# Patient Record
Sex: Female | Born: 1945 | Race: White | Hispanic: No | Smoking: Current every day smoker
Health system: Southern US, Community
[De-identification: ages and names within clinical notes are randomized; demographics above are authoritative.]

## PROBLEM LIST (undated history)

## (undated) DIAGNOSIS — G709 Myoneural disorder, unspecified: Secondary | ICD-10-CM

## (undated) DIAGNOSIS — R0600 Dyspnea, unspecified: Secondary | ICD-10-CM

## (undated) DIAGNOSIS — I2510 Atherosclerotic heart disease of native coronary artery without angina pectoris: Secondary | ICD-10-CM

## (undated) DIAGNOSIS — R7302 Impaired glucose tolerance (oral): Secondary | ICD-10-CM

## (undated) DIAGNOSIS — I219 Acute myocardial infarction, unspecified: Secondary | ICD-10-CM

## (undated) DIAGNOSIS — I10 Essential (primary) hypertension: Secondary | ICD-10-CM

## (undated) DIAGNOSIS — G35 Multiple sclerosis: Secondary | ICD-10-CM

## (undated) DIAGNOSIS — K5090 Crohn's disease, unspecified, without complications: Secondary | ICD-10-CM

## (undated) DIAGNOSIS — I639 Cerebral infarction, unspecified: Secondary | ICD-10-CM

## (undated) DIAGNOSIS — K219 Gastro-esophageal reflux disease without esophagitis: Secondary | ICD-10-CM

## (undated) DIAGNOSIS — J449 Chronic obstructive pulmonary disease, unspecified: Secondary | ICD-10-CM

## (undated) DIAGNOSIS — E7800 Pure hypercholesterolemia, unspecified: Secondary | ICD-10-CM

## (undated) DIAGNOSIS — Z720 Tobacco use: Secondary | ICD-10-CM

## (undated) DIAGNOSIS — E039 Hypothyroidism, unspecified: Secondary | ICD-10-CM

## (undated) DIAGNOSIS — C50919 Malignant neoplasm of unspecified site of unspecified female breast: Secondary | ICD-10-CM

## (undated) DIAGNOSIS — J45909 Unspecified asthma, uncomplicated: Secondary | ICD-10-CM

## (undated) DIAGNOSIS — E0590 Thyrotoxicosis, unspecified without thyrotoxic crisis or storm: Secondary | ICD-10-CM

## (undated) DIAGNOSIS — H409 Unspecified glaucoma: Secondary | ICD-10-CM

## (undated) HISTORY — DX: Cerebral infarction, unspecified: I63.9

## (undated) HISTORY — DX: Gastro-esophageal reflux disease without esophagitis: K21.9

## (undated) HISTORY — DX: Unspecified asthma, uncomplicated: J45.909

## (undated) HISTORY — DX: Thyrotoxicosis, unspecified without thyrotoxic crisis or storm: E05.90

## (undated) HISTORY — DX: Crohn's disease, unspecified, without complications: K50.90

## (undated) HISTORY — DX: Multiple sclerosis: G35

## (undated) HISTORY — DX: Unspecified glaucoma: H40.9

## (undated) HISTORY — DX: Pure hypercholesterolemia, unspecified: E78.00

## (undated) HISTORY — PX: CORONARY ANGIOPLASTY: SHX604

## (undated) HISTORY — PX: AUGMENTATION MAMMAPLASTY: SUR837

---
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
## 1977-05-16 HISTORY — PX: RIGHT OOPHORECTOMY: SHX2359

## 1979-05-17 HISTORY — PX: BREAST ENHANCEMENT SURGERY: SHX7

## 2004-07-07 ENCOUNTER — Ambulatory Visit: Payer: Self-pay | Admitting: Internal Medicine

## 2004-07-09 ENCOUNTER — Ambulatory Visit: Payer: Self-pay | Admitting: Internal Medicine

## 2005-05-25 ENCOUNTER — Ambulatory Visit: Payer: Self-pay | Admitting: Internal Medicine

## 2005-09-05 ENCOUNTER — Ambulatory Visit: Payer: Self-pay | Admitting: Unknown Physician Specialty

## 2007-05-09 ENCOUNTER — Ambulatory Visit: Payer: Self-pay | Admitting: Internal Medicine

## 2008-05-16 HISTORY — PX: COLONOSCOPY: SHX174

## 2008-12-24 ENCOUNTER — Ambulatory Visit: Payer: Self-pay | Admitting: Internal Medicine

## 2008-12-24 LAB — HM MAMMOGRAPHY

## 2009-11-13 ENCOUNTER — Ambulatory Visit: Payer: Self-pay | Admitting: Internal Medicine

## 2010-11-16 LAB — BASIC METABOLIC PANEL
BUN: 10 mg/dL (ref 4–21)
Glucose: 104 mg/dL

## 2010-11-16 LAB — CBC AND DIFFERENTIAL: HCT: 38 % (ref 36–46)

## 2010-11-16 LAB — HEPATIC FUNCTION PANEL: Alkaline Phosphatase: 84 U/L (ref 25–125)

## 2011-01-21 ENCOUNTER — Ambulatory Visit: Payer: Self-pay | Admitting: Internal Medicine

## 2012-04-25 ENCOUNTER — Telehealth: Payer: Self-pay | Admitting: Internal Medicine

## 2012-04-25 NOTE — Telephone Encounter (Signed)
Refill Levothyroxine 0.125 mcg. Called into [address].

## 2012-04-26 MED ORDER — LEVOTHYROXINE SODIUM 125 MCG PO TABS: 125.0000 ug | ORAL_TABLET | Freq: Every day | ORAL | Status: DC

## 2012-04-26 NOTE — Telephone Encounter (Signed)
Called prescription in to pharmacy 

## 2012-05-16 DIAGNOSIS — C50919 Malignant neoplasm of unspecified site of unspecified female breast: Secondary | ICD-10-CM

## 2012-05-16 HISTORY — DX: Malignant neoplasm of unspecified site of unspecified female breast: C50.919

## 2012-05-16 HISTORY — PX: MASTECTOMY: SHX3

## 2012-06-13 ENCOUNTER — Encounter: Payer: Self-pay | Admitting: *Deleted

## 2012-06-15 ENCOUNTER — Ambulatory Visit (INDEPENDENT_AMBULATORY_CARE_PROVIDER_SITE_OTHER): Payer: Self-pay | Admitting: Internal Medicine

## 2012-06-15 ENCOUNTER — Encounter: Payer: Self-pay | Admitting: Internal Medicine

## 2012-06-15 VITALS — BP 150/76 | HR 104 | Temp 98.6°F | Ht 67.0 in | Wt 219.8 lb

## 2012-06-15 DIAGNOSIS — L408 Other psoriasis: Secondary | ICD-10-CM

## 2012-06-15 DIAGNOSIS — R5381 Other malaise: Secondary | ICD-10-CM

## 2012-06-15 DIAGNOSIS — E7800 Pure hypercholesterolemia, unspecified: Secondary | ICD-10-CM

## 2012-06-15 DIAGNOSIS — G35 Multiple sclerosis: Secondary | ICD-10-CM

## 2012-06-15 DIAGNOSIS — L409 Psoriasis, unspecified: Secondary | ICD-10-CM

## 2012-06-15 DIAGNOSIS — H409 Unspecified glaucoma: Secondary | ICD-10-CM

## 2012-06-15 DIAGNOSIS — I10 Essential (primary) hypertension: Secondary | ICD-10-CM

## 2012-06-15 DIAGNOSIS — R5383 Other fatigue: Secondary | ICD-10-CM

## 2012-06-15 DIAGNOSIS — J45909 Unspecified asthma, uncomplicated: Secondary | ICD-10-CM

## 2012-06-15 DIAGNOSIS — R300 Dysuria: Secondary | ICD-10-CM

## 2012-06-15 DIAGNOSIS — E039 Hypothyroidism, unspecified: Secondary | ICD-10-CM

## 2012-06-15 DIAGNOSIS — G35D Multiple sclerosis, unspecified: Secondary | ICD-10-CM

## 2012-06-15 LAB — POCT URINALYSIS DIPSTICK
Bilirubin, UA: NEGATIVE
Blood, UA: NEGATIVE
Glucose, UA: NEGATIVE
Spec Grav, UA: 1.01
Urobilinogen, UA: 0.2
pH, UA: 6

## 2012-06-15 MED ORDER — ALBUTEROL SULFATE HFA 108 (90 BASE) MCG/ACT IN AERS: 2.0000 | INHALATION_SPRAY | Freq: Four times a day (QID) | RESPIRATORY_TRACT | Status: DC | PRN

## 2012-06-15 MED ORDER — LISINOPRIL 10 MG PO TABS: 10.0000 mg | ORAL_TABLET | Freq: Every day | ORAL | Status: DC

## 2012-06-15 MED ORDER — FLUTICASONE-SALMETEROL 250-50 MCG/DOSE IN AEPB: 1.0000 | INHALATION_SPRAY | Freq: Two times a day (BID) | RESPIRATORY_TRACT | Status: DC

## 2012-06-16 ENCOUNTER — Encounter: Payer: Self-pay | Admitting: Internal Medicine

## 2012-06-16 DIAGNOSIS — E7800 Pure hypercholesterolemia, unspecified: Secondary | ICD-10-CM | POA: Insufficient documentation

## 2012-06-16 DIAGNOSIS — H409 Unspecified glaucoma: Secondary | ICD-10-CM | POA: Insufficient documentation

## 2012-06-16 DIAGNOSIS — I10 Essential (primary) hypertension: Secondary | ICD-10-CM | POA: Insufficient documentation

## 2012-06-16 DIAGNOSIS — G35D Multiple sclerosis, unspecified: Secondary | ICD-10-CM | POA: Insufficient documentation

## 2012-06-16 DIAGNOSIS — J45909 Unspecified asthma, uncomplicated: Secondary | ICD-10-CM | POA: Insufficient documentation

## 2012-06-16 DIAGNOSIS — E039 Hypothyroidism, unspecified: Secondary | ICD-10-CM | POA: Insufficient documentation

## 2012-06-16 DIAGNOSIS — L409 Psoriasis, unspecified: Secondary | ICD-10-CM | POA: Insufficient documentation

## 2012-06-16 DIAGNOSIS — G35 Multiple sclerosis: Secondary | ICD-10-CM | POA: Insufficient documentation

## 2012-06-16 NOTE — Assessment & Plan Note (Signed)
Uses clobetosol.  Follow.

## 2012-06-16 NOTE — Assessment & Plan Note (Signed)
Followed by opthalmology.

## 2012-06-16 NOTE — Assessment & Plan Note (Signed)
Continue current inhaler regimen.  Needs to stop smoking.  Breathing stable.

## 2012-06-16 NOTE — Progress Notes (Signed)
Subjective:    Patient ID: Haley Santos, female    DOB: 1945-11-25, 67 y.o.   MRN: [id number]  HPI 67 year old female with past history of asthma, tobacco abuse, hypercholesterolemia, hyperthyroidism s/p ablation and now maintained on synthroid and multiple sclerosis.  She comes in today for a scheduled follow up.  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  Enjoys both of these.  Taking some vitamin supplements.  Feels better since being on these.  Still reports increased fatigue.  Started smoking again - 11/13.  She states that her MS is progressing.  Seeing Dr Dawna Part for her eye changes.  Some unsteadiness.  Decreased energy and decreased strength.  Hair is thinning.  States she had a bladder infection 10/13.  Has been taking cranberry pills and drinking cranberry juice.  Still has a sensitive sensation and some increased frequency.  Still having issues with her psoriasis.  Request a refill on clobetasol.     Past Medical History  Diagnosis Date  . Asthma with bronchitis   . Crohn's disease 1970  . Multiple sclerosis   . Hyperthyroidism     s/p ablation maintained on synthroid  . GERD (gastroesophageal reflux disease)   . Glaucoma   . Hypercholesterolemia     Current Outpatient Prescriptions on File Prior to Visit  Medication Sig Dispense Refill  . acetaminophen (TYLENOL) 325 MG tablet Take 325 mg by mouth as needed.      Marland Kitchen albuterol (PROAIR HFA) 108 (90 BASE) MCG/ACT inhaler Inhale 2 puffs into the lungs every 6 (six) hours as needed.  1 Inhaler  3  . aspirin 81 MG tablet Take 81 mg by mouth daily.      . Fluticasone-Salmeterol (ADVAIR) 250-50 MCG/DOSE AEPB Inhale 1 puff into the lungs every 12 (twelve) hours.  60 each  3  . levothyroxine (SYNTHROID, LEVOTHROID) 125 MCG tablet Take 1 tablet (125 mcg total) by mouth daily.  90 tablet  0  . lisinopril (PRINIVIL,ZESTRIL) 10 MG tablet Take 1 tablet (10 mg total) by mouth daily.  90 tablet  1  . omeprazole (PRILOSEC  OTC) 20 MG tablet Take 20 mg by mouth daily.      . travoprost, benzalkonium, (TRAVATAN) 0.004 % ophthalmic solution 1 drop at bedtime.      Marland Kitchen FLUoxetine (PROZAC) 10 MG capsule Take 10 mg by mouth daily.        Review of Systems Patient denies any headache, lightheadedness or dizziness. No significant sinus or allergy symptoms.  No chest pain, tightness or palpitations.  No increased shortness of breath.  No significant cough or congestion.  No nausea or vomiting.  No acid reflux.  No abdominal pain or cramping.  No bowel change, such as diarrhea, constipation, BRBPR or melana.  Urinary symptoms as outlined.  She did report not having her lisinopril for the last several days.  Fatigue as outlined.         Objective:   Physical Exam Filed Vitals:   06/15/12 0856  BP: 150/76  Pulse: 104  Temp: 98.6 F (37 C)   Blood pressure recheck:  70/76  67 year old female in no acute distress.   HEENT:  Nares- clear.  Oropharynx - without lesions. NECK:  Supple.  Nontender.  No audible bruit.  HEART:  Appears to be regular. LUNGS:  No crackles or wheezing audible.  Respirations even and unlabored.  RADIAL PULSE:  Equal bilaterally.  ABDOMEN:  Soft, nontender.  Bowel sounds present  and normal.  No audible abdominal bruit.    EXTREMITIES:  No increased edema present.  DP pulses palpable and equal bilaterally.          Assessment & Plan:  POSSIBLE UTI.  Check urinalysis.    FATIGUE.  Probably multifactorial.  See above.  Check cbc, met c and tsh.   SMOKING CESSATION.  Discussed the need to quit.  Follow.   HEALTH MAINTENANCE.  Schedule a physical at next visit.  Obtain outside records to review.  She declines mammogram.

## 2012-06-16 NOTE — Assessment & Plan Note (Signed)
Feels progressing.  See above.  Declines any further evaluation or treatment.

## 2012-06-16 NOTE — Assessment & Plan Note (Signed)
On thyroid supplementation.  Check tsh.

## 2012-06-16 NOTE — Assessment & Plan Note (Signed)
On lisinopril.  Has not had her medicine in several days.  Restart.  Follow pressures and send in.  Hold on making adjustments in her medication regimen.  Check metabolic panel.

## 2012-06-16 NOTE — Assessment & Plan Note (Signed)
Declines medication.  Low cholesterol diet and exercise.  Check lipid panel.

## 2012-06-22 ENCOUNTER — Other Ambulatory Visit: Payer: Self-pay

## 2012-06-26 ENCOUNTER — Other Ambulatory Visit: Payer: Self-pay

## 2012-07-17 ENCOUNTER — Ambulatory Visit (INDEPENDENT_AMBULATORY_CARE_PROVIDER_SITE_OTHER): Payer: BC Managed Care – PPO | Admitting: Adult Health

## 2012-07-17 ENCOUNTER — Encounter: Payer: Self-pay | Admitting: Adult Health

## 2012-07-17 ENCOUNTER — Telehealth: Payer: Self-pay | Admitting: Internal Medicine

## 2012-07-17 VITALS — BP 144/73 | HR 93 | Temp 98.0°F | Resp 16 | Ht 66.0 in | Wt 219.0 lb

## 2012-07-17 MED ORDER — PREDNISONE (PAK) 10 MG PO TABS: 10.0000 mg | ORAL_TABLET | Freq: Every day | ORAL | Status: DC

## 2012-07-17 MED ORDER — AMOXICILLIN-POT CLAVULANATE 875-125 MG PO TABS: 1.0000 | ORAL_TABLET | Freq: Two times a day (BID) | ORAL | Status: DC

## 2012-07-17 NOTE — Patient Instructions (Addendum)
  Please start your antibiotic today. Also start your prednisone in the morning.  You can use the flonase 1 spray in each nostril twice a day.  Call if you are not better within 3-4 days.

## 2012-07-17 NOTE — Assessment & Plan Note (Signed)
Suspect bacterial in origin. Her symptoms are further aggravated with her smoking. I have counseled her on smoking cessation; however, she is not ready to give this up. Will start Augmentin for 7 days. I have also ordered a prednisone taper starting with 60 mg and tapering by 10 mg until complete.

## 2012-07-17 NOTE — Progress Notes (Signed)
  Subjective:    Patient ID: Haley Santos, female    DOB: 01-13-1946, 67 y.o.   MRN: [id number]  HPI  Patient is a 67 year old female who presents to clinic with complaints of upper respiratory infection s/s which have progressed. Her symptoms started with sneezing, cough. She had some nausea and several episodes of vomiting. Now with sinus pressure, congestion and drainage which is green. She reports her breathing has become slightly impaired. She denies any fever or chills. Patient is currently smoking one pack per day.   Current Outpatient Prescriptions on File Prior to Visit  Medication Sig Dispense Refill  . albuterol (PROAIR HFA) 108 (90 BASE) MCG/ACT inhaler Inhale 2 puffs into the lungs every 6 (six) hours as needed.  1 Inhaler  3  . aspirin 81 MG tablet Take 81 mg by mouth daily.      . Fluticasone-Salmeterol (ADVAIR) 250-50 MCG/DOSE AEPB Inhale 1 puff into the lungs every 12 (twelve) hours.  60 each  3  . levothyroxine (SYNTHROID, LEVOTHROID) 125 MCG tablet Take 1 tablet (125 mcg total) by mouth daily.  90 tablet  0  . lisinopril (PRINIVIL,ZESTRIL) 10 MG tablet Take 1 tablet (10 mg total) by mouth daily.  90 tablet  1  . omeprazole (PRILOSEC OTC) 20 MG tablet Take 20 mg by mouth daily.      . travoprost, benzalkonium, (TRAVATAN) 0.004 % ophthalmic solution 1 drop at bedtime.      Marland Kitchen acetaminophen (TYLENOL) 325 MG tablet Take 325 mg by mouth as needed.       No current facility-administered medications on file prior to visit.      Review of Systems  Constitutional: Negative for fever, chills and appetite change.  HENT: Positive for congestion, voice change, postnasal drip and sinus pressure.   Respiratory: Positive for cough, chest tightness, shortness of breath and wheezing.   Cardiovascular: Negative for chest pain.  Gastrointestinal: Negative for nausea and vomiting.    BP 144/73  Pulse 93  Temp(Src) 98 F (36.7 C)  Resp 16  Ht 5\' 6"  (1.676 m)  Wt 219 lb (99.338 kg)   BMI 35.36 kg/m2  SpO2 93%  LMP 06/15/1977     Objective:   Physical Exam  Constitutional: She is oriented to person, place, and time. She appears well-developed and well-nourished.  Cardiovascular: Normal rate, regular rhythm and normal heart sounds.   Pulmonary/Chest: Effort normal and breath sounds normal. She has no wheezes. She has no rales.  Lymphadenopathy:    She has no cervical adenopathy.  Neurological: She is alert and oriented to person, place, and time.  Skin: Skin is warm and dry.  Psychiatric: She has a normal mood and affect. Her behavior is normal. Judgment and thought content normal.        Assessment & Plan:

## 2012-07-17 NOTE — Telephone Encounter (Signed)
Patient Information:  Caller Name: Chava  Phone: [phone]  Patient: Haley Santos, Haley Santos  Gender: Female  DOB: 04-09-1946  Age: 67 Years  PCP: Einar Pheasant  Office Follow Up:  Does the office need to follow up with this patient?: No  Instructions For The Office: N/A  RN Note:  Patient states she has cough, sinus pain and pressure, along with chest congestion.  States usually has augmentin for treatment and steroids.  Afebrile.  Denies wheezing, asthma flare, or other emergent symptoms per protocol; advised being seen today in office.  Appt scheduled 1615 07/17/12 with Raquel Rey.  krs/can  Symptoms  Reason For Call & Symptoms: sinus infection, possible bronchitis  Reviewed Health History In EMR: Yes  Reviewed Medications In EMR: Yes  Reviewed Allergies In EMR: Yes  Reviewed Surgeries / Procedures: Yes  Date of Onset of Symptoms: 07/11/2012  Guideline(s) Used:  Cough  Disposition Per Guideline:   See Today in Office  Reason For Disposition Reached:   Sinus pain persists after using nasal washes and pain medicine > 24 hours  Advice Given:  N/A  Appointment Scheduled:  07/17/2012 16:15:00 Appointment Scheduled Provider:  Charolette Forward

## 2012-08-01 ENCOUNTER — Telehealth: Payer: Self-pay | Admitting: *Deleted

## 2012-08-01 NOTE — Telephone Encounter (Signed)
Patient mailed in handicap tag form for Dr. Lorin Picket to fill out. Dr. Lorin Picket completed and I mailed back to patient per her request.

## 2012-08-06 ENCOUNTER — Ambulatory Visit (INDEPENDENT_AMBULATORY_CARE_PROVIDER_SITE_OTHER): Payer: BC Managed Care – PPO | Admitting: Internal Medicine

## 2012-08-06 ENCOUNTER — Encounter: Payer: Self-pay | Admitting: Internal Medicine

## 2012-08-06 VITALS — BP 130/70 | HR 91 | Temp 98.0°F | Ht 67.0 in | Wt 218.0 lb

## 2012-08-06 DIAGNOSIS — E7800 Pure hypercholesterolemia, unspecified: Secondary | ICD-10-CM

## 2012-08-06 DIAGNOSIS — E039 Hypothyroidism, unspecified: Secondary | ICD-10-CM

## 2012-08-06 DIAGNOSIS — J329 Chronic sinusitis, unspecified: Secondary | ICD-10-CM

## 2012-08-06 DIAGNOSIS — I10 Essential (primary) hypertension: Secondary | ICD-10-CM

## 2012-08-06 DIAGNOSIS — H409 Unspecified glaucoma: Secondary | ICD-10-CM

## 2012-08-06 DIAGNOSIS — G35 Multiple sclerosis: Secondary | ICD-10-CM

## 2012-08-06 DIAGNOSIS — D649 Anemia, unspecified: Secondary | ICD-10-CM

## 2012-08-06 DIAGNOSIS — G4710 Hypersomnia, unspecified: Secondary | ICD-10-CM

## 2012-08-06 DIAGNOSIS — R400 Somnolence: Secondary | ICD-10-CM

## 2012-08-06 DIAGNOSIS — J45909 Unspecified asthma, uncomplicated: Secondary | ICD-10-CM

## 2012-08-06 LAB — CBC WITH DIFFERENTIAL/PLATELET
Basophils Relative: 0.4 % (ref 0.0–3.0)
HCT: 34.1 % — ABNORMAL LOW (ref 36.0–46.0)
Hemoglobin: 11.7 g/dL — ABNORMAL LOW (ref 12.0–15.0)
Lymphocytes Relative: 18.4 % (ref 12.0–46.0)
MCHC: 34.3 g/dL (ref 30.0–36.0)
Monocytes Relative: 5 % (ref 3.0–12.0)
Neutro Abs: 4.5 10*3/uL (ref 1.4–7.7)
WBC: 6.3 10*3/uL (ref 4.5–10.5)

## 2012-08-06 MED ORDER — AMOXICILLIN-POT CLAVULANATE 875-125 MG PO TABS: 1.0000 | ORAL_TABLET | Freq: Two times a day (BID) | ORAL | Status: DC

## 2012-08-07 ENCOUNTER — Encounter: Payer: Self-pay | Admitting: Internal Medicine

## 2012-08-07 ENCOUNTER — Other Ambulatory Visit: Payer: Self-pay | Admitting: Internal Medicine

## 2012-08-07 DIAGNOSIS — D649 Anemia, unspecified: Secondary | ICD-10-CM

## 2012-08-07 NOTE — Progress Notes (Signed)
Subjective:    Patient ID: Haley Santos, female    DOB: 12-Apr-1946, 67 y.o.   MRN: [id number]  HPI 67 year old female with past history of asthma, tobacco abuse, hypercholesterolemia, hyperthyroidism s/p ablation and now maintained on synthroid and multiple sclerosis.  She comes in today as a work in for an acute care follow up.  She was seen on 07/31/12 for light headedness and dizziness.  States she feels more unsteady on her feet.  This has been going on for a while.  She relates some of her symptoms to MS.  She also recently has been treated for a sinus infection. states she is not over this.  Still with increased nasal/head congestion.  Bloody mucus (nasal).  Increased throat congestion.  Cough productive - thick mucus.  No deep chest congestion or tightness.  She also reports that she is sleepy.  Cannot stay awake through the day.  Increased fatigue.  She was found to be anemic (new onset) at acute care.  Started on iron.  Energy is some better.  No increased acid reflux.  No bowel change.     Past Medical History  Diagnosis Date  . Asthma with bronchitis   . Crohn's disease 1970  . Multiple sclerosis   . Hyperthyroidism     s/p ablation maintained on synthroid  . GERD (gastroesophageal reflux disease)   . Glaucoma   . Hypercholesterolemia     Current Outpatient Prescriptions on File Prior to Visit  Medication Sig Dispense Refill  . albuterol (PROAIR HFA) 108 (90 BASE) MCG/ACT inhaler Inhale 2 puffs into the lungs every 6 (six) hours as needed.  1 Inhaler  3  . Fluticasone-Salmeterol (ADVAIR) 250-50 MCG/DOSE AEPB Inhale 1 puff into the lungs every 12 (twelve) hours.  60 each  3  . levothyroxine (SYNTHROID, LEVOTHROID) 125 MCG tablet Take 1 tablet (125 mcg total) by mouth daily.  90 tablet  0  . lisinopril (PRINIVIL,ZESTRIL) 10 MG tablet Take 1 tablet (10 mg total) by mouth daily.  90 tablet  1  . omeprazole (PRILOSEC OTC) 20 MG tablet Take 20 mg by mouth daily.      . travoprost,  benzalkonium, (TRAVATAN) 0.004 % ophthalmic solution 1 drop at bedtime.      Marland Kitchen aspirin 81 MG tablet Take 81 mg by mouth daily.      . fluticasone (FLONASE) 50 MCG/ACT nasal spray Place 2 sprays into the nose daily.      . predniSONE (STERAPRED UNI-PAK) 10 MG tablet Take 1 tablet (10 mg total) by mouth daily. Take 60 mg on the first day then taper by 10 mg daily until complete  21 tablet  0   No current facility-administered medications on file prior to visit.    Review of Systems Patient denies any headache or dizziness. She did report some previous light headedness.  Still with increased sinus/nasal congestion with productive mucus.  See above.  No chest pain, tightness or palpitations.  No increased shortness of breath.  Some cough.  No nausea or vomiting.  No acid reflux. No abdominal pain or cramping.  No bowel change, such as diarrhea, constipation, BRBPR or melana.  Unsteadiness - as outlined.  Not new.  Has MS.  Has declined any form of evaluation or treatment for her MS.         Objective:   Physical Exam  Filed Vitals:   08/06/12 0851  BP: 130/70  Pulse: 91  Temp: 98 F (36.7 C)   Blood  pressure recheck:  59/76  66 year old female in no acute distress.   HEENT:  Nares- slightly erythematous turbinates.  Minimal tenderness to palpation over the frontal sinus.  TMs visualized - without erythema.   Oropharynx - without lesions. NECK:  Supple.  Nontender.   HEART:  Appears to be regular. LUNGS:  No crackles or wheezing audible.  Respirations even and unlabored.  No increased wheezing with forced expiration.   RADIAL PULSE:  Equal bilaterally.  ABDOMEN:  Soft, nontender.  Bowel sounds present and normal.  No audible abdominal bruit.    EXTREMITIES:  No increased edema present.  DP pulses palpable and equal bilaterally.          Assessment & Plan:  SINUSITIS.  Improved, but persistent symptoms.  Will continue augmentin 865m bid x 1 more week.  Use Flonase and saline nasal  spray as directed.  Continue inhalers as directed.  Notify me or be reevaluated if symptoms persist.    POSSIBLE SLEEP APNEA.  Describes the daytime somnolence as outlined.  Increased fatigue.  Hypertensive.  Schedule a split night sleep study.  Avoid sedating medications.    ANEMIA.  New finding for her.  No bowel change and no upper symptoms.  On iron now.  Started at acute care.  Recheck cbc/ferritin today.  Refer to GI for evaluation and further w/up - for the anemia.    LIGHT HEADEDNESS.  Probably multifactorial.  Will treat the sinus infection as outlined.  On iron now for the anemia.  Has MS.  Declines further w/up or treatment.  Declines scan, referral, etc.  Will notify me if she changes her mind.    SMOKING CESSATION.  Have discussed the need to quit.  Follow.   HEALTH MAINTENANCE.  Scheduled for a physical at next visit.  She declines mammogram.

## 2012-08-07 NOTE — Progress Notes (Signed)
Order placed for follow up labs

## 2012-08-07 NOTE — Assessment & Plan Note (Signed)
On thyroid supplementation.  TSH just checked 07/31/12 - within normal limits.

## 2012-08-07 NOTE — Assessment & Plan Note (Signed)
Followed by opthalmology.

## 2012-08-07 NOTE — Assessment & Plan Note (Signed)
Declines medication.  Low cholesterol diet and exercise.  Desires for me not to check her cholesterol.

## 2012-08-07 NOTE — Assessment & Plan Note (Signed)
Extend abx (augmentin) for another week.  Use the Flonase and saline nasal spray as outlined.  Mucinex as outlined.  Continue inhalers.  Follow.

## 2012-08-07 NOTE — Assessment & Plan Note (Signed)
On lisinopril.  Pressure a little elevated today.  Hold on making changes. Follow pressures.

## 2012-08-07 NOTE — Assessment & Plan Note (Signed)
Feels progressing.  See note as outlined.   Declines any further evaluation or treatment.

## 2012-08-07 NOTE — Assessment & Plan Note (Signed)
Continue current inhaler regimen.  Needs to stop smoking.  Breathing stable.  Treat the sinus infection as outlined.

## 2012-08-17 ENCOUNTER — Other Ambulatory Visit: Payer: Self-pay | Admitting: *Deleted

## 2012-08-18 ENCOUNTER — Telehealth: Payer: Self-pay | Admitting: Internal Medicine

## 2012-08-18 MED ORDER — LEVOTHYROXINE SODIUM 125 MCG PO TABS: 125.0000 ug | ORAL_TABLET | Freq: Every day | ORAL | Status: DC

## 2012-08-18 NOTE — Telephone Encounter (Signed)
Called in refill for levothyroxine #90 with on refill.  Also ok'd to change manufacturer.  Called in to [facility] [address].  (pharmacy that requested refill)

## 2012-08-24 ENCOUNTER — Ambulatory Visit: Payer: Self-pay | Admitting: Internal Medicine

## 2012-08-28 ENCOUNTER — Telehealth: Payer: Self-pay | Admitting: *Deleted

## 2012-08-28 NOTE — Telephone Encounter (Signed)
Pt informed that Sleep Study showed no OSA

## 2012-09-12 ENCOUNTER — Other Ambulatory Visit (INDEPENDENT_AMBULATORY_CARE_PROVIDER_SITE_OTHER): Payer: BC Managed Care – PPO

## 2012-09-12 DIAGNOSIS — R5383 Other fatigue: Secondary | ICD-10-CM

## 2012-09-12 DIAGNOSIS — E7800 Pure hypercholesterolemia, unspecified: Secondary | ICD-10-CM

## 2012-09-12 DIAGNOSIS — R5381 Other malaise: Secondary | ICD-10-CM

## 2012-09-12 DIAGNOSIS — D649 Anemia, unspecified: Secondary | ICD-10-CM

## 2012-09-12 DIAGNOSIS — E039 Hypothyroidism, unspecified: Secondary | ICD-10-CM

## 2012-09-12 LAB — LIPID PANEL
HDL: 33.5 mg/dL — ABNORMAL LOW (ref >39.00)
VLDL: 31.2 mg/dL (ref 0.0–40.0)

## 2012-09-12 LAB — COMPREHENSIVE METABOLIC PANEL
ALT: 44 U/L — ABNORMAL HIGH (ref 0–35)
Albumin: 3.8 g/dL (ref 3.5–5.2)
CO2: 25 mEq/L (ref 19–32)
GFR: 75.48 mL/min (ref >60.00)
Glucose, Bld: 160 mg/dL — ABNORMAL HIGH (ref 70–99)
Potassium: 4.4 mEq/L (ref 3.5–5.1)
Sodium: 139 mEq/L (ref 135–145)
Total Protein: 7.1 g/dL (ref 6.0–8.3)

## 2012-09-12 LAB — CBC WITH DIFFERENTIAL/PLATELET
Basophils Absolute: 0 10*3/uL (ref 0.0–0.1)
Eosinophils Relative: 0.4 % (ref 0.0–5.0)
Lymphocytes Relative: 12 % (ref 12.0–46.0)
Monocytes Relative: 2.5 % — ABNORMAL LOW (ref 3.0–12.0)
Neutrophils Relative %: 84.8 % — ABNORMAL HIGH (ref 43.0–77.0)
Platelets: 231 10*3/uL (ref 150.0–400.0)
RDW: 17.4 % — ABNORMAL HIGH (ref 11.5–14.6)
WBC: 8.7 10*3/uL (ref 4.5–10.5)

## 2012-09-12 LAB — TSH: TSH: 2.04 u[IU]/mL (ref 0.35–5.50)

## 2012-09-12 LAB — FERRITIN: Ferritin: 34.1 ng/mL (ref 10.0–291.0)

## 2012-09-13 LAB — LDL CHOLESTEROL, DIRECT: Direct LDL: 205.3 mg/dL

## 2012-09-14 ENCOUNTER — Encounter: Payer: Self-pay | Admitting: *Deleted

## 2012-09-14 ENCOUNTER — Other Ambulatory Visit: Payer: Self-pay | Admitting: Internal Medicine

## 2012-09-14 DIAGNOSIS — R739 Hyperglycemia, unspecified: Secondary | ICD-10-CM

## 2012-09-14 DIAGNOSIS — R748 Abnormal levels of other serum enzymes: Secondary | ICD-10-CM

## 2012-09-14 NOTE — Progress Notes (Signed)
Order placed for f/u labs.  

## 2012-09-18 ENCOUNTER — Ambulatory Visit (INDEPENDENT_AMBULATORY_CARE_PROVIDER_SITE_OTHER): Payer: BC Managed Care – PPO | Admitting: Internal Medicine

## 2012-09-18 ENCOUNTER — Other Ambulatory Visit (INDEPENDENT_AMBULATORY_CARE_PROVIDER_SITE_OTHER): Payer: BC Managed Care – PPO

## 2012-09-18 ENCOUNTER — Encounter: Payer: Self-pay | Admitting: Internal Medicine

## 2012-09-18 VITALS — BP 130/70 | HR 94 | Temp 98.4°F | Ht 67.0 in | Wt 210.2 lb

## 2012-09-18 DIAGNOSIS — L408 Other psoriasis: Secondary | ICD-10-CM

## 2012-09-18 DIAGNOSIS — J45909 Unspecified asthma, uncomplicated: Secondary | ICD-10-CM

## 2012-09-18 DIAGNOSIS — R739 Hyperglycemia, unspecified: Secondary | ICD-10-CM

## 2012-09-18 DIAGNOSIS — R748 Abnormal levels of other serum enzymes: Secondary | ICD-10-CM

## 2012-09-18 DIAGNOSIS — E7800 Pure hypercholesterolemia, unspecified: Secondary | ICD-10-CM

## 2012-09-18 DIAGNOSIS — E039 Hypothyroidism, unspecified: Secondary | ICD-10-CM

## 2012-09-18 DIAGNOSIS — R7309 Other abnormal glucose: Secondary | ICD-10-CM

## 2012-09-18 DIAGNOSIS — N63 Unspecified lump in unspecified breast: Secondary | ICD-10-CM

## 2012-09-18 DIAGNOSIS — L409 Psoriasis, unspecified: Secondary | ICD-10-CM

## 2012-09-18 DIAGNOSIS — G35 Multiple sclerosis: Secondary | ICD-10-CM

## 2012-09-18 DIAGNOSIS — R928 Other abnormal and inconclusive findings on diagnostic imaging of breast: Secondary | ICD-10-CM

## 2012-09-18 DIAGNOSIS — I10 Essential (primary) hypertension: Secondary | ICD-10-CM

## 2012-09-18 DIAGNOSIS — H409 Unspecified glaucoma: Secondary | ICD-10-CM

## 2012-09-18 LAB — HEMOGLOBIN A1C: Hgb A1c MFr Bld: 6.6 % — ABNORMAL HIGH (ref 4.6–6.5)

## 2012-09-18 LAB — HEPATIC FUNCTION PANEL
ALT: 42 U/L — ABNORMAL HIGH (ref 0–35)
Bilirubin, Direct: 0.1 mg/dL (ref 0.0–0.3)
Total Bilirubin: 0.9 mg/dL (ref 0.3–1.2)

## 2012-09-18 MED ORDER — FERROUS FUMARATE 325 (106 FE) MG PO TABS: 1.0000 | ORAL_TABLET | Freq: Every day | ORAL | Status: DC

## 2012-09-18 NOTE — Progress Notes (Signed)
Subjective:    Patient ID: Haley Santos, female    DOB: Mar 21, 1946, 67 y.o.   MRN: [id number]  HPI 67 year old female with past history of asthma, tobacco abuse, hypercholesterolemia, hyperthyroidism s/p ablation and now maintained on synthroid and multiple sclerosis.  She comes in today to follow up on these issues as well as for a complete physical exam.  She feels her breathing is back to baseline.  No increased cough or congestion now.  Sinus symptoms better.  No chest pain or tightness.  No nausea or vomiting.  No bowel change.  Recently saw GI for iron deficient anemia.  Discussed with them her desire to do a set of hemoccult cards and then decide about the colonoscopy.  She reports she has noticed a lump in her right breast.  Present now for three weeks.  She feels it may be getting smaller.  No nipple discharge.  We discussed her MS.  See last note for details.  Question of progression.  Has declined further w/up and treatment.    Past Medical History  Diagnosis Date  . Asthma with bronchitis   . Crohn's disease 1970  . Multiple sclerosis   . Hyperthyroidism     s/p ablation maintained on synthroid  . GERD (gastroesophageal reflux disease)   . Glaucoma   . Hypercholesterolemia     Current Outpatient Prescriptions on File Prior to Visit  Medication Sig Dispense Refill  . albuterol (PROAIR HFA) 108 (90 BASE) MCG/ACT inhaler Inhale 2 puffs into the lungs every 6 (six) hours as needed.  1 Inhaler  3  . aspirin 81 MG tablet Take 81 mg by mouth daily.      . Cyanocobalamin (VITAMIN B 12 PO) Take by mouth daily.      . Fluticasone-Salmeterol (ADVAIR) 250-50 MCG/DOSE AEPB Inhale 1 puff into the lungs every 12 (twelve) hours.  60 each  3  . levothyroxine (SYNTHROID, LEVOTHROID) 125 MCG tablet Take 1 tablet (125 mcg total) by mouth daily.  90 tablet  1  . lisinopril (PRINIVIL,ZESTRIL) 10 MG tablet Take 1 tablet (10 mg total) by mouth daily.  90 tablet  1  . magnesium 30 MG tablet Take 30  mg by mouth daily.      Marland Kitchen omeprazole (PRILOSEC OTC) 20 MG tablet Take 20 mg by mouth daily.      . travoprost, benzalkonium, (TRAVATAN) 0.004 % ophthalmic solution 1 drop at bedtime.      . fluticasone (FLONASE) 50 MCG/ACT nasal spray Place 2 sprays into the nose daily.       No current facility-administered medications on file prior to visit.    Review of Systems Patient denies any headache or dizziness. She did report some previous light headedness.  See last note for details.  No chest pain, tightness or palpitations.  No increased shortness of breath.  No significant cough.  Still smoking.  Discussed with her today regarding the need to stop smoking.  Discussed multiple treatment options.  No nausea or vomiting.  No abdominal pain or cramping.  No bowel change, such as diarrhea, constipation, BRBPR or melana.  Has MS.  Has declined any form of evaluation or treatment for her MS.  Right breast lump as outlined.       Objective:   Physical Exam  Filed Vitals:   09/18/12 1002  BP: 130/70  Pulse: 94  Temp: 98.4 F (57.71 C)   67 year old female in no acute distress.   HEENT:  Nares-  clear.  Oropharynx - without lesions. NECK:  Supple.  Nontender.  No audible bruit.  HEART:  Appears to be regular. LUNGS:  No crackles or wheezing audible.  Respirations even and unlabored.  RADIAL PULSE:  Equal bilaterally.    BREASTS:  No nipple discharge or nipple retraction present.  Right breast fullness 12:00-1:00.  No axillary adenopathy appreciated.  ABDOMEN:  Soft, nontender.  Bowel sounds present and normal.  No audible abdominal bruit.  GU:  She declined.  RECTAL:  She declined.    EXTREMITIES:  No increased edema present.  DP pulses palpable and equal bilaterally.          Assessment & Plan:  SINUSITIS.  Resolved.   POSSIBLE SLEEP APNEA.  Sleep study does not meet criteria for obstructive sleep apnea.  We did discuss not sleeping supine.     ANEMIA.  No bowel change and no upper  symptoms.  On iron now.  Most recent cbc wnl.  Saw GI.  Returned hemoccult cards.  F/u with GI planned.     LIGHT HEADEDNESS.  Probably multifactorial.   Has MS.  Has declined further w/up or treatment.  With intermittent headaches.  Discussed MRI.  She will think about this and notify me if agreeable.    SMOKING CESSATION.  Have discussed the need to quit. I again discussed with her today.  I also discussed treatment options.    HEALTH MAINTENANCE.  Physical today.  Schedule mammogram as outlined.  Seeing GI.

## 2012-09-19 ENCOUNTER — Telehealth: Payer: Self-pay | Admitting: Internal Medicine

## 2012-09-19 ENCOUNTER — Other Ambulatory Visit: Payer: Self-pay | Admitting: Internal Medicine

## 2012-09-19 ENCOUNTER — Ambulatory Visit: Payer: Self-pay | Admitting: Internal Medicine

## 2012-09-19 DIAGNOSIS — R945 Abnormal results of liver function studies: Secondary | ICD-10-CM

## 2012-09-19 DIAGNOSIS — R928 Other abnormal and inconclusive findings on diagnostic imaging of breast: Secondary | ICD-10-CM

## 2012-09-19 NOTE — Progress Notes (Signed)
Order placed for f/u liver test

## 2012-09-19 NOTE — Telephone Encounter (Signed)
Spoke with pt & informed her that the MRI would be with Contrast. Pt will call back to let us know which facility she would like to have it scheduled.

## 2012-09-19 NOTE — Telephone Encounter (Signed)
Pt called stated she found a big mri machine @ [facility].  Pt wanted to know if you were going to order the mri with contrast or without.    PT wanted to know this so she can call them back to get more information on the mri machine

## 2012-09-19 NOTE — Telephone Encounter (Signed)
Pt notified of mammo/us results - Birads 4 and need for referral to surgery for question of need for biopsy.  Prefers to see Dr Bary Castilla.  Order for referral made.  Pt also notified of her labs and need for abdominal ultrasound and repeat liver panel.  She wants to hol on any further testing at this time and get this sorted through with her breast.  She will call back for lab appt and to notify us when agreeable for ultrasound.  Also wants to hold on lifestyles referral.

## 2012-09-19 NOTE — Telephone Encounter (Signed)
I would like to do the mri with contrast.  Tell her to let us know where and we can get schedule.

## 2012-09-20 ENCOUNTER — Encounter: Payer: Self-pay | Admitting: Internal Medicine

## 2012-09-20 DIAGNOSIS — N63 Unspecified lump in unspecified breast: Secondary | ICD-10-CM | POA: Insufficient documentation

## 2012-09-20 NOTE — Assessment & Plan Note (Signed)
Uses clobetosol.  Follow.

## 2012-09-20 NOTE — Assessment & Plan Note (Signed)
Declines medication.  Low cholesterol diet and exercise.

## 2012-09-20 NOTE — Assessment & Plan Note (Signed)
Feels progressing.  See last note as outlined.   Declines any further evaluation or treatment at this time. Will notify me if she changes her mind.

## 2012-09-20 NOTE — Assessment & Plan Note (Signed)
Continue current inhaler regimen.  Needs to stop smoking.  Breathing stable.

## 2012-09-20 NOTE — Assessment & Plan Note (Signed)
On lisinopril.  Pressure ok.  Follow metabolic panel.

## 2012-09-20 NOTE — Assessment & Plan Note (Signed)
Right breast fullness and exam as outlined.  Will check mammogram and ultrasound.

## 2012-09-20 NOTE — Assessment & Plan Note (Signed)
Followed by opthalmology.

## 2012-09-20 NOTE — Assessment & Plan Note (Signed)
On thyroid supplementation.  Recent tsh wnl.

## 2012-09-28 ENCOUNTER — Other Ambulatory Visit: Payer: BC Managed Care – PPO

## 2012-10-03 ENCOUNTER — Other Ambulatory Visit: Payer: Self-pay

## 2012-10-03 ENCOUNTER — Ambulatory Visit (INDEPENDENT_AMBULATORY_CARE_PROVIDER_SITE_OTHER): Payer: BC Managed Care – PPO | Admitting: General Surgery

## 2012-10-03 ENCOUNTER — Encounter: Payer: Self-pay | Admitting: General Surgery

## 2012-10-03 VITALS — BP 152/70 | HR 80 | Resp 16 | Ht 66.0 in | Wt 208.0 lb

## 2012-10-03 DIAGNOSIS — N63 Unspecified lump in unspecified breast: Secondary | ICD-10-CM

## 2012-10-03 DIAGNOSIS — Z853 Personal history of malignant neoplasm of breast: Secondary | ICD-10-CM | POA: Insufficient documentation

## 2012-10-03 DIAGNOSIS — R928 Other abnormal and inconclusive findings on diagnostic imaging of breast: Secondary | ICD-10-CM

## 2012-10-03 NOTE — Progress Notes (Signed)
Patient ID: Haley Santos, female   DOB: [birth year], 67 y.o.   MRN: [id number]  Chief Complaint  Patient presents with  . Breast Problem    HPI Haley Santos is a 67 y.o. female here today for abnormal mammogran done on Sep 19 2012.  States she can feel the area of concern on the right breast and it is smaller than before and a little sore.  She first noticed the "knot" September 12 2012 while applying lotion.  States she has had a similar thing happen 42 years ago after the birth of her daughter and was anemic and placed on iron.  She started iron in March for anemia and was placed on iron and she had joint aches and the "lump" developed.  She decided to only take the iron once a week now.  Denies family history of breast cancer.  HPI  Past Medical History  Diagnosis Date  . Asthma with bronchitis   . Crohn's disease 1970  . Multiple sclerosis 1993  . Hyperthyroidism     s/p ablation maintained on synthroid  . GERD (gastroesophageal reflux disease)   . Glaucoma   . Hypercholesterolemia     Past Surgical History  Procedure Laterality Date  . Tonsillectomy and adenoidectomy  1957  . Right oophorectomy  1979  . Colonoscopy  2010    Dr Vira Agar  . Breast enhancement surgery  1981    [place]    Family History  Problem Relation Age of Onset  . Diabetes Mother   . Heart disease Mother     myocardial infarction  . Glaucoma Mother   . Graves' disease Mother   . Lung disease Father   . Heart disease      half sister  . Peripheral vascular disease      half sister  . Heart attack Mother 24    Social History History  Substance Use Topics  . Smoking status: Current Every Day Smoker  . Smokeless tobacco: Never Used  . Alcohol Use: Yes     Comment: rarely    Allergies  Allergen Reactions  . Clindamycin/Lincomycin   . Codeine   . Shellfish Allergy Nausea And Vomiting    Felt goofy headed  . Valium (Diazepam)     Current Outpatient Prescriptions  Medication Sig  Dispense Refill  . albuterol (PROAIR HFA) 108 (90 BASE) MCG/ACT inhaler Inhale 2 puffs into the lungs every 6 (six) hours as needed.  1 Inhaler  3  . aspirin 81 MG tablet Take 81 mg by mouth daily.      . cholecalciferol (VITAMIN D) 1000 UNITS tablet Take 2,000 Units by mouth daily.      . Cyanocobalamin (VITAMIN B 12 PO) Take by mouth daily.      . ferrous fumarate (HEMOCYTE - 106 MG FE) 325 (106 FE) MG TABS Take 1 tablet (106 mg of iron total) by mouth daily.  30 each  2  . fluticasone (FLONASE) 50 MCG/ACT nasal spray Place 2 sprays into the nose daily.      . Fluticasone-Salmeterol (ADVAIR) 250-50 MCG/DOSE AEPB Inhale 1 puff into the lungs every 12 (twelve) hours.  60 each  3  . levothyroxine (SYNTHROID, LEVOTHROID) 125 MCG tablet Take 1 tablet (125 mcg total) by mouth daily.  90 tablet  1  . lisinopril (PRINIVIL,ZESTRIL) 10 MG tablet Take 1 tablet (10 mg total) by mouth daily.  90 tablet  1  . magnesium 30 MG tablet Take 30 mg by mouth daily.      Marland Kitchen  mometasone (NASONEX) 50 MCG/ACT nasal spray Place 2 sprays into the nose as needed.      Marland Kitchen omeprazole (PRILOSEC OTC) 20 MG tablet Take 20 mg by mouth daily.      . travoprost, benzalkonium, (TRAVATAN) 0.004 % ophthalmic solution 1 drop at bedtime.       No current facility-administered medications for this visit.    Review of Systems Review of Systems  Constitutional: Negative.   Respiratory: Negative.   Cardiovascular: Negative.     Blood pressure 152/70, pulse 80, resp. rate 16, height 5' 6"  (1.676 m), weight 208 lb (94.348 kg), last menstrual period 06/15/1977.  Physical Exam Physical Exam  Constitutional: She is oriented to person, place, and time. She appears well-developed and well-nourished.  Cardiovascular: Normal rate and regular rhythm.   No lower leg edema  Pulmonary/Chest: Effort normal and breath sounds normal.  Abdominal: Soft.  Lymphadenopathy:    She has no cervical adenopathy.    She has no axillary adenopathy.   Neurological: She is alert and oriented to person, place, and time.  Skin: Skin is warm and dry.  2 cm thickening at 8 o'clock 6 CFN left breast. This area is fairly soft and may represent a ripple in the underlying implant. In the 12 o'clock position of the right breast, 5 CFN there is soft tender area right breast measuring about 3 cm in diameter. No overlying erythema, but mild tenderness with pressure.  Data Reviewed Bilateral mammograms dated 09/19/2012 were reviewed and compared to previous studies dated 12/24/2008. A new parenchymal densities noted in the upper aspect of the right breast. Lateral implants are identified. The left breast is unremarkable. Ultrasound examination of the right breast of the same date showed a 3.2 cm solid lesion with posterior shadowing. BI-[id number].  Ultrasound examination of the left breast showed a hypoechoic nodule measuring 0.5 x 0.7 x 0.9 cm at the 8:00 position 7 cm from the nipple. This appeared separate from the underlying envelope of the breast prosthesis. No acoustic shadowing or enhancement is appreciated. This is indeterminate.  Ultrasound examination of the right breast identified a 1.4 x 1.5 x 1.7 poorly defined spiculated hypoechoic mass with acoustic shadowing above the level of the implant.  The patient was amenable to vacuum biopsy. Initial skin prep was undertaken with alcohol followed by the injection of 10 cc of 0.5% Xylocaine with 0.25% Marcaine with 1 2000 units of epinephrine under ultrasound guidance to avoid the underlying implant. With initial passage of a 10-gauge Encor device the patient experienced significant pain and an additional 10 cc of 1% plain Xylocaine was instilled. Once the lesion had been transversed 3 core samples were obtained, with severe pain with each biopsy. The procedure was terminated at this point. A clip was not utilized as the dominant mass remained present. The skin defect was closed with benzoin and Steri-Strip.  Pain had abated by the end of the procedure. A Telfa and Tegaderm dressing was applied. Written instructions were provided for wound care.  Assessment    Right breast cancer until proven otherwise.    Plan    The patient was advised to be contacted when the pathology is available. If a benign result as reported she'll be encouraged to have formal excision of this area.  The left breast lesion will need to be addressed. She reported today that she would "never" have a vacuum biopsy in the future. If the right biopsy is indeed positive for malignancy will make arrangements for bilateral breast  MRI to assess the left breast lesion.         Robert Bellow 10/04/2012, 6:56 AM

## 2012-10-03 NOTE — Patient Instructions (Addendum)
CARE AFTER BREAST BIOPSY  1. Leave the dressing on that your doctor applied after surgery. It is waterproof. You may bathe, shower and/or swim. The dressing will probably remain intact until your return office visit. If the dressing comes off, you will see small strips of tape against your skin on the incision. Do not remove these strips.  2. You may want to use a gauze,cloth or similar protection in your bra to prevent rubbing against your dressing and incision. This is not necessary, but you may feel more comfortable doing so.  3. It is recommended that you wear a bra day and night to give support to the breast. This will prevent the weight of the breast from pulling on the incision.  4. Your breast will feel hard and lumpy under the incision. Do not be alarmed. This is the underlying stitching of tissue. Softening of this tissue will occur in time.  5. Make sure you call the office and schedule an appointment in one week after your surgery. The office phone number is [phone]. The [facility] at [facility] may have already done this for you.  6. You will notice about a week after your office visit that the strips of the tape on your incision will begin to loosen. These may then be removed.  7. Report to your doctor any of the following:  * Severe pain not relieved by your pain medication  *Redness of the incision  * Drainage from the incision  *Fever greater than 101 degrees  The patient is aware to call back for any questions or concerns. We will call with results of biopsy May need to have an MRI of breast

## 2012-10-04 ENCOUNTER — Encounter: Payer: Self-pay | Admitting: General Surgery

## 2012-10-04 ENCOUNTER — Ambulatory Visit (INDEPENDENT_AMBULATORY_CARE_PROVIDER_SITE_OTHER): Payer: BC Managed Care – PPO | Admitting: General Surgery

## 2012-10-04 DIAGNOSIS — D649 Anemia, unspecified: Secondary | ICD-10-CM

## 2012-10-04 DIAGNOSIS — C50911 Malignant neoplasm of unspecified site of right female breast: Secondary | ICD-10-CM

## 2012-10-04 DIAGNOSIS — C50919 Malignant neoplasm of unspecified site of unspecified female breast: Secondary | ICD-10-CM

## 2012-10-04 NOTE — Patient Instructions (Addendum)
Patient to have labs drawn and chest x-ray.

## 2012-10-04 NOTE — Progress Notes (Signed)
Patient to have the following labs drawn at [facility] lab today: B12, Folate, CEA, and CA 27-29.  This patient will also have a chest x-ray PA and lateral completed at [facility].   The nearest evaluated the biopsy site and showed no notable abnormalities. Mild bruising is appreciated.   The patient was contacted by phone with the results of her biopsy completed yesterday. 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 We spent the better part of 45 minutes reviewing options for management. She is much interested in breast conservation.   The issues at present are as follows:  1) the left contralateral lesion that is indeterminate on ultrasound.  2) the actual size of the lesion as there is discrepancy between the [facility] and office ultrasound. If it is indeed about 2 cm she would be a likely candidate for neoadjuvant chemotherapy  3) tolerance of asymmetry: Breast conservation on the right would very likely necessitate removal of the underlying prosthesis resulting in at least one cup size asymmetry plus that related to the volume of tissue removed with the malignancy and changes after radiation therapy. She may benefit from plastic surgery consultation in this regard.  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 All the options open for management should allow her to have full range of motion if she is still diligent and following postoperative instructions.   The patient carried a diagnosis of Crohn's disease, but has had no violation or intervention in decades. She also was told that she suffered from multiple sclerosis while living in order to, but except for occasional unsteadiness of gait she's had no interventions or investigations this regard for at least several years.  She reports that she been determined to be B12 deficient in the past. She's been making use of 2 mg of oral bleed. For several weeks. A  B12 level will be determined.  Arrangements were made for a breast MRI to assess for the actual tumor size of the right breast lesion and to better characterize the left breast lesion as she is not planning on having a biopsy under local anesthesia.  Showed chemotherapy be planned, a roll of power port placement was discussed.  Plans will follow up after the breast MRIs completed.

## 2012-10-05 LAB — CANCER ANTIGEN 27.29: CA 27.29: 40.8 U/mL — ABNORMAL HIGH (ref 0.0–38.6)

## 2012-10-05 LAB — FOLATE: Folate: 10.9 ng/mL (ref >3.0)

## 2012-10-05 LAB — VITAMIN B12: Vitamin B-12: 1212 pg/mL — ABNORMAL HIGH (ref 211–946)

## 2012-10-05 LAB — CEA: CEA: 4.1 ng/mL (ref 0.0–4.7)

## 2012-10-09 ENCOUNTER — Other Ambulatory Visit: Payer: Self-pay | Admitting: *Deleted

## 2012-10-09 ENCOUNTER — Ambulatory Visit: Payer: Self-pay | Admitting: General Surgery

## 2012-10-09 ENCOUNTER — Ambulatory Visit (INDEPENDENT_AMBULATORY_CARE_PROVIDER_SITE_OTHER): Payer: BC Managed Care – PPO | Admitting: *Deleted

## 2012-10-09 ENCOUNTER — Encounter: Payer: Self-pay | Admitting: Internal Medicine

## 2012-10-09 ENCOUNTER — Other Ambulatory Visit: Payer: Self-pay | Admitting: General Surgery

## 2012-10-09 ENCOUNTER — Encounter: Payer: Self-pay | Admitting: General Surgery

## 2012-10-09 DIAGNOSIS — C50919 Malignant neoplasm of unspecified site of unspecified female breast: Secondary | ICD-10-CM

## 2012-10-09 NOTE — Progress Notes (Signed)
Patient has been scheduled for a breast MRI at the [facility] for 10-19-12 at 12:15 pm. Patient made aware per the [facility].

## 2012-10-09 NOTE — Patient Instructions (Addendum)
The patient is aware that a heating pad may be used for comfort as needed.  MRI of breast, she has prescription for xanax Chest x ray today

## 2012-10-09 NOTE — Progress Notes (Signed)
Patient here today for follow up post breast biopsy.  Steri strip remains in place and aware it may come off in one week.  Minimal bruising noted.  The patient is aware that a heating pad may be used for comfort as needed.

## 2012-10-16 ENCOUNTER — Telehealth: Payer: Self-pay | Admitting: Internal Medicine

## 2012-10-16 MED ORDER — ALBUTEROL SULFATE HFA 108 (90 BASE) MCG/ACT IN AERS: 2.0000 | INHALATION_SPRAY | Freq: Four times a day (QID) | RESPIRATORY_TRACT | Status: DC | PRN

## 2012-10-16 NOTE — Telephone Encounter (Signed)
refilled 

## 2012-10-16 NOTE — Telephone Encounter (Signed)
albuterol (PROAIR HFA) 108 (90 BASE) MCG/ACT inhaler

## 2012-10-17 LAB — PATHOLOGY

## 2012-10-19 ENCOUNTER — Ambulatory Visit
Admission: RE | Admit: 2012-10-19 | Discharge: 2012-10-19 | Disposition: A | Discharge status: Home / Self Care | Payer: BC Managed Care – PPO | Source: Ambulatory Visit | Attending: General Surgery | Admitting: General Surgery

## 2012-10-19 DIAGNOSIS — C50919 Malignant neoplasm of unspecified site of unspecified female breast: Secondary | ICD-10-CM

## 2012-10-19 MED ORDER — GADOBENATE DIMEGLUMINE 529 MG/ML IV SOLN
20.0000 mL | Freq: Once | INTRAVENOUS | Status: AC | PRN
  Administered 2012-10-19: 20 mL via INTRAVENOUS

## 2012-10-22 ENCOUNTER — Telehealth: Payer: Self-pay | Admitting: *Deleted

## 2012-10-22 ENCOUNTER — Telehealth: Payer: Self-pay | Admitting: General Surgery

## 2012-10-22 ENCOUNTER — Other Ambulatory Visit: Payer: Self-pay | Admitting: *Deleted

## 2012-10-22 ENCOUNTER — Telehealth: Payer: Self-pay | Admitting: Internal Medicine

## 2012-10-22 DIAGNOSIS — C50919 Malignant neoplasm of unspecified site of unspecified female breast: Secondary | ICD-10-CM

## 2012-10-22 MED ORDER — FLUTICASONE-SALMETEROL 250-50 MCG/DOSE IN AEPB: 1.0000 | INHALATION_SPRAY | Freq: Two times a day (BID) | RESPIRATORY_TRACT | Status: DC

## 2012-10-22 NOTE — Telephone Encounter (Signed)
Message copied by Dominga Ferry on Mon Oct 22, 2012  4:28 PM ------      Message from: [place], [place] W      Created: Mon Oct 22, 2012 12:53 PM       Please arrange for patient evaluation with Dr. Kallie Edward at Cancer center re: neo-adjuvant chemotherapy. Patient is aware you will be calling. Thanks.       ------

## 2012-10-22 NOTE — Telephone Encounter (Signed)
Patient has been scheduled for an appointment with Dr. Wendie Simmer at the [facility] for 10-31-12 at 3 pm. Please note: Dr. Wendie Simmer is on vacation till 10-30-12. This patient is aware of all instructions.

## 2012-10-22 NOTE — Telephone Encounter (Signed)
Refilled per pt request.

## 2012-10-22 NOTE — Telephone Encounter (Signed)
Patient notified of MRI results: Left normal, right 2.6 cm lesion. No additional lesions.  Patient reports she wants to see Dr. Kallie Edward at cancer center.  Does not want to consider plastic surgery to reconstruct the right breast, is aware she will have significant volume discrepancy after wide excision and implant removal.  Reviewed indication for port placement.

## 2012-10-22 NOTE — Telephone Encounter (Signed)
Fluticasone-Salmeterol (ADVAIR) 250-50 MCG/DOSE AEPB

## 2012-10-31 ENCOUNTER — Other Ambulatory Visit: Payer: Self-pay | Admitting: General Surgery

## 2012-10-31 ENCOUNTER — Ambulatory Visit: Payer: Self-pay | Admitting: Hematology and Oncology

## 2012-10-31 DIAGNOSIS — C50911 Malignant neoplasm of unspecified site of right female breast: Secondary | ICD-10-CM

## 2012-11-07 ENCOUNTER — Ambulatory Visit: Payer: Self-pay | Admitting: General Surgery

## 2012-11-07 DIAGNOSIS — C50919 Malignant neoplasm of unspecified site of unspecified female breast: Secondary | ICD-10-CM

## 2012-11-07 HISTORY — PX: PORTACATH PLACEMENT: SHX2246

## 2012-11-08 ENCOUNTER — Encounter: Payer: Self-pay | Admitting: General Surgery

## 2012-11-08 LAB — CBC CANCER CENTER
Basophil #: 0 x10 3/mm (ref 0.0–0.1)
Basophil %: 0.4 %
Eosinophil #: 0.3 x10 3/mm (ref 0.0–0.7)
Eosinophil %: 4 %
HCT: 39.5 % (ref 35.0–47.0)
HGB: 13.7 g/dL (ref 12.0–16.0)
Lymphocyte #: 1.4 x10 3/mm (ref 1.0–3.6)
Lymphocyte %: 16.6 %
MCH: 27.9 pg (ref 26.0–34.0)
MCHC: 34.7 g/dL (ref 32.0–36.0)
MCV: 80 fL (ref 80–100)
Neutrophil #: 6.4 x10 3/mm (ref 1.4–6.5)
Neutrophil %: 74.2 %
Platelet: 202 x10 3/mm (ref 150–440)
RBC: 4.91 10*6/uL (ref 3.80–5.20)
RDW: 14.6 % — ABNORMAL HIGH (ref 11.5–14.5)

## 2012-11-08 LAB — COMPREHENSIVE METABOLIC PANEL
Co2: 27 mmol/L (ref 21–32)
Creatinine: 1.08 mg/dL (ref 0.60–1.30)
EGFR (Non-African Amer.): 54 — ABNORMAL LOW
Osmolality: 281 (ref 275–301)
SGOT(AST): 29 U/L (ref 15–37)
SGPT (ALT): 40 U/L (ref 12–78)
Total Protein: 7 g/dL (ref 6.4–8.2)

## 2012-11-12 ENCOUNTER — Encounter: Payer: Self-pay | Admitting: General Surgery

## 2012-11-13 ENCOUNTER — Ambulatory Visit: Payer: Self-pay | Admitting: Hematology and Oncology

## 2012-11-13 DIAGNOSIS — Z5111 Encounter for antineoplastic chemotherapy: Secondary | ICD-10-CM | POA: Diagnosis not present

## 2012-11-13 DIAGNOSIS — C50519 Malignant neoplasm of lower-outer quadrant of unspecified female breast: Secondary | ICD-10-CM | POA: Diagnosis not present

## 2012-11-13 DIAGNOSIS — R3915 Urgency of urination: Secondary | ICD-10-CM | POA: Diagnosis not present

## 2012-11-13 DIAGNOSIS — F17200 Nicotine dependence, unspecified, uncomplicated: Secondary | ICD-10-CM | POA: Diagnosis not present

## 2012-11-13 DIAGNOSIS — K5090 Crohn's disease, unspecified, without complications: Secondary | ICD-10-CM | POA: Diagnosis not present

## 2012-11-13 DIAGNOSIS — R5381 Other malaise: Secondary | ICD-10-CM | POA: Diagnosis not present

## 2012-11-13 DIAGNOSIS — R0602 Shortness of breath: Secondary | ICD-10-CM | POA: Diagnosis not present

## 2012-11-13 DIAGNOSIS — Z79899 Other long term (current) drug therapy: Secondary | ICD-10-CM | POA: Diagnosis not present

## 2012-11-13 DIAGNOSIS — R059 Cough, unspecified: Secondary | ICD-10-CM | POA: Diagnosis not present

## 2012-11-13 DIAGNOSIS — Z170 Estrogen receptor positive status [ER+]: Secondary | ICD-10-CM | POA: Diagnosis not present

## 2012-11-13 DIAGNOSIS — H538 Other visual disturbances: Secondary | ICD-10-CM | POA: Diagnosis not present

## 2012-11-13 DIAGNOSIS — E538 Deficiency of other specified B group vitamins: Secondary | ICD-10-CM | POA: Diagnosis not present

## 2012-11-13 DIAGNOSIS — G35 Multiple sclerosis: Secondary | ICD-10-CM | POA: Diagnosis not present

## 2012-11-13 DIAGNOSIS — Z7982 Long term (current) use of aspirin: Secondary | ICD-10-CM | POA: Diagnosis not present

## 2012-11-21 LAB — CBC CANCER CENTER
Basophil #: 0 x10 3/mm (ref 0.0–0.1)
Basophil %: 0.7 %
Eosinophil #: 0.1 x10 3/mm (ref 0.0–0.7)
Eosinophil %: 4.5 %
HCT: 37.5 % (ref 35.0–47.0)
HGB: 13.1 g/dL (ref 12.0–16.0)
Lymphocyte #: 0.8 x10 3/mm — ABNORMAL LOW (ref 1.0–3.6)
Lymphocyte %: 48.3 %
MCH: 28.1 pg (ref 26.0–34.0)
MCHC: 34.9 g/dL (ref 32.0–36.0)
MCV: 81 fL (ref 80–100)
Monocyte #: 0.3 x10 3/mm (ref 0.2–0.9)
Neutrophil #: 0.5 x10 3/mm — ABNORMAL LOW (ref 1.4–6.5)
Neutrophil %: 26.7 %
Platelet: 176 x10 3/mm (ref 150–440)
RDW: 13.9 % (ref 11.5–14.5)
WBC: 1.7 x10 3/mm — CL (ref 3.6–11.0)

## 2012-11-21 LAB — BASIC METABOLIC PANEL
Anion Gap: 9 (ref 7–16)
BUN: 10 mg/dL (ref 7–18)
Chloride: 103 mmol/L (ref 98–107)
Co2: 28 mmol/L (ref 21–32)
Creatinine: 0.99 mg/dL (ref 0.60–1.30)
EGFR (African American): >60
EGFR (Non-African Amer.): >60
Potassium: 4.3 mmol/L (ref 3.5–5.1)
Sodium: 140 mmol/L (ref 136–145)

## 2012-11-23 ENCOUNTER — Telehealth: Payer: Self-pay | Admitting: *Deleted

## 2012-11-23 NOTE — Telephone Encounter (Signed)
Patient called back to report that she was able to speak with someone from [facility] and it looks like she will only have to pay a CAD charge of $250. This patient states not to worry about needing to write a letter at this time. She will call back if we can be of further assistance.

## 2012-11-23 NOTE — Telephone Encounter (Signed)
Patient called concerned that her insurance did not pay for her recent breast MRI. This was pre-authorized by her insurance company but she was instructed this is not a guarantee of payment. This patient wishes to appeal the charges. She will be stopping by the office to drop off a copy of the letter she received from [facility]. Patient would appreciate it if you would be willing to write a letter to the insurance company because she does not have the money to pay for the test. Thanks.

## 2012-11-29 DIAGNOSIS — C50519 Malignant neoplasm of lower-outer quadrant of unspecified female breast: Secondary | ICD-10-CM | POA: Diagnosis not present

## 2012-11-29 DIAGNOSIS — G35 Multiple sclerosis: Secondary | ICD-10-CM | POA: Diagnosis not present

## 2012-11-29 DIAGNOSIS — Z170 Estrogen receptor positive status [ER+]: Secondary | ICD-10-CM | POA: Diagnosis not present

## 2012-11-29 DIAGNOSIS — K5090 Crohn's disease, unspecified, without complications: Secondary | ICD-10-CM | POA: Diagnosis not present

## 2012-11-29 LAB — BASIC METABOLIC PANEL
Anion Gap: 6 — ABNORMAL LOW (ref 7–16)
BUN: 10 mg/dL (ref 7–18)
Calcium, Total: 9.2 mg/dL (ref 8.5–10.1)
Co2: 28 mmol/L (ref 21–32)
Creatinine: 0.98 mg/dL (ref 0.60–1.30)
EGFR (African American): >60
EGFR (Non-African Amer.): >60
Glucose: 126 mg/dL — ABNORMAL HIGH (ref 65–99)
Osmolality: 278 (ref 275–301)
Potassium: 3.8 mmol/L (ref 3.5–5.1)
Sodium: 139 mmol/L (ref 136–145)

## 2012-11-29 LAB — CBC CANCER CENTER
Eosinophil #: 0.1 x10 3/mm (ref 0.0–0.7)
Eosinophil %: 0.9 %
HCT: 39 % (ref 35.0–47.0)
Lymphocyte #: 1.3 x10 3/mm (ref 1.0–3.6)
MCH: 28.3 pg (ref 26.0–34.0)
MCHC: 35.4 g/dL (ref 32.0–36.0)
MCV: 80 fL (ref 80–100)
Monocyte %: 9 %
Neutrophil #: 6.1 x10 3/mm (ref 1.4–6.5)
Neutrophil %: 73.4 %
WBC: 8.4 x10 3/mm (ref 3.6–11.0)

## 2012-12-10 LAB — CBC CANCER CENTER
Basophil #: 0 x10 3/mm (ref 0.0–0.1)
Basophil %: 0.6 %
Eosinophil #: 0 x10 3/mm (ref 0.0–0.7)
Eosinophil %: 1.9 %
Lymphocyte #: 0.8 x10 3/mm — ABNORMAL LOW (ref 1.0–3.6)
Lymphocyte %: 39.4 %
MCH: 28.2 pg (ref 26.0–34.0)
MCHC: 35 g/dL (ref 32.0–36.0)
Monocyte %: 10.4 %
Neutrophil %: 47.7 %
Platelet: 148 x10 3/mm — ABNORMAL LOW (ref 150–440)
RBC: 4.58 10*6/uL (ref 3.80–5.20)
RDW: 14.1 % (ref 11.5–14.5)

## 2012-12-14 ENCOUNTER — Ambulatory Visit: Payer: Self-pay | Admitting: Hematology and Oncology

## 2012-12-14 DIAGNOSIS — K5090 Crohn's disease, unspecified, without complications: Secondary | ICD-10-CM | POA: Diagnosis not present

## 2012-12-14 DIAGNOSIS — R059 Cough, unspecified: Secondary | ICD-10-CM | POA: Diagnosis not present

## 2012-12-14 DIAGNOSIS — C50519 Malignant neoplasm of lower-outer quadrant of unspecified female breast: Secondary | ICD-10-CM | POA: Diagnosis not present

## 2012-12-14 DIAGNOSIS — Z170 Estrogen receptor positive status [ER+]: Secondary | ICD-10-CM | POA: Diagnosis not present

## 2012-12-14 DIAGNOSIS — R079 Chest pain, unspecified: Secondary | ICD-10-CM | POA: Diagnosis not present

## 2012-12-14 DIAGNOSIS — B373 Candidiasis of vulva and vagina: Secondary | ICD-10-CM | POA: Diagnosis not present

## 2012-12-14 DIAGNOSIS — Z79899 Other long term (current) drug therapy: Secondary | ICD-10-CM | POA: Diagnosis not present

## 2012-12-14 DIAGNOSIS — R6883 Chills (without fever): Secondary | ICD-10-CM | POA: Diagnosis not present

## 2012-12-14 DIAGNOSIS — G35 Multiple sclerosis: Secondary | ICD-10-CM | POA: Diagnosis not present

## 2012-12-14 DIAGNOSIS — R439 Unspecified disturbances of smell and taste: Secondary | ICD-10-CM | POA: Diagnosis not present

## 2012-12-14 DIAGNOSIS — R630 Anorexia: Secondary | ICD-10-CM | POA: Diagnosis not present

## 2012-12-14 DIAGNOSIS — R5381 Other malaise: Secondary | ICD-10-CM | POA: Diagnosis not present

## 2012-12-14 DIAGNOSIS — Z5111 Encounter for antineoplastic chemotherapy: Secondary | ICD-10-CM | POA: Diagnosis not present

## 2012-12-14 DIAGNOSIS — F17200 Nicotine dependence, unspecified, uncomplicated: Secondary | ICD-10-CM | POA: Diagnosis not present

## 2012-12-14 DIAGNOSIS — K5900 Constipation, unspecified: Secondary | ICD-10-CM | POA: Diagnosis not present

## 2012-12-14 DIAGNOSIS — H579 Unspecified disorder of eye and adnexa: Secondary | ICD-10-CM | POA: Diagnosis not present

## 2012-12-14 DIAGNOSIS — E538 Deficiency of other specified B group vitamins: Secondary | ICD-10-CM | POA: Diagnosis not present

## 2012-12-20 DIAGNOSIS — Z170 Estrogen receptor positive status [ER+]: Secondary | ICD-10-CM | POA: Diagnosis not present

## 2012-12-20 DIAGNOSIS — G35 Multiple sclerosis: Secondary | ICD-10-CM | POA: Diagnosis not present

## 2012-12-20 DIAGNOSIS — R079 Chest pain, unspecified: Secondary | ICD-10-CM | POA: Diagnosis not present

## 2012-12-20 DIAGNOSIS — C50519 Malignant neoplasm of lower-outer quadrant of unspecified female breast: Secondary | ICD-10-CM | POA: Diagnosis not present

## 2012-12-20 LAB — CBC CANCER CENTER
Basophil #: 0.1 x10 3/mm (ref 0.0–0.1)
Eosinophil #: 0.1 x10 3/mm (ref 0.0–0.7)
Lymphocyte %: 15.6 %
MCH: 28.4 pg (ref 26.0–34.0)
Monocyte %: 10.5 %
Neutrophil #: 6.5 x10 3/mm (ref 1.4–6.5)
RBC: 4.78 10*6/uL (ref 3.80–5.20)
RDW: 15.5 % — ABNORMAL HIGH (ref 11.5–14.5)

## 2012-12-20 LAB — BASIC METABOLIC PANEL
BUN: 12 mg/dL (ref 7–18)
Chloride: 100 mmol/L (ref 98–107)
Creatinine: 1.08 mg/dL (ref 0.60–1.30)
EGFR (Non-African Amer.): 54 — ABNORMAL LOW
Glucose: 146 mg/dL — ABNORMAL HIGH (ref 65–99)
Osmolality: 278 (ref 275–301)
Sodium: 138 mmol/L (ref 136–145)

## 2012-12-21 ENCOUNTER — Encounter: Payer: Self-pay | Admitting: Cardiovascular Disease

## 2012-12-21 ENCOUNTER — Ambulatory Visit (INDEPENDENT_AMBULATORY_CARE_PROVIDER_SITE_OTHER): Payer: Medicare Other | Admitting: Cardiovascular Disease

## 2012-12-21 VITALS — BP 126/72 | HR 108 | Ht 66.0 in | Wt 207.8 lb

## 2012-12-21 DIAGNOSIS — R0602 Shortness of breath: Secondary | ICD-10-CM

## 2012-12-21 DIAGNOSIS — R079 Chest pain, unspecified: Secondary | ICD-10-CM | POA: Diagnosis not present

## 2012-12-21 DIAGNOSIS — E7800 Pure hypercholesterolemia, unspecified: Secondary | ICD-10-CM

## 2012-12-21 DIAGNOSIS — I498 Other specified cardiac arrhythmias: Secondary | ICD-10-CM

## 2012-12-21 DIAGNOSIS — R000 Tachycardia, unspecified: Secondary | ICD-10-CM | POA: Insufficient documentation

## 2012-12-21 MED ORDER — LISINOPRIL 2.5 MG PO TABS: 2.5000 mg | ORAL_TABLET | Freq: Every day | ORAL | Status: DC

## 2012-12-21 MED ORDER — CARVEDILOL 3.125 MG PO TABS: 3.1250 mg | ORAL_TABLET | Freq: Two times a day (BID) | ORAL | Status: DC

## 2012-12-21 NOTE — Assessment & Plan Note (Addendum)
She seems to have 2 different types of chest pain. One time seems to be musculoskeletal related to her port. There is also chest tightness which seems to have atypical features as it happens mostly at rest and not with physical activities. She reports having these kind of symptoms even before starting chemotherapy. Baseline ECG does not show significant ischemic changes. She does have multiple risk factors for coronary artery disease and thus I recommend further evaluation with a pharmacologic nuclear stress test. She's not able to exercise on a treadmill due to weakness. She wants to wait until after she finishes her last cycle of chemotherapy. We will be able to evaluate an ejection fraction on the nuclear stress test and thus she will not need a MUGA scan to evaluate the ejection fraction after chemotherapy.

## 2012-12-21 NOTE — Progress Notes (Signed)
HPI  This is a 67 year old female who was referred by Dr. Kallie Edward for evaluation of dyspnea and chest pain. She has no previous cardiac history. She has known  history of asthma, tobacco abuse, severe hypercholesterolemia, hyperthyroidism s/p ablation  and multiple sclerosis. She does have family history of coronary artery disease but not premature. She reports intolerance to statins. She was diagnosed with breast cancer in May and is currently undergoing chemotherapy with Adriamycin and Cytoxan. She reports intermittent and brief episodes of substernal chest discomfort which started even before had chemotherapy. There is pain at the site of port placement which is tender and seems to be clearly musculoskeletal. However, she reports a different kind of tightness feeling at rest and not with physical activities. She denies worsening dyspnea. No orthopnea or PND.   Allergies  Allergen Reactions  . Clindamycin/Lincomycin   . Codeine   . Shellfish Allergy Nausea And Vomiting    Felt goofy headed  . Valium (Diazepam)      Current Outpatient Prescriptions on File Prior to Visit  Medication Sig Dispense Refill  . cholecalciferol (VITAMIN D) 1000 UNITS tablet Take 2,000 Units by mouth daily.      . Cyanocobalamin (VITAMIN B 12 PO) Take by mouth daily.      . ferrous fumarate (HEMOCYTE - 106 MG FE) 325 (106 FE) MG TABS Take 1 tablet (106 mg of iron total) by mouth daily.  30 each  2  . Fluticasone-Salmeterol (ADVAIR) 250-50 MCG/DOSE AEPB Inhale 1 puff into the lungs every 12 (twelve) hours.  60 each  5  . levothyroxine (SYNTHROID, LEVOTHROID) 125 MCG tablet Take 1 tablet (125 mcg total) by mouth daily.  90 tablet  1  . magnesium 30 MG tablet Take 30 mg by mouth daily.      . mometasone (NASONEX) 50 MCG/ACT nasal spray Place 2 sprays into the nose as needed.      Marland Kitchen omeprazole (PRILOSEC OTC) 20 MG tablet Take 20 mg by mouth daily.      . travoprost, benzalkonium, (TRAVATAN) 0.004 % ophthalmic  solution 1 drop at bedtime.       No current facility-administered medications on file prior to visit.     Past Medical History  Diagnosis Date  . Asthma with bronchitis   . Multiple sclerosis 1993  . Hyperthyroidism     s/p ablation maintained on synthroid  . GERD (gastroesophageal reflux disease)   . Glaucoma   . Hypercholesterolemia   . Cancer     breast cancer   . Crohn's disease      Past Surgical History  Procedure Laterality Date  . Tonsillectomy and adenoidectomy  1957  . Right oophorectomy  1979  . Colonoscopy  2010    Dr Vira Agar  . Breast enhancement surgery  [place]  . Portacath placement  11/07/2012     Family History  Problem Relation Age of Onset  . Diabetes Mother   . Heart disease Mother     myocardial infarction  . Glaucoma Mother   . Graves' disease Mother   . Lung disease Father   . Heart disease      half sister  . Peripheral vascular disease      half sister  . Heart attack Mother 97     History   Social History  . Marital Status: Single    Spouse Name: N/A    Number of Children: 1  . Years of Education: N/A  Occupational History  . Not on file.   Social History Main Topics  . Smoking status: Current Every Day Smoker -- 1.00 packs/day for 40 years    Types: Cigarettes  . Smokeless tobacco: Never Used  . Alcohol Use: Yes     Comment: rarely  . Drug Use: No  . Sexually Active: Not on file   Other Topics Concern  . Not on file   Social History Narrative  . No narrative on file     ROS Constitutional: Negative for fever, chills, diaphoresis, activity change, appetite change . HENT: Negative for hearing loss, nosebleeds, congestion, sore throat, facial swelling, drooling, trouble swallowing, neck pain, voice change, sinus pressure and tinnitus.  Eyes: Negative for photophobia, pain, discharge and visual disturbance.  Respiratory: Negative for apnea, cough and wheezing.  Cardiovascular: Negative for   palpitations and leg swelling.  Gastrointestinal: Negative for nausea, vomiting, abdominal pain, diarrhea, constipation, blood in stool and abdominal distention.  Genitourinary: Negative for dysuria, urgency, frequency, hematuria and decreased urine volume.  Musculoskeletal: Negative for myalgias, back pain, joint swelling, arthralgias and gait problem.  Skin: Negative for color change, pallor, rash and wound.  Neurological: Negative for dizziness, tremors, seizures, syncope, speech difficulty, weakness, light-headedness, numbness and headaches.  Psychiatric/Behavioral: Negative for suicidal ideas, hallucinations, behavioral problems and agitation. The patient is not nervous/anxious.     PHYSICAL EXAM   BP 126/72  Pulse 108  Ht 5' 6"  (1.676 m)  Wt 207 lb 12 oz (94.235 kg)  BMI 33.55 kg/m2  LMP 06/15/1977 Constitutional: She is oriented to person, place, and time. She appears well-developed and well-nourished. No distress.  HENT: No nasal discharge.  Head: Normocephalic and atraumatic.  Eyes: Pupils are equal and round. Right eye exhibits no discharge. Left eye exhibits no discharge.  Neck: Normal range of motion. Neck supple. No JVD present. No thyromegaly present.  Cardiovascular: Tachycardic, regular rhythm, normal heart sounds. Exam reveals no gallop and no friction rub. There is a 1/6 systolic ejection murmur at the base which seems to be a flow murmur. Pulmonary/Chest: Effort normal and breath sounds normal. No stridor. No respiratory distress. She has no wheezes. She has no rales. She exhibits no tenderness.  Abdominal: Soft. Bowel sounds are normal. She exhibits no distension. There is no tenderness. There is no rebound and no guarding.  Musculoskeletal: Normal range of motion. She exhibits no edema and no tenderness.  Neurological: She is alert and oriented to person, place, and time. Coordination normal.  Skin: Skin is warm and dry. No rash noted. She is not diaphoretic. No  erythema. No pallor.  Psychiatric: She has a normal mood and affect. Her behavior is normal. Judgment and thought content normal.     EKG: Sinus  Tachycardia  -  Diffuse nonspecific T-abnormality.   ABNORMAL     ASSESSMENT AND PLAN

## 2012-12-21 NOTE — Patient Instructions (Addendum)
Your physician has requested that you have a lexiscan myoview. For further information please visit [email or web address]. Please follow instruction sheet, as given.  Start Lisinopril 2.5 mg once daily Start Carvedilol 3.125 mg twice daily.   Follow up 1 week after stress test.    [facility]  Your caregiver has ordered a Stress Test with nuclear imaging. The purpose of this test is to evaluate the blood supply to your heart muscle. This procedure is referred to as a "Non-Invasive Stress Test." This is because other than having an IV started in your vein, nothing is inserted or "invades" your body. Cardiac stress tests are done to find areas of poor blood flow to the heart by determining the extent of coronary artery disease (CAD). Some patients exercise on a treadmill, which naturally increases the blood flow to your heart, while others who are  unable to walk on a treadmill due to physical limitations have a pharmacologic/chemical stress agent called Lexiscan . This medicine will mimic walking on a treadmill by temporarily increasing your coronary blood flow.   Please note: these test may take anywhere between 2-4 hours to complete  PLEASE REPORT TO [facility] AT THE FIRST DESK WILL DIRECT YOU WHERE TO GO  Date of Procedure:________________Sept 3rd_____________________  Arrival Time for Procedure:__________7:45am____________________  Instructions regarding medication:   Ok to take all medications.    PLEASE NOTIFY THE OFFICE AT LEAST 20 HOURS IN ADVANCE IF YOU ARE UNABLE TO KEEP YOUR APPOINTMENT.  [phone] AND  PLEASE NOTIFY NUCLEAR MEDICINE AT [facility] AT LEAST 24 HOURS IN ADVANCE IF YOU ARE UNABLE TO KEEP YOUR APPOINTMENT. [phone]  How to prepare for your Myoview test:  1. Do not eat or drink after midnight 2. No caffeine for 24 hours prior to test 3. No smoking 24 hours prior to test. 4. Your medication may be taken with water.  If your  doctor stopped a medication because of this test, do not take that medication. 5. Ladies, please do not wear dresses.  Skirts or pants are appropriate. Please wear a short sleeve shirt. 6. No perfume, cologne or lotion. 7. Wear comfortable walking shoes. No heels!

## 2012-12-21 NOTE — Assessment & Plan Note (Signed)
Lab Results  Component Value Date   CHOL 248* 09/12/2012   HDL 33.50* 09/12/2012   LDLDIRECT 205.3 09/12/2012   TRIG 156.0* 09/12/2012   CHOLHDL 7 09/12/2012   Unfortunately, she is intolerant to statins.

## 2012-12-21 NOTE — Assessment & Plan Note (Signed)
She reports, elevated hydrate. 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 There is enough data to suggest that treatment with a beta blocker and an ACE inhibitor might decrease the incidence of chemotherapy-induced cardiomyopathy. Due to that, I started her today on small dose carvedilol 3.125 mg twice daily and lisinopril 2.5 mg once daily.

## 2012-12-25 ENCOUNTER — Telehealth: Payer: Self-pay

## 2012-12-25 ENCOUNTER — Other Ambulatory Visit: Payer: Self-pay

## 2012-12-25 ENCOUNTER — Other Ambulatory Visit: Payer: Self-pay | Admitting: *Deleted

## 2012-12-25 LAB — ER/PR,IMMUNOHISTOCHEM,PARAFFIN
Estrogen Receptor IHC: >90 %
Progesterone Recp IP: 80 %

## 2012-12-25 LAB — HER-2 / NEU, FISH
HER-2/CEP17 Ratio: 1.13
Number of Observers:: 2

## 2012-12-25 NOTE — Telephone Encounter (Signed)
Spoke with patient regarding updated medications. I went over the medications with the patient several times today to make sure all is correct and the patient did agree that all medications are now correct.

## 2012-12-27 LAB — CBC CANCER CENTER
Basophil #: 0.1 x10 3/mm (ref 0.0–0.1)
Basophil %: 1.1 %
Eosinophil #: 0 x10 3/mm (ref 0.0–0.7)
HCT: 36.5 % (ref 35.0–47.0)
HGB: 12.9 g/dL (ref 12.0–16.0)
Lymphocyte #: 0.8 x10 3/mm — ABNORMAL LOW (ref 1.0–3.6)
MCHC: 35.2 g/dL (ref 32.0–36.0)
MCV: 81 fL (ref 80–100)
Monocyte #: 0.1 x10 3/mm — ABNORMAL LOW (ref 0.2–0.9)
Neutrophil #: 3.7 x10 3/mm (ref 1.4–6.5)
Platelet: 174 x10 3/mm (ref 150–440)
RBC: 4.51 10*6/uL (ref 3.80–5.20)
WBC: 4.6 x10 3/mm (ref 3.6–11.0)

## 2012-12-31 DIAGNOSIS — I059 Rheumatic mitral valve disease, unspecified: Secondary | ICD-10-CM | POA: Diagnosis not present

## 2012-12-31 DIAGNOSIS — I209 Angina pectoris, unspecified: Secondary | ICD-10-CM | POA: Diagnosis not present

## 2012-12-31 DIAGNOSIS — E782 Mixed hyperlipidemia: Secondary | ICD-10-CM | POA: Diagnosis not present

## 2013-01-01 DIAGNOSIS — R0789 Other chest pain: Secondary | ICD-10-CM | POA: Diagnosis not present

## 2013-01-01 DIAGNOSIS — I119 Hypertensive heart disease without heart failure: Secondary | ICD-10-CM | POA: Diagnosis not present

## 2013-01-01 DIAGNOSIS — E782 Mixed hyperlipidemia: Secondary | ICD-10-CM | POA: Diagnosis not present

## 2013-01-01 DIAGNOSIS — I209 Angina pectoris, unspecified: Secondary | ICD-10-CM | POA: Diagnosis not present

## 2013-01-03 LAB — CBC CANCER CENTER
Basophil %: 1 %
Eosinophil #: 0.1 x10 3/mm (ref 0.0–0.7)
HCT: 34.7 % — ABNORMAL LOW (ref 35.0–47.0)
HGB: 12.2 g/dL (ref 12.0–16.0)
Lymphocyte %: 36.3 %
Monocyte %: 19.3 %
Neutrophil #: 0.8 x10 3/mm — ABNORMAL LOW (ref 1.4–6.5)
Neutrophil %: 40.4 %

## 2013-01-10 DIAGNOSIS — C50519 Malignant neoplasm of lower-outer quadrant of unspecified female breast: Secondary | ICD-10-CM | POA: Diagnosis not present

## 2013-01-10 DIAGNOSIS — G35 Multiple sclerosis: Secondary | ICD-10-CM | POA: Diagnosis not present

## 2013-01-10 DIAGNOSIS — Z170 Estrogen receptor positive status [ER+]: Secondary | ICD-10-CM | POA: Diagnosis not present

## 2013-01-10 DIAGNOSIS — B373 Candidiasis of vulva and vagina: Secondary | ICD-10-CM | POA: Diagnosis not present

## 2013-01-10 LAB — BASIC METABOLIC PANEL
Anion Gap: 9 (ref 7–16)
BUN: 10 mg/dL (ref 7–18)
Calcium, Total: 9.2 mg/dL (ref 8.5–10.1)
Chloride: 102 mmol/L (ref 98–107)
Co2: 27 mmol/L (ref 21–32)
EGFR (Non-African Amer.): 57 — ABNORMAL LOW
Glucose: 171 mg/dL — ABNORMAL HIGH (ref 65–99)
Potassium: 4 mmol/L (ref 3.5–5.1)
Sodium: 138 mmol/L (ref 136–145)

## 2013-01-10 LAB — CBC CANCER CENTER
Basophil %: 0.9 %
Lymphocyte #: 1.1 x10 3/mm (ref 1.0–3.6)
MCH: 29.4 pg (ref 26.0–34.0)
MCHC: 35.9 g/dL (ref 32.0–36.0)
Monocyte %: 8.1 %
Neutrophil %: 77.2 %
RBC: 4.31 10*6/uL (ref 3.80–5.20)

## 2013-01-14 ENCOUNTER — Ambulatory Visit: Payer: Self-pay | Admitting: Hematology and Oncology

## 2013-01-14 DIAGNOSIS — R5381 Other malaise: Secondary | ICD-10-CM | POA: Diagnosis not present

## 2013-01-14 DIAGNOSIS — E7800 Pure hypercholesterolemia, unspecified: Secondary | ICD-10-CM | POA: Diagnosis not present

## 2013-01-14 DIAGNOSIS — G35 Multiple sclerosis: Secondary | ICD-10-CM | POA: Diagnosis not present

## 2013-01-14 DIAGNOSIS — R197 Diarrhea, unspecified: Secondary | ICD-10-CM | POA: Diagnosis not present

## 2013-01-14 DIAGNOSIS — M549 Dorsalgia, unspecified: Secondary | ICD-10-CM | POA: Diagnosis not present

## 2013-01-14 DIAGNOSIS — Z170 Estrogen receptor positive status [ER+]: Secondary | ICD-10-CM | POA: Diagnosis not present

## 2013-01-14 DIAGNOSIS — K5900 Constipation, unspecified: Secondary | ICD-10-CM | POA: Diagnosis not present

## 2013-01-14 DIAGNOSIS — R29898 Other symptoms and signs involving the musculoskeletal system: Secondary | ICD-10-CM | POA: Diagnosis not present

## 2013-01-14 DIAGNOSIS — K5090 Crohn's disease, unspecified, without complications: Secondary | ICD-10-CM | POA: Diagnosis not present

## 2013-01-14 DIAGNOSIS — E538 Deficiency of other specified B group vitamins: Secondary | ICD-10-CM | POA: Diagnosis not present

## 2013-01-14 DIAGNOSIS — C50519 Malignant neoplasm of lower-outer quadrant of unspecified female breast: Secondary | ICD-10-CM | POA: Diagnosis not present

## 2013-01-14 DIAGNOSIS — Z7982 Long term (current) use of aspirin: Secondary | ICD-10-CM | POA: Diagnosis not present

## 2013-01-14 DIAGNOSIS — E039 Hypothyroidism, unspecified: Secondary | ICD-10-CM | POA: Diagnosis not present

## 2013-01-14 DIAGNOSIS — K219 Gastro-esophageal reflux disease without esophagitis: Secondary | ICD-10-CM | POA: Diagnosis not present

## 2013-01-14 DIAGNOSIS — Z79899 Other long term (current) drug therapy: Secondary | ICD-10-CM | POA: Diagnosis not present

## 2013-01-14 DIAGNOSIS — Z5111 Encounter for antineoplastic chemotherapy: Secondary | ICD-10-CM | POA: Diagnosis not present

## 2013-01-14 DIAGNOSIS — R300 Dysuria: Secondary | ICD-10-CM | POA: Diagnosis not present

## 2013-01-14 DIAGNOSIS — F17200 Nicotine dependence, unspecified, uncomplicated: Secondary | ICD-10-CM | POA: Diagnosis not present

## 2013-01-17 DIAGNOSIS — E538 Deficiency of other specified B group vitamins: Secondary | ICD-10-CM | POA: Diagnosis not present

## 2013-01-17 DIAGNOSIS — K5090 Crohn's disease, unspecified, without complications: Secondary | ICD-10-CM | POA: Diagnosis not present

## 2013-01-17 DIAGNOSIS — Z170 Estrogen receptor positive status [ER+]: Secondary | ICD-10-CM | POA: Diagnosis not present

## 2013-01-17 DIAGNOSIS — C50519 Malignant neoplasm of lower-outer quadrant of unspecified female breast: Secondary | ICD-10-CM | POA: Diagnosis not present

## 2013-01-17 DIAGNOSIS — Z5111 Encounter for antineoplastic chemotherapy: Secondary | ICD-10-CM | POA: Diagnosis not present

## 2013-01-17 DIAGNOSIS — G35 Multiple sclerosis: Secondary | ICD-10-CM | POA: Diagnosis not present

## 2013-01-17 LAB — CBC CANCER CENTER
Basophil %: 0.8 %
Eosinophil #: 0 x10 3/mm (ref 0.0–0.7)
HCT: 35.1 % (ref 35.0–47.0)
HGB: 12.1 g/dL (ref 12.0–16.0)
Lymphocyte %: 16.7 %
Monocyte #: 0.1 x10 3/mm — ABNORMAL LOW (ref 0.2–0.9)
Neutrophil %: 80.6 %
RDW: 17.1 % — ABNORMAL HIGH (ref 11.5–14.5)
WBC: 4.5 x10 3/mm (ref 3.6–11.0)

## 2013-01-22 ENCOUNTER — Ambulatory Visit: Payer: BC Managed Care – PPO | Admitting: Internal Medicine

## 2013-01-24 ENCOUNTER — Ambulatory Visit: Payer: Medicare Other | Admitting: Cardiovascular Disease

## 2013-01-24 DIAGNOSIS — K5090 Crohn's disease, unspecified, without complications: Secondary | ICD-10-CM | POA: Diagnosis not present

## 2013-01-24 DIAGNOSIS — C50519 Malignant neoplasm of lower-outer quadrant of unspecified female breast: Secondary | ICD-10-CM | POA: Diagnosis not present

## 2013-01-24 DIAGNOSIS — E538 Deficiency of other specified B group vitamins: Secondary | ICD-10-CM | POA: Diagnosis not present

## 2013-01-24 DIAGNOSIS — Z170 Estrogen receptor positive status [ER+]: Secondary | ICD-10-CM | POA: Diagnosis not present

## 2013-01-24 DIAGNOSIS — Z5111 Encounter for antineoplastic chemotherapy: Secondary | ICD-10-CM | POA: Diagnosis not present

## 2013-01-24 DIAGNOSIS — G35 Multiple sclerosis: Secondary | ICD-10-CM | POA: Diagnosis not present

## 2013-01-24 LAB — CBC CANCER CENTER
Basophil #: 0 "x10 3/mm "
Basophil %: 0.7 %
Eosinophil #: 0 "x10 3/mm "
Eosinophil %: 2.4 %
HCT: 34.1 % — ABNORMAL LOW
HGB: 11.6 g/dL — ABNORMAL LOW
Lymphocyte %: 40.7 %
Lymphs Abs: 0.8 "x10 3/mm " — ABNORMAL LOW
MCH: 28.6 pg
MCHC: 34 g/dL
MCV: 84 fL
Monocyte #: 0.4 "x10 3/mm "
Monocyte %: 20.8 %
Neutrophil #: 0.7 "x10 3/mm " — ABNORMAL LOW
Neutrophil %: 35.4 %
Platelet: 158 "x10 3/mm "
RBC: 4.05 "x10 6/mm "
RDW: 17.9 % — ABNORMAL HIGH
WBC: 2 "x10 3/mm " — CL

## 2013-01-31 DIAGNOSIS — C50519 Malignant neoplasm of lower-outer quadrant of unspecified female breast: Secondary | ICD-10-CM | POA: Diagnosis not present

## 2013-01-31 DIAGNOSIS — E538 Deficiency of other specified B group vitamins: Secondary | ICD-10-CM | POA: Diagnosis not present

## 2013-01-31 DIAGNOSIS — K5090 Crohn's disease, unspecified, without complications: Secondary | ICD-10-CM | POA: Diagnosis not present

## 2013-01-31 DIAGNOSIS — R300 Dysuria: Secondary | ICD-10-CM | POA: Diagnosis not present

## 2013-01-31 DIAGNOSIS — Z170 Estrogen receptor positive status [ER+]: Secondary | ICD-10-CM | POA: Diagnosis not present

## 2013-01-31 DIAGNOSIS — G35 Multiple sclerosis: Secondary | ICD-10-CM | POA: Diagnosis not present

## 2013-01-31 DIAGNOSIS — Z5111 Encounter for antineoplastic chemotherapy: Secondary | ICD-10-CM | POA: Diagnosis not present

## 2013-01-31 LAB — COMPREHENSIVE METABOLIC PANEL
Albumin: 3.4 g/dL (ref 3.4–5.0)
Alkaline Phosphatase: 102 U/L (ref 50–136)
BUN: 9 mg/dL (ref 7–18)
Bilirubin,Total: 0.4 mg/dL (ref 0.2–1.0)
Calcium, Total: 9.6 mg/dL (ref 8.5–10.1)
Chloride: 101 mmol/L (ref 98–107)
Co2: 28 mmol/L (ref 21–32)
Creatinine: 0.85 mg/dL (ref 0.60–1.30)
EGFR (African American): >60
Glucose: 125 mg/dL — ABNORMAL HIGH (ref 65–99)
Osmolality: 276 (ref 275–301)
SGOT(AST): 32 U/L (ref 15–37)
SGPT (ALT): 34 U/L (ref 12–78)
Sodium: 138 mmol/L (ref 136–145)
Total Protein: 7 g/dL (ref 6.4–8.2)

## 2013-01-31 LAB — CBC CANCER CENTER
Eosinophil #: 0 x10 3/mm (ref 0.0–0.7)
Eosinophil %: 0.4 %
HCT: 36.2 % (ref 35.0–47.0)
Lymphocyte #: 1.1 x10 3/mm (ref 1.0–3.6)
MCH: 29.1 pg (ref 26.0–34.0)
MCV: 85 fL (ref 80–100)
Monocyte #: 0.8 x10 3/mm (ref 0.2–0.9)
Monocyte %: 10.5 %
Neutrophil %: 73.4 %
WBC: 7.4 x10 3/mm (ref 3.6–11.0)

## 2013-02-07 DIAGNOSIS — Z170 Estrogen receptor positive status [ER+]: Secondary | ICD-10-CM | POA: Diagnosis not present

## 2013-02-07 DIAGNOSIS — E538 Deficiency of other specified B group vitamins: Secondary | ICD-10-CM | POA: Diagnosis not present

## 2013-02-07 DIAGNOSIS — C50519 Malignant neoplasm of lower-outer quadrant of unspecified female breast: Secondary | ICD-10-CM | POA: Diagnosis not present

## 2013-02-07 DIAGNOSIS — K5090 Crohn's disease, unspecified, without complications: Secondary | ICD-10-CM | POA: Diagnosis not present

## 2013-02-07 DIAGNOSIS — Z5111 Encounter for antineoplastic chemotherapy: Secondary | ICD-10-CM | POA: Diagnosis not present

## 2013-02-07 DIAGNOSIS — G35 Multiple sclerosis: Secondary | ICD-10-CM | POA: Diagnosis not present

## 2013-02-07 LAB — CBC CANCER CENTER
Basophil #: 0 x10 3/mm (ref 0.0–0.1)
Basophil %: 0.5 %
Lymphocyte #: 1.1 x10 3/mm (ref 1.0–3.6)
MCH: 30 pg (ref 26.0–34.0)
MCHC: 35.5 g/dL (ref 32.0–36.0)
MCV: 85 fL (ref 80–100)
Monocyte #: 0.6 x10 3/mm (ref 0.2–0.9)
Neutrophil %: 77.1 %
RDW: 18.6 % — ABNORMAL HIGH (ref 11.5–14.5)

## 2013-02-12 ENCOUNTER — Other Ambulatory Visit: Payer: Self-pay | Admitting: *Deleted

## 2013-02-12 MED ORDER — FERROUS FUMARATE 325 (106 FE) MG PO TABS: 1.0000 | ORAL_TABLET | Freq: Every day | ORAL | Status: DC

## 2013-02-12 MED ORDER — LEVOTHYROXINE SODIUM 125 MCG PO TABS: 125.0000 ug | ORAL_TABLET | Freq: Every day | ORAL | Status: DC

## 2013-02-13 ENCOUNTER — Ambulatory Visit: Payer: Self-pay | Admitting: Hematology and Oncology

## 2013-02-13 DIAGNOSIS — Z5111 Encounter for antineoplastic chemotherapy: Secondary | ICD-10-CM | POA: Diagnosis not present

## 2013-02-13 DIAGNOSIS — Z170 Estrogen receptor positive status [ER+]: Secondary | ICD-10-CM | POA: Diagnosis not present

## 2013-02-13 DIAGNOSIS — R21 Rash and other nonspecific skin eruption: Secondary | ICD-10-CM | POA: Diagnosis not present

## 2013-02-13 DIAGNOSIS — G35 Multiple sclerosis: Secondary | ICD-10-CM | POA: Diagnosis not present

## 2013-02-13 DIAGNOSIS — E039 Hypothyroidism, unspecified: Secondary | ICD-10-CM | POA: Diagnosis not present

## 2013-02-13 DIAGNOSIS — J3489 Other specified disorders of nose and nasal sinuses: Secondary | ICD-10-CM | POA: Diagnosis not present

## 2013-02-13 DIAGNOSIS — K219 Gastro-esophageal reflux disease without esophagitis: Secondary | ICD-10-CM | POA: Diagnosis not present

## 2013-02-13 DIAGNOSIS — E538 Deficiency of other specified B group vitamins: Secondary | ICD-10-CM | POA: Diagnosis not present

## 2013-02-13 DIAGNOSIS — R350 Frequency of micturition: Secondary | ICD-10-CM | POA: Diagnosis not present

## 2013-02-13 DIAGNOSIS — Z79899 Other long term (current) drug therapy: Secondary | ICD-10-CM | POA: Diagnosis not present

## 2013-02-13 DIAGNOSIS — R0602 Shortness of breath: Secondary | ICD-10-CM | POA: Diagnosis not present

## 2013-02-13 DIAGNOSIS — M79609 Pain in unspecified limb: Secondary | ICD-10-CM | POA: Diagnosis not present

## 2013-02-13 DIAGNOSIS — M549 Dorsalgia, unspecified: Secondary | ICD-10-CM | POA: Diagnosis not present

## 2013-02-13 DIAGNOSIS — G620 Drug-induced polyneuropathy: Secondary | ICD-10-CM | POA: Diagnosis not present

## 2013-02-13 DIAGNOSIS — R5381 Other malaise: Secondary | ICD-10-CM | POA: Diagnosis not present

## 2013-02-13 DIAGNOSIS — F17200 Nicotine dependence, unspecified, uncomplicated: Secondary | ICD-10-CM | POA: Diagnosis not present

## 2013-02-13 DIAGNOSIS — R059 Cough, unspecified: Secondary | ICD-10-CM | POA: Diagnosis not present

## 2013-02-13 DIAGNOSIS — D649 Anemia, unspecified: Secondary | ICD-10-CM | POA: Diagnosis not present

## 2013-02-13 DIAGNOSIS — K5090 Crohn's disease, unspecified, without complications: Secondary | ICD-10-CM | POA: Diagnosis not present

## 2013-02-13 DIAGNOSIS — K5900 Constipation, unspecified: Secondary | ICD-10-CM | POA: Diagnosis not present

## 2013-02-13 DIAGNOSIS — E7800 Pure hypercholesterolemia, unspecified: Secondary | ICD-10-CM | POA: Diagnosis not present

## 2013-02-13 DIAGNOSIS — R51 Headache: Secondary | ICD-10-CM | POA: Diagnosis not present

## 2013-02-13 DIAGNOSIS — C50519 Malignant neoplasm of lower-outer quadrant of unspecified female breast: Secondary | ICD-10-CM | POA: Diagnosis not present

## 2013-02-13 DIAGNOSIS — R1110 Vomiting, unspecified: Secondary | ICD-10-CM | POA: Diagnosis not present

## 2013-02-13 LAB — CBC CANCER CENTER
Basophil #: 0 x10 3/mm (ref 0.0–0.1)
Basophil %: 0.4 %
Eosinophil %: 3.9 %
HCT: 35.9 % (ref 35.0–47.0)
HGB: 12.4 g/dL (ref 12.0–16.0)
Lymphocyte #: 1.2 x10 3/mm (ref 1.0–3.6)
Lymphocyte %: 15.1 %
MCHC: 34.6 g/dL (ref 32.0–36.0)
MCV: 87 fL (ref 80–100)
Monocyte #: 0.4 x10 3/mm (ref 0.2–0.9)
RBC: 4.14 10*6/uL (ref 3.80–5.20)
RDW: 18.6 % — ABNORMAL HIGH (ref 11.5–14.5)

## 2013-02-20 DIAGNOSIS — Z170 Estrogen receptor positive status [ER+]: Secondary | ICD-10-CM | POA: Diagnosis not present

## 2013-02-20 DIAGNOSIS — E538 Deficiency of other specified B group vitamins: Secondary | ICD-10-CM | POA: Diagnosis not present

## 2013-02-20 DIAGNOSIS — C50519 Malignant neoplasm of lower-outer quadrant of unspecified female breast: Secondary | ICD-10-CM | POA: Diagnosis not present

## 2013-02-20 DIAGNOSIS — D649 Anemia, unspecified: Secondary | ICD-10-CM | POA: Diagnosis not present

## 2013-02-20 LAB — CBC CANCER CENTER
Basophil #: 0 x10 3/mm (ref 0.0–0.1)
Basophil %: 0.3 %
Eosinophil #: 0.3 x10 3/mm (ref 0.0–0.7)
Lymphocyte #: 1.1 x10 3/mm (ref 1.0–3.6)
Lymphocyte %: 12 %
MCH: 30.8 pg (ref 26.0–34.0)
MCHC: 34.9 g/dL (ref 32.0–36.0)
MCV: 88 fL (ref 80–100)
Monocyte #: 0.5 x10 3/mm (ref 0.2–0.9)
Monocyte %: 5.8 %
Neutrophil #: 7 x10 3/mm — ABNORMAL HIGH (ref 1.4–6.5)

## 2013-02-27 LAB — CBC CANCER CENTER
Basophil %: 0.7 %
Eosinophil #: 0.3 x10 3/mm (ref 0.0–0.7)
HCT: 33.1 % — ABNORMAL LOW (ref 35.0–47.0)
HGB: 11.5 g/dL — ABNORMAL LOW (ref 12.0–16.0)
MCHC: 34.8 g/dL (ref 32.0–36.0)
MCV: 90 fL (ref 80–100)
Monocyte %: 5.7 %
Neutrophil #: 6.4 x10 3/mm (ref 1.4–6.5)
Neutrophil %: 77.1 %
RBC: 3.7 10*6/uL — ABNORMAL LOW (ref 3.80–5.20)
WBC: 8.3 x10 3/mm (ref 3.6–11.0)

## 2013-03-06 DIAGNOSIS — Z170 Estrogen receptor positive status [ER+]: Secondary | ICD-10-CM | POA: Diagnosis not present

## 2013-03-06 DIAGNOSIS — J3489 Other specified disorders of nose and nasal sinuses: Secondary | ICD-10-CM | POA: Diagnosis not present

## 2013-03-06 DIAGNOSIS — R21 Rash and other nonspecific skin eruption: Secondary | ICD-10-CM | POA: Diagnosis not present

## 2013-03-06 DIAGNOSIS — C50519 Malignant neoplasm of lower-outer quadrant of unspecified female breast: Secondary | ICD-10-CM | POA: Diagnosis not present

## 2013-03-06 LAB — BASIC METABOLIC PANEL
Anion Gap: 8 (ref 7–16)
BUN: 8 mg/dL (ref 7–18)
Calcium, Total: 8.7 mg/dL (ref 8.5–10.1)
Co2: 27 mmol/L (ref 21–32)
Creatinine: 0.91 mg/dL (ref 0.60–1.30)
EGFR (African American): >60
EGFR (Non-African Amer.): >60
Osmolality: 271 (ref 275–301)
Sodium: 136 mmol/L (ref 136–145)

## 2013-03-06 LAB — CBC CANCER CENTER
Basophil #: 0 x10 3/mm (ref 0.0–0.1)
Basophil %: 0.5 %
Eosinophil #: 0.4 x10 3/mm (ref 0.0–0.7)
HCT: 34.2 % — ABNORMAL LOW (ref 35.0–47.0)
HGB: 11.8 g/dL — ABNORMAL LOW (ref 12.0–16.0)
Lymphocyte #: 1.1 x10 3/mm (ref 1.0–3.6)
MCV: 91 fL (ref 80–100)
Monocyte #: 0.4 x10 3/mm (ref 0.2–0.9)
Monocyte %: 4.8 %
Neutrophil #: 7 x10 3/mm — ABNORMAL HIGH (ref 1.4–6.5)
Neutrophil %: 77.9 %
Platelet: 216 x10 3/mm (ref 150–440)
RDW: 17.2 % — ABNORMAL HIGH (ref 11.5–14.5)

## 2013-03-13 DIAGNOSIS — R1110 Vomiting, unspecified: Secondary | ICD-10-CM | POA: Diagnosis not present

## 2013-03-13 DIAGNOSIS — Z170 Estrogen receptor positive status [ER+]: Secondary | ICD-10-CM | POA: Diagnosis not present

## 2013-03-13 DIAGNOSIS — C50519 Malignant neoplasm of lower-outer quadrant of unspecified female breast: Secondary | ICD-10-CM | POA: Diagnosis not present

## 2013-03-13 DIAGNOSIS — M79609 Pain in unspecified limb: Secondary | ICD-10-CM | POA: Diagnosis not present

## 2013-03-13 LAB — CBC CANCER CENTER
Basophil %: 0.5 %
Eosinophil #: 0.3 x10 3/mm (ref 0.0–0.7)
HCT: 34.4 % — ABNORMAL LOW (ref 35.0–47.0)
HGB: 11.7 g/dL — ABNORMAL LOW (ref 12.0–16.0)
Lymphocyte #: 1.1 x10 3/mm (ref 1.0–3.6)
Lymphocyte %: 12.7 %
MCH: 31 pg (ref 26.0–34.0)
MCV: 91 fL (ref 80–100)
Monocyte #: 0.4 x10 3/mm (ref 0.2–0.9)
Monocyte %: 4.5 %
Neutrophil %: 78.7 %
Platelet: 202 x10 3/mm (ref 150–440)
RBC: 3.78 10*6/uL — ABNORMAL LOW (ref 3.80–5.20)
RDW: 17 % — ABNORMAL HIGH (ref 11.5–14.5)
WBC: 8.8 x10 3/mm (ref 3.6–11.0)

## 2013-03-16 ENCOUNTER — Ambulatory Visit: Payer: Self-pay | Admitting: Hematology and Oncology

## 2013-03-19 DIAGNOSIS — C50919 Malignant neoplasm of unspecified site of unspecified female breast: Secondary | ICD-10-CM | POA: Diagnosis not present

## 2013-03-21 ENCOUNTER — Other Ambulatory Visit: Payer: Self-pay

## 2013-03-22 ENCOUNTER — Ambulatory Visit: Payer: Self-pay | Admitting: Surgery

## 2013-03-22 DIAGNOSIS — Z9010 Acquired absence of unspecified breast and nipple: Secondary | ICD-10-CM | POA: Diagnosis not present

## 2013-03-22 DIAGNOSIS — C50919 Malignant neoplasm of unspecified site of unspecified female breast: Secondary | ICD-10-CM | POA: Diagnosis not present

## 2013-03-22 DIAGNOSIS — N63 Unspecified lump in unspecified breast: Secondary | ICD-10-CM | POA: Diagnosis not present

## 2013-03-27 DIAGNOSIS — C50919 Malignant neoplasm of unspecified site of unspecified female breast: Secondary | ICD-10-CM | POA: Diagnosis not present

## 2013-04-01 ENCOUNTER — Ambulatory Visit: Payer: Self-pay | Admitting: Surgery

## 2013-04-05 ENCOUNTER — Ambulatory Visit: Payer: Self-pay | Admitting: Surgery

## 2013-04-05 DIAGNOSIS — J45909 Unspecified asthma, uncomplicated: Secondary | ICD-10-CM | POA: Diagnosis not present

## 2013-04-05 DIAGNOSIS — Z9010 Acquired absence of unspecified breast and nipple: Secondary | ICD-10-CM | POA: Diagnosis not present

## 2013-04-05 DIAGNOSIS — J410 Simple chronic bronchitis: Secondary | ICD-10-CM | POA: Diagnosis not present

## 2013-04-05 DIAGNOSIS — F17200 Nicotine dependence, unspecified, uncomplicated: Secondary | ICD-10-CM | POA: Diagnosis not present

## 2013-04-05 DIAGNOSIS — G35 Multiple sclerosis: Secondary | ICD-10-CM | POA: Diagnosis not present

## 2013-04-05 DIAGNOSIS — Z881 Allergy status to other antibiotic agents status: Secondary | ICD-10-CM | POA: Diagnosis not present

## 2013-04-05 DIAGNOSIS — D649 Anemia, unspecified: Secondary | ICD-10-CM | POA: Diagnosis not present

## 2013-04-05 DIAGNOSIS — Z978 Presence of other specified devices: Secondary | ICD-10-CM | POA: Diagnosis not present

## 2013-04-05 DIAGNOSIS — H409 Unspecified glaucoma: Secondary | ICD-10-CM | POA: Diagnosis not present

## 2013-04-05 DIAGNOSIS — K3189 Other diseases of stomach and duodenum: Secondary | ICD-10-CM | POA: Diagnosis not present

## 2013-04-05 DIAGNOSIS — Z888 Allergy status to other drugs, medicaments and biological substances status: Secondary | ICD-10-CM | POA: Diagnosis not present

## 2013-04-05 DIAGNOSIS — C50919 Malignant neoplasm of unspecified site of unspecified female breast: Secondary | ICD-10-CM | POA: Diagnosis not present

## 2013-04-05 DIAGNOSIS — Z91013 Allergy to seafood: Secondary | ICD-10-CM | POA: Diagnosis not present

## 2013-04-05 DIAGNOSIS — G43909 Migraine, unspecified, not intractable, without status migrainosus: Secondary | ICD-10-CM | POA: Diagnosis not present

## 2013-04-05 DIAGNOSIS — Z885 Allergy status to narcotic agent status: Secondary | ICD-10-CM | POA: Diagnosis not present

## 2013-04-05 DIAGNOSIS — I059 Rheumatic mitral valve disease, unspecified: Secondary | ICD-10-CM | POA: Diagnosis not present

## 2013-04-05 DIAGNOSIS — E0590 Thyrotoxicosis, unspecified without thyrotoxic crisis or storm: Secondary | ICD-10-CM | POA: Diagnosis not present

## 2013-04-05 DIAGNOSIS — N63 Unspecified lump in unspecified breast: Secondary | ICD-10-CM | POA: Diagnosis not present

## 2013-04-05 DIAGNOSIS — Z170 Estrogen receptor positive status [ER+]: Secondary | ICD-10-CM | POA: Diagnosis not present

## 2013-04-05 DIAGNOSIS — Z79899 Other long term (current) drug therapy: Secondary | ICD-10-CM | POA: Diagnosis not present

## 2013-04-05 DIAGNOSIS — G609 Hereditary and idiopathic neuropathy, unspecified: Secondary | ICD-10-CM | POA: Diagnosis not present

## 2013-04-09 LAB — PATHOLOGY REPORT

## 2013-04-23 ENCOUNTER — Other Ambulatory Visit: Payer: Self-pay | Admitting: Internal Medicine

## 2013-04-23 ENCOUNTER — Ambulatory Visit: Payer: Self-pay | Admitting: Hematology and Oncology

## 2013-04-23 DIAGNOSIS — F17200 Nicotine dependence, unspecified, uncomplicated: Secondary | ICD-10-CM | POA: Diagnosis not present

## 2013-04-23 DIAGNOSIS — K5090 Crohn's disease, unspecified, without complications: Secondary | ICD-10-CM | POA: Diagnosis not present

## 2013-04-23 DIAGNOSIS — F411 Generalized anxiety disorder: Secondary | ICD-10-CM | POA: Diagnosis not present

## 2013-04-23 DIAGNOSIS — K5900 Constipation, unspecified: Secondary | ICD-10-CM | POA: Diagnosis not present

## 2013-04-23 DIAGNOSIS — G35 Multiple sclerosis: Secondary | ICD-10-CM | POA: Diagnosis not present

## 2013-04-23 DIAGNOSIS — Z510 Encounter for antineoplastic radiation therapy: Secondary | ICD-10-CM | POA: Diagnosis not present

## 2013-04-23 DIAGNOSIS — E785 Hyperlipidemia, unspecified: Secondary | ICD-10-CM | POA: Diagnosis not present

## 2013-04-23 DIAGNOSIS — K219 Gastro-esophageal reflux disease without esophagitis: Secondary | ICD-10-CM | POA: Diagnosis not present

## 2013-04-23 DIAGNOSIS — E039 Hypothyroidism, unspecified: Secondary | ICD-10-CM | POA: Diagnosis not present

## 2013-04-23 DIAGNOSIS — G620 Drug-induced polyneuropathy: Secondary | ICD-10-CM | POA: Diagnosis not present

## 2013-04-23 DIAGNOSIS — Z170 Estrogen receptor positive status [ER+]: Secondary | ICD-10-CM | POA: Diagnosis not present

## 2013-04-23 DIAGNOSIS — C50419 Malignant neoplasm of upper-outer quadrant of unspecified female breast: Secondary | ICD-10-CM | POA: Diagnosis not present

## 2013-04-23 DIAGNOSIS — Z79899 Other long term (current) drug therapy: Secondary | ICD-10-CM | POA: Diagnosis not present

## 2013-04-23 DIAGNOSIS — R5381 Other malaise: Secondary | ICD-10-CM | POA: Diagnosis not present

## 2013-04-24 DIAGNOSIS — C50419 Malignant neoplasm of upper-outer quadrant of unspecified female breast: Secondary | ICD-10-CM | POA: Diagnosis not present

## 2013-04-24 DIAGNOSIS — G35 Multiple sclerosis: Secondary | ICD-10-CM | POA: Diagnosis not present

## 2013-04-24 DIAGNOSIS — G620 Drug-induced polyneuropathy: Secondary | ICD-10-CM | POA: Diagnosis not present

## 2013-04-24 DIAGNOSIS — Z170 Estrogen receptor positive status [ER+]: Secondary | ICD-10-CM | POA: Diagnosis not present

## 2013-04-24 LAB — CBC CANCER CENTER
Basophil %: 0.7 %
Eosinophil %: 3.5 %
HCT: 38.5 % (ref 35.0–47.0)
HGB: 13 g/dL (ref 12.0–16.0)
Lymphocyte #: 1.1 x10 3/mm (ref 1.0–3.6)
Lymphocyte %: 12.6 %
MCH: 30.3 pg (ref 26.0–34.0)
Neutrophil #: 6.5 x10 3/mm (ref 1.4–6.5)
RBC: 4.29 10*6/uL (ref 3.80–5.20)
RDW: 14.1 % (ref 11.5–14.5)
WBC: 8.3 x10 3/mm (ref 3.6–11.0)

## 2013-04-24 LAB — COMPREHENSIVE METABOLIC PANEL
Albumin: 3.4 g/dL (ref 3.4–5.0)
Alkaline Phosphatase: 109 U/L
Anion Gap: 8 (ref 7–16)
BUN: 12 mg/dL (ref 7–18)
Bilirubin,Total: 0.3 mg/dL (ref 0.2–1.0)
Calcium, Total: 9.3 mg/dL (ref 8.5–10.1)
Chloride: 101 mmol/L (ref 98–107)
Co2: 29 mmol/L (ref 21–32)
Creatinine: 0.98 mg/dL (ref 0.60–1.30)
Glucose: 109 mg/dL — ABNORMAL HIGH (ref 65–99)
Osmolality: 276 (ref 275–301)
SGPT (ALT): 35 U/L (ref 12–78)
Sodium: 138 mmol/L (ref 136–145)
Total Protein: 7.1 g/dL (ref 6.4–8.2)

## 2013-04-29 DIAGNOSIS — C50419 Malignant neoplasm of upper-outer quadrant of unspecified female breast: Secondary | ICD-10-CM | POA: Diagnosis not present

## 2013-04-30 DIAGNOSIS — C50419 Malignant neoplasm of upper-outer quadrant of unspecified female breast: Secondary | ICD-10-CM | POA: Diagnosis not present

## 2013-04-30 DIAGNOSIS — H4010X0 Unspecified open-angle glaucoma, stage unspecified: Secondary | ICD-10-CM | POA: Diagnosis not present

## 2013-05-07 DIAGNOSIS — R0602 Shortness of breath: Secondary | ICD-10-CM | POA: Diagnosis not present

## 2013-05-07 DIAGNOSIS — R0789 Other chest pain: Secondary | ICD-10-CM | POA: Diagnosis not present

## 2013-05-07 DIAGNOSIS — I6529 Occlusion and stenosis of unspecified carotid artery: Secondary | ICD-10-CM | POA: Diagnosis not present

## 2013-05-07 DIAGNOSIS — C50419 Malignant neoplasm of upper-outer quadrant of unspecified female breast: Secondary | ICD-10-CM | POA: Diagnosis not present

## 2013-05-07 DIAGNOSIS — I059 Rheumatic mitral valve disease, unspecified: Secondary | ICD-10-CM | POA: Diagnosis not present

## 2013-05-13 DIAGNOSIS — C50419 Malignant neoplasm of upper-outer quadrant of unspecified female breast: Secondary | ICD-10-CM | POA: Diagnosis not present

## 2013-05-14 DIAGNOSIS — C50419 Malignant neoplasm of upper-outer quadrant of unspecified female breast: Secondary | ICD-10-CM | POA: Diagnosis not present

## 2013-05-16 ENCOUNTER — Ambulatory Visit: Payer: Self-pay | Admitting: Hematology and Oncology

## 2013-05-16 DIAGNOSIS — G35 Multiple sclerosis: Secondary | ICD-10-CM | POA: Diagnosis not present

## 2013-05-16 DIAGNOSIS — Z9010 Acquired absence of unspecified breast and nipple: Secondary | ICD-10-CM | POA: Diagnosis not present

## 2013-05-16 DIAGNOSIS — Z170 Estrogen receptor positive status [ER+]: Secondary | ICD-10-CM | POA: Diagnosis not present

## 2013-05-16 DIAGNOSIS — F17200 Nicotine dependence, unspecified, uncomplicated: Secondary | ICD-10-CM | POA: Diagnosis not present

## 2013-05-16 DIAGNOSIS — Z510 Encounter for antineoplastic radiation therapy: Secondary | ICD-10-CM | POA: Diagnosis not present

## 2013-05-16 DIAGNOSIS — G620 Drug-induced polyneuropathy: Secondary | ICD-10-CM | POA: Diagnosis not present

## 2013-05-16 DIAGNOSIS — K5090 Crohn's disease, unspecified, without complications: Secondary | ICD-10-CM | POA: Diagnosis not present

## 2013-05-16 DIAGNOSIS — Z79899 Other long term (current) drug therapy: Secondary | ICD-10-CM | POA: Diagnosis not present

## 2013-05-16 DIAGNOSIS — K5900 Constipation, unspecified: Secondary | ICD-10-CM | POA: Diagnosis not present

## 2013-05-16 DIAGNOSIS — C50419 Malignant neoplasm of upper-outer quadrant of unspecified female breast: Secondary | ICD-10-CM | POA: Diagnosis not present

## 2013-05-16 DIAGNOSIS — IMO0002 Reserved for concepts with insufficient information to code with codable children: Secondary | ICD-10-CM | POA: Diagnosis not present

## 2013-05-21 DIAGNOSIS — C50419 Malignant neoplasm of upper-outer quadrant of unspecified female breast: Secondary | ICD-10-CM | POA: Diagnosis not present

## 2013-05-23 LAB — CBC CANCER CENTER
BASOS ABS: 0 x10 3/mm (ref 0.0–0.1)
BASOS PCT: 0.3 %
EOS ABS: 0.2 x10 3/mm (ref 0.0–0.7)
Eosinophil %: 2.8 %
HCT: 39.9 % (ref 35.0–47.0)
HGB: 13.4 g/dL (ref 12.0–16.0)
Lymphocyte #: 0.9 x10 3/mm — ABNORMAL LOW (ref 1.0–3.6)
Lymphocyte %: 10.9 %
MCH: 29.6 pg (ref 26.0–34.0)
MCHC: 33.6 g/dL (ref 32.0–36.0)
MCV: 88 fL (ref 80–100)
MONOS PCT: 5.8 %
Monocyte #: 0.5 x10 3/mm (ref 0.2–0.9)
NEUTROS ABS: 6.6 x10 3/mm — AB (ref 1.4–6.5)
Neutrophil %: 80.2 %
Platelet: 194 x10 3/mm (ref 150–440)
RBC: 4.53 10*6/uL (ref 3.80–5.20)
RDW: 13.8 % (ref 11.5–14.5)
WBC: 8.2 x10 3/mm (ref 3.6–11.0)

## 2013-05-28 DIAGNOSIS — C50419 Malignant neoplasm of upper-outer quadrant of unspecified female breast: Secondary | ICD-10-CM | POA: Diagnosis not present

## 2013-05-30 LAB — CBC CANCER CENTER
BASOS ABS: 0 x10 3/mm (ref 0.0–0.1)
BASOS PCT: 0.5 %
EOS PCT: 3.8 %
Eosinophil #: 0.3 x10 3/mm (ref 0.0–0.7)
HCT: 41.4 % (ref 35.0–47.0)
HGB: 14 g/dL (ref 12.0–16.0)
LYMPHS ABS: 1.1 x10 3/mm (ref 1.0–3.6)
LYMPHS PCT: 15.2 %
MCH: 29.4 pg (ref 26.0–34.0)
MCHC: 33.7 g/dL (ref 32.0–36.0)
MCV: 87 fL (ref 80–100)
MONOS PCT: 7.3 %
Monocyte #: 0.5 x10 3/mm (ref 0.2–0.9)
NEUTROS PCT: 73.2 %
Neutrophil #: 5.3 x10 3/mm (ref 1.4–6.5)
Platelet: 193 x10 3/mm (ref 150–440)
RBC: 4.75 10*6/uL (ref 3.80–5.20)
RDW: 13.2 % (ref 11.5–14.5)
WBC: 7.3 x10 3/mm (ref 3.6–11.0)

## 2013-06-03 ENCOUNTER — Encounter: Payer: Self-pay | Admitting: Internal Medicine

## 2013-06-03 ENCOUNTER — Ambulatory Visit (INDEPENDENT_AMBULATORY_CARE_PROVIDER_SITE_OTHER): Payer: Medicare Other | Admitting: Internal Medicine

## 2013-06-03 VITALS — BP 130/64 | HR 84 | Temp 98.0°F | Ht 67.0 in | Wt 193.2 lb

## 2013-06-03 DIAGNOSIS — C50411 Malignant neoplasm of upper-outer quadrant of right female breast: Secondary | ICD-10-CM

## 2013-06-03 DIAGNOSIS — H409 Unspecified glaucoma: Secondary | ICD-10-CM

## 2013-06-03 DIAGNOSIS — L408 Other psoriasis: Secondary | ICD-10-CM

## 2013-06-03 DIAGNOSIS — E7800 Pure hypercholesterolemia, unspecified: Secondary | ICD-10-CM

## 2013-06-03 DIAGNOSIS — J45909 Unspecified asthma, uncomplicated: Secondary | ICD-10-CM | POA: Diagnosis not present

## 2013-06-03 DIAGNOSIS — N760 Acute vaginitis: Secondary | ICD-10-CM | POA: Diagnosis not present

## 2013-06-03 DIAGNOSIS — I10 Essential (primary) hypertension: Secondary | ICD-10-CM

## 2013-06-03 DIAGNOSIS — G35 Multiple sclerosis: Secondary | ICD-10-CM

## 2013-06-03 DIAGNOSIS — E039 Hypothyroidism, unspecified: Secondary | ICD-10-CM

## 2013-06-03 DIAGNOSIS — C50419 Malignant neoplasm of upper-outer quadrant of unspecified female breast: Secondary | ICD-10-CM

## 2013-06-03 DIAGNOSIS — L409 Psoriasis, unspecified: Secondary | ICD-10-CM

## 2013-06-03 NOTE — Progress Notes (Signed)
Subjective:    Patient ID: Haley Santos, female    DOB: 1945/11/17, 68 y.o.   MRN: [id number]  HPI 68 year old female with past history of asthma, tobacco abuse, hypercholesterolemia, hyperthyroidism s/p ablation and now maintained on synthroid and multiple sclerosis.   She comes in today for a scheduled follow up.  She was recently diagnosed with breast cancer.  Seeing oncology and Dr Tamala Julian.  Is currently receiving XRT.  Tolerating.  Energy is ok.  No increased cough or congestion.  No chest pain or tightness.  No nausea or vomiting.  No bowel change.  Recent hgb normal.  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  Since stopping, this is better.  Currently on diflucan for reoccurring yeast.  Still having some intermittent flares with vaginal burning and irritation.  Has seen dermatology - Dr Phillip Heal.  Diagnosed with psoriasis.  Overall she feels she is doing well.     Past Medical History  Diagnosis Date  . Asthma with bronchitis   . Multiple sclerosis 1993  . Hyperthyroidism     s/p ablation maintained on synthroid  . GERD (gastroesophageal reflux disease)   . Glaucoma   . Hypercholesterolemia   . Cancer     breast cancer   . Crohn's disease     Current Outpatient Prescriptions on File Prior to Visit  Medication Sig Dispense Refill  . ADVAIR DISKUS 250-50 MCG/DOSE AEPB INHALE 1 PUFF INTO THE LUNGS EVERY 12 HOURS  60 each  5  . cholecalciferol (VITAMIN D) 1000 UNITS tablet Take 2,000 Units by mouth daily.      . Cyanocobalamin (VITAMIN B 12 PO) Take by mouth daily.      Marland Kitchen levothyroxine (SYNTHROID, LEVOTHROID) 125 MCG tablet Take 1 tablet (125 mcg total) by mouth daily.  90 tablet  1  . Magnesium Oxide 400 MG CAPS Take 400 mg by mouth daily.      Marland Kitchen omeprazole (PRILOSEC OTC) 20 MG tablet Take 20 mg by mouth daily.      Marland Kitchen PROAIR HFA 108 (90 BASE) MCG/ACT inhaler Inhale 2 puffs into the lungs every 4 (four) hours as needed.       . travoprost, benzalkonium,  (TRAVATAN) 0.004 % ophthalmic solution 1 drop at bedtime.       No current facility-administered medications on file prior to visit.    Review of Systems Patient denies any headache or dizziness.  No chest pain, tightness or palpitations.  No increased shortness of breath.  No significant cough.  Still smoking.  Discussed with her today regarding the need to stop smoking.   No nausea or vomiting.  No abdominal pain or cramping.  No bowel change, such as diarrhea, constipation, BRBPR or melana.  Has MS.  Has declined any form of evaluation or treatment for her MS.  Feels stable.  "nerve pain" better.  No significant fatigue.  Vaginal irritation as outlined.        Objective:   Physical Exam  Filed Vitals:   06/03/13 0902  BP: 130/64  Pulse: 84  Temp: 98 F (26.20 C)   68 year old female in no acute distress.   HEENT:  Nares- clear.  Oropharynx - without lesions. NECK:  Supple.  Nontender.  Carotid bruit.   HEART:  Appears to be regular. LUNGS:  No crackles or wheezing audible.  Respirations even and unlabored.  RADIAL PULSE:  Equal bilaterally.    BREASTS:  No nipple discharge or nipple retraction present.  Could not appreciate any distinct nodules or axillary adenopathy.  Well healed incision site.  No increased erythema.   ABDOMEN:  Soft, nontender.  Bowel sounds present and normal.  No audible abdominal bruit.  GU:  Normal external genitalia.  Vaginal vault without lesions.  Could not appreciate any adnexal masses or tenderness.   KOH/wet prep obtained.    EXTREMITIES:  No increased edema present.  DP pulses palpable and equal bilaterally.          Assessment & Plan:  POSSIBLE SLEEP APNEA.  Sleep study does not meet criteria for obstructive sleep apnea.  We have discussed not sleeping supine.     ANEMIA.  No bowel change and no upper symptoms.  On iron now.  Most recent cbc wnl.  Saw GI.  Returned hemoccult cards.  F/u with GI planned.     SMOKING CESSATION.  Have discussed the  need to quit. I again discussed with her today.      HEALTH MAINTENANCE.  Physical 09/28/12.  Mammogram being followed by oncology.  Has seen GI.

## 2013-06-03 NOTE — Assessment & Plan Note (Signed)
Stable.  Follow.

## 2013-06-03 NOTE — Assessment & Plan Note (Signed)
Is s/p chemotherapy.  Now undergoing XRT.  Continues to f/u with oncology, Dr Tamala Julian and Dr Donella Stade.

## 2013-06-03 NOTE — Progress Notes (Signed)
Pre-visit discussion using our clinic review tool. No additional management support is needed unless otherwise documented below in the visit note.  

## 2013-06-03 NOTE — Assessment & Plan Note (Signed)
Uses clobetosol.  Follow.  Persistent vaginal irritation.  Exam as outlined.  No acute inflammation of the tissue.  KOH/wet prep sent.

## 2013-06-03 NOTE — Assessment & Plan Note (Signed)
On lisinopril.  Pressure ok.  Follow metabolic panel.

## 2013-06-03 NOTE — Assessment & Plan Note (Signed)
On thyroid supplementation.  Follow tsh.

## 2013-06-03 NOTE — Assessment & Plan Note (Signed)
Declines medication.  Low cholesterol diet and exercise.

## 2013-06-03 NOTE — Assessment & Plan Note (Signed)
Followed by opthalmology.

## 2013-06-03 NOTE — Assessment & Plan Note (Addendum)
Continue current inhaler regimen.  Needs to stop smoking.  Breathing stable.  Wanted to try a sample of symbicort.  Wants to see if cheaper.

## 2013-06-04 DIAGNOSIS — C50419 Malignant neoplasm of upper-outer quadrant of unspecified female breast: Secondary | ICD-10-CM | POA: Diagnosis not present

## 2013-06-05 ENCOUNTER — Other Ambulatory Visit: Payer: Self-pay | Admitting: *Deleted

## 2013-06-05 ENCOUNTER — Encounter: Payer: Self-pay | Admitting: *Deleted

## 2013-06-05 DIAGNOSIS — C50419 Malignant neoplasm of upper-outer quadrant of unspecified female breast: Secondary | ICD-10-CM | POA: Diagnosis not present

## 2013-06-05 DIAGNOSIS — Z170 Estrogen receptor positive status [ER+]: Secondary | ICD-10-CM | POA: Diagnosis not present

## 2013-06-05 LAB — CBC CANCER CENTER
BASOS PCT: 0.4 %
Basophil #: 0 x10 3/mm (ref 0.0–0.1)
EOS ABS: 0.2 x10 3/mm (ref 0.0–0.7)
Eosinophil %: 3.9 %
HCT: 40.5 % (ref 35.0–47.0)
HGB: 13.6 g/dL (ref 12.0–16.0)
LYMPHS ABS: 0.9 x10 3/mm — AB (ref 1.0–3.6)
LYMPHS PCT: 14.5 %
MCH: 29.1 pg (ref 26.0–34.0)
MCHC: 33.5 g/dL (ref 32.0–36.0)
MCV: 87 fL (ref 80–100)
Monocyte #: 0.5 x10 3/mm (ref 0.2–0.9)
Monocyte %: 7.8 %
Neutrophil #: 4.6 x10 3/mm (ref 1.4–6.5)
Neutrophil %: 73.4 %
PLATELETS: 186 x10 3/mm (ref 150–440)
RBC: 4.67 10*6/uL (ref 3.80–5.20)
RDW: 13.5 % (ref 11.5–14.5)
WBC: 6.3 x10 3/mm (ref 3.6–11.0)

## 2013-06-05 LAB — COMPREHENSIVE METABOLIC PANEL
ALBUMIN: 3.5 g/dL (ref 3.4–5.0)
ALT: 26 U/L (ref 12–78)
Alkaline Phosphatase: 106 U/L
Anion Gap: 9 (ref 7–16)
BUN: 14 mg/dL (ref 7–18)
Bilirubin,Total: 0.2 mg/dL (ref 0.2–1.0)
Calcium, Total: 9 mg/dL (ref 8.5–10.1)
Chloride: 102 mmol/L (ref 98–107)
Co2: 28 mmol/L (ref 21–32)
Creatinine: 0.98 mg/dL (ref 0.60–1.30)
GLUCOSE: 115 mg/dL — AB (ref 65–99)
Osmolality: 279 (ref 275–301)
POTASSIUM: 4.1 mmol/L (ref 3.5–5.1)
SGOT(AST): 21 U/L (ref 15–37)
Sodium: 139 mmol/L (ref 136–145)
TOTAL PROTEIN: 7 g/dL (ref 6.4–8.2)

## 2013-06-05 LAB — WET PREP BY MOLECULAR PROBE
Candida species: NEGATIVE
Gardnerella vaginalis: POSITIVE — AB
Trichomonas vaginosis: NEGATIVE

## 2013-06-05 MED ORDER — METRONIDAZOLE 0.75 % VA GEL: 1.0000 | Freq: Two times a day (BID) | VAGINAL | Status: DC

## 2013-06-12 DIAGNOSIS — C50419 Malignant neoplasm of upper-outer quadrant of unspecified female breast: Secondary | ICD-10-CM | POA: Diagnosis not present

## 2013-06-13 LAB — CBC CANCER CENTER
BASOS ABS: 0 x10 3/mm (ref 0.0–0.1)
Basophil %: 0.5 %
EOS ABS: 0.3 x10 3/mm (ref 0.0–0.7)
EOS PCT: 5.1 %
HCT: 41.1 % (ref 35.0–47.0)
HGB: 13.7 g/dL (ref 12.0–16.0)
LYMPHS ABS: 0.8 x10 3/mm — AB (ref 1.0–3.6)
LYMPHS PCT: 14.9 %
MCH: 28.9 pg (ref 26.0–34.0)
MCHC: 33.4 g/dL (ref 32.0–36.0)
MCV: 87 fL (ref 80–100)
Monocyte #: 0.3 x10 3/mm (ref 0.2–0.9)
Monocyte %: 5.6 %
NEUTROS PCT: 73.9 %
Neutrophil #: 4.2 x10 3/mm (ref 1.4–6.5)
PLATELETS: 187 x10 3/mm (ref 150–440)
RBC: 4.75 10*6/uL (ref 3.80–5.20)
RDW: 13.4 % (ref 11.5–14.5)
WBC: 5.7 x10 3/mm (ref 3.6–11.0)

## 2013-06-16 ENCOUNTER — Ambulatory Visit: Payer: Self-pay | Admitting: Hematology and Oncology

## 2013-06-16 DIAGNOSIS — C50419 Malignant neoplasm of upper-outer quadrant of unspecified female breast: Secondary | ICD-10-CM | POA: Diagnosis not present

## 2013-06-16 DIAGNOSIS — Z170 Estrogen receptor positive status [ER+]: Secondary | ICD-10-CM | POA: Diagnosis not present

## 2013-06-16 DIAGNOSIS — Z09 Encounter for follow-up examination after completed treatment for conditions other than malignant neoplasm: Secondary | ICD-10-CM | POA: Diagnosis not present

## 2013-06-16 DIAGNOSIS — Z510 Encounter for antineoplastic radiation therapy: Secondary | ICD-10-CM | POA: Diagnosis not present

## 2013-06-17 ENCOUNTER — Telehealth: Payer: Self-pay | Admitting: Internal Medicine

## 2013-06-17 MED ORDER — BUDESONIDE-FORMOTEROL FUMARATE 160-4.5 MCG/ACT IN AERO: 2.0000 | INHALATION_SPRAY | Freq: Two times a day (BID) | RESPIRATORY_TRACT | Status: DC

## 2013-06-17 NOTE — Telephone Encounter (Signed)
The Symbicort is working well she would like a prescription called into her pharmacy

## 2013-06-17 NOTE — Telephone Encounter (Signed)
I sent in rx for symbicort.

## 2013-06-18 DIAGNOSIS — C50419 Malignant neoplasm of upper-outer quadrant of unspecified female breast: Secondary | ICD-10-CM | POA: Diagnosis not present

## 2013-06-20 DIAGNOSIS — C50419 Malignant neoplasm of upper-outer quadrant of unspecified female breast: Secondary | ICD-10-CM | POA: Diagnosis not present

## 2013-06-20 LAB — CBC CANCER CENTER
BASOS ABS: 0 x10 3/mm (ref 0.0–0.1)
Basophil %: 0.7 %
Eosinophil #: 0.4 x10 3/mm (ref 0.0–0.7)
Eosinophil %: 5.8 %
HCT: 42.3 % (ref 35.0–47.0)
HGB: 14 g/dL (ref 12.0–16.0)
LYMPHS ABS: 1 x10 3/mm (ref 1.0–3.6)
Lymphocyte %: 14.9 %
MCH: 28.5 pg (ref 26.0–34.0)
MCHC: 33.2 g/dL (ref 32.0–36.0)
MCV: 86 fL (ref 80–100)
MONO ABS: 0.5 x10 3/mm (ref 0.2–0.9)
Monocyte %: 7.7 %
NEUTROS PCT: 70.9 %
Neutrophil #: 4.6 x10 3/mm (ref 1.4–6.5)
Platelet: 198 x10 3/mm (ref 150–440)
RBC: 4.93 10*6/uL (ref 3.80–5.20)
RDW: 13.6 % (ref 11.5–14.5)
WBC: 6.5 x10 3/mm (ref 3.6–11.0)

## 2013-06-25 DIAGNOSIS — C50419 Malignant neoplasm of upper-outer quadrant of unspecified female breast: Secondary | ICD-10-CM | POA: Diagnosis not present

## 2013-06-27 LAB — CBC CANCER CENTER
Basophil #: 0 x10 3/mm (ref 0.0–0.1)
Basophil %: 0.5 %
Eosinophil #: 0.3 x10 3/mm (ref 0.0–0.7)
Eosinophil %: 3.9 %
HCT: 42.7 % (ref 35.0–47.0)
HGB: 14.3 g/dL (ref 12.0–16.0)
LYMPHS ABS: 0.9 x10 3/mm — AB (ref 1.0–3.6)
LYMPHS PCT: 11.5 %
MCH: 28.7 pg (ref 26.0–34.0)
MCHC: 33.5 g/dL (ref 32.0–36.0)
MCV: 86 fL (ref 80–100)
Monocyte #: 0.5 x10 3/mm (ref 0.2–0.9)
Monocyte %: 6.5 %
NEUTROS PCT: 77.6 %
Neutrophil #: 6.1 x10 3/mm (ref 1.4–6.5)
Platelet: 201 x10 3/mm (ref 150–440)
RBC: 4.99 10*6/uL (ref 3.80–5.20)
RDW: 13.8 % (ref 11.5–14.5)
WBC: 7.8 x10 3/mm (ref 3.6–11.0)

## 2013-07-03 DIAGNOSIS — C50419 Malignant neoplasm of upper-outer quadrant of unspecified female breast: Secondary | ICD-10-CM | POA: Diagnosis not present

## 2013-07-08 DIAGNOSIS — C50919 Malignant neoplasm of unspecified site of unspecified female breast: Secondary | ICD-10-CM | POA: Diagnosis not present

## 2013-07-14 ENCOUNTER — Ambulatory Visit: Payer: Self-pay | Admitting: Hematology and Oncology

## 2013-07-14 DIAGNOSIS — Z79899 Other long term (current) drug therapy: Secondary | ICD-10-CM | POA: Diagnosis not present

## 2013-07-14 DIAGNOSIS — C50419 Malignant neoplasm of upper-outer quadrant of unspecified female breast: Secondary | ICD-10-CM | POA: Diagnosis not present

## 2013-07-14 DIAGNOSIS — Z9010 Acquired absence of unspecified breast and nipple: Secondary | ICD-10-CM | POA: Diagnosis not present

## 2013-07-14 DIAGNOSIS — F17200 Nicotine dependence, unspecified, uncomplicated: Secondary | ICD-10-CM | POA: Diagnosis not present

## 2013-07-14 DIAGNOSIS — Z9221 Personal history of antineoplastic chemotherapy: Secondary | ICD-10-CM | POA: Diagnosis not present

## 2013-07-14 DIAGNOSIS — J069 Acute upper respiratory infection, unspecified: Secondary | ICD-10-CM | POA: Diagnosis not present

## 2013-07-14 DIAGNOSIS — R209 Unspecified disturbances of skin sensation: Secondary | ICD-10-CM | POA: Diagnosis not present

## 2013-07-14 DIAGNOSIS — G35 Multiple sclerosis: Secondary | ICD-10-CM | POA: Diagnosis not present

## 2013-07-14 DIAGNOSIS — Z170 Estrogen receptor positive status [ER+]: Secondary | ICD-10-CM | POA: Diagnosis not present

## 2013-07-14 DIAGNOSIS — Z923 Personal history of irradiation: Secondary | ICD-10-CM | POA: Diagnosis not present

## 2013-07-15 ENCOUNTER — Telehealth: Payer: Self-pay | Admitting: Internal Medicine

## 2013-07-15 NOTE — Telephone Encounter (Signed)
Please advise. Note: patient states may leave specific message, not just callback instructions, on that phone number

## 2013-07-15 NOTE — Telephone Encounter (Signed)
Needs to be seen.  If wheezing, etc - needs eval.

## 2013-07-15 NOTE — Telephone Encounter (Signed)
Pt coming tomorrow @ noon

## 2013-07-15 NOTE — Telephone Encounter (Signed)
Patient Information:  Caller Name: Haley Santos  Phone: [phone]  Patient: Haley Santos  Gender: Female  DOB: 07-Apr-1946  Age: 68 Years  PCP: Haley Santos  Office Follow Up:  Does the office need to follow up with this patient?: Yes  Instructions For The Office: Appt workin/Rx/callback krs/can  RN Note:  Completed radiation therapy 2 weeks ago.  States had appt scheduled 07/16/13 with oncologist, but the office cancelled her appt and rescheduled for 07/19/13.  States symptoms of cough are persistent enough to become worrisome to her.  Using her inhaler q 6 h or so and this keeps wheezing controlled.  Afebrile.  Per colds protocol, emerrgent symptoms denied; due to history of lung cancer, advised appt within 24 hours.   No appts available within dispositioned time frame in Epic.  Patient declines appt tomorrow at another location; wants antibiotic, prednisone course called in.  Uses [facility].  Info to office for provider review/Rx/callback.  May reach patient at  [phone]; patient states may leave specific message, not just callback instructions, on that phone number.  krs/can  Symptoms  Reason For Call & Symptoms: URI  Reviewed Health History In EMR: Yes  Reviewed Medications In EMR: Yes  Reviewed Allergies In EMR: Yes  Reviewed Surgeries / Procedures: Yes  Date of Onset of Symptoms: 07/04/2013  Guideline(s) Used:  Colds  Disposition Per Guideline:   Home Care  Reason For Disposition Reached:   Colds with no complications  Advice Given:  N/A  Patient Will Follow Care Advice:  YES

## 2013-07-16 ENCOUNTER — Encounter: Payer: Self-pay | Admitting: Internal Medicine

## 2013-07-16 ENCOUNTER — Ambulatory Visit (INDEPENDENT_AMBULATORY_CARE_PROVIDER_SITE_OTHER): Payer: Medicare Other | Admitting: Internal Medicine

## 2013-07-16 VITALS — BP 124/66 | HR 82 | Temp 98.3°F | Resp 16 | Wt 192.0 lb

## 2013-07-16 DIAGNOSIS — J45909 Unspecified asthma, uncomplicated: Secondary | ICD-10-CM | POA: Diagnosis not present

## 2013-07-16 DIAGNOSIS — C50419 Malignant neoplasm of upper-outer quadrant of unspecified female breast: Secondary | ICD-10-CM | POA: Diagnosis not present

## 2013-07-16 DIAGNOSIS — J209 Acute bronchitis, unspecified: Secondary | ICD-10-CM | POA: Diagnosis not present

## 2013-07-16 DIAGNOSIS — C50411 Malignant neoplasm of upper-outer quadrant of right female breast: Secondary | ICD-10-CM

## 2013-07-16 MED ORDER — AMOXICILLIN-POT CLAVULANATE 875-125 MG PO TABS: 1.0000 | ORAL_TABLET | Freq: Two times a day (BID) | ORAL | Status: DC

## 2013-07-16 MED ORDER — PREDNISONE 10 MG PO TABS: ORAL_TABLET | ORAL | Status: DC

## 2013-07-16 MED ORDER — FLUTICASONE PROPIONATE 50 MCG/ACT NA SUSP: 2.0000 | Freq: Every day | NASAL | Status: DC

## 2013-07-16 NOTE — Patient Instructions (Signed)
Mucinex DM in the am and Robitussin DM in the evening.  Continue saline nasal sray.    Nasacort nasal spray (is otc).  See if this is cheaper then Flonase prescription.

## 2013-07-16 NOTE — Progress Notes (Signed)
Pre visit review using our clinic review tool, if applicable. No additional management support is needed unless otherwise documented below in the visit note. 

## 2013-07-18 ENCOUNTER — Ambulatory Visit: Payer: Medicare Other | Admitting: Internal Medicine

## 2013-07-19 LAB — CBC CANCER CENTER
BASOS ABS: 0 x10 3/mm (ref 0.0–0.1)
BASOS PCT: 0.2 %
EOS PCT: 0 %
Eosinophil #: 0 x10 3/mm (ref 0.0–0.7)
HCT: 41.3 % (ref 35.0–47.0)
HGB: 13.8 g/dL (ref 12.0–16.0)
LYMPHS PCT: 6.3 %
Lymphocyte #: 0.6 x10 3/mm — ABNORMAL LOW (ref 1.0–3.6)
MCH: 28.5 pg (ref 26.0–34.0)
MCHC: 33.4 g/dL (ref 32.0–36.0)
MCV: 85 fL (ref 80–100)
MONO ABS: 0.3 x10 3/mm (ref 0.2–0.9)
MONOS PCT: 3.3 %
NEUTROS PCT: 90.2 %
Neutrophil #: 9.1 x10 3/mm — ABNORMAL HIGH (ref 1.4–6.5)
Platelet: 230 x10 3/mm (ref 150–440)
RBC: 4.84 10*6/uL (ref 3.80–5.20)
RDW: 14.4 % (ref 11.5–14.5)
WBC: 10.1 x10 3/mm (ref 3.6–11.0)

## 2013-07-19 LAB — COMPREHENSIVE METABOLIC PANEL
ALK PHOS: 116 U/L
Albumin: 3.7 g/dL (ref 3.4–5.0)
Anion Gap: 8 (ref 7–16)
BILIRUBIN TOTAL: 0.4 mg/dL (ref 0.2–1.0)
BUN: 19 mg/dL — ABNORMAL HIGH (ref 7–18)
CHLORIDE: 101 mmol/L (ref 98–107)
Calcium, Total: 9.7 mg/dL (ref 8.5–10.1)
Co2: 29 mmol/L (ref 21–32)
Creatinine: 1.04 mg/dL (ref 0.60–1.30)
EGFR (African American): >60
EGFR (Non-African Amer.): 56 — ABNORMAL LOW
Glucose: 115 mg/dL — ABNORMAL HIGH (ref 65–99)
Osmolality: 279 (ref 275–301)
Potassium: 4.2 mmol/L (ref 3.5–5.1)
SGOT(AST): 18 U/L (ref 15–37)
SGPT (ALT): 23 U/L (ref 12–78)
Sodium: 138 mmol/L (ref 136–145)
TOTAL PROTEIN: 7.5 g/dL (ref 6.4–8.2)

## 2013-07-20 ENCOUNTER — Encounter: Payer: Self-pay | Admitting: Internal Medicine

## 2013-07-20 NOTE — Assessment & Plan Note (Signed)
Is s/p chemotherapy.  Completed XRT.  Continues to f/u with oncology, Dr Tamala Julian and Dr Donella Stade.

## 2013-07-20 NOTE — Assessment & Plan Note (Signed)
Bronchitis and sinusitis as outlined.  augmentin as directed.  Nasonex and saline nasal spray as directed.  Prednisone taper as directed.  Continue symbicort and her rescue inhaler as directed.  mucinex DM and robitussin DM as directed.  Follow.

## 2013-07-20 NOTE — Progress Notes (Signed)
  Subjective:    Patient ID: [name], female    DOB: Oct 20, 1945, 68 y.o.   MRN: [id number]  Wheezing  Associated symptoms include coughing.  Cough Associated symptoms include wheezing.  68 year old female with past history of asthma, tobacco abuse, hypercholesterolemia, hyperthyroidism s/p ablation and now maintained on synthroid and multiple sclerosis.   She comes in today as a work in with concerns regarding increased chest congestion and cough.  She was recently diagnosed with breast cancer.  Seeing oncology and Dr Tamala Julian.  Finished XRT.   States symptoms started 10 days ago.  Have progressively worsened.  Reports increased chest and nasal congestion.  Using symbicort bid and using her rescue inhaler bid.  Some wheezing.  Is eating.  Some decreased appetite.  No vommiting.      Past Medical History  Diagnosis Date  . Asthma with bronchitis   . Multiple sclerosis 1993  . Hyperthyroidism     s/p ablation maintained on synthroid  . GERD (gastroesophageal reflux disease)   . Glaucoma   . Hypercholesterolemia   . Cancer     breast cancer   . Crohn's disease     Current Outpatient Prescriptions on File Prior to Visit  Medication Sig Dispense Refill  . budesonide-formoterol (SYMBICORT) 160-4.5 MCG/ACT inhaler Inhale 2 puffs into the lungs 2 (two) times daily.  1 Inhaler  3  . cholecalciferol (VITAMIN D) 1000 UNITS tablet Take 2,000 Units by mouth daily.      . Cyanocobalamin (VITAMIN B 12 PO) Take by mouth daily.      Marland Kitchen levothyroxine (SYNTHROID, LEVOTHROID) 125 MCG tablet Take 1 tablet (125 mcg total) by mouth daily.  90 tablet  1  . Magnesium Oxide 400 MG CAPS Take 400 mg by mouth daily.      Marland Kitchen omeprazole (PRILOSEC OTC) 20 MG tablet Take 20 mg by mouth daily.      Marland Kitchen PROAIR HFA 108 (90 BASE) MCG/ACT inhaler Inhale 2 puffs into the lungs every 4 (four) hours as needed.       . travoprost, benzalkonium, (TRAVATAN) 0.004 % ophthalmic solution 1 drop at bedtime.       No current  facility-administered medications on file prior to visit.    Review of Systems  Respiratory: Positive for cough and wheezing.   Patient denies any headache or dizziness.  No chest pain, tightness or palpitations.  No increased shortness of breath.  Does report increased cough and congestion.  Cough productive of thick mucus.  Increased nasal congestion.  Some wheezing.  Decreased appetite.  No vomiting.  Has just started using saline and nasonex.         Objective:   Physical Exam  Filed Vitals:   07/16/13 1216  BP: 124/66  Pulse: 82  Temp: 98.3 F (36.8 C)  Resp: 48   68 year old female in no acute distress.   HEENT:  Nares- slightly erythematous turbinates.   Oropharynx - without lesions.  Minimal tenderness to palpation over the sinuses.  NECK:  Supple.  Nontender.    HEART:  Appears to be regular. LUNGS:  No crackles.  Increased congestion - clears with coughing.  Increased cough with expiration and forced expiration.          Assessment & Plan:  SMOKING CESSATION.  Have discussed the need to quit.     HEALTH MAINTENANCE.  Physical 09/28/12.  Mammogram being followed by oncology.  Has seen GI.

## 2013-07-20 NOTE — Assessment & Plan Note (Signed)
Treat current infection.  Continue inhalers as directed.

## 2013-08-14 ENCOUNTER — Ambulatory Visit: Payer: Self-pay | Admitting: Hematology and Oncology

## 2013-08-29 ENCOUNTER — Other Ambulatory Visit: Payer: Self-pay | Admitting: Internal Medicine

## 2013-08-29 DIAGNOSIS — I119 Hypertensive heart disease without heart failure: Secondary | ICD-10-CM | POA: Diagnosis not present

## 2013-08-29 DIAGNOSIS — I059 Rheumatic mitral valve disease, unspecified: Secondary | ICD-10-CM | POA: Diagnosis not present

## 2013-08-29 DIAGNOSIS — E782 Mixed hyperlipidemia: Secondary | ICD-10-CM | POA: Diagnosis not present

## 2013-09-26 ENCOUNTER — Encounter: Payer: Self-pay | Admitting: Internal Medicine

## 2013-09-26 ENCOUNTER — Ambulatory Visit (INDEPENDENT_AMBULATORY_CARE_PROVIDER_SITE_OTHER): Payer: Medicare Other | Admitting: Internal Medicine

## 2013-09-26 VITALS — BP 130/60 | HR 79 | Temp 98.0°F | Ht 66.0 in | Wt 191.8 lb

## 2013-09-26 DIAGNOSIS — I10 Essential (primary) hypertension: Secondary | ICD-10-CM

## 2013-09-26 DIAGNOSIS — E7800 Pure hypercholesterolemia, unspecified: Secondary | ICD-10-CM

## 2013-09-26 DIAGNOSIS — C50411 Malignant neoplasm of upper-outer quadrant of right female breast: Secondary | ICD-10-CM

## 2013-09-26 DIAGNOSIS — M2550 Pain in unspecified joint: Secondary | ICD-10-CM

## 2013-09-26 DIAGNOSIS — L409 Psoriasis, unspecified: Secondary | ICD-10-CM

## 2013-09-26 DIAGNOSIS — G35 Multiple sclerosis: Secondary | ICD-10-CM | POA: Diagnosis not present

## 2013-09-26 DIAGNOSIS — J45909 Unspecified asthma, uncomplicated: Secondary | ICD-10-CM

## 2013-09-26 DIAGNOSIS — C50419 Malignant neoplasm of upper-outer quadrant of unspecified female breast: Secondary | ICD-10-CM

## 2013-09-26 DIAGNOSIS — E039 Hypothyroidism, unspecified: Secondary | ICD-10-CM

## 2013-09-26 DIAGNOSIS — L408 Other psoriasis: Secondary | ICD-10-CM

## 2013-09-26 NOTE — Progress Notes (Signed)
Pre visit review using our clinic review tool, if applicable. No additional management support is needed unless otherwise documented below in the visit note. 

## 2013-10-06 ENCOUNTER — Encounter: Payer: Self-pay | Admitting: Internal Medicine

## 2013-10-06 DIAGNOSIS — M2550 Pain in unspecified joint: Secondary | ICD-10-CM | POA: Insufficient documentation

## 2013-10-06 NOTE — Assessment & Plan Note (Signed)
Joint pain as outlined.  Declines further w/up.  Follow.

## 2013-10-06 NOTE — Assessment & Plan Note (Signed)
Is s/p chemotherapy.  Completed XRT.  Continues to f/u with oncology, Dr Tamala Julian and Dr Donella Stade.

## 2013-10-06 NOTE — Assessment & Plan Note (Signed)
Uses clobetosol.  Follow.  Persistent vaginal irritation.  Seeing dermatology.

## 2013-10-06 NOTE — Assessment & Plan Note (Signed)
Declines medication.  Low cholesterol diet and exercise.

## 2013-10-06 NOTE — Assessment & Plan Note (Signed)
On lisinopril.  Pressure ok.  Follow metabolic panel.

## 2013-10-06 NOTE — Assessment & Plan Note (Signed)
Continue inhalers as directed.  Breathing stable.

## 2013-10-06 NOTE — Assessment & Plan Note (Signed)
On thyroid supplementation.  Follow tsh.

## 2013-10-06 NOTE — Progress Notes (Signed)
Subjective:    Patient ID: Haley Santos, female    DOB: 07-10-1945, 68 y.o.   MRN: [id number]  HPI 68 year old female with past history of asthma, tobacco abuse, hypercholesterolemia, multiple sclerosis, hyperthyroidism s/p ablation and now maintained on synthroid.   She comes in today to follow up on these issues as well as for a complete physical exam.   She was recently diagnosed with breast cancer.  Seeing oncology and Dr Tamala Julian.  Received XRT.  Energy is ok.  No increased cough or congestion.  No chest pain or tightness.  No nausea or vomiting.  No bowel change.  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  Since stopping, this is better.  Still having some intermittent flares with vaginal burning and irritation.  Has seen dermatology - Dr Phillip Heal.  Diagnosed with psoriasis.  Overall she feels she is doing well.  Still smoking.  We discussed the need to stop.  She reports some joint pain - mostly involving her shoulders.  Also reports some left hip soreness.  After she sits for a while and then stands, increased discomfort.  No injury.  Some increased pain involving her tailbone.     Past Medical History  Diagnosis Date  . Asthma with bronchitis   . Multiple sclerosis 1993  . Hyperthyroidism     s/p ablation maintained on synthroid  . GERD (gastroesophageal reflux disease)   . Glaucoma   . Hypercholesterolemia   . Cancer     breast cancer   . Crohn's disease     Current Outpatient Prescriptions on File Prior to Visit  Medication Sig Dispense Refill  . cholecalciferol (VITAMIN D) 1000 UNITS tablet Take 2,000 Units by mouth daily.      . Cyanocobalamin (VITAMIN B 12 PO) Take by mouth daily.      Marland Kitchen levothyroxine (SYNTHROID, LEVOTHROID) 125 MCG tablet TAKE 1 TABLET BY MOUTH DAILY.  90 tablet  0  . Magnesium Oxide 400 MG CAPS Take 400 mg by mouth daily.      Marland Kitchen omeprazole (PRILOSEC OTC) 20 MG tablet Take 20 mg by mouth daily.      Marland Kitchen PROAIR HFA 108 (90 BASE) MCG/ACT  inhaler Inhale 2 puffs into the lungs every 4 (four) hours as needed.       . travoprost, benzalkonium, (TRAVATAN) 0.004 % ophthalmic solution 1 drop at bedtime.       No current facility-administered medications on file prior to visit.    Review of Systems Patient denies any headache or dizziness.  No chest pain, tightness or palpitations.  No increased shortness of breath.  No significant cough.  Still smoking.  Discussed with her today regarding the need to stop smoking.   No nausea or vomiting.  No abdominal pain or cramping.  No bowel change, such as diarrhea, constipation, BRBPR or melana.  Has MS.  Has declined any form of evaluation or treatment for her MS.  Feels stable.  Vaginal irritation as outlined.  Saw dermatology.  Joint pains as outlined.       Objective:   Physical Exam  Filed Vitals:   09/26/13 1052  BP: 130/60  Pulse: 79  Temp: 98 F (36.7 C)   Blood pressure recheck:  69/19  68 year old female in no acute distress.   HEENT:  Nares- clear.  Oropharynx - without lesions. NECK:  Supple.  Nontender.  Carotid bruit.   HEART:  Appears to be regular. LUNGS:  No crackles or  wheezing audible.  Respirations even and unlabored.  RADIAL PULSE:  Equal bilaterally.    BREASTS:  No nipple discharge or nipple retraction present.  Could not appreciate any distinct nodules or axillary adenopathy.  Well healed incision site.  No increased erythema.   ABDOMEN:  Soft, nontender.  Bowel sounds present and normal.  No audible abdominal bruit.  GU:  Not performed.     EXTREMITIES:  No increased edema present.  DP pulses palpable and equal bilaterally.          Assessment & Plan:  POSSIBLE SLEEP APNEA.  Sleep study does not meet criteria for obstructive sleep apnea.  We have discussed not sleeping supine.     ANEMIA.  No bowel change and no upper symptoms.  On iron now.  Most recent cbc wnl.  Has seen GI.      SMOKING CESSATION.  Have discussed the need to quit. I again discussed  with her today.      HEALTH MAINTENANCE.  Physical today.  Mammogram being followed by oncology.  Has seen GI.    I spent 25 minutes with the patient and more than 50% of the time was spent in consultation regarding the above.

## 2013-10-06 NOTE — Assessment & Plan Note (Signed)
Stable.  Follow.

## 2013-10-22 ENCOUNTER — Ambulatory Visit: Payer: Self-pay | Admitting: Hematology and Oncology

## 2013-10-23 ENCOUNTER — Ambulatory Visit: Payer: Self-pay | Admitting: Surgery

## 2013-10-23 DIAGNOSIS — R922 Inconclusive mammogram: Secondary | ICD-10-CM | POA: Diagnosis not present

## 2013-10-23 DIAGNOSIS — Z853 Personal history of malignant neoplasm of breast: Secondary | ICD-10-CM | POA: Diagnosis not present

## 2013-10-24 ENCOUNTER — Ambulatory Visit: Payer: Self-pay | Admitting: Hematology and Oncology

## 2013-10-24 DIAGNOSIS — G35 Multiple sclerosis: Secondary | ICD-10-CM | POA: Diagnosis not present

## 2013-10-24 DIAGNOSIS — IMO0002 Reserved for concepts with insufficient information to code with codable children: Secondary | ICD-10-CM | POA: Diagnosis not present

## 2013-10-24 DIAGNOSIS — R05 Cough: Secondary | ICD-10-CM | POA: Diagnosis not present

## 2013-10-24 DIAGNOSIS — Z170 Estrogen receptor positive status [ER+]: Secondary | ICD-10-CM | POA: Diagnosis not present

## 2013-10-24 DIAGNOSIS — F17200 Nicotine dependence, unspecified, uncomplicated: Secondary | ICD-10-CM | POA: Diagnosis not present

## 2013-10-24 DIAGNOSIS — K5090 Crohn's disease, unspecified, without complications: Secondary | ICD-10-CM | POA: Diagnosis not present

## 2013-10-24 DIAGNOSIS — R209 Unspecified disturbances of skin sensation: Secondary | ICD-10-CM | POA: Diagnosis not present

## 2013-10-24 DIAGNOSIS — Z9221 Personal history of antineoplastic chemotherapy: Secondary | ICD-10-CM | POA: Diagnosis not present

## 2013-10-24 DIAGNOSIS — C50919 Malignant neoplasm of unspecified site of unspecified female breast: Secondary | ICD-10-CM | POA: Diagnosis not present

## 2013-10-24 DIAGNOSIS — Z9010 Acquired absence of unspecified breast and nipple: Secondary | ICD-10-CM | POA: Diagnosis not present

## 2013-10-24 DIAGNOSIS — Z923 Personal history of irradiation: Secondary | ICD-10-CM | POA: Diagnosis not present

## 2013-10-24 DIAGNOSIS — R059 Cough, unspecified: Secondary | ICD-10-CM | POA: Diagnosis not present

## 2013-10-25 ENCOUNTER — Other Ambulatory Visit: Payer: Self-pay | Admitting: Internal Medicine

## 2013-10-25 DIAGNOSIS — G35 Multiple sclerosis: Secondary | ICD-10-CM | POA: Diagnosis not present

## 2013-10-25 DIAGNOSIS — IMO0002 Reserved for concepts with insufficient information to code with codable children: Secondary | ICD-10-CM | POA: Diagnosis not present

## 2013-10-25 DIAGNOSIS — Z170 Estrogen receptor positive status [ER+]: Secondary | ICD-10-CM | POA: Diagnosis not present

## 2013-10-25 DIAGNOSIS — R059 Cough, unspecified: Secondary | ICD-10-CM | POA: Diagnosis not present

## 2013-10-25 DIAGNOSIS — R05 Cough: Secondary | ICD-10-CM | POA: Diagnosis not present

## 2013-10-25 DIAGNOSIS — C50919 Malignant neoplasm of unspecified site of unspecified female breast: Secondary | ICD-10-CM | POA: Diagnosis not present

## 2013-10-25 LAB — CBC CANCER CENTER
Basophil #: 0 x10 3/mm (ref 0.0–0.1)
Basophil %: 0.6 %
EOS ABS: 0.3 x10 3/mm (ref 0.0–0.7)
EOS PCT: 4.1 %
HCT: 40.1 % (ref 35.0–47.0)
HGB: 13.6 g/dL (ref 12.0–16.0)
Lymphocyte #: 1.1 x10 3/mm (ref 1.0–3.6)
Lymphocyte %: 18.1 %
MCH: 29.6 pg (ref 26.0–34.0)
MCHC: 34.1 g/dL (ref 32.0–36.0)
MCV: 87 fL (ref 80–100)
MONO ABS: 0.4 x10 3/mm (ref 0.2–0.9)
MONOS PCT: 5.7 %
NEUTROS ABS: 4.5 x10 3/mm (ref 1.4–6.5)
NEUTROS PCT: 71.5 %
PLATELETS: 201 x10 3/mm (ref 150–440)
RBC: 4.61 10*6/uL (ref 3.80–5.20)
RDW: 13.5 % (ref 11.5–14.5)
WBC: 6.3 x10 3/mm (ref 3.6–11.0)

## 2013-10-25 LAB — COMPREHENSIVE METABOLIC PANEL
ALT: 21 U/L (ref 12–78)
ANION GAP: 10 (ref 7–16)
AST: 17 U/L (ref 15–37)
Albumin: 3.6 g/dL (ref 3.4–5.0)
Alkaline Phosphatase: 101 U/L
BUN: 18 mg/dL (ref 7–18)
Bilirubin,Total: 0.5 mg/dL (ref 0.2–1.0)
CREATININE: 1.01 mg/dL (ref 0.60–1.30)
Calcium, Total: 9.8 mg/dL (ref 8.5–10.1)
Chloride: 102 mmol/L (ref 98–107)
Co2: 27 mmol/L (ref 21–32)
EGFR (Non-African Amer.): 58 — ABNORMAL LOW
Glucose: 111 mg/dL — ABNORMAL HIGH (ref 65–99)
Osmolality: 280 (ref 275–301)
POTASSIUM: 4.2 mmol/L (ref 3.5–5.1)
Sodium: 139 mmol/L (ref 136–145)
Total Protein: 7.1 g/dL (ref 6.4–8.2)

## 2013-10-28 LAB — CANCER ANTIGEN 27.29: CA 27.29: 38 U/mL (ref 0.0–38.6)

## 2013-10-29 DIAGNOSIS — H4010X0 Unspecified open-angle glaucoma, stage unspecified: Secondary | ICD-10-CM | POA: Diagnosis not present

## 2013-11-13 ENCOUNTER — Ambulatory Visit: Payer: Self-pay | Admitting: Hematology and Oncology

## 2013-12-09 ENCOUNTER — Other Ambulatory Visit: Payer: Self-pay | Admitting: *Deleted

## 2013-12-09 MED ORDER — LEVOTHYROXINE SODIUM 125 MCG PO TABS: ORAL_TABLET | ORAL | Status: DC

## 2013-12-23 ENCOUNTER — Other Ambulatory Visit: Payer: Self-pay | Admitting: Internal Medicine

## 2013-12-25 ENCOUNTER — Encounter: Payer: Self-pay | Admitting: Internal Medicine

## 2013-12-25 DIAGNOSIS — C50411 Malignant neoplasm of upper-outer quadrant of right female breast: Secondary | ICD-10-CM

## 2013-12-25 DIAGNOSIS — Z853 Personal history of malignant neoplasm of breast: Secondary | ICD-10-CM | POA: Diagnosis not present

## 2014-01-14 ENCOUNTER — Ambulatory Visit: Payer: Self-pay | Admitting: Family Medicine

## 2014-01-14 DIAGNOSIS — E7800 Pure hypercholesterolemia, unspecified: Secondary | ICD-10-CM | POA: Diagnosis not present

## 2014-01-14 DIAGNOSIS — E039 Hypothyroidism, unspecified: Secondary | ICD-10-CM | POA: Diagnosis not present

## 2014-01-14 DIAGNOSIS — L851 Acquired keratosis [keratoderma] palmaris et plantaris: Secondary | ICD-10-CM | POA: Diagnosis not present

## 2014-01-14 DIAGNOSIS — Z9089 Acquired absence of other organs: Secondary | ICD-10-CM | POA: Diagnosis not present

## 2014-01-14 DIAGNOSIS — M25519 Pain in unspecified shoulder: Secondary | ICD-10-CM | POA: Diagnosis not present

## 2014-01-14 DIAGNOSIS — N6009 Solitary cyst of unspecified breast: Secondary | ICD-10-CM | POA: Diagnosis not present

## 2014-01-14 DIAGNOSIS — R978 Other abnormal tumor markers: Secondary | ICD-10-CM | POA: Diagnosis not present

## 2014-01-14 DIAGNOSIS — Z923 Personal history of irradiation: Secondary | ICD-10-CM | POA: Diagnosis not present

## 2014-01-14 DIAGNOSIS — Z9010 Acquired absence of unspecified breast and nipple: Secondary | ICD-10-CM | POA: Diagnosis not present

## 2014-01-14 DIAGNOSIS — D649 Anemia, unspecified: Secondary | ICD-10-CM | POA: Diagnosis not present

## 2014-01-14 DIAGNOSIS — K5090 Crohn's disease, unspecified, without complications: Secondary | ICD-10-CM | POA: Diagnosis not present

## 2014-01-14 DIAGNOSIS — K219 Gastro-esophageal reflux disease without esophagitis: Secondary | ICD-10-CM | POA: Diagnosis not present

## 2014-01-14 DIAGNOSIS — G35 Multiple sclerosis: Secondary | ICD-10-CM | POA: Diagnosis not present

## 2014-01-14 DIAGNOSIS — R0602 Shortness of breath: Secondary | ICD-10-CM | POA: Diagnosis not present

## 2014-01-14 DIAGNOSIS — Z79899 Other long term (current) drug therapy: Secondary | ICD-10-CM | POA: Diagnosis not present

## 2014-01-14 DIAGNOSIS — Z170 Estrogen receptor positive status [ER+]: Secondary | ICD-10-CM | POA: Diagnosis not present

## 2014-01-14 DIAGNOSIS — C50919 Malignant neoplasm of unspecified site of unspecified female breast: Secondary | ICD-10-CM | POA: Diagnosis not present

## 2014-01-14 DIAGNOSIS — J45909 Unspecified asthma, uncomplicated: Secondary | ICD-10-CM | POA: Diagnosis not present

## 2014-01-14 DIAGNOSIS — Z9221 Personal history of antineoplastic chemotherapy: Secondary | ICD-10-CM | POA: Diagnosis not present

## 2014-01-14 DIAGNOSIS — R059 Cough, unspecified: Secondary | ICD-10-CM | POA: Diagnosis not present

## 2014-01-14 DIAGNOSIS — F17200 Nicotine dependence, unspecified, uncomplicated: Secondary | ICD-10-CM | POA: Diagnosis not present

## 2014-01-14 DIAGNOSIS — G8918 Other acute postprocedural pain: Secondary | ICD-10-CM | POA: Diagnosis not present

## 2014-01-14 DIAGNOSIS — G589 Mononeuropathy, unspecified: Secondary | ICD-10-CM | POA: Diagnosis not present

## 2014-01-27 ENCOUNTER — Other Ambulatory Visit: Payer: Self-pay | Admitting: Internal Medicine

## 2014-01-27 NOTE — Telephone Encounter (Signed)
Left message to call back, not on current med list

## 2014-01-28 NOTE — Telephone Encounter (Signed)
Spoke with pt, still taking Advair bid. Rx sent to pharmacy by escript

## 2014-01-29 DIAGNOSIS — Z170 Estrogen receptor positive status [ER+]: Secondary | ICD-10-CM | POA: Diagnosis not present

## 2014-01-29 DIAGNOSIS — C50919 Malignant neoplasm of unspecified site of unspecified female breast: Secondary | ICD-10-CM | POA: Diagnosis not present

## 2014-01-29 LAB — CBC CANCER CENTER
BASOS PCT: 0.5 %
Basophil #: 0 x10 3/mm (ref 0.0–0.1)
Eosinophil #: 0.2 x10 3/mm (ref 0.0–0.7)
Eosinophil %: 3.2 %
HCT: 42.3 % (ref 35.0–47.0)
HGB: 14.3 g/dL (ref 12.0–16.0)
Lymphocyte #: 1.1 x10 3/mm (ref 1.0–3.6)
Lymphocyte %: 15.4 %
MCH: 29.2 pg (ref 26.0–34.0)
MCHC: 33.8 g/dL (ref 32.0–36.0)
MCV: 87 fL (ref 80–100)
Monocyte #: 0.4 x10 3/mm (ref 0.2–0.9)
Monocyte %: 5 %
Neutrophil #: 5.4 x10 3/mm (ref 1.4–6.5)
Neutrophil %: 75.9 %
Platelet: 184 x10 3/mm (ref 150–440)
RBC: 4.89 10*6/uL (ref 3.80–5.20)
RDW: 13.8 % (ref 11.5–14.5)
WBC: 7.1 x10 3/mm (ref 3.6–11.0)

## 2014-01-29 LAB — COMPREHENSIVE METABOLIC PANEL
ALK PHOS: 110 U/L
Albumin: 3.6 g/dL (ref 3.4–5.0)
Anion Gap: 6 — ABNORMAL LOW (ref 7–16)
BILIRUBIN TOTAL: 0.4 mg/dL (ref 0.2–1.0)
BUN: 13 mg/dL (ref 7–18)
CALCIUM: 9.5 mg/dL (ref 8.5–10.1)
Chloride: 103 mmol/L (ref 98–107)
Co2: 30 mmol/L (ref 21–32)
Creatinine: 1.02 mg/dL (ref 0.60–1.30)
EGFR (African American): >60
EGFR (Non-African Amer.): 57 — ABNORMAL LOW
GLUCOSE: 88 mg/dL (ref 65–99)
OSMOLALITY: 277 (ref 275–301)
Potassium: 4.2 mmol/L (ref 3.5–5.1)
SGOT(AST): 20 U/L (ref 15–37)
SGPT (ALT): 26 U/L
SODIUM: 139 mmol/L (ref 136–145)
Total Protein: 7.3 g/dL (ref 6.4–8.2)

## 2014-01-30 LAB — CANCER ANTIGEN 27.29: CA 27.29: 41.3 U/mL — AB (ref 0.0–38.6)

## 2014-02-13 ENCOUNTER — Ambulatory Visit: Payer: Self-pay | Admitting: Family Medicine

## 2014-03-17 ENCOUNTER — Encounter: Payer: Self-pay | Admitting: Internal Medicine

## 2014-03-31 ENCOUNTER — Ambulatory Visit (INDEPENDENT_AMBULATORY_CARE_PROVIDER_SITE_OTHER): Payer: Medicare Other | Admitting: Internal Medicine

## 2014-03-31 ENCOUNTER — Encounter (INDEPENDENT_AMBULATORY_CARE_PROVIDER_SITE_OTHER): Payer: Self-pay

## 2014-03-31 ENCOUNTER — Encounter: Payer: Self-pay | Admitting: Internal Medicine

## 2014-03-31 VITALS — BP 134/76 | HR 83 | Temp 97.8°F | Ht 66.0 in | Wt 201.5 lb

## 2014-03-31 DIAGNOSIS — R0989 Other specified symptoms and signs involving the circulatory and respiratory systems: Secondary | ICD-10-CM | POA: Diagnosis not present

## 2014-03-31 DIAGNOSIS — E7800 Pure hypercholesterolemia, unspecified: Secondary | ICD-10-CM

## 2014-03-31 DIAGNOSIS — G35 Multiple sclerosis: Secondary | ICD-10-CM

## 2014-03-31 DIAGNOSIS — E039 Hypothyroidism, unspecified: Secondary | ICD-10-CM

## 2014-03-31 DIAGNOSIS — I10 Essential (primary) hypertension: Secondary | ICD-10-CM | POA: Diagnosis not present

## 2014-03-31 DIAGNOSIS — C50411 Malignant neoplasm of upper-outer quadrant of right female breast: Secondary | ICD-10-CM

## 2014-03-31 NOTE — Progress Notes (Signed)
Pre visit review using our clinic review tool, if applicable. No additional management support is needed unless otherwise documented below in the visit note. 

## 2014-03-31 NOTE — Progress Notes (Signed)
Subjective:    Patient ID: Haley Santos, female    DOB: 02-15-1946, 68 y.o.   MRN: [id number]  HPI 68 year old female with past history of asthma, tobacco abuse, hypercholesterolemia, multiple sclerosis, hyperthyroidism s/p ablation and now maintained on synthroid.   She comes in today for a scheduled follow up.   She was recently diagnosed with breast cancer.  Seeing oncology and Dr Tamala Julian.  Received XRT.  Energy is ok.  Has improved.  No increased cough or congestion.  Breathing stable.  No chest pain or tightness.  No nausea or vomiting.  No bowel change.  Overall she feels she is doing well.  Still smoking.  We have discussed the need to stop. Some hip pain.  Better with ambulation.  State she feels better than she has in a long time.  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  Followed by oncology.  Tumor marker elevated.  Being followed at the cancer center.     Past Medical History  Diagnosis Date  . Asthma with bronchitis   . Multiple sclerosis 1993  . Hyperthyroidism     s/p ablation maintained on synthroid  . GERD (gastroesophageal reflux disease)   . Glaucoma   . Hypercholesterolemia   . Cancer     breast cancer   . Crohn's disease     Current Outpatient Prescriptions on File Prior to Visit  Medication Sig Dispense Refill  . ADVAIR DISKUS 250-50 MCG/DOSE AEPB INHALE ONE DOSE BY MOUTH TWICE DAILY *RINSE MOUTH AFTER USE* 60 each 5  . cholecalciferol (VITAMIN D) 1000 UNITS tablet Take 2,000 Units by mouth daily.    . Cyanocobalamin (VITAMIN B 12 PO) Take by mouth daily.    Marland Kitchen levothyroxine (SYNTHROID, LEVOTHROID) 125 MCG tablet TAKE 1 TABLET BY MOUTH DAILY. 90 tablet 1  . Magnesium Oxide 400 MG CAPS Take 400 mg by mouth daily.    Marland Kitchen omeprazole (PRILOSEC OTC) 20 MG tablet Take 20 mg by mouth daily.    Marland Kitchen PROAIR HFA 108 (90 BASE) MCG/ACT inhaler INHALE 2 PUFFS INTO THE LUNGS EVERY 6 HOURS AS NEEDED. 8.5 each 1  . travoprost, benzalkonium, (TRAVATAN) 0.004 %  ophthalmic solution 1 drop at bedtime.     No current facility-administered medications on file prior to visit.    Review of Systems Patient denies any headache or dizziness.  No chest pain, tightness or palpitations.  No increased shortness of breath.  No significant cough.  Still smoking.  Have discussed with her regarding the need to stop smoking.   No nausea or vomiting.  No abdominal pain or cramping.  No bowel change, such as diarrhea, constipation, BRBPR or melana.  Has MS.  Has declined any form of evaluation or treatment for her MS.  Feels stable.  Hip pain as outlined.       Objective:   Physical Exam  Filed Vitals:   03/31/14 0955  BP: 134/76  Pulse: 83  Temp: 97.8 F (36.6 C)   Blood pressure recheck: 49/37  68 year old female in no acute distress.   HEENT:  Nares- clear.  Oropharynx - without lesions. NECK:  Supple.  Nontender.  Carotid bruit.   HEART:  Appears to be regular. LUNGS:  No crackles or wheezing audible.  Respirations even and unlabored.  RADIAL PULSE:  Equal bilaterally.  ABDOMEN:  Soft, nontender.  Bowel sounds present and normal.  No audible abdominal bruit.   EXTREMITIES:  No increased edema present.  DP  pulses palpable and equal bilaterally.          Assessment & Plan:  POSSIBLE SLEEP APNEA.  Sleep study does not meet criteria for obstructive sleep apnea.  We have discussed not sleeping supine.     ANEMIA.  No bowel change and no upper symptoms.  On iron now.  Most recent cbc wnl.  Has seen GI.      SMOKING CESSATION.  Have discussed the need to quit.     Essential hypertension Blood pressure doing well.  Follow.  Gets labs through oncology.   Hypothyroidism, unspecified hypothyroidism type On thyroid replacement.  Follow tsh.    Multiple sclerosis Declines any formal evaluation or scanning.  Declines neurology evaluation.  Follow.   Hypercholesterolemia Cholesterol is elevated.  She declines to have this checked.  Follow.  Declines  treatment  Breast cancer of upper-outer quadrant of right female breast Followed at the cancer center.  See above.   Carotid bruit, unspecified laterality Check carotid ultrasound.   - Ambulatory referral to Vascular Surgery   HEALTH MAINTENANCE.  Physical 09/26/13.  Mammogram being followed by oncology.  Has seen GI.    I spent 25 minutes with the patient and more than 50% of the time was spent in consultation regarding the above.

## 2014-04-03 DIAGNOSIS — I6529 Occlusion and stenosis of unspecified carotid artery: Secondary | ICD-10-CM | POA: Diagnosis not present

## 2014-04-13 ENCOUNTER — Telehealth: Payer: Self-pay | Admitting: Internal Medicine

## 2014-04-13 NOTE — Telephone Encounter (Signed)
Notify pt that I reviewed her recent carotid ultrasound results and her ultrasound reveals some build up in the left internal carotid artery.  Nothing that would require intervention at this time, but needs to be followed.  Right internal carotid artery - no significant blockage.  I would like to refer her to vascular surgery - just for them to follow her carotids.  May want to recheck n 6 months.  If agreeable, let me know and I will place the order for the referral.

## 2014-04-15 NOTE — Telephone Encounter (Signed)
Pt notified of results & wanted to know why it took so long to hear from it (she thought everything was fine). She stated this was done over a week ago & they told her we would get the results the same day or next day. Pt would like to know the "percentages" of her carotids. She states that we could leave a detailed message anytime on her voicemail. Pt also mentioned that she will think about the referral & let us know if she decides to proceed.

## 2014-04-15 NOTE — Telephone Encounter (Signed)
To my knowledge she had the ultrasound last week.  I was not in the office Wednesday pm, Thursday or Friday.  She heard about the results at the beginning of this week.  The ultrasound was placed to be scanned.  See if they can send [place] another copy so we can give her the percents.  Thanks.

## 2014-04-17 NOTE — Telephone Encounter (Signed)
Records requested

## 2014-04-18 NOTE — Telephone Encounter (Signed)
Pt notified of results & percentages

## 2014-04-18 NOTE — Telephone Encounter (Signed)
LMTCB with Carotid percentages.  Interpretation:  1-49% right internal carotid artery stenosis  50-69% left internal carotid artery stenosis  Significant left external carotid artery stenosis  Per Dr. Nicki Reaper, she would not do anything at this point other that follow with vascular surgeon

## 2014-04-18 NOTE — Telephone Encounter (Signed)
Copy also mailed to patient (see below)

## 2014-04-23 ENCOUNTER — Encounter: Payer: Self-pay | Admitting: Internal Medicine

## 2014-05-01 ENCOUNTER — Ambulatory Visit: Payer: Self-pay | Admitting: Hematology and Oncology

## 2014-05-01 DIAGNOSIS — G629 Polyneuropathy, unspecified: Secondary | ICD-10-CM | POA: Diagnosis not present

## 2014-05-01 DIAGNOSIS — Z79899 Other long term (current) drug therapy: Secondary | ICD-10-CM | POA: Diagnosis not present

## 2014-05-01 DIAGNOSIS — R05 Cough: Secondary | ICD-10-CM | POA: Diagnosis not present

## 2014-05-01 DIAGNOSIS — F17210 Nicotine dependence, cigarettes, uncomplicated: Secondary | ICD-10-CM | POA: Diagnosis not present

## 2014-05-01 DIAGNOSIS — N6489 Other specified disorders of breast: Secondary | ICD-10-CM | POA: Diagnosis not present

## 2014-05-01 DIAGNOSIS — R0602 Shortness of breath: Secondary | ICD-10-CM | POA: Diagnosis not present

## 2014-05-01 DIAGNOSIS — Z853 Personal history of malignant neoplasm of breast: Secondary | ICD-10-CM | POA: Diagnosis not present

## 2014-05-01 LAB — CBC CANCER CENTER
BASOS PCT: 0.5 %
Basophil #: 0 x10 3/mm (ref 0.0–0.1)
Eosinophil #: 0.2 x10 3/mm (ref 0.0–0.7)
Eosinophil %: 3.7 %
HCT: 40.2 % (ref 35.0–47.0)
HGB: 13.7 g/dL (ref 12.0–16.0)
Lymphocyte #: 1.1 x10 3/mm (ref 1.0–3.6)
Lymphocyte %: 18.6 %
MCH: 28.8 pg (ref 26.0–34.0)
MCHC: 34 g/dL (ref 32.0–36.0)
MCV: 85 fL (ref 80–100)
MONO ABS: 0.3 x10 3/mm (ref 0.2–0.9)
MONOS PCT: 5.8 %
Neutrophil #: 4.3 x10 3/mm (ref 1.4–6.5)
Neutrophil %: 71.4 %
PLATELETS: 192 x10 3/mm (ref 150–440)
RBC: 4.74 10*6/uL (ref 3.80–5.20)
RDW: 13.9 % (ref 11.5–14.5)
WBC: 6 x10 3/mm (ref 3.6–11.0)

## 2014-05-01 LAB — COMPREHENSIVE METABOLIC PANEL
ALBUMIN: 3.6 g/dL (ref 3.4–5.0)
ANION GAP: 7 (ref 7–16)
Alkaline Phosphatase: 113 U/L
BILIRUBIN TOTAL: 0.4 mg/dL (ref 0.2–1.0)
BUN: 13 mg/dL (ref 7–18)
CO2: 30 mmol/L (ref 21–32)
Calcium, Total: 9.1 mg/dL (ref 8.5–10.1)
Chloride: 102 mmol/L (ref 98–107)
Creatinine: 0.91 mg/dL (ref 0.60–1.30)
EGFR (African American): >60
GLUCOSE: 107 mg/dL — AB (ref 65–99)
Osmolality: 278 (ref 275–301)
Potassium: 4 mmol/L (ref 3.5–5.1)
SGOT(AST): 17 U/L (ref 15–37)
SGPT (ALT): 28 U/L
Sodium: 139 mmol/L (ref 136–145)
Total Protein: 7.1 g/dL (ref 6.4–8.2)

## 2014-05-02 LAB — CANCER ANTIGEN 27.29: CA 27.29: 35.1 U/mL (ref 0.0–38.6)

## 2014-05-05 DIAGNOSIS — I10 Essential (primary) hypertension: Secondary | ICD-10-CM | POA: Diagnosis not present

## 2014-05-05 DIAGNOSIS — E785 Hyperlipidemia, unspecified: Secondary | ICD-10-CM | POA: Diagnosis not present

## 2014-05-05 DIAGNOSIS — E669 Obesity, unspecified: Secondary | ICD-10-CM | POA: Diagnosis not present

## 2014-05-05 DIAGNOSIS — I6529 Occlusion and stenosis of unspecified carotid artery: Secondary | ICD-10-CM | POA: Diagnosis not present

## 2014-05-16 ENCOUNTER — Ambulatory Visit: Payer: Self-pay | Admitting: Hematology and Oncology

## 2014-05-20 ENCOUNTER — Other Ambulatory Visit: Payer: Self-pay | Admitting: *Deleted

## 2014-05-20 MED ORDER — ALBUTEROL SULFATE HFA 108 (90 BASE) MCG/ACT IN AERS: 2.0000 | INHALATION_SPRAY | Freq: Four times a day (QID) | RESPIRATORY_TRACT | Status: DC | PRN

## 2014-05-29 ENCOUNTER — Other Ambulatory Visit: Payer: Self-pay | Admitting: Internal Medicine

## 2014-06-16 ENCOUNTER — Ambulatory Visit: Payer: Self-pay | Admitting: Hematology and Oncology

## 2014-06-25 ENCOUNTER — Other Ambulatory Visit: Payer: Self-pay | Admitting: Internal Medicine

## 2014-07-08 LAB — HEPATIC FUNCTION PANEL
ALK PHOS: 98 U/L (ref 25–125)
ALT: 18 U/L (ref 7–35)
AST: 20 U/L (ref 13–35)
Bilirubin, Total: 0.7 mg/dL

## 2014-07-08 LAB — BASIC METABOLIC PANEL
BUN: 13 mg/dL (ref 4–21)
Creatinine: 0.9 mg/dL (ref 0.5–1.1)
Glucose: 108 mg/dL
Potassium: 4.3 mmol/L (ref 3.4–5.3)
Sodium: 134 mmol/L — AB (ref 137–147)

## 2014-07-08 LAB — CBC AND DIFFERENTIAL
HCT: 41 % (ref 36–46)
Hemoglobin: 14.1 g/dL (ref 12.0–16.0)
PLATELETS: 192 10*3/uL (ref 150–399)
WBC: 8.5 10*3/mL

## 2014-07-08 LAB — TSH: TSH: 0.84 u[IU]/mL (ref 0.41–5.90)

## 2014-07-26 ENCOUNTER — Other Ambulatory Visit: Payer: Self-pay | Admitting: Internal Medicine

## 2014-07-28 NOTE — Telephone Encounter (Signed)
Called pt, states [facility] does all her labs. No labs in chart since 2014, sent request to [facility] to get most recent labs including TSH

## 2014-07-28 NOTE — Telephone Encounter (Signed)
Fax received from [facility], states she has not had a TSH drawn there. Pt notified. States she will contact [facility], has appt there on 07/31/14 and will see if she can request to have it drawn with her other labs. Will contact us if an order is needed. Advised I would send refill to pharmacy because she only has one tablet left.

## 2014-07-31 ENCOUNTER — Ambulatory Visit
Admit: 2014-07-31 | Disposition: A | Discharge status: Home / Self Care | Payer: Self-pay | Attending: Hematology and Oncology | Admitting: Hematology and Oncology

## 2014-07-31 DIAGNOSIS — Z9221 Personal history of antineoplastic chemotherapy: Secondary | ICD-10-CM | POA: Diagnosis not present

## 2014-07-31 DIAGNOSIS — G35 Multiple sclerosis: Secondary | ICD-10-CM | POA: Diagnosis not present

## 2014-07-31 DIAGNOSIS — Z9049 Acquired absence of other specified parts of digestive tract: Secondary | ICD-10-CM | POA: Diagnosis not present

## 2014-07-31 DIAGNOSIS — E039 Hypothyroidism, unspecified: Secondary | ICD-10-CM | POA: Diagnosis not present

## 2014-07-31 DIAGNOSIS — R200 Anesthesia of skin: Secondary | ICD-10-CM | POA: Diagnosis not present

## 2014-07-31 DIAGNOSIS — R202 Paresthesia of skin: Secondary | ICD-10-CM | POA: Diagnosis not present

## 2014-07-31 DIAGNOSIS — C50911 Malignant neoplasm of unspecified site of right female breast: Secondary | ICD-10-CM | POA: Diagnosis not present

## 2014-07-31 DIAGNOSIS — D649 Anemia, unspecified: Secondary | ICD-10-CM | POA: Diagnosis not present

## 2014-07-31 DIAGNOSIS — K5090 Crohn's disease, unspecified, without complications: Secondary | ICD-10-CM | POA: Diagnosis not present

## 2014-07-31 DIAGNOSIS — R0602 Shortness of breath: Secondary | ICD-10-CM | POA: Diagnosis not present

## 2014-07-31 DIAGNOSIS — I6522 Occlusion and stenosis of left carotid artery: Secondary | ICD-10-CM | POA: Diagnosis not present

## 2014-07-31 DIAGNOSIS — G629 Polyneuropathy, unspecified: Secondary | ICD-10-CM | POA: Diagnosis not present

## 2014-07-31 DIAGNOSIS — L409 Psoriasis, unspecified: Secondary | ICD-10-CM | POA: Diagnosis not present

## 2014-07-31 DIAGNOSIS — H409 Unspecified glaucoma: Secondary | ICD-10-CM | POA: Diagnosis not present

## 2014-07-31 DIAGNOSIS — K219 Gastro-esophageal reflux disease without esophagitis: Secondary | ICD-10-CM | POA: Diagnosis not present

## 2014-07-31 DIAGNOSIS — E780 Pure hypercholesterolemia: Secondary | ICD-10-CM | POA: Diagnosis not present

## 2014-07-31 DIAGNOSIS — F17210 Nicotine dependence, cigarettes, uncomplicated: Secondary | ICD-10-CM | POA: Diagnosis not present

## 2014-07-31 DIAGNOSIS — Z170 Estrogen receptor positive status [ER+]: Secondary | ICD-10-CM | POA: Diagnosis not present

## 2014-07-31 DIAGNOSIS — Z79899 Other long term (current) drug therapy: Secondary | ICD-10-CM | POA: Diagnosis not present

## 2014-07-31 DIAGNOSIS — Z9011 Acquired absence of right breast and nipple: Secondary | ICD-10-CM | POA: Diagnosis not present

## 2014-07-31 DIAGNOSIS — R05 Cough: Secondary | ICD-10-CM | POA: Diagnosis not present

## 2014-08-04 ENCOUNTER — Other Ambulatory Visit: Payer: Self-pay | Admitting: Internal Medicine

## 2014-08-04 DIAGNOSIS — Z853 Personal history of malignant neoplasm of breast: Secondary | ICD-10-CM | POA: Diagnosis not present

## 2014-08-06 ENCOUNTER — Telehealth: Payer: Self-pay | Admitting: *Deleted

## 2014-08-06 ENCOUNTER — Encounter: Payer: Self-pay | Admitting: Internal Medicine

## 2014-08-06 NOTE — Telephone Encounter (Signed)
Pt dropped off recent labs drawn for Dr. Nicki Reaper to review. Results abstracted & placed in green folder

## 2014-08-15 ENCOUNTER — Ambulatory Visit
Admit: 2014-08-15 | Disposition: A | Discharge status: Home / Self Care | Payer: Self-pay | Attending: Hematology and Oncology | Admitting: Hematology and Oncology

## 2014-08-28 ENCOUNTER — Other Ambulatory Visit: Payer: Self-pay | Admitting: Internal Medicine

## 2014-08-29 ENCOUNTER — Other Ambulatory Visit: Payer: Self-pay | Admitting: Hematology and Oncology

## 2014-08-29 DIAGNOSIS — Z853 Personal history of malignant neoplasm of breast: Secondary | ICD-10-CM

## 2014-09-01 ENCOUNTER — Encounter (HOSPITAL_COMMUNITY)
Admission: EM | Disposition: A | Discharge status: Home / Self Care | Payer: Medicare Other | Source: Other Acute Inpatient Hospital | Attending: Cardiology

## 2014-09-01 ENCOUNTER — Encounter (HOSPITAL_COMMUNITY): Payer: Self-pay | Admitting: Emergency Medicine

## 2014-09-01 ENCOUNTER — Inpatient Hospital Stay (HOSPITAL_COMMUNITY)
Admission: EM | Admit: 2014-09-01 | Discharge: 2014-09-04 | DRG: 247 | Disposition: A | Discharge status: Home / Self Care | Payer: Medicare Other | Source: Other Acute Inpatient Hospital | Attending: Cardiology | Admitting: Cardiology

## 2014-09-01 DIAGNOSIS — I471 Supraventricular tachycardia: Secondary | ICD-10-CM | POA: Diagnosis not present

## 2014-09-01 DIAGNOSIS — Z885 Allergy status to narcotic agent status: Secondary | ICD-10-CM | POA: Diagnosis not present

## 2014-09-01 DIAGNOSIS — E7800 Pure hypercholesterolemia, unspecified: Secondary | ICD-10-CM | POA: Diagnosis present

## 2014-09-01 DIAGNOSIS — Z888 Allergy status to other drugs, medicaments and biological substances status: Secondary | ICD-10-CM | POA: Diagnosis not present

## 2014-09-01 DIAGNOSIS — I2510 Atherosclerotic heart disease of native coronary artery without angina pectoris: Secondary | ICD-10-CM | POA: Diagnosis not present

## 2014-09-01 DIAGNOSIS — R7302 Impaired glucose tolerance (oral): Secondary | ICD-10-CM | POA: Diagnosis present

## 2014-09-01 DIAGNOSIS — F17210 Nicotine dependence, cigarettes, uncomplicated: Secondary | ICD-10-CM | POA: Diagnosis present

## 2014-09-01 DIAGNOSIS — I2119 ST elevation (STEMI) myocardial infarction involving other coronary artery of inferior wall: Principal | ICD-10-CM | POA: Diagnosis present

## 2014-09-01 DIAGNOSIS — K219 Gastro-esophageal reflux disease without esophagitis: Secondary | ICD-10-CM | POA: Diagnosis present

## 2014-09-01 DIAGNOSIS — Z833 Family history of diabetes mellitus: Secondary | ICD-10-CM | POA: Diagnosis not present

## 2014-09-01 DIAGNOSIS — G35 Multiple sclerosis: Secondary | ICD-10-CM | POA: Diagnosis present

## 2014-09-01 DIAGNOSIS — Z720 Tobacco use: Secondary | ICD-10-CM | POA: Diagnosis not present

## 2014-09-01 DIAGNOSIS — H409 Unspecified glaucoma: Secondary | ICD-10-CM | POA: Diagnosis present

## 2014-09-01 DIAGNOSIS — Z881 Allergy status to other antibiotic agents status: Secondary | ICD-10-CM | POA: Diagnosis not present

## 2014-09-01 DIAGNOSIS — I10 Essential (primary) hypertension: Secondary | ICD-10-CM | POA: Diagnosis not present

## 2014-09-01 DIAGNOSIS — Z91013 Allergy to seafood: Secondary | ICD-10-CM | POA: Diagnosis not present

## 2014-09-01 DIAGNOSIS — E890 Postprocedural hypothyroidism: Secondary | ICD-10-CM | POA: Diagnosis present

## 2014-09-01 DIAGNOSIS — J45909 Unspecified asthma, uncomplicated: Secondary | ICD-10-CM | POA: Diagnosis present

## 2014-09-01 DIAGNOSIS — Z8249 Family history of ischemic heart disease and other diseases of the circulatory system: Secondary | ICD-10-CM

## 2014-09-01 DIAGNOSIS — R079 Chest pain, unspecified: Secondary | ICD-10-CM | POA: Diagnosis not present

## 2014-09-01 DIAGNOSIS — K5090 Crohn's disease, unspecified, without complications: Secondary | ICD-10-CM | POA: Diagnosis present

## 2014-09-01 DIAGNOSIS — I2111 ST elevation (STEMI) myocardial infarction involving right coronary artery: Secondary | ICD-10-CM | POA: Diagnosis not present

## 2014-09-01 DIAGNOSIS — E7439 Other disorders of intestinal carbohydrate absorption: Secondary | ICD-10-CM | POA: Diagnosis present

## 2014-09-01 DIAGNOSIS — E039 Hypothyroidism, unspecified: Secondary | ICD-10-CM | POA: Diagnosis present

## 2014-09-01 DIAGNOSIS — I2584 Coronary atherosclerosis due to calcified coronary lesion: Secondary | ICD-10-CM | POA: Diagnosis present

## 2014-09-01 DIAGNOSIS — Z853 Personal history of malignant neoplasm of breast: Secondary | ICD-10-CM

## 2014-09-01 DIAGNOSIS — I213 ST elevation (STEMI) myocardial infarction of unspecified site: Secondary | ICD-10-CM

## 2014-09-01 HISTORY — DX: Tobacco use: Z72.0

## 2014-09-01 HISTORY — DX: Malignant neoplasm of unspecified site of unspecified female breast: C50.919

## 2014-09-01 HISTORY — PX: LEFT HEART CATHETERIZATION WITH CORONARY ANGIOGRAM: SHX5451

## 2014-09-01 HISTORY — DX: Atherosclerotic heart disease of native coronary artery without angina pectoris: I25.10

## 2014-09-01 HISTORY — DX: Impaired glucose tolerance (oral): R73.02

## 2014-09-01 SURGERY — LEFT HEART CATHETERIZATION WITH CORONARY ANGIOGRAM

## 2014-09-01 MED ORDER — HEPARIN SODIUM (PORCINE) 5000 UNIT/ML IJ SOLN
INTRAMUSCULAR | Status: AC
  Administered 2014-09-01: 4000 [IU]
  Filled 2014-09-01: qty 1

## 2014-09-01 MED ORDER — HEPARIN (PORCINE) IN NACL 2-0.9 UNIT/ML-% IJ SOLN
INTRAMUSCULAR | Status: AC
  Filled 2014-09-01: qty 1000

## 2014-09-01 MED ORDER — BIVALIRUDIN 250 MG IV SOLR
INTRAVENOUS | Status: AC
  Filled 2014-09-01: qty 250

## 2014-09-01 MED ORDER — MIDAZOLAM HCL 2 MG/2ML IJ SOLN
INTRAMUSCULAR | Status: AC
  Filled 2014-09-01: qty 2

## 2014-09-01 MED ORDER — FENTANYL CITRATE (PF) 100 MCG/2ML IJ SOLN
INTRAMUSCULAR | Status: AC
  Filled 2014-09-01: qty 2

## 2014-09-01 MED ORDER — LIDOCAINE HCL (PF) 1 % IJ SOLN
INTRAMUSCULAR | Status: AC
  Filled 2014-09-01: qty 30

## 2014-09-01 MED ORDER — NITROGLYCERIN 1 MG/10 ML FOR IR/CATH LAB
INTRA_ARTERIAL | Status: AC
  Filled 2014-09-01: qty 10

## 2014-09-01 MED ORDER — HEPARIN SODIUM (PORCINE) 1000 UNIT/ML IJ SOLN: 4000.0000 [IU] | Freq: Once | INTRAMUSCULAR | Status: AC

## 2014-09-01 NOTE — ED Provider Notes (Signed)
CSN: [id number]     Arrival date & time 09/01/14  2227 History   First MD Initiated Contact with Patient 09/01/14 2231     Chief Complaint  Patient presents with  . Chest Pain    The patient called EMS and was complaining of mid sternum chest pain radiating down both arms.  THe patient denies any other symptoms.     (Consider location/radiation/quality/duration/timing/severity/associated sxs/prior Treatment) Patient is a 69 y.o. female presenting with chest pain. The history is provided by the patient.  Chest Pain She had onset about 4:30 PM of a dull, aching midsternal pain which radiated down both arms. Pain was waxing and waning until about 9 PM wind area became steady and more severe. She rated pain at 8/10. There is no associated dyspnea or nausea but there was mild diaphoresis. She had taken a total of 8 baby aspirin during the course of the day. EMS gave her nitroglycerin which gave her relief of pain but pain recurred. She was given an additional nitroglycerin and has been pain-free since then. She does have cardiac risk factor of hyperlipidemia and tobacco abuse but denies history of hypertension or diabetes. She is status post right mastectomy for cancer and has a Mediport in place. Other than nitroglycerin, she did notice anything making the pain better or worse.  Past Medical History  Diagnosis Date  . Asthma with bronchitis   . Multiple sclerosis 1993  . Hyperthyroidism     s/p ablation maintained on synthroid  . GERD (gastroesophageal reflux disease)   . Glaucoma   . Hypercholesterolemia   . Cancer     breast cancer   . Crohn's disease    Past Surgical History  Procedure Laterality Date  . Tonsillectomy and adenoidectomy  1957  . Right oophorectomy  1979  . Colonoscopy  2010    Dr Vira Agar  . Breast enhancement surgery  [place]  . Portacath placement  11/07/2012   Family History  Problem Relation Age of Onset  . Diabetes Mother   . Heart disease  Mother     myocardial infarction  . Glaucoma Mother   . Graves' disease Mother   . Lung disease Father   . Heart disease      half sister  . Peripheral vascular disease      half sister  . Heart attack Mother 81   History  Substance Use Topics  . Smoking status: Current Every Day Smoker -- 1.00 packs/day for 40 years    Types: Cigarettes  . Smokeless tobacco: Never Used  . Alcohol Use: No   OB History    Gravida Para Term Preterm AB TAB SAB Ectopic Multiple Living   1         1      Obstetric Comments   1st Menstrual Cycle:  13 1st Pregnancy:  22     Review of Systems  Cardiovascular: Positive for chest pain.  All other systems reviewed and are negative.     Allergies  Clindamycin/lincomycin; Codeine; Shellfish allergy; and Valium  Home Medications   Prior to Admission medications   Medication Sig Start Date End Date Taking? Authorizing Provider  ADVAIR DISKUS 250-50 MCG/DOSE AEPB INHALE ONE DOSE BY MOUTH TWICE DAILY, RINSE MOUTH AFTER USE 08/04/14   Einar Pheasant, MD  cholecalciferol (VITAMIN D) 1000 UNITS tablet Take 2,000 Units by mouth daily.    Historical Provider, MD  Cyanocobalamin (VITAMIN B 12 PO) Take by mouth daily.  Historical Provider, MD  levothyroxine (SYNTHROID, LEVOTHROID) 125 MCG tablet Take 1 tablet (125 mcg total) by mouth daily. 08/28/14   Einar Pheasant, MD  Magnesium Oxide 400 MG CAPS Take 400 mg by mouth daily.    Historical Provider, MD  omeprazole (PRILOSEC OTC) 20 MG tablet Take 20 mg by mouth daily.    Historical Provider, MD  travoprost, benzalkonium, (TRAVATAN) 0.004 % ophthalmic solution 1 drop at bedtime.    Historical Provider, MD  VENTOLIN HFA 108 (90 BASE) MCG/ACT inhaler INHALE TWO PUFFS BY MOUTH EVERY 6 HOURS AS NEEDED FOR WHEEZING FOR SHORTNESS OF BREATH 05/29/14   Einar Pheasant, MD   SpO2 100%  LMP 06/15/1977 Physical Exam  Nursing note and vitals reviewed.  69 year old female, resting comfortably and in no acute distress.  Vital signs are normal. Oxygen saturation is 100%, which is normal. Head is normocephalic and atraumatic. PERRLA, EOMI. Oropharynx is clear. Neck is nontender and supple without adenopathy or JVD. Back is nontender and there is no CVA tenderness. Lungs are clear without rales, wheezes, or rhonchi. Chest is nontender. Right mastectomy scars present. Mediport present in the left parasternal area. Heart has regular rate and rhythm without murmur. Abdomen is soft, flat, nontender without masses or hepatosplenomegaly and peristalsis is normoactive. Extremities have no cyanosis or edema, full range of motion is present. Skin is warm and dry without rash. Neurologic: Mental status is normal, cranial nerves are intact, there are no motor or sensory deficits.  ED Course  Procedures (including critical care time)   EKG Interpretation   Date/Time:  Monday September 01 2014 22:35:04 EDT Ventricular Rate:  87 PR Interval:  243 QRS Duration: 94 QT Interval:  354 QTC Calculation: 426 R Axis:   90 Text Interpretation:  Sinus rhythm Prolonged PR interval Inferoposterior  infarct, acute (RCA) Probable RV involvement, suggest recording right  precordial leads No old tracing to compare Confirmed by [facility]  MD, Janin Kozlowski  ([id number]) on 09/01/2014 10:42:40 PM      CRITICAL CARE Performed by: SLPNP,YYFRT Total critical care time: 35 minutes Critical care time was exclusive of separately billable procedures and treating other patients. Critical care was necessary to treat or prevent imminent or life-threatening deterioration. Critical care was time spent personally by me on the following activities: development of treatment plan with patient and/or surrogate as well as nursing, discussions with consultants, evaluation of patient's response to treatment, examination of patient, obtaining history from patient or surrogate, ordering and performing treatments and interventions, ordering and review of laboratory studies,  ordering and review of radiographic studies, pulse oximetry and re-evaluation of patient's condition.  MDM   Final diagnoses:  ST elevation myocardial infarction (STEMI), unspecified artery    STEMI, but now pain-free. She arrived via ambulance as a code STEMI. She is given initial dose of heparin in the ED and has been taken directly to the cardiac catheterization lab. She was taken to catheterization lab before lab tests could be ordered. Old records are reviewed and she has no prior inpatient records in the [facility] system, but has been followed in an outpatient clinic with the clinic records in the hospital record system. Significant past history of breast cancer and hyperlipidemia are confirmed.    Delora Fuel, MD 06/27/15 [id number]

## 2014-09-01 NOTE — ED Notes (Signed)
The patient called EMS and was complaining of mid sternum chest pain radiating down both arms.  THe patient denies any other symptoms.  She did tell EMS it was also radiating to her jaw under her chin.  EMS placed an 18 gauge and gave her nasal nitro and 500cc bolus of NS.  The patient did take six aspirins in the course of six hours. EMS transported her here.  Patient was pain free upon arrival but now the pain has returned.

## 2014-09-01 NOTE — ED Notes (Signed)
MD at bedside. 

## 2014-09-02 ENCOUNTER — Encounter (HOSPITAL_COMMUNITY): Payer: Self-pay | Admitting: Cardiology

## 2014-09-02 DIAGNOSIS — I2510 Atherosclerotic heart disease of native coronary artery without angina pectoris: Secondary | ICD-10-CM

## 2014-09-02 DIAGNOSIS — I2119 ST elevation (STEMI) myocardial infarction involving other coronary artery of inferior wall: Secondary | ICD-10-CM | POA: Diagnosis present

## 2014-09-02 DIAGNOSIS — Z720 Tobacco use: Secondary | ICD-10-CM | POA: Diagnosis present

## 2014-09-02 DIAGNOSIS — I213 ST elevation (STEMI) myocardial infarction of unspecified site: Secondary | ICD-10-CM

## 2014-09-02 DIAGNOSIS — R079 Chest pain, unspecified: Secondary | ICD-10-CM

## 2014-09-02 DIAGNOSIS — I2111 ST elevation (STEMI) myocardial infarction involving right coronary artery: Secondary | ICD-10-CM

## 2014-09-02 LAB — CBC
HEMATOCRIT: 35.9 % — AB (ref 36.0–46.0)
HEMATOCRIT: 34.2 % — AB (ref 36.0–46.0)
Hemoglobin: 12.1 g/dL (ref 12.0–15.0)
Hemoglobin: 11.7 g/dL — ABNORMAL LOW (ref 12.0–15.0)
MCH: 28.4 pg (ref 26.0–34.0)
MCH: 28.7 pg (ref 26.0–34.0)
MCHC: 33.7 g/dL (ref 30.0–36.0)
MCHC: 34.2 g/dL (ref 30.0–36.0)
MCV: 84.3 fL (ref 78.0–100.0)
MCV: 83.8 fL (ref 78.0–100.0)
PLATELETS: 162 10*3/uL (ref 150–400)
Platelets: 167 10*3/uL (ref 150–400)
RBC: 4.26 MIL/uL (ref 3.87–5.11)
RBC: 4.08 MIL/uL (ref 3.87–5.11)
RDW: 13 % (ref 11.5–15.5)
RDW: 13.2 % (ref 11.5–15.5)
WBC: 7.7 10*3/uL (ref 4.0–10.5)
WBC: 8.4 10*3/uL (ref 4.0–10.5)

## 2014-09-02 LAB — POCT I-STAT, CHEM 8
BUN: 9 mg/dL (ref 6–23)
CREATININE: 0.6 mg/dL (ref 0.50–1.10)
Calcium, Ion: 1.1 mmol/L — ABNORMAL LOW (ref 1.13–1.30)
Chloride: 92 mmol/L — ABNORMAL LOW (ref 96–112)
Glucose, Bld: 121 mg/dL — ABNORMAL HIGH (ref 70–99)
HCT: 31 % — ABNORMAL LOW (ref 36.0–46.0)
Hemoglobin: 10.5 g/dL — ABNORMAL LOW (ref 12.0–15.0)
POTASSIUM: 3.1 mmol/L — AB (ref 3.5–5.1)
Sodium: 130 mmol/L — ABNORMAL LOW (ref 135–145)
TCO2: 18 mmol/L (ref 0–100)

## 2014-09-02 LAB — BASIC METABOLIC PANEL
ANION GAP: 8 (ref 5–15)
Anion gap: 10 (ref 5–15)
BUN: 9 mg/dL (ref 6–23)
BUN: 7 mg/dL (ref 6–23)
CALCIUM: 8.9 mg/dL (ref 8.4–10.5)
CALCIUM: 9.1 mg/dL (ref 8.4–10.5)
CHLORIDE: 105 mmol/L (ref 96–112)
CHLORIDE: 104 mmol/L (ref 96–112)
CO2: 23 mmol/L (ref 19–32)
CO2: 23 mmol/L (ref 19–32)
CREATININE: 0.82 mg/dL (ref 0.50–1.10)
Creatinine, Ser: 0.65 mg/dL (ref 0.50–1.10)
GFR calc Af Amer: 85 mL/min — ABNORMAL LOW (ref >90)
GFR calc Af Amer: >90 mL/min (ref >90)
GFR calc non Af Amer: 73 mL/min — ABNORMAL LOW (ref >90)
GFR calc non Af Amer: >90 mL/min (ref >90)
GLUCOSE: 93 mg/dL (ref 70–99)
Glucose, Bld: 156 mg/dL — ABNORMAL HIGH (ref 70–99)
Potassium: 4.2 mmol/L (ref 3.5–5.1)
Potassium: 4.4 mmol/L (ref 3.5–5.1)
Sodium: 136 mmol/L (ref 135–145)
Sodium: 137 mmol/L (ref 135–145)

## 2014-09-02 LAB — LIPID PANEL
CHOL/HDL RATIO: 6.7 ratio
Cholesterol: 260 mg/dL — ABNORMAL HIGH (ref 0–200)
HDL: 39 mg/dL — AB (ref >39)
LDL Cholesterol: 189 mg/dL — ABNORMAL HIGH (ref 0–99)
Triglycerides: 162 mg/dL — ABNORMAL HIGH (ref <150)
VLDL: 32 mg/dL (ref 0–40)

## 2014-09-02 LAB — TROPONIN I
TROPONIN I: 2.67 ng/mL — AB (ref <0.031)
TROPONIN I: 14.15 ng/mL — AB (ref <0.031)
Troponin I: 11.75 ng/mL (ref <0.031)

## 2014-09-02 LAB — POCT ACTIVATED CLOTTING TIME: ACTIVATED CLOTTING TIME: 497 s

## 2014-09-02 LAB — MAGNESIUM: MAGNESIUM: 2 mg/dL (ref 1.5–2.5)

## 2014-09-02 LAB — MRSA PCR SCREENING: MRSA BY PCR: NEGATIVE

## 2014-09-02 MED ORDER — MOMETASONE FURO-FORMOTEROL FUM 100-5 MCG/ACT IN AERO
2.0000 | INHALATION_SPRAY | Freq: Two times a day (BID) | RESPIRATORY_TRACT | Status: DC
  Administered 2014-09-02 – 2014-09-04 (×5): 2 via RESPIRATORY_TRACT
  Filled 2014-09-02: qty 8.8

## 2014-09-02 MED ORDER — LEVOTHYROXINE SODIUM 125 MCG PO TABS
125.0000 ug | ORAL_TABLET | Freq: Every day | ORAL | Status: DC
  Administered 2014-09-02 – 2014-09-04 (×3): 125 ug via ORAL
  Filled 2014-09-02 (×4): qty 1

## 2014-09-02 MED ORDER — PERFLUTREN LIPID MICROSPHERE
1.0000 mL | INTRAVENOUS | Status: AC | PRN
  Administered 2014-09-02: 2 mL via INTRAVENOUS
  Filled 2014-09-02: qty 10

## 2014-09-02 MED ORDER — ACETAMINOPHEN 325 MG PO TABS: 650.0000 mg | ORAL_TABLET | ORAL | Status: DC | PRN

## 2014-09-02 MED ORDER — SODIUM CHLORIDE 0.9 % IV SOLN
INTRAVENOUS | Status: AC
  Administered 2014-09-02: 02:00:00 via INTRAVENOUS

## 2014-09-02 MED ORDER — HEPARIN SODIUM (PORCINE) 5000 UNIT/ML IJ SOLN
5000.0000 [IU] | Freq: Three times a day (TID) | INTRAMUSCULAR | Status: DC
  Administered 2014-09-02 – 2014-09-04 (×6): 5000 [IU] via SUBCUTANEOUS
  Filled 2014-09-02 (×9): qty 1

## 2014-09-02 MED ORDER — BIVALIRUDIN 250 MG IV SOLR
INTRAVENOUS | Status: AC
  Filled 2014-09-02: qty 250

## 2014-09-02 MED ORDER — MORPHINE SULFATE 2 MG/ML IJ SOLN: 1.0000 mg | INTRAMUSCULAR | Status: DC | PRN

## 2014-09-02 MED ORDER — TICAGRELOR 90 MG PO TABS
ORAL_TABLET | ORAL | Status: AC
  Filled 2014-09-02: qty 1

## 2014-09-02 MED ORDER — ATORVASTATIN CALCIUM 80 MG PO TABS
80.0000 mg | ORAL_TABLET | Freq: Every day | ORAL | Status: DC
  Filled 2014-09-02 (×2): qty 1

## 2014-09-02 MED ORDER — TICAGRELOR 90 MG PO TABS
90.0000 mg | ORAL_TABLET | Freq: Two times a day (BID) | ORAL | Status: DC
  Administered 2014-09-02 – 2014-09-04 (×5): 90 mg via ORAL
  Filled 2014-09-02 (×6): qty 1

## 2014-09-02 MED ORDER — TRAVOPROST (BAK FREE) 0.004 % OP SOLN
1.0000 [drp] | Freq: Every day | OPHTHALMIC | Status: DC
  Administered 2014-09-02 – 2014-09-03 (×2): 1 [drp] via OPHTHALMIC
  Filled 2014-09-02: qty 2.5

## 2014-09-02 MED ORDER — MAGNESIUM OXIDE 400 (241.3 MG) MG PO TABS
400.0000 mg | ORAL_TABLET | Freq: Every day | ORAL | Status: DC
  Administered 2014-09-02 – 2014-09-04 (×3): 400 mg via ORAL
  Filled 2014-09-02 (×3): qty 1

## 2014-09-02 MED ORDER — PERFLUTREN LIPID MICROSPHERE
INTRAVENOUS | Status: AC
  Filled 2014-09-02: qty 10

## 2014-09-02 MED ORDER — ONDANSETRON HCL 4 MG/2ML IJ SOLN
4.0000 mg | Freq: Four times a day (QID) | INTRAMUSCULAR | Status: DC | PRN
  Administered 2014-09-02: 4 mg via INTRAVENOUS
  Filled 2014-09-02: qty 2

## 2014-09-02 MED ORDER — ASPIRIN 81 MG PO CHEW
81.0000 mg | CHEWABLE_TABLET | Freq: Every day | ORAL | Status: DC
  Administered 2014-09-02 – 2014-09-04 (×3): 81 mg via ORAL
  Filled 2014-09-02 (×3): qty 1

## 2014-09-02 MED ORDER — ATROPINE SULFATE 0.1 MG/ML IJ SOLN
INTRAMUSCULAR | Status: AC
  Filled 2014-09-02: qty 10

## 2014-09-02 MED FILL — Sodium Chloride IV Soln 0.9%: INTRAVENOUS | Qty: 50 | Status: AC

## 2014-09-02 NOTE — Progress Notes (Addendum)
CARDIAC REHAB PHASE I   PRE:  Rate/Rhythm: 76 SR    BP: sitting 83/65    SaO2:   MODE:  Ambulation: 250 ft   POST:  Rate/Rhythm: 106 ST    BP: sitting 117/63     SaO2:   Pt slightly dizzy by end of walk. Sts she feels sleepy/tired in general. BP low before walk, better after. Began education. Pt slightly resistant to education in general and did not want to discuss smoking cessation at all. Sts she knows everything about quitting and will quit when shes ready. Reviewed Brilinta importance. Pt is interested in CRPII and will send referral to [facility]. [phone]   Haley Santos [place] CES, ACSM 09/02/2014 2:11 PM

## 2014-09-02 NOTE — Care Management Note (Addendum)
    Page 1 of 1   09/04/2014     10:19:50 AM CARE MANAGEMENT NOTE 09/04/2014  Patient:  Haley Santos, Haley Santos   Account Number:  [id number]  Date Initiated:  09/02/2014  Documentation initiated by:  Elissa Hefty  Subjective/Objective Assessment:   adm w stemi     Action/Plan:   lives alone, pcp dr Randell Patient scott   Anticipated DC Date:  09/04/2014   Anticipated DC Plan:  [place]  CM consult  Medication Assistance      Choice offered to / List presented to:             Status of service:   Medicare Important Message given?  YES (If response is "NO", the following Medicare IM given date fields will be blank) Date Medicare IM given:  09/04/2014 Medicare IM given by:  Elissa Hefty Date Additional Medicare IM given:   Additional Medicare IM given by:    Discharge Disposition:    Per UR Regulation:  Reviewed for med. necessity/level of care/duration of stay  If discussed at [place] of Stay Meetings, dates discussed:    Comments:  4/21  1018 debbie Ronaldo Crilly rn,bsn gave pt brilinta pt assist form and she will mail in w proof of income. she has 30day free card for brilinta. for dc home today.  4/20  1028a debbie Latrisa Hellums rn,bsn gave pt inform on copay. she states w inhalers cannot afford. have placed pt assist form on shadow chart for md to sign and pt aware she will need signed form and proof of income them mail in to see if she can get thru drug company. looked up assist for proair and advair and found organization that may be able to assist w these and gave pt inform and phone number to call.  4/19 0950 debbie Eli Adami rn,bsn spoke w pt. she has [employer] for medicare d. not sure if copay very high can afford due to taking inhalers. will try and find copay amt. gave pt 30day free brilinta card. copay is 77.29 w no prior auth req

## 2014-09-02 NOTE — H&P (Cosign Needed)
Patient ID: Haley Santos MRN: [id number], DOB/AGE: 1945-07-03   Admit date: 09/01/2014   Primary Physician: Einar Pheasant, MD Primary Cardiologist: None  CC: STEMI  Problem List  Past Medical History  Diagnosis Date  . Asthma with bronchitis   . Multiple sclerosis 1993  . Hyperthyroidism     s/p ablation maintained on synthroid  . GERD (gastroesophageal reflux disease)   . Glaucoma   . Hypercholesterolemia   . Cancer     breast cancer   . Crohn's disease     Past Surgical History  Procedure Laterality Date  . Tonsillectomy and adenoidectomy  1957  . Right oophorectomy  1979  . Colonoscopy  2010    Dr Vira Agar  . Breast enhancement surgery  [place]  . Portacath placement  11/07/2012  . Left heart catheterization with coronary angiogram N/A 09/01/2014    Procedure: LEFT HEART CATHETERIZATION WITH CORONARY ANGIOGRAM;  Surgeon: Peter M Martinique, MD;  Location[facility] C[facility];  Service: Cardiovascular;  Laterality: N/A;     Allergies  Allergies  Allergen Reactions  . Clindamycin/Lincomycin   . Codeine   . Shellfish Allergy Nausea And Vomiting    Felt goofy headed  . Valium [Diazepam]     HPI The patient is a 77F with no prior cardiac history but multiple cardiovascular risk factors including HLD, current smoker, FHx who presents with STEMI. She was working earlier as a Oceanographer and developed some mild chest discomfort that progressively worsened. Pain was increasingly severe and associated with diaphoresis. Pain was 8/10 at its worse.  Given persistent symptoms she called EMS. She was given NTG that alleviated her symptoms.  En route EKG revealed inferior STE. The cath lab was activated and she had a significant mid-RCA lesion that was stented with a long DES. She was pain-free post-procedure. She was given ASA en route, heparin in the ED, and ticagrelor for P2Y12 inhibition.  Home Medications  Prior to Admission medications   Medication  Sig Start Date End Date Taking? Authorizing Provider  ADVAIR DISKUS 250-50 MCG/DOSE AEPB INHALE ONE DOSE BY MOUTH TWICE DAILY, RINSE MOUTH AFTER USE 08/04/14   Einar Pheasant, MD  cholecalciferol (VITAMIN D) 1000 UNITS tablet Take 2,000 Units by mouth daily.    Historical Provider, MD  Cyanocobalamin (VITAMIN B 12 PO) Take by mouth daily.    Historical Provider, MD  levothyroxine (SYNTHROID, LEVOTHROID) 125 MCG tablet Take 1 tablet (125 mcg total) by mouth daily. 08/28/14   Einar Pheasant, MD  Magnesium Oxide 400 MG CAPS Take 400 mg by mouth daily.    Historical Provider, MD  omeprazole (PRILOSEC OTC) 20 MG tablet Take 20 mg by mouth daily.    Historical Provider, MD  travoprost, benzalkonium, (TRAVATAN) 0.004 % ophthalmic solution 1 drop at bedtime.    Historical Provider, MD  VENTOLIN HFA 108 (90 BASE) MCG/ACT inhaler INHALE TWO PUFFS BY MOUTH EVERY 6 HOURS AS NEEDED FOR WHEEZING FOR SHORTNESS OF BREATH 05/29/14   Einar Pheasant, MD    Family History  Family History  Problem Relation Age of Onset  . Diabetes Mother   . Heart disease Mother     myocardial infarction  . Glaucoma Mother   . Graves' disease Mother   . Lung disease Father   . Heart disease      half sister  . Peripheral vascular disease      half sister  . Heart attack Mother 71    Social  History  History   Social History  . Marital Status: Divorced    Spouse Name: N/A  . Number of Children: 1  . Years of Education: N/A   Occupational History  . Not on file.   Social History Main Topics  . Smoking status: Current Every Day Smoker -- 1.00 packs/day for 40 years    Types: Cigarettes  . Smokeless tobacco: Never Used  . Alcohol Use: No  . Drug Use: No  . Sexual Activity: Not on file   Other Topics Concern  . Not on file   Social History Narrative     Review of Systems General:  No chills, fever, night sweats or weight changes.  Cardiovascular:  +chest pain, no dyspnea on exertion, edema, orthopnea,  palpitations, paroxysmal nocturnal dyspnea. Dermatological: No rash, lesions/masses Respiratory: No cough, dyspnea Urologic: No hematuria, dysuria Abdominal:   No nausea, vomiting, diarrhea, bright red blood per rectum, melena, or hematemesis Neurologic:  No visual changes, wkns, changes in mental status. All other systems reviewed and are otherwise negative except as noted above.  Physical Exam  Blood pressure 146/72, pulse 59, temperature 97.2 F (36.2 C), temperature source Oral, resp. rate 16, height 5\' 6"  (1.676 m), weight 211 lb (95.709 kg), last menstrual period 06/15/1977, SpO2 100 %.  General: Pleasant, NAD Psych: Normal affect. Neuro: Alert and oriented X 3. Moves all extremities spontaneously. HEENT: Normal  Neck: Supple without bruits or JVD. Lungs:  Resp regular and unlabored, CTA. Heart: RRR no s3, s4, or murmurs. Abdomen: Soft, non-tender, non-distended, BS + x 4.  Extremities: No clubbing, cyanosis or edema. DP/PT/Radials 2+ and equal bilaterally.  Labs  Troponin (Point of Care Test) No results for input(s): TROPIPOC in the last 72 hours. No results for input(s): CKTOTAL, CKMB, TROPONINI in the last 72 hours. Lab Results  Component Value Date   WBC 7.7 09/02/2014   HGB 12.1 09/02/2014   HCT 35.9* 09/02/2014   MCV 84.3 09/02/2014   PLT 162 09/02/2014   No results for input(s): NA, K, CL, CO2, BUN, CREATININE, CALCIUM, PROT, BILITOT, ALKPHOS, ALT, AST, GLUCOSE in the last 168 hours.  Invalid input(s): LABALBU Lab Results  Component Value Date   CHOL 248* 09/12/2012   HDL 33.50* 09/12/2012   TRIG 156.0* 09/12/2012   No results found for: DDIMER   Radiology/Studies  No results found.  ECG Inferior ST elevation with reciprocal changes  ASSESSMENT AND PLAN The patient is a 55F with no prior cardiac history but multiple cardiovascular risk factors including HLD, current smoker, FHx who presents with STEMI now s/p primary PCI with excellent angiographic  result  #STEMI: Inferior -continue ASA 81mg , ticagrelor 90mg  bid -start low-dose BB, ACEi -defer statin for now. She reports severe muscle pain. Will start ezetimibe for now. If she has Salome she may be a candidate for PCSK9 inhibitor. -TTE in AM -strongly counseled tobacco cessation -check lipids, A1c  #Hypothyroidism -continue synthroid  Signed, Raliegh Ip, MD MPH 09/02/2014, 2:53 AM

## 2014-09-02 NOTE — Progress Notes (Signed)
  Echocardiogram 2D Echocardiogram with Definity has been performed.  Diamond Nickel 09/02/2014, 3:40 PM

## 2014-09-02 NOTE — Progress Notes (Signed)
eLink Physician-Brief Progress Note Patient Name: Haley Santos DOB: [birth year] MRN: [id number]   Date of Service  09/02/2014  HPI/Events of Note  81 F with PMH of HLD and tobacco abuse presenting in transfer from [facility] with STEMI.  Now s/p stent to RCA.  Patient is alert, HD stable.  eICU Interventions  Plan of care per cardiology service Continue to monitor via [facility]     Intervention Category Evaluation Type: New Patient Evaluation  DETERDING,ELIZABETH 09/02/2014, 1:33 AM

## 2014-09-02 NOTE — Progress Notes (Signed)
Right femoral arterial sheath was pulled out at 0412 after patient was educated. Procedure was tolerated well by patient. Will continue to monitor

## 2014-09-02 NOTE — CV Procedure (Signed)
Cardiac Catheterization Procedure Note  Name: Haley Santos MRN: [id number] DOB: 22-Aug-1945  Procedure: Left Heart Cath, Selective Coronary Angiography, LV angiography,  PTCA/Stent of the RCA  Indication: 69 yo WF with history of hyperlipidemia and tobacco abuse is transferred from [facility] hospital with an acute inferior STEMI. Time to balloon delayed due to simultaneous cath lab activation and need for an additional team.    Diagnostic Procedure Details: Right radial access was attempted. Although able to get arterial flow with needle unable to pass wire due to radial artery spasm and hypotension. The right groin was prepped, draped, and anesthetized with 1% lidocaine. Using the modified Seldinger technique, a 6 French sheath was introduced into the right femoral artery. Standard Judkins catheters were used for selective coronary angiography and left ventriculography. Catheter exchanges were performed over a wire.  The diagnostic procedure was well-tolerated without immediate complications.  PROCEDURAL FINDINGS Hemodynamics: AO 117/60 mean 85 mm Hg LV 115/13 mm Hg  Coronary angiography: Coronary dominance: right  Left mainstem: 20% ostial stenosis.   Left anterior descending (LAD): The LAD is moderately calcified in the proximal vessel. There is a focal 50-70% stenosis in the mid vessel. The first diagonal is without significant disease.   Left circumflex (LCx): The LCx consists of a single large OM branch. There is diffuse disease in the proximal to mid OM to 40%.   Right coronary artery (RCA): The RCA is a large dominant vessel. There is a 99% stenosis in the mid vessel just prior to the bifurcation of an RV marginal branch.   Left ventriculography: Left ventricular systolic function is abnormal. There is basal to mid inferior hypokinesis.  LVEF is estimated at 45-50%, there is no significant mitral regurgitation   PCI Procedure Note:  Following the diagnostic procedure, the  decision was made to proceed with PCI of the RCA.  Weight-based bivalirudin was given for anticoagulation. Once a therapeutic ACT was achieved, a 6 [place] FR4 guide catheter was inserted.  A prowater coronary guidewire was used to cross the lesion into the RV marginal branch. The RCA proper was difficult to cross due to angulation but I was able to finally cross with a PT 2 light support wire.   The lesion was predilated with a 2.5 mm balloon.  The lesion was then stented with a 3.0 x 38 mm Promus stentn dilated to 14 atm.  At this point the RV marginal branch was occluded. The prowater was still down the RV branch. Using this as a landmark I was unable to recross with the PT 2 wire, Miracle 3 wire, or a Whisper wire. Using a Supercross catheter with 90 degree tip I was finally able to pass the PT 2 wire into the RV branch. The prowater wire was withdrawn and redirected down the RCA. I attempted to dilate the RV branch but even with aggressive guide support and a 1.2 mm balloon I was unable to cross with a balloon. The wire was withdrawn and with IC Ntg there was restoration of flow into the RV branch TIMI2 with severe ostial stenosis. Her pain resolved.   Following PCI, there was 0% residual stenosis and TIMI-3 flow in the RCA. Final angiography confirmed an excellent result. Femoral hemostasis will be achieved with manual compression.  The patient tolerated the PCI procedure well. There were no immediate procedural complications.  The patient was transferred to the post catheterization recovery area for further monitoring.  PCI Data: Vessel - RCA/Segment - mid Percent Stenosis (  pre)  99% TIMI-flow 2 Stent 3.0 x 38 mm Promus Percent Stenosis (post) 0% TIMI-flow (post) 3 Compromise of RV marginal side branch but flow restored at end of procedure.   Final Conclusions:   1. 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 50-70% mid LAD 2. Mild LV dysfunction. 3. Successful stenting of the RCA with  DES.  Recommendations: DAPT for one year. Aggressive risk factor modification.  Haley Santos, [place]  09/02/2014, 12:48 AM

## 2014-09-03 DIAGNOSIS — I213 ST elevation (STEMI) myocardial infarction of unspecified site: Secondary | ICD-10-CM

## 2014-09-03 DIAGNOSIS — E038 Other specified hypothyroidism: Secondary | ICD-10-CM

## 2014-09-03 DIAGNOSIS — Z720 Tobacco use: Secondary | ICD-10-CM

## 2014-09-03 DIAGNOSIS — I2119 ST elevation (STEMI) myocardial infarction involving other coronary artery of inferior wall: Principal | ICD-10-CM

## 2014-09-03 DIAGNOSIS — E780 Pure hypercholesterolemia: Secondary | ICD-10-CM

## 2014-09-03 DIAGNOSIS — I10 Essential (primary) hypertension: Secondary | ICD-10-CM

## 2014-09-03 LAB — BASIC METABOLIC PANEL
Anion gap: 11 (ref 5–15)
BUN: 12 mg/dL (ref 6–23)
CALCIUM: 9.1 mg/dL (ref 8.4–10.5)
CO2: 24 mmol/L (ref 19–32)
CREATININE: 0.9 mg/dL (ref 0.50–1.10)
Chloride: 102 mmol/L (ref 96–112)
GFR calc Af Amer: 76 mL/min — ABNORMAL LOW (ref >90)
GFR, EST NON AFRICAN AMERICAN: 65 mL/min — AB (ref >90)
GLUCOSE: 87 mg/dL (ref 70–99)
Potassium: 4 mmol/L (ref 3.5–5.1)
SODIUM: 137 mmol/L (ref 135–145)

## 2014-09-03 LAB — TROPONIN I
TROPONIN I: 7.77 ng/mL — AB (ref <0.031)
TROPONIN I: 7.13 ng/mL — AB (ref <0.031)
Troponin I: 9.53 ng/mL (ref <0.031)
Troponin I: 4.91 ng/mL (ref <0.031)

## 2014-09-03 LAB — HEMOGLOBIN A1C
Hgb A1c MFr Bld: 6.2 % — ABNORMAL HIGH (ref 4.8–5.6)
Mean Plasma Glucose: 131 mg/dL

## 2014-09-03 MED FILL — Sodium Chloride IV Soln 0.9%: INTRAVENOUS | Qty: 50 | Status: AC

## 2014-09-03 NOTE — Progress Notes (Signed)
CARDIAC REHAB PHASE I   PRE:  Rate/Rhythm: 86 SR    BP: sitting 120/39    SaO2:   MODE:  Ambulation: 350 ft   POST:  Rate/Rhythm: 110 ST    BP: sitting 114/49     SaO2:   Pt ambulated independently. Tired toward end of walk, some SOB. See vitals above. Discussed ex gl with pt. She sts "don't you think that's a extremely optimistic?" Encouraged her to do what she can. Reinforced Brilinta importance. No further questions. [place], [place], ACSM 09/03/2014 12:08 PM

## 2014-09-03 NOTE — Progress Notes (Signed)
SUBJECTIVE:  No complaints this am  OBJECTIVE:   Vitals:   Filed Vitals:   09/03/14 0400 09/03/14 0500 09/03/14 0800 09/03/14 0830  BP: 103/66  140/120 91/33  Pulse:   94   Temp: 98.6 F (37 C)  98.4 F (36.9 C)   TempSrc: Oral  Axillary   Resp:      Height:      Weight:  209 lb 14.1 oz (95.2 kg)    SpO2: 100%  98%    I&O's:   Intake/Output Summary (Last 24 hours) at 09/03/14 0950 Last data filed at 09/03/14 0800  Gross per 24 hour  Intake    630 ml  Output      0 ml  Net    630 ml   TELEMETRY: Reviewed telemetry pt in NSR with 5 beat run of WCT:     PHYSICAL EXAM General: Well developed, well nourished, in no acute distress Head: Eyes PERRLA, No xanthomas.   Normal cephalic and atramatic  Lungs:   Clear bilaterally to auscultation and percussion. Heart:   HRRR S1 S2 Pulses are 2+ & equal. Abdomen: Bowel sounds are positive, abdomen soft and non-tender without masses  Extremities:   No clubbing, cyanosis or edema.  DP +1 Neuro: Alert and oriented X 3. Psych:  Good affect, responds appropriately   LABS: Basic Metabolic Panel:  Recent Labs  09/02/14 0216 09/02/14 1225  NA 136 137  K 4.2 4.4  CL 105 104  CO2 23 23  GLUCOSE 156* 93  BUN 9 7  CREATININE 0.82 0.65  CALCIUM 8.9 9.1  MG  --  2.0   Liver Function Tests: No results for input(s): AST, ALT, ALKPHOS, BILITOT, PROT, ALBUMIN in the last 72 hours. No results for input(s): LIPASE, AMYLASE in the last 72 hours. CBC:  Recent Labs  09/02/14 0216 09/02/14 1225  WBC 7.7 8.4  HGB 12.1 11.7*  HCT 35.9* 34.2*  MCV 84.3 83.8  PLT 162 167   Cardiac Enzymes:  Recent Labs  09/02/14 2104 09/03/14 0112 09/03/14 0809  TROPONINI 11.75* 9.53* 7.77*   BNP: Invalid input(s): POCBNP D-Dimer: No results for input(s): DDIMER in the last 72 hours. Hemoglobin A1C:  Recent Labs  09/02/14 0414  HGBA1C 6.2*   Fasting Lipid Panel:  Recent Labs  09/02/14 0414  CHOL 260*  HDL 39*  LDLCALC 189*   TRIG 162*  CHOLHDL 6.7   Thyroid Function Tests: No results for input(s): TSH, T4TOTAL, T3FREE, THYROIDAB in the last 72 hours.  Invalid input(s): FREET3 Anemia Panel: No results for input(s): VITAMINB12, FOLATE, FERRITIN, TIBC, IRON, RETICCTPCT in the last 72 hours. Coag Panel:   No results found for: INR, PROTIME  RADIOLOGY: No results found.  ASSESSMENT AND PLAN The patient is a 20F with no prior cardiac history but multiple cardiovascular risk factors including HLD, current smoker, FHx who presents with STEMI now s/p primary PCI with excellent angiographic result  1.  STEMI: Inferior with cath showing 2 vessel ASCAD with 50-70% focal mid LAD stenosis, 40% mid OM, 99% mid RCA stenosis s/p PCI of mid RCA.  2D echo with normal LVF.  Troponin trending downward.   -continue ASA 68m, ticagrelor 967mbid  - low-dose BB, ACEi on hold for now due to very soft BP - she refused statin last PM due to statin intolerance in the past - she says that she has tried all statins and is intolerant to all of them.  She will need to be  set up to be seen in lipid clinic to see if she is a candidate for the [place] 9 drug 2.  Tobacco abuse -strongly counseled tobacco cessation 3.  Hypothyroidism - history of hyperthyroidism s/p RAI ablation -continue synthroid 4.  Dyslipidemia with markedly elevated LDL at 189.  She needs to be on high dose statin therapy but has apparently not tolerated statins in the past. she refused statin last PM due to statin intolerance in the past - she says that she has tried all statins and is intolerant to all of them.  She will need to be set up to be seen in lipid clinic to see if she is a candidate for the [facility] 9 drug  5.  GERD 6.  Breast CA 7.  Multiple sclerosis 8.  DVT prophylaxis with SQ Heparin per pharmacy 9.  Glucose intolerance with HbGA1C - needs to be followed up by PCP 10.  Wide complex tachycardia - 5 beats on monitor - continue to follow.  Soft BP precludes  starting BB at this time.  LVF is normal.    Stable for transfer to tele bed.  Possible D/C home in am.  She would like to follow with Dr. Rockey Situ in [place].     Sueanne Margarita, MD  09/03/2014  9:50 AM

## 2014-09-04 ENCOUNTER — Encounter (HOSPITAL_COMMUNITY): Payer: Self-pay | Admitting: Physician Assistant

## 2014-09-04 DIAGNOSIS — K5090 Crohn's disease, unspecified, without complications: Secondary | ICD-10-CM | POA: Diagnosis present

## 2014-09-04 DIAGNOSIS — K219 Gastro-esophageal reflux disease without esophagitis: Secondary | ICD-10-CM | POA: Diagnosis present

## 2014-09-04 DIAGNOSIS — R7302 Impaired glucose tolerance (oral): Secondary | ICD-10-CM | POA: Diagnosis present

## 2014-09-04 LAB — TROPONIN I: TROPONIN I: 3.13 ng/mL — AB (ref <0.031)

## 2014-09-04 LAB — BASIC METABOLIC PANEL
ANION GAP: 14 (ref 5–15)
BUN: 13 mg/dL (ref 6–23)
CALCIUM: 9 mg/dL (ref 8.4–10.5)
CHLORIDE: 106 mmol/L (ref 96–112)
CO2: 20 mmol/L (ref 19–32)
Creatinine, Ser: 0.9 mg/dL (ref 0.50–1.10)
GFR calc non Af Amer: 65 mL/min — ABNORMAL LOW (ref >90)
GFR, EST AFRICAN AMERICAN: 76 mL/min — AB (ref >90)
GLUCOSE: 91 mg/dL (ref 70–99)
Potassium: 4.6 mmol/L (ref 3.5–5.1)
Sodium: 140 mmol/L (ref 135–145)

## 2014-09-04 MED ORDER — METOPROLOL TARTRATE 25 MG PO TABS: 12.5000 mg | ORAL_TABLET | Freq: Two times a day (BID) | ORAL | Status: DC

## 2014-09-04 MED ORDER — TICAGRELOR 90 MG PO TABS: 90.0000 mg | ORAL_TABLET | Freq: Two times a day (BID) | ORAL | Status: DC

## 2014-09-04 MED ORDER — ASPIRIN 81 MG PO CHEW: 81.0000 mg | CHEWABLE_TABLET | Freq: Every day | ORAL | Status: DC

## 2014-09-04 MED ORDER — METOPROLOL TARTRATE 12.5 MG HALF TABLET
12.5000 mg | ORAL_TABLET | Freq: Two times a day (BID) | ORAL | Status: DC
  Administered 2014-09-04: 12.5 mg via ORAL
  Filled 2014-09-04 (×2): qty 1

## 2014-09-04 NOTE — Discharge Summary (Signed)
Discharge Summary   Patient ID: Haley Santos MRN: [id number], DOB/AGE: 1946-04-15 69 y.o. Admit date: 09/01/2014 D/C date:     09/04/2014  Primary Cardiologist: She has seen [facility] in the past but requests Dr. Rockey Situ  Principal Problem:   ST elevation myocardial infarction (STEMI) of inferior wall Active Problems:   Hypertension   Hypercholesterolemia   Multiple sclerosis   Hypothyroidism   Breast cancer of upper-outer quadrant of right female breast   Tobacco abuse   GERD (gastroesophageal reflux disease)   Crohn's disease   Glucose intolerance (impaired glucose tolerance)    Admission Dates: 09/01/14-09/04/14 Discharge Diagnosis: inferior STEMI s/p DES to RCA  HPI: Haley Santos is a 69 y.o. 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 Pain was increasingly severe and associated with diaphoresis. Pain was 8/10 at its worse. Given persistent symptoms she called EMS. She was given NTG that alleviated her symptoms. En route EKG revealed inferior STE. The cath lab was activated   Hospital Course  1. STEMI: Inferior with cath showing 2 vessel ASCAD with 50-70% focal mid LAD stenosis, 40% mid OM, 99% mid RCA stenosis s/p PCI of mid RCA. 2D echo with normal LVF. Troponin trending downward.  - continue ASA 81mg , ticagrelor 90mg  bid, Lopressor 12.5mg  BID - no ACEi for now due to soft BP - hopefully can start as an outpt - she refused statin due to statin intolerance in the past - she says that she has tried all statins and is intolerant to all of them. She will need to be set up to be seen in lipid clinic to see if she is a candidate for the [place] 9 drug  2. Tobacco  abuse -strongly counseled tobacco cessation  3. Hypothyroidism - history of hyperthyroidism s/p RAI ablation -continue synthroid  4. Dyslipidemia with markedly elevated LDL at 189. She needs to be on high dose statin therapy but has apparently not tolerated statins in the past. she refused statin last PM due to statin intolerance in the past - she says that she has tried all statins and is intolerant to all of them. She will need to be set up to be seen in lipid clinic to see if she is a candidate for the [facility] 9 drug  5. GERD  6. Breast CA  7.  Multiple sclerosis  8. Glucose intolerance with HbGA1C 6.2 - needs to be followed up by PCP  9. Wide complex tachycardia - 5 beats on monitor - continue to follow. BP better today so low dose BB added. LVF normal.    The patient has had an uncomplicated hospital course and is recovering well. The femoral catheter site is stable. She has been seen by Dr. Radford Pax today and deemed ready for discharge home. All follow-up appointments have been scheduled. Smoking cessation was disscussed in length. A written RX for a 30 day free supply of Brilinta was provided for the patient. Discharge medications are listed below.   Discharge Vitals: Blood pressure 116/52, pulse 76, temperature 98.6 F (37 C), temperature source Oral, resp. rate 16, height 5\' 6"  (1.676 m), weight 209 lb 14.1 oz (95.2 kg), last menstrual period 06/15/1977, SpO2 99 %.  Labs: Lab Results  Component Value Date   WBC 8.4 09/02/2014   HGB 11.7* 09/02/2014   HCT 34.2* 09/02/2014   MCV 83.8 09/02/2014   PLT 167 09/02/2014     Recent Labs Lab 09/04/14 0450  NA 140  K 4.6  CL 106  CO2 20  BUN 13  CREATININE 0.90  CALCIUM 9.0  GLUCOSE 91    Recent Labs  09/03/14 0809 09/03/14 1300 09/03/14 1900 09/04/14 0450  TROPONINI 7.77* 7.13* 4.91* 3.13*   Lab Results  Component Value Date   CHOL 260* 09/02/2014   HDL 39* 09/02/2014   LDLCALC 189* 09/02/2014   TRIG  162* 09/02/2014     Diagnostic Studies/Procedures   Cardiac Catheterization Procedure Note  09/02/14 Left ventriculography: Left ventricular systolic function is abnormal. There is basal to mid inferior hypokinesis.  LVEF is estimated at 45-50%, there is no significant mitral regurgitation   PCI Data: Vessel - RCA/Segment - mid Percent Stenosis (pre)  99% TIMI-flow 2 Stent 3.0 x 38 mm Promus Percent Stenosis (post) 0% TIMI-flow (post) 3 Compromise of RV marginal side branch but flow restored at end of procedure.    Final Conclusions:   1. 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 50-70% mid LAD 2. Mild LV dysfunction. 3. Successful stenting of the RCA with DES. Recommendations: DAPT for one year. Aggressive risk factor modification.   2D ECHO Study Date: 09/02/2014 LV EF: 65% -   70% Study Conclusions - Left ventricle: The cavity size was normal. There was mild   concentric hypertrophy. Systolic function was vigorous. The   estimated ejection fraction was in the range of 65% to 70%. Wall   motion was normal; there were no regional wall motion   abnormalities. Doppler parameters are consistent with abnormal   left ventricular relaxation (grade 1 diastolic dysfunction).   There was no evidence of elevated ventricular filling pressure by   Doppler parameters. - Aortic valve: Trileaflet; normal thickness leaflets. There was no   regurgitation. - Aortic root: The aortic root was normal in size. - Mitral valve: Structurally normal valve. - Left atrium: The atrium was mildly dilated. - Right ventricle: Systolic function was normal. - Right atrium: The atrium was normal in size. - Tricuspid valve: There was no regurgitation. - Pulmonic valve: There was no regurgitation. - Pulmonary arteries: Systolic pressure was within the normal   range. - Inferior vena cava: The vessel was normal in size. - Pericardium, extracardiac: There was no pericardial effusion.   Discharge  Medications     Medication List    TAKE these medications        ADVAIR DISKUS 250-50 MCG/DOSE Aepb  Generic drug:  Fluticasone-Salmeterol  INHALE ONE DOSE BY MOUTH TWICE DAILY, RINSE MOUTH AFTER USE     aspirin 81 MG chewable tablet  Chew 1 tablet (81 mg total) by mouth daily.     cholecalciferol 1000 UNITS tablet  Commonly known as:  VITAMIN D  Take 2,000 Units by mouth daily.     latanoprost 0.005 % ophthalmic solution  Commonly known as:  XALATAN  Place 1-2 drops into both eyes daily.     levothyroxine 125 MCG tablet  Commonly known as:  SYNTHROID, LEVOTHROID  Take 1 tablet (125 mcg total) by mouth daily.     Magnesium Oxide 400 MG Caps  Take 400 mg by mouth daily.     metoprolol tartrate 25 MG tablet  Commonly known as:  LOPRESSOR  Take 0.5 tablets (12.5 mg total)  by mouth 2 (two) times daily.     omeprazole 20 MG tablet  Commonly known as:  PRILOSEC OTC  Take 20 mg by mouth daily.     ticagrelor 90 MG Tabs tablet  Commonly known as:  BRILINTA  Take 1 tablet (90 mg total) by mouth 2 (two) times daily.     VENTOLIN HFA 108 (90 BASE) MCG/ACT inhaler  Generic drug:  albuterol  INHALE TWO PUFFS BY MOUTH EVERY 6 HOURS AS NEEDED FOR WHEEZING FOR SHORTNESS OF BREATH     VITAMIN B 12 PO  Take by mouth daily.        Disposition   The patient will be discharged in stable condition to home. Discharge Instructions    Amb Referral to Cardiac Rehabilitation    Complete by:  As directed           Follow-up Information    Follow up with [facility] On 09/11/2014.   Why:  @ 10am for a consultation at the [facility] with Elberta Leatherwood Pharm-D    Contact information:   [place] [address] [phone]      Follow up with Ida Rogue, MD On 09/19/2014.   Specialty:  Cardiology   Why:  @ 2:30pm   Contact information:   [place]  [address] [phone]         Duration of Discharge Encounter: Greater than 30 minutes including physician and PA time.  Signed, [place]ville Silos, Kameah Rawl R PA-C 09/04/2014, 11:01 AM

## 2014-09-04 NOTE — Progress Notes (Signed)
Pt discharge instructions given. Pt verbalized understanding. Understands medications, activity restrictions, and diet. Also educated on when to call EMS or doctor. Belongings sent home with patient. Niece transporting patient home.

## 2014-09-04 NOTE — Progress Notes (Signed)
SUBJECTIVE:  No complaints - ready to go home  OBJECTIVE:   Vitals:   Filed Vitals:   09/03/14 1200 09/03/14 2055 09/04/14 0000 09/04/14 0346  BP: 120/39 108/49 124/55 116/52  Pulse:      Temp: 98.4 F (36.9 C) 98.6 F (37 C) 98.4 F (36.9 C) 98.5 F (36.9 C)  TempSrc: Oral Oral Oral Oral  Resp:      Height:      Weight:      SpO2: 98% 100% 100% 100%   I&O's:   Intake/Output Summary (Last 24 hours) at 09/04/14 0931 Last data filed at 09/03/14 1000  Gross per 24 hour  Intake    180 ml  Output      0 ml  Net    180 ml   TELEMETRY: Reviewed telemetry pt in NSR:     PHYSICAL EXAM General: Well developed, well nourished, in no acute distress Head: Eyes PERRLA, No xanthomas.   Normal cephalic and atramatic  Lungs:   Clear bilaterally to auscultation and percussion. Heart:   HRRR S1 S2 Pulses are 2+ & equal. Abdomen: Bowel sounds are positive, abdomen soft and non-tender without masses Extremities:   No clubbing, cyanosis or edema.  DP +1 Neuro: Alert and oriented X 3. Psych:  Good affect, responds appropriately   LABS: Basic Metabolic Panel:  Recent Labs  09/02/14 1225 09/03/14 1300 09/04/14 0450  NA 137 137 140  K 4.4 4.0 4.6  CL 104 102 106  CO2 23 24 20   GLUCOSE 93 87 91  BUN 7 12 13   CREATININE 0.65 0.90 0.90  CALCIUM 9.1 9.1 9.0  MG 2.0  --   --    Liver Function Tests: No results for input(s): AST, ALT, ALKPHOS, BILITOT, PROT, ALBUMIN in the last 72 hours. No results for input(s): LIPASE, AMYLASE in the last 72 hours. CBC:  Recent Labs  09/02/14 0216 09/02/14 1225  WBC 7.7 8.4  HGB 12.1 11.7*  HCT 35.9* 34.2*  MCV 84.3 83.8  PLT 162 167   Cardiac Enzymes:  Recent Labs  09/03/14 1300 09/03/14 1900 09/04/14 0450  TROPONINI 7.13* 4.91* 3.13*   BNP: Invalid input(s): POCBNP D-Dimer: No results for input(s): DDIMER in the last 72 hours. Hemoglobin A1C:  Recent Labs  09/02/14 0414  HGBA1C 6.2*   Fasting Lipid  Panel:  Recent Labs  09/02/14 0414  CHOL 260*  HDL 39*  LDLCALC 189*  TRIG 162*  CHOLHDL 6.7   Thyroid Function Tests: No results for input(s): TSH, T4TOTAL, T3FREE, THYROIDAB in the last 72 hours.  Invalid input(s): FREET3 Anemia Panel: No results for input(s): VITAMINB12, FOLATE, FERRITIN, TIBC, IRON, RETICCTPCT in the last 72 hours. Coag Panel:   No results found for: INR, PROTIME  RADIOLOGY: No results found.  ASSESSMENT AND PLAN The patient is a 89F with no prior cardiac history but multiple cardiovascular risk factors including HLD, current smoker, FHx who presents with STEMI now s/p primary PCI with excellent angiographic result  1. STEMI: Inferior with cath showing 2 vessel ASCAD with 50-70% focal mid LAD stenosis, 40% mid OM, 99% mid RCA stenosis s/p PCI of mid RCA. 2D echo with normal LVF. Troponin trending downward.  -continue ASA 34m, ticagrelor 97mbid  - Start Lopressor 12.23m20mID - no ACEi  for now due to  soft BP - hopefully can start as an outpt - she refused statin due to statin intolerance in the past - she says that she has tried all  statins and is intolerant to all of them. She will need to be set up to be seen in lipid clinic to see if she is a candidate for the [place] 9 drug 2. Tobacco abuse -strongly counseled tobacco cessation 3. Hypothyroidism - history of hyperthyroidism s/p RAI ablation -continue synthroid 4. Dyslipidemia with markedly elevated LDL at 189. She needs to be on high dose statin therapy but has apparently not tolerated statins in the past. she refused statin last PM due to statin intolerance in the past - she says that she has tried all statins and is intolerant to all of them. She will need to be set up to be seen in lipid clinic to see if she is a candidate for the [facility] 9 drug 5. GERD 6. Breast CA 7. Multiple sclerosis 8. DVT prophylaxis with SQ Heparin per pharmacy 9. Glucose intolerance with HbGA1C - needs to be  followed up by PCP 10. Wide complex tachycardia - 5 beats on monitor - continue to follow. BP better today so will add low dose BB.  LVF normal.    Stable for D/C home. She would like to follow with Dr. Rockey Situ in [employer]. She will need lipid clinic appt.     Sueanne Margarita, MD  09/04/2014   9:31 AM

## 2014-09-05 NOTE — Op Note (Signed)
PATIENT NAME:  Haley Santos, Haley Santos MR#:  [id number] DATE OF BIRTH:  03-06-1946  DATE OF PROCEDURE:  04/05/2013  PREOPERATIVE DIAGNOSIS: Carcinoma of the right breast.   POSTOPERATIVE DIAGNOSIS: Carcinoma of the right breast.   PROCEDURE: Right partial mastectomy with excision of breast implant and axillary sentinel lymph node biopsy.   SURGEON: Rochel Brome, M.D.   ANESTHESIA: General.   INDICATIONS: This 69 year old female has developed a cancer in the upper aspect right breast. Has had neoadjuvant chemotherapy which did shrink the mass from approximately 4 cm down to 2 cm. The patient also had breast implants dating back to 1982 which are anterior to the deep fascia. She did have a plastic surgery consultation. It appeared that the mass was 3 mm from the implant and removal of the implant was recommended and the patient preferred not to have any subsequent reconstruction. The patient did have preoperative injection of radioactive technetium sulfur colloid.   DESCRIPTION OF PROCEDURE: The patient was placed on the operating table in the supine position under general anesthesia. The right arm was placed on a lateral arm support. The right breast upper arm surrounding chest wall and axilla were prepared with ChloraPrep and draped in a sterile manner.   The mass was palpable in the 12 o'clock position of the right breast approximately 2 cm in dimension, approximately 4 cm from the nipple. There was no grossly palpable mass within the axilla.   The gamma counter was used to study of the axilla and to determine the location of radioactivity in the inferior aspect of the axilla. An obliquely oriented 5 cm incision was made and carried down through subcutaneous tissues through superficial fascia. One traversing artery was divided and controlled with 4-0 chromic suture ligatures. Dissection was carried down deeply within the axilla adjacent to the chest wall using the gamma counter for direction and  identified site of radioactivity. There was a lymph node which was dissected free from surrounding structures and was removed with some surrounding fatty tissue. The lymph node itself was approximately 8 mm in dimension. The ex vivo count was greater than 300 counts per second. The background count was less than 15 counts per second. The sentinel lymph node was sent for frozen section. There was no remaining palpable mass within the axilla. It is noted that during the course of the procedure, a number of small bleeding points were cauterized and hemostasis was subsequently intact.   Next, attention was turned to do the right partial mastectomy. A curvilinear incision was made from approximately 10 o'clock to 2 o'clock position and removed an ellipse of skin which was approximately 14 mm wide and dissected down through subcutaneous tissues and could palpate the mass and use palpation to help determine the extent of resection and dissected down to the capsule circumferentially around the mass and then began to dissect out the capsule with the breast implant. More than half of the operation was spent dissecting out this capsule with the implant and left a small amount of the capsule intact on the deep fascia. The specimen was oriented with the margin markers to mark the medial, lateral, cranial and caudal margins and also the 2 o'clock end of the skin incision was tagged with 3-0 nylon stitch. This specimen was submitted for pathology and to check for margins. The wound was inspected. Several small bleeding points were cauterized. Some calcified capsule remaining was excised and did not need to be sent for pathology.   The axillary wound was  inspected and hemostasis was intact, and the wound was closed with running 4-0 Monocryl subcuticular suture.   The pathologist did call back to report the sentinel lymph node appeared to be negative for macrometastasis.   The breast wound was further inspected. Hemostasis  was intact. The subcutaneous tissues were closed with interrupted 3-0 chromic, and the skin was closed with a running 4-0 Monocryl subcuticular suture.   Subsequently in the pathologist called back reported that the superior margin was the closest margin of approximately 1 mm and did not feel that any further resection was indicated.   Both wounds were treated with Dermabond and allowed to dry. The patient appeared to tolerate the procedure satisfactorily and was then prepared for transfer to the recovery room.     ____________________________ Lenna Sciara. Rochel Brome, MD jws:dp D: 04/05/2013 13:54:27 ET T: 04/05/2013 14:10:53 ET JOB#: [id number]  cc: Loreli Dollar, MD, <Dictator> Loreli Dollar MD ELECTRONICALLY SIGNED 04/05/2013 19:29

## 2014-09-05 NOTE — Op Note (Signed)
PATIENT NAME:  Haley Santos, Haley Santos MR#:  [id number] DATE OF BIRTH:  [birth year]  DATE OF PROCEDURE:  11/07/2012  PREOPERATIVE DIAGNOSIS:  Right breast cancer, need for central venous access.   POSTOPERATIVE DIAGNOSIS:  Right breast cancer, need for central venous access.  OPERATIVE PROCEDURE: Left subclavian power port placement with ultrasound and fluoroscopic guidance.   SURGEON:  Dr. Hervey Ard   ANESTHESIA:  Attended local, 16 mL 1% plain Xylocaine.   ESTIMATED BLOOD LOSS: Minimal.   CLINICAL NOTE: This 69 year old woman has been diagnosed with a T2 carcinoma of the right breast, and is felt to be a candidate for neoadjuvant chemotherapy. Central venous access was requested by the treating oncologist. The patient received Kefzol prior to the procedure.   OPERATIVE NOTE: With the patient under adequate sedation, the area was prepped with ChloraPrep and draped. Xylocaine was used for analgesia. Ultrasound was used to confirm patency of the left subclavian vein. The vein was cannulated under ultrasound guidance on a single stick. The guidewire was advanced, and some resistance was met. Fluoroscopy was used to confirm that the guidewire had hung on the junction of the right brachiocephalic vein and the superior vena cava. Under ultrasound guidance, the guidewire was advanced down into the SVC. The tract was dilated, and the catheter advanced. This, too, hung in the same location, but was advanced into the junction of the right atrium without difficulty. It was tunneled to a pocket on the left anterior chest. The PowerPort was attached. The catheter easily irrigated and aspirated in this position. The port was tacked in 3 positions with 3-0 Prolene suture. The port was flushed with 10 mL of injectable saline. The wound was closed in layers with running 3-0 Vicryl for the adipose layers and running 4-0 Vicryl subcuticular suture for the skin. Benzoin and Steri-Strips followed by Telfa and Tegaderm  dressing was applied.   Erect portable chest x-ray in the recovery room showed no evidence of pneumothorax. Catheter tip as described above.   The patient tolerated the procedure well.     ____________________________ Robert Bellow, MD jwb:mr D: 11/07/2012 14:48:36 ET T: 11/07/2012 20:45:22 ET JOB#: [id number]  cc: Robert Bellow, MD, <Dictator> Einar Pheasant, MD Rae Halsted. Kallie Edward, MD   Sharah Finnell Amedeo Kinsman MD ELECTRONICALLY SIGNED 11/08/2012 9:25

## 2014-09-05 NOTE — Consult Note (Signed)
Reason for Visit: This 69 year old Female patient presents to the clinic for initial evaluation of  breast cancer .   Referred by Dr. Kallie Edward.  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 Tumor was ER/PR positive HER-2/neu negative. Now for adjuvant whole breast radiation  Pathology Report pathology report reviewed   Imaging Report mammograms ultrasound reviewed   Referral Report clinical notes reviewed   Planned Treatment Regimen whole breast radiation therapy plus aromatase inhibitor   HPI   patient is a 69 year old female who presents with a self is covered mass in the right breast. She underwent mammogram on Sep 19, 2012 which was abnormal showing bilateral breast implants with a parenchymal density in the upper-outer portion of the right breast with ultrasound confirming a solid lesion. Lesion on ultrasound measuring approximately 3.2 cm. Breast MRI confirmed lesion and size of 2.6 cm with no additional lesions noted. Biopsy was performed showing invasive lobular carcinoma strongly ER/PR positive HER-2/neu not overexpressed. Patient underwent neoadjuvant chemotherapy consisting of Cytoxan Adriamycin and Taxol.she did develop peripheral neuropathy which cut short her Taxol treatments. She then underwent a wide local excision and sentinel node biopsy for a 2.2 cm invasive lobular carcinoma. Margins were clear at 0.5 mm. One sentinel lymph node was negative for metastatic disease. Tumor was overall grade 2. She has done well postoperatively and is now seen for consideration of radiation therapy. She specifically denies breast tenderness cough or bone pain. Still is having some bruising around her scar site have asked her to discontinue her daily 81 mg of aspirin.  Past Hx:    Thyroid ablation:    anemia:     Hypercholesterolemia:    glau:    GERD - Esophageal Reflux:    hyperthyroidism:    Multiple Sclerosis:    Crohn's Disease:    Asthma:    Breast Cancer:    Breast Enhancement:    Colonoscopy: 2010   Oopherectomy: right   Tonsillectomy and Adenoidectomy:   Past, Family and Social History:  Past Medical History positive   Cardiovascular hyperlipidemia   Respiratory asthma   Gastrointestinal GERD; Crohn's disease   Endocrine hyperthyroidism; thyroid ablation   Neurological/Psychiatric MS   Past Surgical History tonsillectomy, Yves Dill rectum he,breast augmentation bilaterally   Past Medical History Comments anemia, glaucoma   Family History noncontributory   Social History positive   Social History Comments 50-pack-year smoking history. EtOH use is socially   Additional Past Medical and Surgical History seen by herself today   Allergies:   Clindamycin: Other, GI Distress  Codeine: Hallucinations  Shellfish: N/V/Diarrhea  Adhesive: Blisters  Valium: Other  Home Meds:  Home Medications: Medication Instructions Status  fluconazole 100 mg oral tablet 1 tab(s) orally once a day (in the morning) Active  omeprazole 20 mg oral delayed release capsule 1 cap(s) orally once a day (in the morning) Active  ProAir HFA CFC free 90 mcg/inh inhalation aerosol 2 puff(s) inhaled 4 times a day, As Needed - for Wheezing, for Shortness of Breath  Active  travoprost ophthalmic 0.004% ophthalmic solution 1 drop(s) to each affected eye once a day (in the evening) Active  Advair Diskus 250 mcg-50 mcg inhalation powder 1 puff(s) inhaled 2 times a day Active  Saline Nasal Mist   , As Needed Active  cholecalciferol 1000 intl units oral tablet 2 tab(s) orally once a  day (in the morning) Active  cyanocobalamin 1 tab(s) orally once a day (in the morning) Active  levothyroxine 125 mcg (0.125 mg) oral capsule 1 cap(s) orally once a day (in the morning) Active  Norco 325 mg-5 mg oral  tablet 1-2 tablets orally every 4 hours as needed for pain Active  Magnesium 42m 1 tab(s) orally once a day (in the morning) Active   Review of Systems:  General negative   Performance Status (ECOG) 0   Skin negative   Breast see HPI   Ophthalmologic negative   ENMT negative   Respiratory and Thorax negative   Cardiovascular negative   Gastrointestinal negative   Genitourinary negative   Musculoskeletal negative   Neurological negative   Psychiatric negative   Hematology/Lymphatics negative   Endocrine see HPI   Allergic/Immunologic negative   Review of Systems   except for a glaucoma, hyperthyroidism and Crohn's disease according to the nurses notes review of systems was obtained is  Nursing Notes:  Nursing Vital Signs and Chemo Nursing Nursing Notes: *CC Vital Signs Flowsheet:   10-Dec-14 09:50  Temp Temperature 97.5  Pulse Pulse 88  Respirations Respirations 20  SBP SBP 137  DBP DBP 78  Pain Scale (0-10)  0  Current Weight (kg) (kg) 87.5  Height (cm) centimeters 169  BSA (m2) 1.9    10:21  Temp Temperature 97.5  Pulse Pulse 88  Respirations Respirations 20  SBP SBP 137  DBP DBP 78  Pain Scale (0-10)  0  Current Weight (kg) (kg) 87.5  Height (cm) centimeters 169  BSA (m2) 1.9   Physical Exam:  General/Skin/HEENT:  General normal   Skin normal   Eyes normal   ENMT normal   Head and Neck normal   Additional PE well-developed well-nourished female in NAD. She status post wide local excision and sentinel node biopsy the right breast. I believe her breast implant has been replaced although during operative report his stated as was removed completely replaced with a new one. There is good symptom symmetric between both breasts. No dominant mass or nodularity is noted in either breast into position examined. No axillary or supraclavicular adenopathy is appreciated. Lungs are clear to A&P cardiac examination shows regular rate and rhythm.    Breasts/Resp/CV/GI/GU:  Respiratory and Thorax normal   Cardiovascular normal   Gastrointestinal normal   Genitourinary normal   MS/Neuro/Psych/Lymph:  Musculoskeletal normal   Neurological normal   Lymphatics normal   Other Results:  Radiology Results: [place]    07-May-14 10:59, [place]   REASON FOR EXAM:    RT BRST LUMP 12 OCLOCK  COMMENTS:       PROCEDURE: [place] - [place]  - Sep 19 2012 10:59AM     RESULT: Ultrasound of the right breast reveals a 3.2 cm solid lesion with   shadowing in the upper portion of the right breast corresponding to the   palpable abnormality. Surgical evaluation of this lesion is suggested as   malignancy cannot be excluded.    IMPRESSION:  Prominent solid lesion upper aspect of right breast for   which surgical evaluation suggested.    BI-RADS: Category 4 - Suspicious Abnormality - Biopsy Should Be Considered  Thank you for the oppurtunity to contribute to the care of your patient. .        Verified By: TOsa Craver M.D., MD  LabUnknown:  PACS Image     07-May-14 11:03, Digital Diagnostic  Mammogram Bilateral  PACS [place]:  Digital Diagnostic Mammogram Bilateral   REASON FOR EXAM:    RT BRST LUMP 12 OCLOCK  COMMENTS:       PROCEDURE: MAM - MAM DGTL DIAGNOSTIC MAMMO W/CAD  - Sep 19 2012 11:03AM     RESULT: Of mammogram obtained compared to prior studies dating to   05/25/2005. Bilateral breast implants again noted and unchanged in   appearance. Adjacent calcifications are present. Deformity noted about   the implants. Parenchymal density  in the upper portion of the right   breast,ultrasound was performed and reveals a solid shadowing lesion .   This could represent malignancy and surgical evaluation suggested.    IMPRESSION:   BI-RADS: Category 4 - Suspicious Abnormality - Biopsy Should Be Considered  Thank you for the oppurtunity to contribute to the care of your patient.  Marland Kitchen    BREAST COMPOSITION: The breast composition is EXTREMELY DENSE (glandular   tissue greater than 75%) This decreases the sensitivity of mammography.        Verified By: Osa Craver, M.D., MD   Relevent Results:   Relevant Scans and Labs mammograms ultrasound reviewed   Assessment and Plan: Impression:   stage IIa invasive lobular carcinoma the right breast status post neoadjuvant chemotherapy and wide local excision in75 year old female ER/PR positive now for adjuvant whole breast radiation plus aromatase inhibitor Plan:   at this time I recommended going ahead with radiation therapy to her whole breast. Based on her prior history of breast implant will treat her up to 5000 cGy over 5 weeks. Based on her close scar margin would also boost or scar another 1600 cGy using electron beam. Risks and benefits of treatment including possible contracture around her implant, fatigue, skin reaction, alteration blood counts, all were explained in detail to the patient. She seems to comprehend my treatment plan well. I've asked her to discontinue her daily 81 mg of aspirin to promote some reduction in her breast bruising. I have set her up for CT simulation next week. Patient will also be a candidate for aromatase inhibitor after completion of radiation.  I would like to take this opportunity to thank you for allowing me to continue to participate in this patient's care.  CC Referral:  cc: Dr. Einar Pheasant, Dr. Tamala Julian   Electronic Signatures: Baruch Gouty, Roda Shutters (MD)  (Signed 10-Dec-14 11:02)  Authored: HPI, Diagnosis, Past Hx, PFSH, Allergies, Home Meds, ROS, Nursing Notes, Physical Exam, Other Results, Relevent Results, Encounter Assessment and Plan, CC Referring Physician   Last Updated: 10-Dec-14 11:02 by Armstead Peaks (MD)

## 2014-09-09 ENCOUNTER — Other Ambulatory Visit: Payer: Self-pay | Admitting: Internal Medicine

## 2014-09-11 ENCOUNTER — Encounter: Payer: Medicare Other | Admitting: Pharmacist

## 2014-09-19 ENCOUNTER — Encounter: Payer: Self-pay | Admitting: Cardiovascular Disease

## 2014-09-19 ENCOUNTER — Ambulatory Visit (INDEPENDENT_AMBULATORY_CARE_PROVIDER_SITE_OTHER): Payer: Medicare Other | Admitting: Cardiovascular Disease

## 2014-09-19 VITALS — BP 140/70 | HR 87 | Ht 66.5 in | Wt 204.5 lb

## 2014-09-19 DIAGNOSIS — E7800 Pure hypercholesterolemia, unspecified: Secondary | ICD-10-CM

## 2014-09-19 DIAGNOSIS — I779 Disorder of arteries and arterioles, unspecified: Secondary | ICD-10-CM | POA: Insufficient documentation

## 2014-09-19 DIAGNOSIS — I6522 Occlusion and stenosis of left carotid artery: Secondary | ICD-10-CM

## 2014-09-19 DIAGNOSIS — Z720 Tobacco use: Secondary | ICD-10-CM

## 2014-09-19 DIAGNOSIS — I10 Essential (primary) hypertension: Secondary | ICD-10-CM

## 2014-09-19 DIAGNOSIS — I2119 ST elevation (STEMI) myocardial infarction involving other coronary artery of inferior wall: Secondary | ICD-10-CM | POA: Diagnosis not present

## 2014-09-19 DIAGNOSIS — I6529 Occlusion and stenosis of unspecified carotid artery: Secondary | ICD-10-CM | POA: Insufficient documentation

## 2014-09-19 DIAGNOSIS — I2510 Atherosclerotic heart disease of native coronary artery without angina pectoris: Secondary | ICD-10-CM

## 2014-09-19 MED ORDER — ROSUVASTATIN CALCIUM 5 MG PO TABS: 5.0000 mg | ORAL_TABLET | Freq: Every day | ORAL | Status: DC

## 2014-09-19 NOTE — Patient Instructions (Addendum)
You are doing well.  Please try crestor 5 mg twice per week  Consider trying CoEnz Q10 for muscle ache  Please call us if you have new issues that need to be addressed before your next appt.  Your physician wants you to follow-up in: 6 months.  You will receive a reminder letter in the mail two months in advance. If you don't receive a letter, please call our office to schedule the follow-up appointment.

## 2014-09-19 NOTE — Progress Notes (Signed)
Patient ID: Haley Santos, female    DOB: April 08, 1946, 69 y.o.   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 April 2016 she developed acute chest pain, bilateral arm pain, neck pain/jaw pain bilaterally.  She had cardiac catheterization, essentially occluded mid RCA with 50-70% mid LAD disease, DES placed the RCA.  In follow-up today she reports that she has rare very mild jaw pain on one side with heavy exertion Otherwise has been very active No regular exercise, weight has been a problem  She has seen [facility] in the past and unable to tolerate statin secondary to myalgias. She reports a history of MS  EKG on today's visit shows normal sinus rhythm with rate 87 bpm, T-wave abnormality in the inferior leads consistent with ischemia  Review of her carotid ultrasound with her shows 50-69% disease on the left Review of her lab work with her shows total cholesterol 260. Elevated hemoglobin A1c greater than 6  We discussed her statin history. She is unclear which medication she has tried in the past but she reports that she has tested the mall and typically they all cause myalgias.   Allergies  Allergen Reactions  . Statins Other (See Comments)    Muscle weakness/pain. Trials of multiple agents.  . Clindamycin/Lincomycin   . Codeine   . Shellfish Allergy Nausea And Vomiting    Felt goofy headed  . Valium [Diazepam]     Current Outpatient Prescriptions on File Prior to Visit  Medication Sig Dispense Refill  . ADVAIR DISKUS 250-50 MCG/DOSE AEPB INHALE ONE DOSE BY MOUTH TWICE DAILY. RINSE MOUTH AFTER USE 60 each 0  . aspirin 81 MG chewable tablet Chew 1 tablet (81 mg total) by mouth daily.    . cholecalciferol  (VITAMIN D) 1000 UNITS tablet Take 2,000 Units by mouth daily.    . Cyanocobalamin (VITAMIN B 12 PO) Take by mouth daily.    Marland Kitchen latanoprost (XALATAN) 0.005 % ophthalmic solution Place 1-2 drops into both eyes daily.    Marland Kitchen levothyroxine (SYNTHROID, LEVOTHROID) 125 MCG tablet Take 1 tablet (125 mcg total) by mouth daily. 90 tablet 1  . Magnesium Oxide 400 MG CAPS Take 400 mg by mouth daily.    . metoprolol tartrate (LOPRESSOR) 25 MG tablet Take 0.5 tablets (12.5 mg total) by mouth 2 (two) times daily. 60 tablet 11  . omeprazole (PRILOSEC OTC) 20 MG tablet Take 20 mg by mouth daily.    . ticagrelor (BRILINTA) 90 MG TABS tablet Take 1 tablet (90 mg total) by mouth 2 (two) times daily. 60 tablet 11  . VENTOLIN HFA 108 (90 BASE) MCG/ACT inhaler INHALE TWO PUFFS BY MOUTH EVERY 6 HOURS AS NEEDED FOR WHEEZING FOR SHORTNESS OF BREATH 54 each 0   No current facility-administered medications on file prior to visit.    Past Medical History  Diagnosis Date  . Asthma with bronchitis   . Multiple sclerosis   . Hyperthyroidism     a. s/p ablation maintained on synthroid  . GERD (gastroesophageal reflux disease)   . Glaucoma   . Hypercholesterolemia   . Breast cancer   . Crohn's disease   . Tobacco abuse   . Glucose intolerance (impaired glucose tolerance)     a. HgA1c 6.2  during 08/2014 admission   . CAD (coronary artery disease)     a. 08/2014: inf STEMI s/p DES to RCA.     Past Surgical History  Procedure Laterality Date  . Tonsillectomy and adenoidectomy  1957  . Right oophorectomy  1979  . Colonoscopy  2010    Dr Vira Agar  . Breast enhancement surgery  [place]  . Portacath placement  11/07/2012  . Left heart catheterization with coronary angiogram N/A 09/01/2014    Procedure: LEFT HEART CATHETERIZATION WITH CORONARY ANGIOGRAM;  Surgeon: Peter M Martinique, MD;  Location: [facility] C[facility] LAB;  Service: Cardiovascular;  Laterality: N/A;    Social History  reports that she has been smoking  Cigarettes.  She has a 20 pack-year smoking history. She has never used smokeless tobacco. She reports that she does not drink alcohol or use illicit drugs.  Family History family history includes Diabetes in her mother; Glaucoma in her mother; Berenice Primas' disease in her mother; Heart attack (age of onset: 91) in her mother; Heart disease in her mother and another family member; Lung disease in her father; Peripheral vascular disease in an other family member.  Lab Results  Component Value Date   CHOL 260* 09/02/2014   HDL 39* 09/02/2014   LDLCALC 189* 09/02/2014   TRIG 162* 09/02/2014    Review of Systems  Constitutional: Negative.   Respiratory: Negative.   Cardiovascular: Negative.   Gastrointestinal: Negative.   Musculoskeletal: Negative.   Skin: Negative.   Neurological: Negative.   Hematological: Negative.   Psychiatric/Behavioral: Negative.   All other systems reviewed and are negative.   BP 140/70 mmHg  Pulse 87  Ht 5' 6.5" (1.689 m)  Wt 204 lb 8 oz (92.761 kg)  BMI 32.52 kg/m2  LMP 06/15/1977  Physical Exam  Constitutional: She is oriented to person, place, and time. She appears well-developed and well-nourished.  HENT:  Head: Normocephalic.  Nose: Nose normal.  Mouth/Throat: Oropharynx is clear and moist.  Eyes: Conjunctivae are normal. Pupils are equal, round, and reactive to light.  Neck: Normal range of motion. Neck supple. No JVD present.  Cardiovascular: Normal rate, regular rhythm, S1 normal, S2 normal, normal heart sounds and intact distal pulses.  Exam reveals no gallop and no friction rub.   No murmur heard. Pulmonary/Chest: Effort normal and breath sounds normal. No respiratory distress. She has no wheezes. She has no rales. She exhibits no tenderness.  Abdominal: Soft. Bowel sounds are normal. She exhibits no distension. There is no tenderness.  Musculoskeletal: Normal range of motion. She exhibits no edema or tenderness.  Lymphadenopathy:    She has no  cervical adenopathy.  Neurological: She is alert and oriented to person, place, and time. Coordination normal.  Skin: Skin is warm and dry. No rash noted. No erythema.  Psychiatric: She has a normal mood and affect. Her behavior is normal. Judgment and thought content normal.    Assessment and Plan  Nursing note and vitals reviewed.

## 2014-09-19 NOTE — Assessment & Plan Note (Signed)
We have stressed the importance of taking her antiplatelet medications. She reports that she did receive free brilinta for one year

## 2014-09-19 NOTE — Assessment & Plan Note (Signed)
Long discussion about her coronary disease, results on her catheterization She also has 50-70% mid LAD disease. Minimal jaw pain with exertion. We have stressed importance of letting us know if symptoms get worse, stress test would be ordered to rule out ischemia from the LAD lesion.

## 2014-09-19 NOTE — Assessment & Plan Note (Signed)
Moderate disease on the left Stressed importance of improved cholesterol and smoking cessation

## 2014-09-19 NOTE — Assessment & Plan Note (Signed)
She will try Crestor 5 mg 2 days per week, 3 days per week if tolerated We discussed starting Zetia 10 mg daily. This will go generic later this year She would likely be a candidate for praluent or repatha. Given her minimal income, might benefit for assistance from the company. She would like to think about this

## 2014-09-19 NOTE — Assessment & Plan Note (Signed)
We have encouraged her to continue to work on weaning her cigarettes and smoking cessation. She will continue to work on this and does not want any assistance with chantix.  She has cut down her volume by half per the patient

## 2014-09-22 ENCOUNTER — Telehealth: Payer: Self-pay | Admitting: *Deleted

## 2014-09-22 ENCOUNTER — Other Ambulatory Visit: Payer: Self-pay

## 2014-09-22 ENCOUNTER — Encounter: Payer: Medicare Other | Attending: Cardiovascular Disease | Admitting: *Deleted

## 2014-09-22 VITALS — Ht 65.8 in | Wt 201.8 lb

## 2014-09-22 DIAGNOSIS — I2101 ST elevation (STEMI) myocardial infarction involving left main coronary artery: Secondary | ICD-10-CM

## 2014-09-22 DIAGNOSIS — I221 Subsequent ST elevation (STEMI) myocardial infarction of inferior wall: Secondary | ICD-10-CM

## 2014-09-22 DIAGNOSIS — I214 Non-ST elevation (NSTEMI) myocardial infarction: Secondary | ICD-10-CM | POA: Insufficient documentation

## 2014-09-22 NOTE — Telephone Encounter (Signed)
Rehab needing order to be singed so that patient to start cardiac rehab. She is coming today at 130pm.  Please fax to or sing it in epic,please Fax: [phone]

## 2014-09-22 NOTE — Progress Notes (Signed)
Cardiac Individual Treatment Plan  Patient Details  Name: Haley Santos MRN: [id number] Date of Birth: 1945/11/25 Referring Provider:  Minna Merritts, MD  Initial Encounter Date:    Patient's Home Medications on Admission:  Current outpatient prescriptions:  .  ADVAIR DISKUS 250-50 MCG/DOSE AEPB, INHALE ONE DOSE BY MOUTH TWICE DAILY. RINSE MOUTH AFTER USE, Disp: 60 each, Rfl: 0 .  aspirin 81 MG chewable tablet, Chew 1 tablet (81 mg total) by mouth daily., Disp: , Rfl:  .  cholecalciferol (VITAMIN D) 1000 UNITS tablet, Take 2,000 Units by mouth daily., Disp: , Rfl:  .  Cyanocobalamin (VITAMIN B 12 PO), Take by mouth daily., Disp: , Rfl:  .  latanoprost (XALATAN) 0.005 % ophthalmic solution, Place 1-2 drops into both eyes daily., Disp: , Rfl:  .  levothyroxine (SYNTHROID, LEVOTHROID) 125 MCG tablet, Take 1 tablet (125 mcg total) by mouth daily., Disp: 90 tablet, Rfl: 1 .  Magnesium Oxide 400 MG CAPS, Take 400 mg by mouth daily., Disp: , Rfl:  .  metoprolol tartrate (LOPRESSOR) 25 MG tablet, Take 0.5 tablets (12.5 mg total) by mouth 2 (two) times daily., Disp: 60 tablet, Rfl: 11 .  omeprazole (PRILOSEC OTC) 20 MG tablet, Take 20 mg by mouth daily., Disp: , Rfl:  .  rosuvastatin (CRESTOR) 5 MG tablet, Take 1 tablet (5 mg total) by mouth daily., Disp: 30 tablet, Rfl: 6 .  ticagrelor (BRILINTA) 90 MG TABS tablet, Take 1 tablet (90 mg total) by mouth 2 (two) times daily., Disp: 60 tablet, Rfl: 11 .  VENTOLIN HFA 108 (90 BASE) MCG/ACT inhaler, INHALE TWO PUFFS BY MOUTH EVERY 6 HOURS AS NEEDED FOR WHEEZING FOR SHORTNESS OF BREATH, Disp: 4 each, Rfl: 0  Past Medical History: Past Medical History  Diagnosis Date  . Asthma with bronchitis   . Multiple sclerosis   . Hyperthyroidism     a. s/p ablation maintained on synthroid  . GERD (gastroesophageal reflux disease)   . Glaucoma   . Hypercholesterolemia   . Breast cancer   . Crohn's disease   . Tobacco abuse   . Glucose intolerance  (impaired glucose tolerance)     a. HgA1c 6.2 during 08/2014 admission   . CAD (coronary artery disease)     a. 08/2014: inf STEMI s/p DES to RCA.     Tobacco Use: History  Smoking status  . Current Every Day Smoker -- 0.50 packs/day for 40 years  . Types: Cigarettes  Smokeless tobacco  . Never Used    Labs: Recent Review Flowsheet Data    Labs for ITP Cardiac and Pulmonary Rehab Latest Ref Rng 09/12/2012 09/18/2012 09/01/2014 09/02/2014   Cholestrol 0 - 200 mg/dL 248(H) - - 260(H)   LDLCALC 0 - 99 mg/dL - - - 189(H)   LDLDIRECT - 205.3 - - -   HDL >39 mg/dL 33.50(L) - - 39(L)   Trlycerides <150 mg/dL 156.0(H) - - 162(H)   Hemoglobin A1c 4.8 - 5.6 % - 6.6(H) - 6.2(H)   TCO2 0 - 100 mmol/L - - 18 -       Exercise Target Goals:    Exercise Program Goal: Individual exercise prescription set with THRR, safety & activity barriers. Participant demonstrates ability to understand and report RPE using BORG scale, to self-measure pulse accurately, and to acknowledge the importance of the exercise prescription.  Exercise Prescription Goal: Starting with aerobic activity 30 plus minutes a day, 3 days per week for initial exercise prescription. Provide home exercise prescription and  guidelines that participant acknowledges understanding prior to discharge.  Activity Barriers & Risk Stratification:     Activity Barriers & Risk Stratification - 09/22/14 1528    Activity Barriers & Risk Stratification   Activity Barriers Balance Concerns      6 Minute Walk:     6 Minute Walk      09/22/14 1500       6 Minute Walk   Phase Initial     Distance 1250 feet     Walk Time 6 minutes     Resting HR 82 bpm     Resting BP 138/70 mmHg     Max Ex. HR 102 bpm     Max Ex. BP 142/60 mmHg     RPE 12     Symptoms No        Initial Exercise Prescription:     Initial Exercise Prescription - 09/22/14 1500    Treadmill   MPH 2.2   Grade 0   Minutes 15   Bike   Level 0.4   Minutes 10    Recumbant Bike   Level 3   Watts 25   Minutes 15   NuStep   Level 3   Watts 40   Minutes 15   Arm Ergometer   Level 1   Watts 8   Minutes 10   Arm/Foot Ergometer   Level 4   Watts 12   Minutes 10   Cybex   Level 3   RPM 50   Minutes 10   Recumbant Elliptical   Level 1   RPM 40   Watts 10   Minutes 10   Elliptical   Level 1   Speed 3   Minutes 1   REL-XR   Level 3   Watts 40   Minutes 15   Prescription Details   Frequency (times per week) 3   Duration Progress to 30 minutes of continuous aerobic without signs/symptoms of physical distress   Intensity   THRR REST +  20   Ratings of Perceived Exertion 11-13   Progression Continue progressive overload as per policy without signs/symptoms or physical distress.   Resistance Training   Training Prescription Yes   Weight 2   Reps 10-12      Exercise Prescription Changes:   Discharge Exercise Prescription:   Nutrition:  Target Goals: Understanding of nutrition guidelines, daily intake of sodium 1500mg , cholesterol 200mg , calories 30% from fat and 7% or less from saturated fats, daily to have 5 or more servings of fruits and vegetables.  Biometrics:     Pre Biometrics - 09/22/14 1504    Pre Biometrics   Height 5' 5.8" (1.671 m)   Weight 201 lb 12.8 oz (91.536 kg)   Waist Circumference 39 inches   Hip Circumference 46 inches   Waist to Hip Ratio 0.85 %   BMI (Calculated) 32.8       Nutrition Therapy Plan and Nutrition Goals:     Nutrition Therapy & Goals - 09/22/14 1531    Intervention Plan   Intervention Using nutrition plan and personal goals to gain a healthy nutrition lifestyle. Add exercise as prescribed.      Nutrition Discharge: Rate Your Plate Scores:   Nutrition Goals Re-Evaluation:   Psychosocial: Target Goals: Acknowledge presence or absence of depression, maximize coping skills, provide positive support system. Participant is able to verbalize types and ability to use  techniques and skills needed for reducing stress and depression.  Initial Review &  Psychosocial Screening:     Initial Psych Review & Screening - 09/22/14 [place]? Yes   Barriers   Psychosocial barriers to participate in program The patient should benefit from training in stress management and relaxation.  Wants to quit smoking has tried everything in the past incl hypnosis, accupunture. Quit for 2 years until she got her dx of breast cancer.   Screening Interventions   Interventions Encouraged to exercise      Quality of Life Scores:   PHQ-9:     Recent Review Flowsheet Data    Depression screen [facility] 2/9 09/22/2014 09/26/2013   Decreased Interest 0 0   Down, Depressed, Hopeless 0 0   PHQ - 2 Score 0 0   Altered sleeping 0 -   Tired, decreased energy 1 -   Change in appetite 0 -   Feeling bad or failure about yourself  0 -   Trouble concentrating 0 -   Moving slowly or fidgety/restless 0 -   Suicidal thoughts 0 -   PHQ-9 Score 1 -   Difficult doing work/chores Not difficult at all -      Psychosocial Evaluation and Intervention:   Psychosocial Re-Evaluation:   Vocational Rehabilitation: Provide vocational rehab assistance to qualifying candidates.   Vocational Rehab Evaluation & Intervention:     Vocational Rehab - 09/22/14 1530    Initial Vocational Rehab Evaluation & Intervention   Assessment shows need for Vocational Rehabilitation No      Education: Education Goals: Education classes will be provided on a weekly basis, covering required topics. Participant will state understanding/return demonstration of topics presented.  Learning Barriers/Preferences:     Learning Barriers/Preferences - 09/22/14 1529    Learning Barriers/Preferences   Learning Barriers None   Learning Preferences None      Education Topics: General Nutrition Guidelines/Fats and Fiber: -Group instruction provided by verbal, written  material, models and posters to present the general guidelines for heart healthy nutrition. Gives an explanation and review of dietary fats and fiber.   Controlling Sodium/Reading Food Labels: -Group verbal and written material supporting the discussion of sodium use in heart healthy nutrition. Review and explanation with models, verbal and written materials for utilization of the food label.   Exercise Physiology & Risk Factors: - Group verbal and written instruction with models to review the exercise physiology of the cardiovascular system and associated critical values. Details cardiovascular disease risk factors and the goals associated with each risk factor.   Aerobic Exercise & Resistance Training: - Gives group verbal and written discussion on the health impact of inactivity. On the components of aerobic and resistive training programs and the benefits of this training and how to safely progress through these programs.   Flexibility, Balance, General Exercise Guidelines: - Provides group verbal and written instruction on the benefits of flexibility and balance training programs. Provides general exercise guidelines with specific guidelines to those with heart or lung disease. Demonstration and skill practice provided.   Stress Management: - Provides group verbal and written instruction about the health risks of elevated stress, cause of high stress, and healthy ways to reduce stress.   Depression: - Provides group verbal and written instruction on the correlation between heart/lung disease and depressed mood, treatment options, and the stigmas associated with seeking treatment.   Anatomy & Physiology of the Heart: - Group verbal and written instruction and models provide basic cardiac anatomy and physiology, with the coronary electrical and  arterial systems. Review of: AMI, Angina, Valve disease, Heart Failure, Cardiac Arrhythmia, Pacemakers, and the ICD.   Cardiac  Procedures: - Group verbal and written instruction and models to describe the testing methods done to diagnose heart disease. Reviews the outcomes of the test results. Describes the treatment choices: Medical Management, Angioplasty, or Coronary Bypass Surgery.   Cardiac Medications: - Group verbal and written instruction to review commonly prescribed medications for heart disease. Reviews the medication, class of the drug, and side effects. Includes the steps to properly store meds and maintain the prescription regimen.   Go Sex-Intimacy & Heart Disease, Get SMART - Goal Setting: - Group verbal and written instruction through game format to discuss heart disease and the return to sexual intimacy. Provides group verbal and written material to discuss and apply goal setting through the application of the S.M.A.R.T. Method.   Other Matters of the Heart: - Provides group verbal, written materials and models to describe Heart Failure, Angina, Valve Disease, and Diabetes in the realm of heart disease. Includes description of the disease process and treatment options available to the cardiac patient.   Exercise & Equipment Safety: - Individual verbal instruction and demonstration of equipment use and safety with use of the equipment.          Cardiac Rehab from 09/22/2014 in [facility] Cardiac Re[facility]   Date  09/22/14   Educator  C.Ieasha Boerema   Instruction Review Code  1- partially meets, needs review/practice      Infection Prevention: - Provides verbal and written material to individual with discussion of infection control including proper hand washing and proper equipment cleaning during exercise session.      Cardiac Rehab from 09/22/2014 in [facility] Cardiac Rehab   Date  09/22/14   Educator  C.Jermiah Howton   Instruction Review Code  2- meets goals/outcomes      Falls Prevention: - Provides verbal and written material to individual with discussion of falls prevention and safety.      Cardiac Rehab from  09/22/2014 in [facility] Cardiac Rehab   Date  09/22/14   Educator  C.Ta Fair,RN   Instruction Review Code  2- meets goals/outcomes      Diabetes: - Individual verbal and written instruction to review signs/symptoms of diabetes, desired ranges of glucose level fasting, after meals and with exercise. Advice that pre and post exercise glucose checks will be done for 3 sessions at entry of program.    Knowledge Questionnaire Score:     Knowledge Questionnaire Score - 09/22/14 1529    Knowledge Questionnaire Score   Pre Score 26      Personal Goals and Risk Factors at Admission:     Personal Goals and Risk Factors at Admission - 09/22/14 1532    Personal Goals and Risk Factors on Admission    Weight Management Yes   Intervention Learn and follow the exercise and diet guidelines while in the program. Utilize the nutrition and education classes to help gain knowledge of the diet and exercise expectations in the program   Increase Aerobic Exercise and Physical Activity Yes   Intervention While in program, learn and follow the exercise prescription taught. Start at a low level workload and increase workload after able to maintain previous level for 30 minutes. Increase time before increasing intensity.   Quit Smoking Yes   Number of packs per day 1-1.5 packs cigarettes per day   Intervention Utilize your health care professional team to help with smoking cessation while in the program. Your doctor  can prescribe medications to aid in cessation. The program can provide information and counseling as needed.   Diabetes No   Hypertension Yes  She reports white coat syndrome   Goal Participant will see blood pressure controlled within the values of 140/50mm/Hg or within value directed by their physician.   Intervention Provide nutrition & aerobic exercise along with prescribed medications to achieve BP 140/90 or less.   Lipids Yes   Goal Cholesterol controlled with medications as prescribed, with  individualized exercise RX and with personalized nutrition plan. Value goals: LDL < 70mg , HDL > 40mg . Participant states understanding of desired cholesterol values and following prescriptions.   Intervention Provide nutrition & aerobic exercise along with prescribed medications to achieve LDL 70mg , HDL >40mg .      Personal Goals and Risk Factors Review:    Personal Goals Discharge:     Comments:

## 2014-09-22 NOTE — Telephone Encounter (Signed)
Referral sent 

## 2014-09-26 ENCOUNTER — Encounter: Payer: Medicare Other | Admitting: *Deleted

## 2014-09-26 DIAGNOSIS — I2101 ST elevation (STEMI) myocardial infarction involving left main coronary artery: Secondary | ICD-10-CM

## 2014-09-26 DIAGNOSIS — I214 Non-ST elevation (NSTEMI) myocardial infarction: Secondary | ICD-10-CM | POA: Diagnosis not present

## 2014-09-26 NOTE — Progress Notes (Signed)
Daily Session Note  Patient Details  Name: Haley Santos MRN: [id number] Date of Birth: Aug 09, 1945 Referring Provider:  Minna Merritts, MD  Encounter Date: 09/26/2014  Check In:      Goals Met:  Exercise tolerated well No report of cardiac concerns or symptoms Strength training completed today  Goals Unmet:  Not Applicable  Goals Comments: First exercise session. Did well.   Dr. Emily Filbert is Medical Director for [place] and [facility].

## 2014-09-29 ENCOUNTER — Other Ambulatory Visit: Payer: Self-pay

## 2014-09-29 ENCOUNTER — Ambulatory Visit (INDEPENDENT_AMBULATORY_CARE_PROVIDER_SITE_OTHER): Payer: Medicare Other | Admitting: Internal Medicine

## 2014-09-29 ENCOUNTER — Encounter: Payer: Medicare Other | Admitting: *Deleted

## 2014-09-29 ENCOUNTER — Encounter: Payer: Self-pay | Admitting: Internal Medicine

## 2014-09-29 VITALS — BP 119/68 | HR 79 | Temp 97.8°F | Ht 66.0 in | Wt 202.0 lb

## 2014-09-29 DIAGNOSIS — Z720 Tobacco use: Secondary | ICD-10-CM

## 2014-09-29 DIAGNOSIS — G35 Multiple sclerosis: Secondary | ICD-10-CM

## 2014-09-29 DIAGNOSIS — E7800 Pure hypercholesterolemia, unspecified: Secondary | ICD-10-CM

## 2014-09-29 DIAGNOSIS — I214 Non-ST elevation (NSTEMI) myocardial infarction: Secondary | ICD-10-CM | POA: Diagnosis not present

## 2014-09-29 DIAGNOSIS — J4520 Mild intermittent asthma, uncomplicated: Secondary | ICD-10-CM | POA: Diagnosis not present

## 2014-09-29 DIAGNOSIS — I2101 ST elevation (STEMI) myocardial infarction involving left main coronary artery: Secondary | ICD-10-CM | POA: Diagnosis not present

## 2014-09-29 DIAGNOSIS — I10 Essential (primary) hypertension: Secondary | ICD-10-CM | POA: Diagnosis not present

## 2014-09-29 DIAGNOSIS — I6522 Occlusion and stenosis of left carotid artery: Secondary | ICD-10-CM

## 2014-09-29 DIAGNOSIS — I213 ST elevation (STEMI) myocardial infarction of unspecified site: Secondary | ICD-10-CM

## 2014-09-29 DIAGNOSIS — I2510 Atherosclerotic heart disease of native coronary artery without angina pectoris: Secondary | ICD-10-CM | POA: Diagnosis not present

## 2014-09-29 DIAGNOSIS — K219 Gastro-esophageal reflux disease without esophagitis: Secondary | ICD-10-CM

## 2014-09-29 DIAGNOSIS — E038 Other specified hypothyroidism: Secondary | ICD-10-CM

## 2014-09-29 DIAGNOSIS — C50411 Malignant neoplasm of upper-outer quadrant of right female breast: Secondary | ICD-10-CM

## 2014-09-29 NOTE — Progress Notes (Signed)
Pre visit review using our clinic review tool, if applicable. No additional management support is needed unless otherwise documented below in the visit note. 

## 2014-09-29 NOTE — Progress Notes (Signed)
Daily Session Note  Patient Details  Name: KELECHI ASTARITA MRN: [id number] Date of Birth: 01/03/1946 Referring Provider:  Einar Pheasant, MD  Encounter Date: 09/29/2014  Check In:     Session Check In - 09/29/14 0856    Check-In   Staff Present Heath Lark RN, BSN, CCRP;Kelly Hayes BS, ACSM CEP Exercise Physiologist;Britnee Mcdevitt Dillard Essex MS, ACSM CEP Exercise Physiologist   ER physicians immediately available to respond to emergencies See telemetry face sheet for immediately available ER MD   Medication changes reported     No   Fall or balance concerns reported    No   Warm-up and Cool-down Performed on first and last piece of equipment   VAD Patient? No   Pain Assessment   Currently in Pain? No/denies   Multiple Pain Sites No         Goals Met:  Independence with exercise equipment Exercise tolerated well Personal goals reviewed No report of cardiac concerns or symptoms  Goals Unmet:  Not Applicable  Goals Comments:    Dr. Emily Filbert is Medical Director for [place] and [facility].

## 2014-10-01 DIAGNOSIS — I214 Non-ST elevation (NSTEMI) myocardial infarction: Secondary | ICD-10-CM

## 2014-10-01 NOTE — Progress Notes (Signed)
Daily Session Note  Patient Details  Name: Haley Santos MRN: [id number] Date of Birth: 14-Apr-1946 Referring Provider:  Einar Pheasant, MD  Encounter Date: 10/01/2014  Check In:     Session Check In - 10/01/14 0840    Check-In   Staff Present Heath Lark RN, BSN, CCRP;Kyrin Garn BS, ACSM EP-C, Exercise Physiologist;Renee Dillard Essex MS, ACSM CEP Exercise Physiologist   ER physicians immediately available to respond to emergencies See telemetry face sheet for immediately available ER MD   Medication changes reported     No   Fall or balance concerns reported    No   Warm-up and Cool-down Performed on first and last piece of equipment   VAD Patient? No   Pain Assessment   Currently in Pain? No/denies         Goals Met:  Proper associated with RPD/PD & O2 Sat Exercise tolerated well No report of cardiac concerns or symptoms Strength training completed today  Goals Unmet:  Not Applicable  Goals Comments:    Dr. Emily Filbert is Medical Director for [place] and [facility].

## 2014-10-03 ENCOUNTER — Encounter: Payer: Medicare Other | Admitting: *Deleted

## 2014-10-03 ENCOUNTER — Encounter: Payer: Self-pay | Admitting: Cardiovascular Disease

## 2014-10-03 DIAGNOSIS — I214 Non-ST elevation (NSTEMI) myocardial infarction: Secondary | ICD-10-CM | POA: Diagnosis not present

## 2014-10-03 NOTE — Progress Notes (Signed)
Daily Session Note  Patient Details  Name: Haley Santos MRN: [id number] Date of Birth: 06/08/1945 Referring Provider:  Einar Pheasant, MD  Encounter Date: 10/03/2014  Check In:     Session Check In - 10/03/14 0919    Check-In   Staff Present Heath Lark RN, BSN, CCRP;Carroll Enterkin RN, Drusilla Kanner MS, ACSM CEP Exercise Physiologist   ER physicians immediately available to respond to emergencies See telemetry face sheet for immediately available ER MD   Medication changes reported     No   Fall or balance concerns reported    No   Warm-up and Cool-down Performed on first and last piece of equipment   VAD Patient? No   Pain Assessment   Currently in Pain? No/denies         Goals Met:  Exercise tolerated well No report of cardiac concerns or symptoms Strength training completed today  Goals Unmet:  Not Applicable  Goals Comments:    Dr. Emily Filbert is Medical Director for [place] and [facility].

## 2014-10-06 ENCOUNTER — Encounter: Payer: Self-pay | Admitting: Internal Medicine

## 2014-10-06 ENCOUNTER — Encounter: Payer: Medicare Other | Admitting: *Deleted

## 2014-10-06 DIAGNOSIS — I2101 ST elevation (STEMI) myocardial infarction involving left main coronary artery: Secondary | ICD-10-CM

## 2014-10-06 DIAGNOSIS — I214 Non-ST elevation (NSTEMI) myocardial infarction: Secondary | ICD-10-CM | POA: Diagnosis not present

## 2014-10-06 DIAGNOSIS — Z853 Personal history of malignant neoplasm of breast: Secondary | ICD-10-CM | POA: Diagnosis not present

## 2014-10-06 NOTE — Assessment & Plan Note (Signed)
Continue inhalers.  Breathing stable.

## 2014-10-06 NOTE — Assessment & Plan Note (Signed)
Stable.  Declines further w/up.

## 2014-10-06 NOTE — Assessment & Plan Note (Signed)
On omeprazole.  Symptoms controlled.

## 2014-10-06 NOTE — Assessment & Plan Note (Signed)
On crestor and tolerating.  Follow lipid panel and liver function tests.

## 2014-10-06 NOTE — Assessment & Plan Note (Signed)
On lisinopril.  Pressure doing well.  Follow metabolic panel.

## 2014-10-06 NOTE — Assessment & Plan Note (Signed)
Is s/p chemotherapy.  Completed XRT.  Continues to f/u with oncology, Dr Tamala Julian and Dr Donella Stade.

## 2014-10-06 NOTE — Progress Notes (Signed)
Subjective:  Patient ID: Haley Santos, female    DOB: June 04, 1945  Age: 69 y.o. MRN: [id number]  CC: The primary encounter diagnosis was Coronary artery disease involving native coronary artery of native heart without angina pectoris. Diagnoses of Carotid artery stenosis, left, Essential hypertension, Asthma, mild intermittent, uncomplicated, Gastroesophageal reflux disease, esophagitis presence not specified, Other specified hypothyroidism, Multiple sclerosis, Breast cancer of upper-outer quadrant of right female breast, Hypercholesterolemia, and Tobacco abuse were also pertinent to this visit.  HPI Haley Santos presents to follow up on her current medical issues as well as for her physical exam.  She was recently hospitalized with MI.  Cardiac cath as outlined.  S/p RCA stent placement.  Has mid LAD disease - 50-70%.  Going to cardiac rehab.  Doing well with rehab.  Feels better.  Is taking low dose crestor and tolerating.  Taking 2 days per week currently.  Breathing stable.  Eating and drinking well.  Bowels stable.  Is scheduled for f/u mammogram 10/27/14.  Will see Dr Tamala Julian as well.  Has f/u at the cancer center in 11/2014.     Past Medical History  Diagnosis Date  . Asthma with bronchitis   . Multiple sclerosis   . Hyperthyroidism     a. s/p ablation maintained on synthroid  . GERD (gastroesophageal reflux disease)   . Glaucoma   . Hypercholesterolemia   . Breast cancer   . Crohn's disease   . Tobacco abuse   . Glucose intolerance (impaired glucose tolerance)     a. HgA1c 6.2 during 08/2014 admission   . CAD (coronary artery disease)     a. 08/2014: inf STEMI s/p DES to RCA.     Outpatient Prescriptions Prior to Visit  Medication Sig Dispense Refill  . ADVAIR DISKUS 250-50 MCG/DOSE AEPB INHALE ONE DOSE BY MOUTH TWICE DAILY. RINSE MOUTH AFTER USE 60 each 0  . aspirin 81 MG chewable tablet Chew 1 tablet (81 mg total) by mouth daily.    . cholecalciferol (VITAMIN D) 1000 UNITS  tablet Take 2,000 Units by mouth daily.    . Cyanocobalamin (VITAMIN B 12 PO) Take by mouth daily.    Marland Kitchen latanoprost (XALATAN) 0.005 % ophthalmic solution Place 1-2 drops into both eyes daily.    Marland Kitchen levothyroxine (SYNTHROID, LEVOTHROID) 125 MCG tablet Take 1 tablet (125 mcg total) by mouth daily. 90 tablet 1  . Magnesium Oxide 400 MG CAPS Take 400 mg by mouth daily.    . metoprolol tartrate (LOPRESSOR) 25 MG tablet Take 0.5 tablets (12.5 mg total) by mouth 2 (two) times daily. 60 tablet 11  . omeprazole (PRILOSEC OTC) 20 MG tablet Take 20 mg by mouth daily.    . rosuvastatin (CRESTOR) 5 MG tablet Take 5 mg by mouth 2 (two) times a week.    . ticagrelor (BRILINTA) 90 MG TABS tablet Take 1 tablet (90 mg total) by mouth 2 (two) times daily. 60 tablet 11  . VENTOLIN HFA 108 (90 BASE) MCG/ACT inhaler INHALE TWO PUFFS BY MOUTH EVERY 6 HOURS AS NEEDED FOR WHEEZING FOR SHORTNESS OF BREATH 54 each 0  . rosuvastatin (CRESTOR) 5 MG tablet Take 1 tablet (5 mg total) by mouth daily. (Patient not taking: Reported on 09/26/2014) 30 tablet 6   No facility-administered medications prior to visit.    ROS Review of Systems  Constitutional: Negative for appetite change and unexpected weight change.  HENT: Negative for congestion and sinus pressure.   Eyes: Negative for pain and visual  disturbance.  Respiratory: Negative for cough, chest tightness and shortness of breath.   Cardiovascular: Negative for chest pain, palpitations and leg swelling.  Gastrointestinal: Negative for nausea, vomiting, abdominal pain and diarrhea.  Genitourinary: Negative for dysuria and difficulty urinating.  Musculoskeletal: Negative for back pain and joint swelling.  Skin: Negative for color change and rash.  Neurological: Negative for dizziness, light-headedness and headaches.  Hematological: Negative for adenopathy. Does not bruise/bleed easily.  Psychiatric/Behavioral: Negative for dysphoric mood and agitation.    Objective:    BP 119/68 mmHg  Pulse 79  Temp(Src) 97.8 F (36.6 C) (Oral)  Ht 5' 6"  (1.676 m)  Wt 202 lb (91.627 kg)  BMI 32.62 kg/m2  SpO2 97%  LMP 06/15/1977  BP Readings from Last 3 Encounters:  09/29/14 119/68  09/19/14 140/70  09/04/14 95/48    Wt Readings from Last 3 Encounters:  09/29/14 202 lb (91.627 kg)  09/22/14 201 lb 12.8 oz (91.536 kg)  09/19/14 204 lb 8 oz (92.761 kg)   Blood pressure recheck:  122/68  Physical Exam  Constitutional: She is oriented to person, place, and time. She appears well-developed and well-nourished. No distress.  HENT:  Nose: Nose normal.  Mouth/Throat: Oropharynx is clear and moist.  Eyes: Right eye exhibits no discharge. Left eye exhibits no discharge. No scleral icterus.  Neck: Neck supple. No thyromegaly present.  Cardiovascular: Normal rate and regular rhythm.   Pulmonary/Chest: Breath sounds normal. No respiratory distress. She has no wheezes.  Breast exam reveals s/p partial mastectomy - right breast.  No discrete masses.  Left breast with implant.  No masses.   Abdominal: Soft. Bowel sounds are normal. There is no tenderness.  Musculoskeletal: She exhibits no edema or tenderness.  Lymphadenopathy:    She has no cervical adenopathy.  Neurological: She is alert and oriented to person, place, and time.  Skin: No rash noted. No erythema.  Psychiatric: She has a normal mood and affect. Her behavior is normal.    Lab Results  Component Value Date   HGBA1C 6.2* 09/02/2014   HGBA1C 6.6* 09/18/2012    Lab Results  Component Value Date   CREATININE 0.90 09/04/2014   CREATININE 0.90 09/03/2014   CREATININE 0.65 09/02/2014    Lab Results  Component Value Date   WBC 8.4 09/02/2014   HGB 11.7* 09/02/2014   HCT 34.2* 09/02/2014   PLT 167 09/02/2014   GLUCOSE 91 09/04/2014   CHOL 260* 09/02/2014   TRIG 162* 09/02/2014   HDL 39* 09/02/2014   LDLDIRECT 205.3 09/12/2012   LDLCALC 189* 09/02/2014   ALT 18 07/08/2014   AST 20 07/08/2014    NA 140 09/04/2014   K 4.6 09/04/2014   CL 106 09/04/2014   CREATININE 0.90 09/04/2014   BUN 13 09/04/2014   CO2 20 09/04/2014   TSH 0.84 07/08/2014   HGBA1C 6.2* 09/02/2014     Assessment & Plan:   Problem List Items Addressed This Visit    Asthma    Continue inhalers.  Breathing stable.        Breast cancer of upper-outer quadrant of right female breast    Is s/p chemotherapy.  Completed XRT.  Continues to f/u with oncology, Dr Tamala Julian and Dr Donella Stade.       CAD (coronary artery disease) - Primary    Has known disease.  Had MI recently.  Is s/p RCA stent placement.  Has 50-70% mid LAD lesion.  Currently doing well.  Going to cardiac rehab.  On low  dose crestor.  Tolerating.  Continue risk factor modification.  Discussed smoking cessation.        Carotid artery stenosis    Moderate disease on left.  Continue risk factor modification.        GERD (gastroesophageal reflux disease)    On omeprazole.  Symptoms controlled.       Hypercholesterolemia    On crestor and tolerating.  Follow lipid panel and liver function tests.        Hypertension    On lisinopril.  Pressure doing well.  Follow metabolic panel.       Hypothyroidism    On thyroid replacement.  Follow tsh.       Multiple sclerosis    Stable.  Declines further w/up.       Tobacco abuse    Discussed the need to quit smoking.  She is trying to cut back.           I am having Ms. Kujala maintain her omeprazole, Cyanocobalamin (VITAMIN B 12 PO), cholecalciferol, Magnesium Oxide, VENTOLIN HFA, levothyroxine, latanoprost, aspirin, metoprolol tartrate, ticagrelor, ADVAIR DISKUS, and rosuvastatin.   Follow-up: Return in about 4 months (around 01/30/2015) for follow up appt (73mn).  I spent 25 minutes with the patient and more than 50% of the time was spent in consultation regarding the above.     SEinar Pheasant MD

## 2014-10-06 NOTE — Assessment & Plan Note (Signed)
Has known disease.  Had MI recently.  Is s/p RCA stent placement.  Has 50-70% mid LAD lesion.  Currently doing well.  Going to cardiac rehab.  On low dose crestor.  Tolerating.  Continue risk factor modification.  Discussed smoking cessation.

## 2014-10-06 NOTE — Progress Notes (Signed)
Daily Session Note  Patient Details  Name: FAITHLYNN DEELEY MRN: [id number] Date of Birth: [birth year] Referring Provider:  Einar Pheasant, MD  Encounter Date: 10/06/2014  Check In:     Session Check In - 10/06/14 0851    Check-In   Staff Present Earlean Shawl BS, ACSM CEP Exercise Physiologist;Renee Dillard Essex MS, ACSM CEP Exercise Physiologist;Susanne Bice RN, BSN, CCRP   ER physicians immediately available to respond to emergencies See telemetry face sheet for immediately available ER MD   Medication changes reported     No   Fall or balance concerns reported    No   Warm-up and Cool-down Performed on first and last piece of equipment   VAD Patient? No   Pain Assessment   Currently in Pain? No/denies   Multiple Pain Sites No         Goals Met:  Independence with exercise equipment Exercise tolerated well No report of cardiac concerns or symptoms  Goals Unmet:  Not Applicable  Goals Comments:    Dr. Emily Filbert is Medical Director for [facility].

## 2014-10-06 NOTE — Assessment & Plan Note (Signed)
Moderate disease on left.  Continue risk factor modification.

## 2014-10-06 NOTE — Assessment & Plan Note (Signed)
Discussed the need to quit smoking.  She is trying to cut back.

## 2014-10-06 NOTE — Assessment & Plan Note (Signed)
On thyroid replacement.  Follow tsh.  

## 2014-10-07 ENCOUNTER — Encounter: Payer: Self-pay | Admitting: *Deleted

## 2014-10-07 NOTE — Progress Notes (Signed)
Cardiac Individual Treatment Plan  Patient Details  Name: Haley Santos MRN: [id number] Date of Birth: May 18, 1945 Referring Provider:  Dr. Johnny Bridge  Initial Encounter Date:    09/22/2014 Diagnosis STEMI  Patient's Home Medications on Admission:  Current outpatient prescriptions:  .  ADVAIR DISKUS 250-50 MCG/DOSE AEPB, INHALE ONE DOSE BY MOUTH TWICE DAILY. RINSE MOUTH AFTER USE, Disp: 60 each, Rfl: 0 .  aspirin 81 MG chewable tablet, Chew 1 tablet (81 mg total) by mouth daily., Disp: , Rfl:  .  cholecalciferol (VITAMIN D) 1000 UNITS tablet, Take 2,000 Units by mouth daily., Disp: , Rfl:  .  Cyanocobalamin (VITAMIN B 12 PO), Take by mouth daily., Disp: , Rfl:  .  latanoprost (XALATAN) 0.005 % ophthalmic solution, Place 1-2 drops into both eyes daily., Disp: , Rfl:  .  levothyroxine (SYNTHROID, LEVOTHROID) 125 MCG tablet, Take 1 tablet (125 mcg total) by mouth daily., Disp: 90 tablet, Rfl: 1 .  Magnesium Oxide 400 MG CAPS, Take 400 mg by mouth daily., Disp: , Rfl:  .  metoprolol tartrate (LOPRESSOR) 25 MG tablet, Take 0.5 tablets (12.5 mg total) by mouth 2 (two) times daily., Disp: 60 tablet, Rfl: 11 .  omeprazole (PRILOSEC OTC) 20 MG tablet, Take 20 mg by mouth daily., Disp: , Rfl:  .  rosuvastatin (CRESTOR) 5 MG tablet, Take 5 mg by mouth 2 (two) times a week., Disp: , Rfl:  .  ticagrelor (BRILINTA) 90 MG TABS tablet, Take 1 tablet (90 mg total) by mouth 2 (two) times daily., Disp: 60 tablet, Rfl: 11 .  VENTOLIN HFA 108 (90 BASE) MCG/ACT inhaler, INHALE TWO PUFFS BY MOUTH EVERY 6 HOURS AS NEEDED FOR WHEEZING FOR SHORTNESS OF BREATH, Disp: 1 each, Rfl: 0  Past Medical History: Past Medical History  Diagnosis Date  . Asthma with bronchitis   . Multiple sclerosis   . Hyperthyroidism     a. s/p ablation maintained on synthroid  . GERD (gastroesophageal reflux disease)   . Glaucoma   . Hypercholesterolemia   . Breast cancer   . Crohn's disease   . Tobacco abuse   . Glucose  intolerance (impaired glucose tolerance)     a. HgA1c 6.2 during 08/2014 admission   . CAD (coronary artery disease)     a. 08/2014: inf STEMI s/p DES to RCA.     Tobacco Use: History  Smoking status  . Current Every Day Smoker -- 0.50 packs/day for 40 years  . Types: Cigarettes  Smokeless tobacco  . Never Used    Labs: Recent Review Flowsheet Data    Labs for ITP Cardiac and Pulmonary Rehab Latest Ref Rng 09/12/2012 09/18/2012 09/01/2014 09/02/2014   Cholestrol 0 - 200 mg/dL 248(H) - - 260(H)   LDLCALC 0 - 99 mg/dL - - - 189(H)   LDLDIRECT - 205.3 - - -   HDL >39 mg/dL 33.50(L) - - 39(L)   Trlycerides <150 mg/dL 156.0(H) - - 162(H)   Hemoglobin A1c 4.8 - 5.6 % - 6.6(H) - 6.2(H)   TCO2 0 - 100 mmol/L - - 18 -       Exercise Target Goals:    Exercise Program Goal: Individual exercise prescription set with THRR, safety & activity barriers. Participant demonstrates ability to understand and report RPE using BORG scale, to self-measure pulse accurately, and to acknowledge the importance of the exercise prescription.  Exercise Prescription Goal: Starting with aerobic activity 30 plus minutes a day, 3 days per week for initial exercise prescription. Provide home  exercise prescription and guidelines that participant acknowledges understanding prior to discharge.  Activity Barriers & Risk Stratification:     Activity Barriers & Risk Stratification - 09/22/14 1528    Activity Barriers & Risk Stratification   Activity Barriers Balance Concerns      6 Minute Walk:     6 Minute Walk      09/22/14 1500       6 Minute Walk   Phase Initial     Distance 1250 feet     Walk Time 6 minutes     Resting HR 82 bpm     Resting BP 138/70 mmHg     Max Ex. HR 102 bpm     Max Ex. BP 142/60 mmHg     RPE 12     Symptoms No        Initial Exercise Prescription:     Initial Exercise Prescription - 09/22/14 1500    Treadmill   MPH 2.2   Grade 0   Minutes 15   Bike   Level 0.4    Minutes 10   Recumbant Bike   Level 3   Watts 25   Minutes 15   NuStep   Level 3   Watts 40   Minutes 15   Arm Ergometer   Level 1   Watts 8   Minutes 10   Arm/Foot Ergometer   Level 4   Watts 12   Minutes 10   Cybex   Level 3   RPM 50   Minutes 10   Recumbant Elliptical   Level 1   RPM 40   Watts 10   Minutes 10   Elliptical   Level 1   Speed 3   Minutes 1   REL-XR   Level 3   Watts 40   Minutes 15   Prescription Details   Frequency (times per week) 3   Duration Progress to 30 minutes of continuous aerobic without signs/symptoms of physical distress   Intensity   THRR REST +  20   Ratings of Perceived Exertion 11-13   Progression Continue progressive overload as per policy without signs/symptoms or physical distress.   Resistance Training   Training Prescription Yes   Weight 2   Reps 10-12      Exercise Prescription Changes:     Exercise Prescription Changes      10/01/14 0800 10/07/14 0700         Exercise Review   Progression Yes Yes      Response to Exercise   Blood Pressure (Admit)  130/84 mmHg      Blood Pressure (Exercise)  130/70 mmHg      Blood Pressure (Exit)  110/70 mmHg      Heart Rate (Admit)  84 bpm      Heart Rate (Exercise)  102 bpm      Heart Rate (Exit)  82 bpm      Rating of Perceived Exertion (Exercise)  13      Duration  Progress to 30 minutes of continuous aerobic without signs/symptoms of physical distress      Intensity  THRR unchanged      Progression  Continue progressive overload as per policy without signs/symptoms or physical distress.      Resistance Training   Training Prescription  Yes      Weight  3      Reps  10-12      Interval Training   Interval Training  No  Treadmill   MPH 2.2 2.5      Grade 0 0      Minutes  20      NuStep   Level  6      Watts  60      Minutes  20         Discharge Exercise Prescription:   Nutrition:  Target Goals: Understanding of nutrition guidelines, daily intake  of sodium <1529m, cholesterol <2029m calories 30% from fat and 7% or less from saturated fats, daily to have 5 or more servings of fruits and vegetables.  Biometrics:     Pre Biometrics - 09/22/14 1504    Pre Biometrics   Height 5' 5.8" (1.671 m)   Weight 201 lb 12.8 oz (91.536 kg)   Waist Circumference 39 inches   Hip Circumference 46 inches   Waist to Hip Ratio 0.85 %   BMI (Calculated) 32.8       Nutrition Therapy Plan and Nutrition Goals:     Nutrition Therapy & Goals - 09/26/14 1030    Nutrition Therapy   Diet 1400 calorie, DASH diet principles   Fiber 20 grams   Whole Grain Foods 3 servings   Protein 6 ounces/day   Saturated Fats 10 max. grams   Fruits and Vegetables 5 servings/day   Personal Nutrition Goals   Personal Goal #1 Include minimum of 6 oz. of a protein food daily. Refer to list.   Personal Goal #2 Increase vegetable intake with a goal of 5 servings/day.   Personal Goal #3 Space 3 meals per day 4-5 hours apart.   Personal Goal #4 Read labels for saturated and trans fat and sodium   Intervention Plan   Intervention Using nutrition plan and personal goals to gain a healthy nutrition lifestyle. Add exercise as prescribed.      Nutrition Discharge: Rate Your Plate Scores:     Rate Your Plate - 0575/10/2518527  Rate Your Plate Scores   Pre Score 68   Pre Score % 78 %      Nutrition Goals Re-Evaluation:   Psychosocial: Target Goals: Acknowledge presence or absence of depression, maximize coping skills, provide positive support system. Participant is able to verbalize types and ability to use techniques and skills needed for reducing stress and depression.  Initial Review & Psychosocial Screening:     Initial Psych Review & Screening - 09/22/14 1533    Family Dynamics   Good Support System? Yes   Barriers   Psychosocial barriers to participate in program The patient should benefit from training in stress management and relaxation.  Wants to  quit smoking has tried everything in the past incl hypnosis, accupunture. Quit for 2 years until she got her dx of breast cancer.   Screening Interventions   Interventions Encouraged to exercise      Quality of Life Scores:     Quality of Life - 09/25/14 1149    Quality of Life Scores   Health/Function Pre 22.11 %   Socioeconomic Pre 28.07 %   Psych/Spiritual Pre 25.79 %   Family Pre 27.5 %   GLOBAL Pre 24.89 %      PHQ-9:     Recent Review Flowsheet Data    [facility] 09/29/2014 09/22/2014 09/26/2013   Decreased Interest 0 0 0   Down, Depressed, Hopeless 0 0 0   PHQ - 2 Score 0 0 0   Altered sleeping - 0 -   Tired, decreased energy -  1 -   Change in appetite - 0 -   Feeling bad or failure about yourself  - 0 -   Trouble concentrating - 0 -   Moving slowly or fidgety/restless - 0 -   Suicidal thoughts - 0 -   PHQ-9 Score - 1 -   Difficult doing work/chores - Not difficult at all -      Psychosocial Evaluation and Intervention:     Psychosocial Evaluation - 10/01/14 1056    Psychosocial Evaluation & Interventions   Interventions Stress management education;Encouraged to exercise with the program and follow exercise prescription;Relaxation education   Comments Counselor met with Ms. Sermons today for initial psychosocial evaluation.  She is a 69 year old well-adjusted independent female who lives alone but has a very strong support system with family close by and friends and active involvement in her faith community.  Ms. Delane Ginger had a heart attack one month ago and is in Cardiac Rehab as a result.  So far she loves this program and wants to increase her stamina, strength, energy and lose weight while here.  She denies a history of depression or current symptoms and reports she is in a positive mood most of the time, and especially after exercising here.  She has a plan to continue a program at [facility] following her completion of this program.  Ms. Delane Ginger will benefit from all  of the education here, including stress management and relaxation.  She has met with the dietician and has a plan to lose weight as a result.  Counselor will follow Ms. Delane Ginger as needed.     Continued Psychosocial Services Needed Yes  Attend and participate in all of the educational components of this program and exercise regularly.       Psychosocial Re-Evaluation:   Vocational Rehabilitation: Provide vocational rehab assistance to qualifying candidates.   Vocational Rehab Evaluation & Intervention:     Vocational Rehab - 09/22/14 1530    Initial Vocational Rehab Evaluation & Intervention   Assessment shows need for Vocational Rehabilitation No      Education: Education Goals: Education classes will be provided on a weekly basis, covering required topics. Participant will state understanding/return demonstration of topics presented.  Learning Barriers/Preferences:     Learning Barriers/Preferences - 09/22/14 1529    Learning Barriers/Preferences   Learning Barriers None   Learning Preferences None      Education Topics: General Nutrition Guidelines/Fats and Fiber: -Group instruction provided by verbal, written material, models and posters to present the general guidelines for heart healthy nutrition. Gives an explanation and review of dietary fats and fiber.   Controlling Sodium/Reading Food Labels: -Group verbal and written material supporting the discussion of sodium use in heart healthy nutrition. Review and explanation with models, verbal and written materials for utilization of the food label.   Exercise Physiology & Risk Factors: - Group verbal and written instruction with models to review the exercise physiology of the cardiovascular system and associated critical values. Details cardiovascular disease risk factors and the goals associated with each risk factor.   Aerobic Exercise & Resistance Training: - Gives group verbal and written discussion on the health  impact of inactivity. On the components of aerobic and resistive training programs and the benefits of this training and how to safely progress through these programs.   Flexibility, Balance, General Exercise Guidelines: - Provides group verbal and written instruction on the benefits of flexibility and balance training programs. Provides general exercise guidelines with specific guidelines to  those with heart or lung disease. Demonstration and skill practice provided.   Stress Management: - Provides group verbal and written instruction about the health risks of elevated stress, cause of high stress, and healthy ways to reduce stress.   Depression: - Provides group verbal and written instruction on the correlation between heart/lung disease and depressed mood, treatment options, and the stigmas associated with seeking treatment.   Anatomy & Physiology of the Heart: - Group verbal and written instruction and models provide basic cardiac anatomy and physiology, with the coronary electrical and arterial systems. Review of: AMI, Angina, Valve disease, Heart Failure, Cardiac Arrhythmia, Pacemakers, and the ICD.   Cardiac Procedures: - Group verbal and written instruction and models to describe the testing methods done to diagnose heart disease. Reviews the outcomes of the test results. Describes the treatment choices: Medical Management, Angioplasty, or Coronary Bypass Surgery.   Cardiac Medications: - Group verbal and written instruction to review commonly prescribed medications for heart disease. Reviews the medication, class of the drug, and side effects. Includes the steps to properly store meds and maintain the prescription regimen.   Go Sex-Intimacy & Heart Disease, Get SMART - Goal Setting: - Group verbal and written instruction through game format to discuss heart disease and the return to sexual intimacy. Provides group verbal and written material to discuss and apply goal setting  through the application of the S.M.A.R.T. Method.   Other Matters of the Heart: - Provides group verbal, written materials and models to describe Heart Failure, Angina, Valve Disease, and Diabetes in the realm of heart disease. Includes description of the disease process and treatment options available to the cardiac patient.   Exercise & Equipment Safety: - Individual verbal instruction and demonstration of equipment use and safety with use of the equipment.   Infection Prevention: - Provides verbal and written material to individual with discussion of infection control including proper hand washing and proper equipment cleaning during exercise session.   Falls Prevention: - Provides verbal and written material to individual with discussion of falls prevention and safety.   Diabetes: - Individual verbal and written instruction to review signs/symptoms of diabetes, desired ranges of glucose level fasting, after meals and with exercise. Advice that pre and post exercise glucose checks will be done for 3 sessions at entry of program.    Knowledge Questionnaire Score:     Knowledge Questionnaire Score - 09/22/14 1529    Knowledge Questionnaire Score   Pre Score 26      Personal Goals and Risk Factors at Admission:     Personal Goals and Risk Factors at Admission - 09/22/14 1532    Personal Goals and Risk Factors on Admission    Weight Management Yes   Intervention Learn and follow the exercise and diet guidelines while in the program. Utilize the nutrition and education classes to help gain knowledge of the diet and exercise expectations in the program   Increase Aerobic Exercise and Physical Activity Yes   Intervention While in program, learn and follow the exercise prescription taught. Start at a low level workload and increase workload after able to maintain previous level for 30 minutes. Increase time before increasing intensity.   Quit Smoking Yes   Number of packs per  day 1-1.5 packs cigarettes per day   Intervention Utilize your health care professional team to help with smoking cessation while in the program. Your doctor can prescribe medications to aid in cessation. The program can provide information and counseling as needed.  Diabetes No   Hypertension Yes  She reports white coat syndrome   Goal Participant will see blood pressure controlled within the values of 140/4m/Hg or within value directed by their physician.   Intervention Provide nutrition & aerobic exercise along with prescribed medications to achieve BP 140/90 or less.   Lipids Yes   Goal Cholesterol controlled with medications as prescribed, with individualized exercise RX and with personalized nutrition plan. Value goals: LDL < 766m HDL > 4043mParticipant states understanding of desired cholesterol values and following prescriptions.   Intervention Provide nutrition & aerobic exercise along with prescribed medications to achieve LDL <1m6mDL >40mg46m   Personal Goals and Risk Factors Review:    Personal Goals Discharge:     Comments: 30 day review

## 2014-10-08 DIAGNOSIS — I214 Non-ST elevation (NSTEMI) myocardial infarction: Secondary | ICD-10-CM

## 2014-10-08 NOTE — Progress Notes (Signed)
Daily Session Note  Patient Details  Name: Haley Santos MRN: [id number] Date of Birth: Jul 13, 1945 Referring Provider:  Einar Pheasant, MD  Encounter Date: 10/08/2014  Check In:     Session Check In - 10/08/14 0835    Check-In   Staff Present Heath Lark RN, BSN, CCRP;Lessa Huge BS, ACSM EP-C, Exercise Physiologist;Renee Dillard Essex MS, ACSM CEP Exercise Physiologist   ER physicians immediately available to respond to emergencies See telemetry face sheet for immediately available ER MD   Medication changes reported     No   Fall or balance concerns reported    No   Warm-up and Cool-down Performed on first and last piece of equipment   VAD Patient? No   Pain Assessment   Currently in Pain? No/denies         Goals Met:  Proper associated with RPD/PD & O2 Sat Exercise tolerated well No report of cardiac concerns or symptoms Strength training completed today  Goals Unmet:  Not Applicable  Goals Comments:    Dr. Emily Filbert is Medical Director for [place] [facility].

## 2014-10-09 ENCOUNTER — Other Ambulatory Visit: Payer: Self-pay | Admitting: *Deleted

## 2014-10-09 ENCOUNTER — Encounter: Payer: Self-pay | Admitting: Cardiovascular Disease

## 2014-10-09 ENCOUNTER — Other Ambulatory Visit: Payer: Self-pay | Admitting: Internal Medicine

## 2014-10-09 DIAGNOSIS — I214 Non-ST elevation (NSTEMI) myocardial infarction: Secondary | ICD-10-CM

## 2014-10-10 ENCOUNTER — Encounter: Payer: Medicare Other | Admitting: *Deleted

## 2014-10-10 DIAGNOSIS — I214 Non-ST elevation (NSTEMI) myocardial infarction: Secondary | ICD-10-CM

## 2014-10-10 NOTE — Progress Notes (Signed)
Daily Session Note  Patient Details  Name: TASHANNA DOLIN MRN: [id number] Date of Birth: Nov 20, 1945 Referring Provider:  Einar Pheasant, MD  Encounter Date: 10/10/2014  Check In:     Session Check In - 10/10/14 0917    Check-In   Staff Present Heath Lark RN, BSN, CCRP;Renee Dillard Essex MS, ACSM CEP Exercise Physiologist;Carroll Enterkin RN, BSN   ER physicians immediately available to respond to emergencies See telemetry face sheet for immediately available ER MD   Medication changes reported     No   Fall or balance concerns reported    No   Warm-up and Cool-down Performed on first and last piece of equipment   VAD Patient? No   Pain Assessment   Currently in Pain? No/denies         Goals Met:  Independence with exercise equipment Exercise tolerated well No report of cardiac concerns or symptoms Strength training completed today  Goals Unmet:  Not Applicable  Goals Comments:    Dr. Emily Filbert is Medical Director for [place] [facility].

## 2014-10-15 ENCOUNTER — Encounter: Payer: Medicare Other | Attending: Cardiovascular Disease

## 2014-10-15 DIAGNOSIS — I214 Non-ST elevation (NSTEMI) myocardial infarction: Secondary | ICD-10-CM | POA: Diagnosis not present

## 2014-10-15 NOTE — Progress Notes (Signed)
Daily Session Note  Patient Details  Name: Haley Santos MRN: [id number] Date of Birth: October 31, 1945 Referring Provider:  Einar Pheasant, MD  Encounter Date: 10/15/2014  Check In:     Session Check In - 10/15/14 0836    Check-In   Staff Present Heath Lark RN, BSN, CCRP;Dashea Mcmullan BS, ACSM EP-C, Exercise Physiologist;Renee Dillard Essex MS, ACSM CEP Exercise Physiologist   ER physicians immediately available to respond to emergencies See telemetry face sheet for immediately available ER MD   Medication changes reported     No   Fall or balance concerns reported    No   Warm-up and Cool-down Performed on first and last piece of equipment   VAD Patient? No   Pain Assessment   Currently in Pain? No/denies         Goals Met:  Proper associated with RPD/PD & O2 Sat Exercise tolerated well No report of cardiac concerns or symptoms Strength training completed today  Goals Unmet:  Not Applicable  Goals Comments:    Dr. Emily Filbert is Medical Director for [place] and [facility].

## 2014-10-27 ENCOUNTER — Other Ambulatory Visit: Payer: Self-pay | Admitting: Hematology and Oncology

## 2014-10-27 ENCOUNTER — Ambulatory Visit: Admission: RE | Admit: 2014-10-27 | Payer: Medicare Other | Source: Ambulatory Visit

## 2014-10-27 ENCOUNTER — Ambulatory Visit
Admission: RE | Admit: 2014-10-27 | Discharge: 2014-10-27 | Disposition: A | Discharge status: Home / Self Care | Payer: Medicare Other | Source: Ambulatory Visit | Attending: Hematology and Oncology | Admitting: Hematology and Oncology

## 2014-10-27 DIAGNOSIS — Z853 Personal history of malignant neoplasm of breast: Secondary | ICD-10-CM | POA: Diagnosis not present

## 2014-10-27 DIAGNOSIS — Z9882 Breast implant status: Secondary | ICD-10-CM | POA: Diagnosis not present

## 2014-10-27 DIAGNOSIS — Z9011 Acquired absence of right breast and nipple: Secondary | ICD-10-CM | POA: Diagnosis not present

## 2014-10-27 DIAGNOSIS — Z08 Encounter for follow-up examination after completed treatment for malignant neoplasm: Secondary | ICD-10-CM | POA: Diagnosis not present

## 2014-10-27 LAB — HM MAMMOGRAPHY: HM Mammogram: NORMAL

## 2014-10-30 ENCOUNTER — Encounter: Payer: Self-pay | Admitting: *Deleted

## 2014-10-30 DIAGNOSIS — I214 Non-ST elevation (NSTEMI) myocardial infarction: Secondary | ICD-10-CM

## 2014-10-30 NOTE — Progress Notes (Signed)
Cardiac Individual Treatment Plan  Patient Details  Name: Haley Santos MRN: [id number] Date of Birth: 01-03-1946 Referring Provider:  Dr. Johnny Bridge Initial Encounter Date:  09/22/2014  Visit Diagnosis: Acute non-ST-elevation myocardial infarction  Patient's Home Medications on Admission:  Current outpatient prescriptions:  .  ADVAIR DISKUS 250-50 MCG/DOSE AEPB, INHALE ONE PUFF BY MOUTH TWICE DAILY RINSE  MOUTH  AFTER  EACH  USE, Disp: 60 each, Rfl: 5 .  aspirin 81 MG chewable tablet, Chew 1 tablet (81 mg total) by mouth daily., Disp: , Rfl:  .  cholecalciferol (VITAMIN D) 1000 UNITS tablet, Take 2,000 Units by mouth daily., Disp: , Rfl:  .  Cyanocobalamin (VITAMIN B 12 PO), Take by mouth daily., Disp: , Rfl:  .  latanoprost (XALATAN) 0.005 % ophthalmic solution, Place 1-2 drops into both eyes daily., Disp: , Rfl:  .  levothyroxine (SYNTHROID, LEVOTHROID) 125 MCG tablet, Take 1 tablet (125 mcg total) by mouth daily., Disp: 90 tablet, Rfl: 1 .  Magnesium Oxide 400 MG CAPS, Take 400 mg by mouth daily., Disp: , Rfl:  .  metoprolol tartrate (LOPRESSOR) 25 MG tablet, Take 0.5 tablets (12.5 mg total) by mouth 2 (two) times daily., Disp: 60 tablet, Rfl: 11 .  omeprazole (PRILOSEC OTC) 20 MG tablet, Take 20 mg by mouth daily., Disp: , Rfl:  .  rosuvastatin (CRESTOR) 5 MG tablet, Take 5 mg by mouth 2 (two) times a week., Disp: , Rfl:  .  ticagrelor (BRILINTA) 90 MG TABS tablet, Take 1 tablet (90 mg total) by mouth 2 (two) times daily., Disp: 60 tablet, Rfl: 11 .  VENTOLIN HFA 108 (90 BASE) MCG/ACT inhaler, INHALE TWO PUFFS BY MOUTH EVERY 6 HOURS AS NEEDED FOR WHEEZING FOR SHORTNESS OF BREATH, Disp: 33 each, Rfl: 0  Past Medical History: Past Medical History  Diagnosis Date  . Asthma with bronchitis   . Multiple sclerosis   . Hyperthyroidism     a. s/p ablation maintained on synthroid  . GERD (gastroesophageal reflux disease)   . Glaucoma   . Hypercholesterolemia   . Breast cancer   .  Crohn's disease   . Tobacco abuse   . Glucose intolerance (impaired glucose tolerance)     a. HgA1c 6.2 during 08/2014 admission   . CAD (coronary artery disease)     a. 08/2014: inf STEMI s/p DES to RCA.     Tobacco Use: History  Smoking status  . Current Every Day Smoker -- 0.50 packs/day for 40 years  . Types: Cigarettes  Smokeless tobacco  . Never Used    Labs: Recent Review Flowsheet Data    Labs for ITP Cardiac and Pulmonary Rehab Latest Ref Rng 09/12/2012 09/18/2012 09/01/2014 09/02/2014   Cholestrol 0 - 200 mg/dL 248(H) - - 260(H)   LDLCALC 0 - 99 mg/dL - - - 189(H)   LDLDIRECT - 205.3 - - -   HDL >39 mg/dL 33.50(L) - - 39(L)   Trlycerides <150 mg/dL 156.0(H) - - 162(H)   Hemoglobin A1c 4.8 - 5.6 % - 6.6(H) - 6.2(H)   TCO2 0 - 100 mmol/L - - 18 -       Exercise Target Goals:    Exercise Program Goal: Individual exercise prescription set with THRR, safety & activity barriers. Participant demonstrates ability to understand and report RPE using BORG scale, to self-measure pulse accurately, and to acknowledge the importance of the exercise prescription.  Exercise Prescription Goal: Starting with aerobic activity 30 plus minutes a day, 3 days per  week for initial exercise prescription. Provide home exercise prescription and guidelines that participant acknowledges understanding prior to discharge.  Activity Barriers & Risk Stratification:     Activity Barriers & Risk Stratification - 09/22/14 1528    Activity Barriers & Risk Stratification   Activity Barriers Balance Concerns      6 Minute Walk:     6 Minute Walk      09/22/14 1500       6 Minute Walk   Phase Initial     Distance 1250 feet     Walk Time 6 minutes     Resting HR 82 bpm     Resting BP 138/70 mmHg     Max Ex. HR 102 bpm     Max Ex. BP 142/60 mmHg     RPE 12     Symptoms No        Initial Exercise Prescription:     Initial Exercise Prescription - 09/22/14 1500    Treadmill   MPH 2.2    Grade 0   Minutes 15   Bike   Level 0.4   Minutes 10   Recumbant Bike   Level 3   Watts 25   Minutes 15   NuStep   Level 3   Watts 40   Minutes 15   Arm Ergometer   Level 1   Watts 8   Minutes 10   Arm/Foot Ergometer   Level 4   Watts 12   Minutes 10   Cybex   Level 3   RPM 50   Minutes 10   Recumbant Elliptical   Level 1   RPM 40   Watts 10   Minutes 10   Elliptical   Level 1   Speed 3   Minutes 1   REL-XR   Level 3   Watts 40   Minutes 15   Prescription Details   Frequency (times per week) 3   Duration Progress to 30 minutes of continuous aerobic without signs/symptoms of physical distress   Intensity   THRR REST +  20   Ratings of Perceived Exertion 11-13   Progression Continue progressive overload as per policy without signs/symptoms or physical distress.   Resistance Training   Training Prescription Yes   Weight 2   Reps 10-12      Exercise Prescription Changes:     Exercise Prescription Changes      10/01/14 0800 10/07/14 0700 10/15/14 0900       Exercise Review   Progression Yes Yes Yes     Response to Exercise   Blood Pressure (Admit)  130/84 mmHg      Blood Pressure (Exercise)  130/70 mmHg      Blood Pressure (Exit)  110/70 mmHg      Heart Rate (Admit)  84 bpm      Heart Rate (Exercise)  102 bpm      Heart Rate (Exit)  82 bpm      Rating of Perceived Exertion (Exercise)  13      Duration  Progress to 30 minutes of continuous aerobic without signs/symptoms of physical distress      Intensity  THRR unchanged      Progression  Continue progressive overload as per policy without signs/symptoms or physical distress.      Resistance Training   Training Prescription  Yes      Weight  3      Reps  10-12      Interval Training  Interval Training  No Yes     Equipment   NuStep;Treadmill     Treadmill   MPH 2.2 2.5      Grade 0 0      Minutes  20      NuStep   Level  6      Watts  60      Minutes  20         Discharge  Exercise Prescription:   Nutrition:  Target Goals: Understanding of nutrition guidelines, daily intake of sodium <1531m, cholesterol <2081m calories 30% from fat and 7% or less from saturated fats, daily to have 5 or more servings of fruits and vegetables.  Biometrics:     Pre Biometrics - 09/22/14 1504    Pre Biometrics   Height 5' 5.8" (1.671 m)   Weight 201 lb 12.8 oz (91.536 kg)   Waist Circumference 39 inches   Hip Circumference 46 inches   Waist to Hip Ratio 0.85 %   BMI (Calculated) 32.8       Nutrition Therapy Plan and Nutrition Goals:     Nutrition Therapy & Goals - 09/26/14 1030    Nutrition Therapy   Diet 1400 calorie, DASH diet principles   Fiber 20 grams   Whole Grain Foods 3 servings   Protein 6 ounces/day   Saturated Fats 10 max. grams   Fruits and Vegetables 5 servings/day   Personal Nutrition Goals   Personal Goal #1 Include minimum of 6 oz. of a protein food daily. Refer to list.   Personal Goal #2 Increase vegetable intake with a goal of 5 servings/day.   Personal Goal #3 Space 3 meals per day 4-5 hours apart.   Personal Goal #4 Read labels for saturated and trans fat and sodium   Intervention Plan   Intervention Using nutrition plan and personal goals to gain a healthy nutrition lifestyle. Add exercise as prescribed.      Nutrition Discharge: Rate Your Plate Scores:     Rate Your Plate - 0513/08/6517846  Rate Your Plate Scores   Pre Score 68   Pre Score % 78 %      Nutrition Goals Re-Evaluation:   Psychosocial: Target Goals: Acknowledge presence or absence of depression, maximize coping skills, provide positive support system. Participant is able to verbalize types and ability to use techniques and skills needed for reducing stress and depression.  Initial Review & Psychosocial Screening:     Initial Psych Review & Screening - 09/22/14 1533    Family Dynamics   Good Support System? Yes   Barriers   Psychosocial barriers to  participate in program The patient should benefit from training in stress management and relaxation.  Wants to quit smoking has tried everything in the past incl hypnosis, accupunture. Quit for 2 years until she got her dx of breast cancer.   Screening Interventions   Interventions Encouraged to exercise      Quality of Life Scores:     Quality of Life - 09/25/14 1149    Quality of Life Scores   Health/Function Pre 22.11 %   Socioeconomic Pre 28.07 %   Psych/Spiritual Pre 25.79 %   Family Pre 27.5 %   GLOBAL Pre 24.89 %      PHQ-9:     Recent Review Flowsheet Data    [facility] 09/29[facility]2014 09/22/2014 09/26/2013   Decreased Interest 0 0 0   Down, Depressed, Hopeless 0 0 0   PHQ -  2 Score 0 0 0   Altered sleeping - 0 -   Tired, decreased energy - 1 -   Change in appetite - 0 -   Feeling bad or failure about yourself  - 0 -   Trouble concentrating - 0 -   Moving slowly or fidgety/restless - 0 -   Suicidal thoughts - 0 -   PHQ-9 Score - 1 -   Difficult doing work/chores - Not difficult at all -      Psychosocial Evaluation and Intervention:     Psychosocial Evaluation - 10/01/14 1056    Psychosocial Evaluation & Interventions   Interventions Stress management education;Encouraged to exercise with the program and follow exercise prescription;Relaxation education   Comments Counselor met with Ms. Mauck today for initial psychosocial evaluation.  She is a 69 year old well-adjusted independent female who lives alone but has a very strong support system with family close by and friends and active involvement in her faith community.  Ms. Delane Ginger had a heart attack one month ago and is in Cardiac Rehab as a result.  So far she loves this program and wants to increase her stamina, strength, energy and lose weight while here.  She denies a history of depression or current symptoms and reports she is in a positive mood most of the time, and especially after exercising here.  She  has a plan to continue a program at [facility] following her completion of this program.  Ms. Delane Ginger will benefit from all of the education here, including stress management and relaxation.  She has met with the dietician and has a plan to lose weight as a result.  Counselor will follow Ms. Delane Ginger as needed.     Continued Psychosocial Services Needed Yes  Attend and participate in all of the educational components of this program and exercise regularly.       Psychosocial Re-Evaluation:   Vocational Rehabilitation: Provide vocational rehab assistance to qualifying candidates.   Vocational Rehab Evaluation & Intervention:     Vocational Rehab - 09/22/14 1530    Initial Vocational Rehab Evaluation & Intervention   Assessment shows need for Vocational Rehabilitation No      Education: Education Goals: Education classes will be provided on a weekly basis, covering required topics. Participant will state understanding/return demonstration of topics presented.  Learning Barriers/Preferences:     Learning Barriers/Preferences - 09/22/14 1529    Learning Barriers/Preferences   Learning Barriers None   Learning Preferences None      Education Topics: General Nutrition Guidelines/Fats and Fiber: -Group instruction provided by verbal, written material, models and posters to present the general guidelines for heart healthy nutrition. Gives an explanation and review of dietary fats and fiber.   Controlling Sodium/Reading Food Labels: -Group verbal and written material supporting the discussion of sodium use in heart healthy nutrition. Review and explanation with models, verbal and written materials for utilization of the food label.   Exercise Physiology & Risk Factors: - Group verbal and written instruction with models to review the exercise physiology of the cardiovascular system and associated critical values. Details cardiovascular disease risk factors and the goals associated with each risk  factor.   Aerobic Exercise & Resistance Training: - Gives group verbal and written discussion on the health impact of inactivity. On the components of aerobic and resistive training programs and the benefits of this training and how to safely progress through these programs.   Flexibility, Balance, General Exercise Guidelines: - Provides group verbal and written  instruction on the benefits of flexibility and balance training programs. Provides general exercise guidelines with specific guidelines to those with heart or lung disease. Demonstration and skill practice provided.   Stress Management: - Provides group verbal and written instruction about the health risks of elevated stress, cause of high stress, and healthy ways to reduce stress.   Depression: - Provides group verbal and written instruction on the correlation between heart/lung disease and depressed mood, treatment options, and the stigmas associated with seeking treatment.   Anatomy & Physiology of the Heart: - Group verbal and written instruction and models provide basic cardiac anatomy and physiology, with the coronary electrical and arterial systems. Review of: AMI, Angina, Valve disease, Heart Failure, Cardiac Arrhythmia, Pacemakers, and the ICD.   Cardiac Procedures: - Group verbal and written instruction and models to describe the testing methods done to diagnose heart disease. Reviews the outcomes of the test results. Describes the treatment choices: Medical Management, Angioplasty, or Coronary Bypass Surgery.   Cardiac Medications: - Group verbal and written instruction to review commonly prescribed medications for heart disease. Reviews the medication, class of the drug, and side effects. Includes the steps to properly store meds and maintain the prescription regimen.   Go Sex-Intimacy & Heart Disease, Get SMART - Goal Setting: - Group verbal and written instruction through game format to discuss heart disease and  the return to sexual intimacy. Provides group verbal and written material to discuss and apply goal setting through the application of the S.M.A.R.T. Method.   Other Matters of the Heart: - Provides group verbal, written materials and models to describe Heart Failure, Angina, Valve Disease, and Diabetes in the realm of heart disease. Includes description of the disease process and treatment options available to the cardiac patient.   Exercise & Equipment Safety: - Individual verbal instruction and demonstration of equipment use and safety with use of the equipment.   Infection Prevention: - Provides verbal and written material to individual with discussion of infection control including proper hand washing and proper equipment cleaning during exercise session.   Falls Prevention: - Provides verbal and written material to individual with discussion of falls prevention and safety.   Diabetes: - Individual verbal and written instruction to review signs/symptoms of diabetes, desired ranges of glucose level fasting, after meals and with exercise. Advice that pre and post exercise glucose checks will be done for 3 sessions at entry of program.    Knowledge Questionnaire Score:     Knowledge Questionnaire Score - 09/22/14 1529    Knowledge Questionnaire Score   Pre Score 26      Personal Goals and Risk Factors at Admission:     Personal Goals and Risk Factors at Admission - 09/22/14 1532    Personal Goals and Risk Factors on Admission    Weight Management Yes   Intervention Learn and follow the exercise and diet guidelines while in the program. Utilize the nutrition and education classes to help gain knowledge of the diet and exercise expectations in the program   Increase Aerobic Exercise and Physical Activity Yes   Intervention While in program, learn and follow the exercise prescription taught. Start at a low level workload and increase workload after able to maintain previous  level for 30 minutes. Increase time before increasing intensity.   Quit Smoking Yes   Number of packs per day 1-1.5 packs cigarettes per day   Intervention Utilize your health care professional team to help with smoking cessation while in the program. Your  doctor can prescribe medications to aid in cessation. The program can provide information and counseling as needed.   Diabetes No   Hypertension Yes  She reports white coat syndrome   Goal Participant will see blood pressure controlled within the values of 140/90m/Hg or within value directed by their physician.   Intervention Provide nutrition & aerobic exercise along with prescribed medications to achieve BP 140/90 or less.   Lipids Yes   Goal Cholesterol controlled with medications as prescribed, with individualized exercise RX and with personalized nutrition plan. Value goals: LDL < 741m HDL > 402mParticipant states understanding of desired cholesterol values and following prescriptions.   Intervention Provide nutrition & aerobic exercise along with prescribed medications to achieve LDL <64m58mDL >40mg36m   Personal Goals and Risk Factors Review:    Personal Goals Discharge:     Comments: Discharged from program per request. Attended 14/36 sessions

## 2014-10-30 NOTE — Progress Notes (Signed)
Discharge Summary  Patient Details  Name: Haley Santos MRN: [id number] Date of Birth: 1945-09-12 Referring Provider: Dr Johnny Bridge Number of Visits: 14  Reason for Discharge:  Early Exit:  Personal  Smoking History:  History  Smoking status  . Current Every Day Smoker -- 0.50 packs/day for 40 years  . Types: Cigarettes  Smokeless tobacco  . Never Used    Diagnosis:  Acute non-ST-elevation myocardial infarction - Plan: CARDIAC REHAB 30 DAY REVIEW  ADL [facility]:   Initial Exercise Prescription:     Initial Exercise Prescription - 09/22/14 1500    Treadmill   MPH 2.2   Grade 0   Minutes 15   Bike   Level 0.4   Minutes 10   Recumbant Bike   Level 3   Watts 25   Minutes 15   NuStep   Level 3   Watts 40   Minutes 15   Arm Ergometer   Level 1   Watts 8   Minutes 10   Arm/Foot Ergometer   Level 4   Watts 12   Minutes 10   Cybex   Level 3   RPM 50   Minutes 10   Recumbant Elliptical   Level 1   RPM 40   Watts 10   Minutes 10   Elliptical   Level 1   Speed 3   Minutes 1   REL-XR   Level 3   Watts 40   Minutes 15   Prescription Details   Frequency (times per week) 3   Duration Progress to 30 minutes of continuous aerobic without signs/symptoms of physical distress   Intensity   THRR REST +  20   Ratings of Perceived Exertion 11-13   Progression Continue progressive overload as per policy without signs/symptoms or physical distress.   Resistance Training   Training Prescription Yes   Weight 2   Reps 10-12      Discharge Exercise Prescription:   Functional Capacity:     6 Minute Walk      09/22/14 1500       6 Minute Walk   Phase Initial     Distance 1250 feet     Walk Time 6 minutes     Resting HR 82 bpm     Resting BP 138/70 mmHg     Max Ex. HR 102 bpm     Max Ex. BP 142/60 mmHg     RPE 12     Symptoms No        Psychological, QOL, Others - Outcomes: PHQ 2/9: Depression screen [facility] 2/9 09/29/2014 09/22/2014 09/26/2013   Decreased Interest 0 0 0  Down, Depressed, Hopeless 0 0 0  PHQ - 2 Score 0 0 0  Altered sleeping - 0 -  Tired, decreased energy - 1 -  Change in appetite - 0 -  Feeling bad or failure about yourself  - 0 -  Trouble concentrating - 0 -  Moving slowly or fidgety/restless - 0 -  Suicidal thoughts - 0 -  PHQ-9 Score - 1 -  Difficult doing work/chores - Not difficult at all -    Quality of Life:     Quality of Life - 09/25/14 1149    Quality of Life Scores   Health/Function Pre 22.11 %   Socioeconomic Pre 28.07 %   Psych/Spiritual Pre 25.79 %   Family Pre 27.5 %   GLOBAL Pre 24.89 %      Personal Goals: Goals established  at orientation with interventions provided to work toward goal.     Personal Goals and Risk Factors at Admission - 09/22/14 1532    Personal Goals and Risk Factors on Admission    Weight Management Yes   Intervention Learn and follow the exercise and diet guidelines while in the program. Utilize the nutrition and education classes to help gain knowledge of the diet and exercise expectations in the program   Increase Aerobic Exercise and Physical Activity Yes   Intervention While in program, learn and follow the exercise prescription taught. Start at a low level workload and increase workload after able to maintain previous level for 30 minutes. Increase time before increasing intensity.   Quit Smoking Yes   Number of packs per day 1-1.5 packs cigarettes per day   Intervention Utilize your health care professional team to help with smoking cessation while in the program. Your doctor can prescribe medications to aid in cessation. The program can provide information and counseling as needed.   Diabetes No   Hypertension Yes  She reports white coat syndrome   Goal Participant will see blood pressure controlled within the values of 140/4m/Hg or within value directed by their physician.   Intervention Provide nutrition & aerobic exercise along with prescribed  medications to achieve BP 140/90 or less.   Lipids Yes   Goal Cholesterol controlled with medications as prescribed, with individualized exercise RX and with personalized nutrition plan. Value goals: LDL < 764m HDL > 405mParticipant states understanding of desired cholesterol values and following prescriptions.   Intervention Provide nutrition & aerobic exercise along with prescribed medications to achieve LDL <64m74mDL >40mg2m    Personal Goals Discharge:   Nutrition & Weight - Outcomes:     Pre Biometrics - 09/22/14 1504    Pre Biometrics   Height 5' 5.8" (1.671 m)   Weight 201 lb 12.8 oz (91.536 kg)   Waist Circumference 39 inches   Hip Circumference 46 inches   Waist to Hip Ratio 0.85 %   BMI (Calculated) 32.8       Nutrition:     Nutrition Therapy & Goals - 09/26/14 1030    Nutrition Therapy   Diet 1400 calorie, DASH diet principles   Fiber 20 grams   Whole Grain Foods 3 servings   Protein 6 ounces/day   Saturated Fats 10 max. grams   Fruits and Vegetables 5 servings/day   Personal Nutrition Goals   Personal Goal #1 Include minimum of 6 oz. of a protein food daily. Refer to list.   Personal Goal #2 Increase vegetable intake with a goal of 5 servings/day.   Personal Goal #3 Space 3 meals per day 4-5 hours apart.   Personal Goal #4 Read labels for saturated and trans fat and sodium   Intervention Plan   Intervention Using nutrition plan and personal goals to gain a healthy nutrition lifestyle. Add exercise as prescribed.      Nutrition Discharge:     Rate Your Plate - 09/1668/01/74 9449ate Your Plate Scores   Pre Score 68   Pre Score % 78 %      Education Questionnaire Score:     Knowledge Questionnaire Score - 09/22/14 1529    Knowledge Questionnaire Score   Pre Score 26      Requested to be discharges. Attended 14/36 sessions.

## 2014-11-05 ENCOUNTER — Encounter: Payer: Self-pay | Admitting: Cardiovascular Disease

## 2014-11-06 ENCOUNTER — Encounter: Payer: Self-pay | Admitting: *Deleted

## 2014-11-06 DIAGNOSIS — I6529 Occlusion and stenosis of unspecified carotid artery: Secondary | ICD-10-CM | POA: Diagnosis not present

## 2014-11-06 DIAGNOSIS — E785 Hyperlipidemia, unspecified: Secondary | ICD-10-CM | POA: Diagnosis not present

## 2014-11-06 DIAGNOSIS — I214 Non-ST elevation (NSTEMI) myocardial infarction: Secondary | ICD-10-CM

## 2014-11-06 DIAGNOSIS — I2510 Atherosclerotic heart disease of native coronary artery without angina pectoris: Secondary | ICD-10-CM | POA: Diagnosis not present

## 2014-11-06 DIAGNOSIS — E669 Obesity, unspecified: Secondary | ICD-10-CM | POA: Diagnosis not present

## 2014-11-06 DIAGNOSIS — I10 Essential (primary) hypertension: Secondary | ICD-10-CM | POA: Diagnosis not present

## 2014-11-06 NOTE — Progress Notes (Signed)
Discharge Summary  Patient Details  Name: Haley Santos MRN: [id number] Date of Birth: 12-Jan-1946 Referring Provider:  Dr. Johnny Bridge     Number of Visits: 14/36  Reason for Discharge:  Early Exit:  Personal  Smoking History:  History  Smoking status  . Current Every Day Smoker -- 0.50 packs/day for 40 years  . Types: Cigarettes  Smokeless tobacco  . Never Used    Diagnosis:  Acute non-ST-elevation myocardial infarction  ADL [facility]:   Initial Exercise Prescription:     Initial Exercise Prescription - 09/22/14 1500    Treadmill   MPH 2.2   Grade 0   Minutes 15   Bike   Level 0.4   Minutes 10   Recumbant Bike   Level 3   Watts 25   Minutes 15   NuStep   Level 3   Watts 40   Minutes 15   Arm Ergometer   Level 1   Watts 8   Minutes 10   Arm/Foot Ergometer   Level 4   Watts 12   Minutes 10   Cybex   Level 3   RPM 50   Minutes 10   Recumbant Elliptical   Level 1   RPM 40   Watts 10   Minutes 10   Elliptical   Level 1   Speed 3   Minutes 1   REL-XR   Level 3   Watts 40   Minutes 15   Prescription Details   Frequency (times per week) 3   Duration Progress to 30 minutes of continuous aerobic without signs/symptoms of physical distress   Intensity   THRR REST +  20   Ratings of Perceived Exertion 11-13   Progression Continue progressive overload as per policy without signs/symptoms or physical distress.   Resistance Training   Training Prescription Yes   Weight 2   Reps 10-12      Discharge Exercise Prescription (Final Exercise Prescription Changes):     Exercise Prescription Changes - 10/15/14 0900    Exercise Review   Progression Yes   Interval Training   Interval Training Yes   Equipment NuStep;Treadmill      Functional Capacity:     6 Minute Walk      09/22/14 1500       6 Minute Walk   Phase Initial     Distance 1250 feet     Walk Time 6 minutes     Resting HR 82 bpm     Resting BP 138/70 mmHg     Max Ex. HR 102  bpm     Max Ex. BP 142/60 mmHg     RPE 12     Symptoms No        Psychological, QOL, Others - Outcomes: PHQ 2/9: Depression screen [facility] 2/9 09/29/2014 09/22/2014 09/26/2013  Decreased Interest 0 0 0  Down, Depressed, Hopeless 0 0 0  PHQ - 2 Score 0 0 0  Altered sleeping - 0 -  Tired, decreased energy - 1 -  Change in appetite - 0 -  Feeling bad or failure about yourself  - 0 -  Trouble concentrating - 0 -  Moving slowly or fidgety/restless - 0 -  Suicidal thoughts - 0 -  PHQ-9 Score - 1 -  Difficult doing work/chores - Not difficult at all -    Quality of Life:     Quality of Life - 09/25/14 1149    Quality of Life Scores  Health/Function Pre 22.11 %   Socioeconomic Pre 28.07 %   Psych/Spiritual Pre 25.79 %   Family Pre 27.5 %   GLOBAL Pre 24.89 %      Personal Goals: Goals established at orientation with interventions provided to work toward goal.     Personal Goals and Risk Factors at Admission - 09/22/14 1532    Personal Goals and Risk Factors on Admission    Weight Management Yes   Intervention Learn and follow the exercise and diet guidelines while in the program. Utilize the nutrition and education classes to help gain knowledge of the diet and exercise expectations in the program   Increase Aerobic Exercise and Physical Activity Yes   Intervention While in program, learn and follow the exercise prescription taught. Start at a low level workload and increase workload after able to maintain previous level for 30 minutes. Increase time before increasing intensity.   Quit Smoking Yes   Number of packs per day 1-1.5 packs cigarettes per day   Intervention Utilize your health care professional team to help with smoking cessation while in the program. Your doctor can prescribe medications to aid in cessation. The program can provide information and counseling as needed.   Diabetes No   Hypertension Yes  She reports white coat syndrome   Goal Participant will see blood  pressure controlled within the values of 140/51mm/Hg or within value directed by their physician.   Intervention Provide nutrition & aerobic exercise along with prescribed medications to achieve BP 140/90 or less.   Lipids Yes   Goal Cholesterol controlled with medications as prescribed, with individualized exercise RX and with personalized nutrition plan. Value goals: LDL < 70mg , HDL > 40mg . Participant states understanding of desired cholesterol values and following prescriptions.   Intervention Provide nutrition & aerobic exercise along with prescribed medications to achieve LDL 70mg , HDL >40mg .       Personal Goals Discharge:   Nutrition & Weight - Outcomes:     Pre Biometrics - 09/22/14 1504    Pre Biometrics   Height 5' 5.8" (1.671 m)   Weight 201 lb 12.8 oz (91.536 kg)   Waist Circumference 39 inches   Hip Circumference 46 inches   Waist to Hip Ratio 0.85 %   BMI (Calculated) 32.8       Nutrition:     Nutrition Therapy & Goals - 09/26/14 1030    Nutrition Therapy   Diet 1400 calorie, DASH diet principles   Fiber 20 grams   Whole Grain Foods 3 servings   Protein 6 ounces/day   Saturated Fats 10 max. grams   Fruits and Vegetables 5 servings/day   Personal Nutrition Goals   Personal Goal #1 Include minimum of 6 oz. of a protein food daily. Refer to list.   Personal Goal #2 Increase vegetable intake with a goal of 5 servings/day.   Personal Goal #3 Space 3 meals per day 4-5 hours apart.   Personal Goal #4 Read labels for saturated and trans fat and sodium   Intervention Plan   Intervention Using nutrition plan and personal goals to gain a healthy nutrition lifestyle. Add exercise as prescribed.      Nutrition Discharge:     Rate Your Plate - 46/65/99 3570    Rate Your Plate Scores   Pre Score 68   Pre Score % 78 %      Education Questionnaire Score:     Knowledge Questionnaire Score - 09/22/14 1529    Knowledge Questionnaire Score  Pre Score 26       Haley Santos completed 14/36 visits and elected to discharge from program.

## 2014-11-11 ENCOUNTER — Encounter: Payer: Self-pay | Admitting: Internal Medicine

## 2014-11-14 DIAGNOSIS — H4010X0 Unspecified open-angle glaucoma, stage unspecified: Secondary | ICD-10-CM | POA: Diagnosis not present

## 2014-11-19 DIAGNOSIS — H2513 Age-related nuclear cataract, bilateral: Secondary | ICD-10-CM | POA: Diagnosis not present

## 2014-12-01 ENCOUNTER — Other Ambulatory Visit: Payer: Medicare Other

## 2014-12-01 ENCOUNTER — Ambulatory Visit: Payer: Medicare Other | Admitting: Hematology and Oncology

## 2014-12-05 ENCOUNTER — Encounter: Payer: Self-pay | Admitting: Internal Medicine

## 2014-12-05 DIAGNOSIS — E7800 Pure hypercholesterolemia, unspecified: Secondary | ICD-10-CM

## 2014-12-05 DIAGNOSIS — E039 Hypothyroidism, unspecified: Secondary | ICD-10-CM

## 2014-12-05 NOTE — Telephone Encounter (Signed)
Orders placed in system for cholesterol and thyroid.  Pt notified via my chart.

## 2014-12-08 ENCOUNTER — Other Ambulatory Visit: Payer: Self-pay

## 2014-12-08 DIAGNOSIS — C50411 Malignant neoplasm of upper-outer quadrant of right female breast: Secondary | ICD-10-CM

## 2014-12-09 ENCOUNTER — Inpatient Hospital Stay: Payer: Medicare Other | Attending: Hematology and Oncology | Admitting: Hematology and Oncology

## 2014-12-09 ENCOUNTER — Inpatient Hospital Stay: Payer: Medicare Other

## 2014-12-09 VITALS — BP 138/79 | HR 116 | Temp 97.5°F | Resp 20 | Ht 66.25 in | Wt 207.2 lb

## 2014-12-09 DIAGNOSIS — F17210 Nicotine dependence, cigarettes, uncomplicated: Secondary | ICD-10-CM | POA: Diagnosis not present

## 2014-12-09 DIAGNOSIS — K5090 Crohn's disease, unspecified, without complications: Secondary | ICD-10-CM

## 2014-12-09 DIAGNOSIS — Z9011 Acquired absence of right breast and nipple: Secondary | ICD-10-CM

## 2014-12-09 DIAGNOSIS — J45909 Unspecified asthma, uncomplicated: Secondary | ICD-10-CM | POA: Insufficient documentation

## 2014-12-09 DIAGNOSIS — Z853 Personal history of malignant neoplasm of breast: Secondary | ICD-10-CM | POA: Diagnosis not present

## 2014-12-09 DIAGNOSIS — I2510 Atherosclerotic heart disease of native coronary artery without angina pectoris: Secondary | ICD-10-CM | POA: Insufficient documentation

## 2014-12-09 DIAGNOSIS — Z7982 Long term (current) use of aspirin: Secondary | ICD-10-CM | POA: Insufficient documentation

## 2014-12-09 DIAGNOSIS — Z9221 Personal history of antineoplastic chemotherapy: Secondary | ICD-10-CM | POA: Diagnosis not present

## 2014-12-09 DIAGNOSIS — Z923 Personal history of irradiation: Secondary | ICD-10-CM | POA: Insufficient documentation

## 2014-12-09 DIAGNOSIS — E780 Pure hypercholesterolemia: Secondary | ICD-10-CM

## 2014-12-09 DIAGNOSIS — Z79899 Other long term (current) drug therapy: Secondary | ICD-10-CM | POA: Diagnosis not present

## 2014-12-09 DIAGNOSIS — G35 Multiple sclerosis: Secondary | ICD-10-CM | POA: Diagnosis not present

## 2014-12-09 DIAGNOSIS — C50411 Malignant neoplasm of upper-outer quadrant of right female breast: Secondary | ICD-10-CM

## 2014-12-09 DIAGNOSIS — I252 Old myocardial infarction: Secondary | ICD-10-CM

## 2014-12-09 DIAGNOSIS — E0590 Thyrotoxicosis, unspecified without thyrotoxic crisis or storm: Secondary | ICD-10-CM

## 2014-12-09 LAB — COMPREHENSIVE METABOLIC PANEL
ALT: 20 U/L (ref 14–54)
AST: 21 U/L (ref 15–41)
Albumin: 4.2 g/dL (ref 3.5–5.0)
Alkaline Phosphatase: 87 U/L (ref 38–126)
Anion gap: 8 (ref 5–15)
BUN: 17 mg/dL (ref 6–20)
CO2: 26 mmol/L (ref 22–32)
Calcium: 9.4 mg/dL (ref 8.9–10.3)
Chloride: 103 mmol/L (ref 101–111)
Creatinine, Ser: 0.92 mg/dL (ref 0.44–1.00)
GFR calc Af Amer: >60 mL/min (ref >60)
GFR calc non Af Amer: >60 mL/min (ref >60)
Glucose, Bld: 115 mg/dL — ABNORMAL HIGH (ref 65–99)
Potassium: 4.2 mmol/L (ref 3.5–5.1)
Sodium: 137 mmol/L (ref 135–145)
Total Bilirubin: 0.9 mg/dL (ref 0.3–1.2)
Total Protein: 7.4 g/dL (ref 6.5–8.1)

## 2014-12-09 LAB — CBC
HCT: 39.5 % (ref 35.0–47.0)
Hemoglobin: 13.3 g/dL (ref 12.0–16.0)
MCH: 28.3 pg (ref 26.0–34.0)
MCHC: 33.7 g/dL (ref 32.0–36.0)
MCV: 84 fL (ref 80.0–100.0)
Platelets: 207 10*3/uL (ref 150–440)
RBC: 4.7 MIL/uL (ref 3.80–5.20)
RDW: 14.2 % (ref 11.5–14.5)
WBC: 8 10*3/uL (ref 3.6–11.0)

## 2014-12-09 NOTE — Progress Notes (Signed)
[facility] day:  12/09/2014  Chief Complaint: Haley Santos is a 69 y.o. female with a history of stage IIA right breast cancer who is seen for 4 month assessment.  HPI: The patient was last seen in the medical oncology clinic on 07/31/2014.  At that time,  she was seen for initial assessment by me. She had a history of stage IIA right breast cancer status post neoadjuvant chemotherapy.  She received 4 cycles of adriamycin and Cytoxan followed by only 6 weeks of Taxol secondary to a progressive neuropathy.  She underwent partial mastectomy and sentinel lymph node biopsy.  Pathology revealed a 2.2 cm grade II invasive mammary carcinoma with lobular features.  Margins were negative.  One sentinel lymph node was negative.  She received radiation, but declined hormonal therapy.    She has a history of elevated CA27.29 (41.3 on 01/29/2014 and 40.4 on 07/31/2014). Mammogram and ultrasound on 10/23/2013 revealed no evidence of malignancy and a postsurgical hematoma at the 12 o'clock position of the right breast.  She has multiple sclerosis and Crohn's disease.  Last colonoscopy was 5 years ago.  At last visit, she denied any breast concerns.  Exam was unremarkable.  During the interim, she states that she has done well. She had [facility]I at [facility] in 08/2014. There was a 100% blockage in the right coronary artery. A stent was placed. She notes "some other blockages".  Bilateral mammogram on 10/27/2014 stable breast parenchyma and no findings worrisome for recurrent tumor.  Regarding her history of breast cancer, she is doing well. She denies any breast concerns.  Past Medical History  Diagnosis Date  . Asthma with bronchitis   . Multiple sclerosis   . Hyperthyroidism     a. s/p ablation maintained on synthroid  . GERD (gastroesophageal reflux disease)   . Glaucoma   . Hypercholesterolemia   . Breast cancer   . Crohn's disease   . Tobacco abuse   .  Glucose intolerance (impaired glucose tolerance)     a. HgA1c 6.2 during 08/2014 admission   . CAD (coronary artery disease)     a. 08/2014: inf STEMI s/p DES to RCA.     Past Surgical History  Procedure Laterality Date  . Tonsillectomy and adenoidectomy  1957  . Right oophorectomy  1979  . Colonoscopy  2010    Dr Vira Agar  . Breast enhancement surgery  [place]  . Portacath placement  11/07/2012  . Left heart catheterization with coronary angiogram N/A 09/01/2014    Procedure: LEFT HEART CATHETERIZATION WITH CORONARY ANGIOGRAM;  Surgeon: Peter M Martinique, MD;  Location: [facility] CATH LAB;  Service: Cardiovascular;  Laterality: N/A;  . Augmentation mammaplasty Left     Family History  Problem Relation Age of Onset  . Diabetes Mother   . Heart disease Mother     myocardial infarction  . Glaucoma Mother   . Graves' disease Mother   . Lung disease Father   . Heart disease      half sister  . Peripheral vascular disease      half sister  . Heart attack Mother 73    Social History:  reports that she has been smoking Cigarettes.  She has a 20 pack-year smoking history. She has never used smokeless tobacco. She reports that she drinks alcohol. She reports that she does not use illicit drugs.  She smokes 3/4 pack per day.  The patient is  alone today.  Allergies:  Allergies  Allergen Reactions  . Statins Other (See Comments)    Muscle weakness/pain. Trials of multiple agents.  . Clindamycin/Lincomycin   . Codeine   . Shellfish Allergy Nausea And Vomiting    Felt goofy headed  . Valium [Diazepam]     Current Medications: Current Outpatient Prescriptions  Medication Sig Dispense Refill  . ADVAIR DISKUS 250-50 MCG/DOSE AEPB INHALE ONE PUFF BY MOUTH TWICE DAILY RINSE  MOUTH  AFTER  EACH  USE 60 each 5  . aspirin 81 MG chewable tablet Chew 1 tablet (81 mg total) by mouth daily.    Marland Kitchen latanoprost (XALATAN) 0.005 % ophthalmic solution Place 1-2 drops into both eyes daily.     Marland Kitchen levothyroxine (SYNTHROID, LEVOTHROID) 125 MCG tablet Take 1 tablet (125 mcg total) by mouth daily. 90 tablet 1  . metoprolol tartrate (LOPRESSOR) 25 MG tablet Take 0.5 tablets (12.5 mg total) by mouth 2 (two) times daily. 60 tablet 11  . omeprazole (PRILOSEC OTC) 20 MG tablet Take 20 mg by mouth daily.    . rosuvastatin (CRESTOR) 5 MG tablet Take 5 mg by mouth 2 (two) times a week.    . ticagrelor (BRILINTA) 90 MG TABS tablet Take 1 tablet (90 mg total) by mouth 2 (two) times daily. 60 tablet 11  . VENTOLIN HFA 108 (90 BASE) MCG/ACT inhaler INHALE TWO PUFFS BY MOUTH EVERY 6 HOURS AS NEEDED FOR WHEEZING FOR SHORTNESS OF BREATH 54 each 0   No current facility-administered medications for this visit.    Review of Systems:  GENERAL:  Feels good. No fevers, sweats or weight loss. PERFORMANCE STATUS (ECOG):  1 HEENT:  Visual changes due to multiple sclerosis.  Glaucoma.  No runny nose, sore throat, mouth sores or tenderness. Lungs: No shortness of breath or cough.  No hemoptysis. Cardiac:  Interval MI.  No chest pain, palpitations, orthopnea, or PND. GI:  Crohn's disease with no current symptoms.  No nausea, vomiting, diarrhea, constipation, melena or hematochezia. GU:  No urgency, frequency, dysuria, or hematuria. Musculoskeletal:  No back pain.  No joint pain.  No muscle tenderness. Extremities:  No pain or swelling. Skin:  No rashes or skin changes. Neuro:  Multiple sclerosis.  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  No headache, balance or coordination issues. Endocrine:  No diabetes.  Thyroid disease on Synthroid.  No hot flashes or night sweats. Psych:  No mood changes, depression or anxiety. Pain:  No focal pain. Review of systems:  All other systems reviewed and found to be negative.  Physical Exam: Blood pressure 138/79, pulse 116, temperature 97.5 F (36.4 C), temperature source Oral, resp. rate 20, height 5' 6.25" (1.683 m), weight 207 lb 4 oz (94.008 kg), last menstrual period  06/15/1977. GENERAL:  Well developed, well nourished, sitting comfortably in the exam room in no acute distress. MENTAL STATUS:  Alert and oriented to person, place and time. HEAD:  Short gray hair.  Normocephalic, atraumatic, face symmetric, no Cushingoid features. EYES:  Brown eyes.  Arcus.  Pupils equal round and reactive to light and accomodation.  No conjunctivitis or scleral icterus. ENT:  Oropharynx clear without lesion.  Poor dentition.  Dentures.  Tongue normal. Mucous membranes moist.  RESPIRATORY:  Clear to auscultation without rales, wheezes or rhonchi. CARDIOVASCULAR:  Regular rate and rhythm without murmur, rub or gallop. BREAST:  Right breast with slightly inverted nipple.  Post-op and post radiation changes inferiorly.  No discrete masses, skin changes or nipple discharge.  Left  breast with implant.  No masses, skin changes or nipple discharge.  ABDOMEN:  Soft, non-tender, with active bowel sounds, and no hepatosplenomegaly.  No masses. SKIN:  Psoriasis.  No rashes, ulcers or lesions. EXTREMITIES: No edema, no skin discoloration or tenderness.  No palpable cords. LYMPH NODES: No palpable cervical, supraclavicular, axillary or inguinal adenopathy  NEUROLOGICAL: Unremarkable. PSYCH:  Appropriate.  Appointment on 12/09/2014  Component Date Value Ref Range Status  . WBC 12/09/2014 8.0  3.6 - 11.0 K/uL Final  . RBC 12/09/2014 4.70  3.80 - 5.20 MIL/uL Final  . Hemoglobin 12/09/2014 13.3  12.0 - 16.0 g/dL Final  . HCT 12/09/2014 39.5  35.0 - 47.0 % Final  . MCV 12/09/2014 84.0  80.0 - 100.0 fL Final  . MCH 12/09/2014 28.3  26.0 - 34.0 pg Final  . MCHC 12/09/2014 33.7  32.0 - 36.0 g/dL Final  . RDW 12/09/2014 14.2  11.5 - 14.5 % Final  . Platelets 12/09/2014 207  150 - 440 K/uL Final  . Sodium 12/09/2014 137  135 - 145 mmol/L Final  . Potassium 12/09/2014 4.2  3.5 - 5.1 mmol/L Final  . Chloride 12/09/2014 103  101 - 111 mmol/L Final  . CO2 12/09/2014 26  22 - 32 mmol/L Final  .  Glucose, Bld 12/09/2014 115* 65 - 99 mg/dL Final  . BUN 12/09/2014 17  6 - 20 mg/dL Final  . Creatinine, Ser 12/09/2014 0.92  0.44 - 1.00 mg/dL Final  . Calcium 12/09/2014 9.4  8.9 - 10.3 mg/dL Final  . Total Protein 12/09/2014 7.4  6.5 - 8.1 g/dL Final  . Albumin 12/09/2014 4.2  3.5 - 5.0 g/dL Final  . AST 12/09/2014 21  15 - 41 U/L Final  . ALT 12/09/2014 20  14 - 54 U/L Final  . Alkaline Phosphatase 12/09/2014 87  38 - 126 U/L Final  . Total Bilirubin 12/09/2014 0.9  0.3 - 1.2 mg/dL Final  . GFR calc non Af Amer 12/09/2014 >60  >60 mL/min Final  . GFR calc Af Amer 12/09/2014 >60  >60 mL/min Final   Comment: (NOTE) The eGFR has been calculated using the CKD EPI equation. This calculation has not been validated in all clinical situations. eGFR's persistently <60 mL/min signify possible Chronic Kidney Disease.   . Anion gap 12/09/2014 8  5 - 15 Final    Assessment:  ALEEHA BOLINE is a 69 y.o. female with a history of stage IIA right breast cancer status post neoadjuvant chemotherapy.  Mammogram and ultrasound on 09/20/2002 revealed a 3.2 cm solid lesion in the right breast.  Breast MRI revealed a 2.6 cm right breast lesion.  Right breast biopsy on 10/11/2012 revealed grade II invasive mammary carcinoma.  Tumor was ER/PR positive and Her2/neu negative.  She received 4 cycles of adriamycin and Cytoxan (AC) every 3 weeks (11/07/2102 - 01/10/2013) followed by only 6 weeks of Taxol (01/31/2013 - 03/06/2013) secondary to a progressive neuropathy.  She underwent partial mastectomy and sentinel lymph node biopsy on 04/05/2013.  Pathology revealed a 2.2 cm grade II invasive mammary carcinoma with lobular features.  Margins were negative.  One sentinel lymph node was negative.  She received radiation from 05/13/2013 - 07/05/2012.  She declined hormonal therapy.    She has had a history of elevated CA27.29.  Tumor marker has been decreasing and is now normal.  CA27.29 was 41.3 (0-38.6) on  01/29/2014), 40.4 on 07/31/2014, and 32.4 on 12/09/2014.    Mammogram and ultrasound on 10/23/2013 revealed  no evidence of malignancy and a postsurgical hematoma at the 12 o'clock position of the right breast.  Bilateral mammogram on 10/27/2014 was stable.  She had STEMI in 08/2014. There was a 100% blockage in the right coronary artery as well as "some other blockages".  She has multiple sclerosis and Crohn's disease.  She denies any bowel symptoms from her Crohn's disease.  Last colonoscopy was 5 years ago.  She denies any breast concerns.  Exam is stable.  Plan: 1. Labs today:  CBC with diff, CMP, CA27.29 2. Review interval mammogram-done. 3. Discuss tumor marker.  4. Discuss smoking cessation. 5. Port flush today and every 6-8 weeks. 6. RTC in 4 months for MD assess, labs (CBC with diff, CMP, CA27.29).   Lequita Asal, MD  12/09/2014, 11:03 AM

## 2014-12-10 LAB — CANCER ANTIGEN 27.29: CA 27.29: 32.4 U/mL (ref 0.0–38.6)

## 2015-01-30 ENCOUNTER — Encounter: Payer: Self-pay | Admitting: Internal Medicine

## 2015-01-30 ENCOUNTER — Ambulatory Visit (INDEPENDENT_AMBULATORY_CARE_PROVIDER_SITE_OTHER): Payer: Medicare Other | Admitting: Internal Medicine

## 2015-01-30 VITALS — BP 110/60 | HR 75 | Temp 98.3°F | Resp 18 | Ht 66.25 in | Wt 206.8 lb

## 2015-01-30 DIAGNOSIS — G35 Multiple sclerosis: Secondary | ICD-10-CM

## 2015-01-30 DIAGNOSIS — I6522 Occlusion and stenosis of left carotid artery: Secondary | ICD-10-CM | POA: Diagnosis not present

## 2015-01-30 DIAGNOSIS — K219 Gastro-esophageal reflux disease without esophagitis: Secondary | ICD-10-CM

## 2015-01-30 DIAGNOSIS — R739 Hyperglycemia, unspecified: Secondary | ICD-10-CM

## 2015-01-30 DIAGNOSIS — I2101 ST elevation (STEMI) myocardial infarction involving left main coronary artery: Secondary | ICD-10-CM

## 2015-01-30 DIAGNOSIS — I2510 Atherosclerotic heart disease of native coronary artery without angina pectoris: Secondary | ICD-10-CM

## 2015-01-30 DIAGNOSIS — C50411 Malignant neoplasm of upper-outer quadrant of right female breast: Secondary | ICD-10-CM

## 2015-01-30 DIAGNOSIS — R5383 Other fatigue: Secondary | ICD-10-CM

## 2015-01-30 DIAGNOSIS — Z720 Tobacco use: Secondary | ICD-10-CM

## 2015-01-30 DIAGNOSIS — E7800 Pure hypercholesterolemia, unspecified: Secondary | ICD-10-CM

## 2015-01-30 DIAGNOSIS — E038 Other specified hypothyroidism: Secondary | ICD-10-CM | POA: Diagnosis not present

## 2015-01-30 DIAGNOSIS — Z862 Personal history of diseases of the blood and blood-forming organs and certain disorders involving the immune mechanism: Secondary | ICD-10-CM

## 2015-01-30 NOTE — Progress Notes (Signed)
Patient ID: Haley Santos, female   DOB: [birth year], 69 y.o.   MRN: [id number]   Subjective:    Patient ID: Haley Santos, female    DOB: October 27, 1945, 69 y.o.   MRN: [id number]  HPI  Patient here for a scheduled follow up.  Here to f/u on her breast cancer, elevated cholesterol, CAD and fatigue.  She has f/u with cardiology 10-03/2015.  Will occasionally noticed some twinges in her chest.  No significant pain.  Breathing stable.  No increased cough and congestion.  No acid reflux reported.  Eating and drinking well.  No vomiting.  Bowels stable.  Had a long discussion with her reguarding f/u at the cancer center.  She declines further w/up there.  Desires not to have tumor markers followed.  Some minimal discomfort in her legs.  Taking crestor.     Past Medical History  Diagnosis Date  . Asthma with bronchitis   . Multiple sclerosis   . Hyperthyroidism     a. s/p ablation maintained on synthroid  . GERD (gastroesophageal reflux disease)   . Glaucoma   . Hypercholesterolemia   . Breast cancer   . Crohn's disease   . Tobacco abuse   . Glucose intolerance (impaired glucose tolerance)     a. HgA1c 6.2 during 08/2014 admission   . CAD (coronary artery disease)     a. 08/2014: inf STEMI s/p DES to RCA.    Past Surgical History  Procedure Laterality Date  . Tonsillectomy and adenoidectomy  1957  . Right oophorectomy  1979  . Colonoscopy  2010    Dr Vira Agar  . Breast enhancement surgery  [place]  . Portacath placement  11/07/2012  . Left heart catheterization with coronary angiogram N/A 09/01/2014    Procedure: LEFT HEART CATHETERIZATION WITH CORONARY ANGIOGRAM;  Surgeon: Peter M Martinique, MD;  Location: [facility] CATH LAB;  Service: Cardiovascular;  Laterality: N/A;  . Augmentation mammaplasty Left    Family History  Problem Relation Age of Onset  . Diabetes Mother   . Heart disease Mother     myocardial infarction  . Glaucoma Mother   . Graves' disease Mother   . Lung  disease Father   . Heart disease      half sister  . Peripheral vascular disease      half sister  . Heart attack Mother 71   Social History   Social History  . Marital Status: Divorced    Spouse Name: N/A  . Number of Children: 1  . Years of Education: N/A   Social History Main Topics  . Smoking status: Current Every Day Smoker -- 0.50 packs/day for 40 years    Types: Cigarettes  . Smokeless tobacco: Never Used  . Alcohol Use: 0.0 oz/week    0 Standard drinks or equivalent per week  . Drug Use: No  . Sexual Activity: Not Asked   Other Topics Concern  . None   Social History Narrative    Outpatient Encounter Prescriptions as of 01/30/2015  Medication Sig  . ADVAIR DISKUS 250-50 MCG/DOSE AEPB INHALE ONE PUFF BY MOUTH TWICE DAILY RINSE  MOUTH  AFTER  EACH  USE  . aspirin 81 MG chewable tablet Chew 1 tablet (81 mg total) by mouth daily.  Marland Kitchen latanoprost (XALATAN) 0.005 % ophthalmic solution Place 1-2 drops into both eyes daily.  Marland Kitchen levothyroxine (SYNTHROID, LEVOTHROID) 125 MCG tablet Take 1 tablet (125 mcg total) by mouth daily.  . metoprolol tartrate (  LOPRESSOR) 25 MG tablet Take 0.5 tablets (12.5 mg total) by mouth 2 (two) times daily.  Marland Kitchen omeprazole (PRILOSEC OTC) 20 MG tablet Take 20 mg by mouth daily.  . rosuvastatin (CRESTOR) 5 MG tablet Take 5 mg by mouth 2 (two) times a week.  . ticagrelor (BRILINTA) 90 MG TABS tablet Take 1 tablet (90 mg total) by mouth 2 (two) times daily.  . VENTOLIN HFA 108 (90 BASE) MCG/ACT inhaler INHALE TWO PUFFS BY MOUTH EVERY 6 HOURS AS NEEDED FOR WHEEZING FOR SHORTNESS OF BREATH   No facility-administered encounter medications on file as of 01/30/2015.    Review of Systems  Constitutional: Negative for appetite change and unexpected weight change.  HENT: Negative for congestion and sinus pressure.   Eyes: Negative for discharge and visual disturbance.  Respiratory: Negative for cough, chest tightness and shortness of breath.     Cardiovascular: Positive for chest pain (minimal "twinges" in her chest as outlined. ). Negative for palpitations and leg swelling.  Gastrointestinal: Negative for nausea, vomiting, abdominal pain and diarrhea.  Genitourinary: Negative for dysuria and difficulty urinating.  Musculoskeletal: Negative for back pain and joint swelling.  Skin: Negative for color change and rash.  Neurological: Negative for dizziness, light-headedness and headaches.  Hematological: Negative for adenopathy. Does not bruise/bleed easily.  Psychiatric/Behavioral: Negative for dysphoric mood and agitation.       Objective:     Blood pressure rechecked by me:  126/62  Physical Exam  Constitutional: She appears well-developed and well-nourished. No distress.  HENT:  Nose: Nose normal.  Mouth/Throat: Oropharynx is clear and moist.  Eyes: Conjunctivae are normal. Right eye exhibits no discharge. Left eye exhibits no discharge.  Neck: Neck supple. No thyromegaly present.  Cardiovascular: Normal rate and regular rhythm.   Pulmonary/Chest: Breath sounds normal. No respiratory distress. She has no wheezes.  Abdominal: Soft. Bowel sounds are normal. There is no tenderness.  Musculoskeletal: She exhibits no edema or tenderness.  Lymphadenopathy:    She has no cervical adenopathy.  Skin: No rash noted. No erythema.  Psychiatric: She has a normal mood and affect. Her behavior is normal.    BP 110/60 mmHg  Pulse 75  Temp(Src) 98.3 F (36.8 C) (Oral)  Resp 18  Ht 5' 6.25" (1.683 m)  Wt 206 lb 12 oz (93.781 kg)  BMI 33.11 kg/m2  SpO2 97%  LMP 06/15/1977 Wt Readings from Last 3 Encounters:  01/30/15 206 lb 12 oz (93.781 kg)  12/09/14 207 lb 4 oz (94.008 kg)  09/29/14 202 lb (91.627 kg)     Lab Results  Component Value Date   WBC 8.0 12/09/2014   HGB 13.3 12/09/2014   HCT 39.5 12/09/2014   PLT 207 12/09/2014   GLUCOSE 115* 12/09/2014   CHOL 260* 09/02/2014   TRIG 162* 09/02/2014   HDL 39* 09/02/2014    LDLDIRECT 205.3 09/12/2012   LDLCALC 189* 09/02/2014   ALT 20 12/09/2014   AST 21 12/09/2014   NA 137 12/09/2014   K 4.2 12/09/2014   CL 103 12/09/2014   CREATININE 0.92 12/09/2014   BUN 17 12/09/2014   CO2 26 12/09/2014   TSH 0.84 07/08/2014   HGBA1C 6.2* 09/02/2014    Mm Diag Breast W/implant Tomo Bilateral  10/27/2014   CLINICAL DATA:  History of milk malignant lumpectomy of the right breast in 2014. Followup evaluation.  EXAM: DIGITAL DIAGNOSTIC BILATERAL MAMMOGRAM WITH IMPLANTS, 3D TOMOSYNTHESIS WITH CAD  The patient has a left breast retroglandular implant. Standard and implant displaced  views were performed.  COMPARISON:  Previous examinations the most recent of which is dated 10/23/2013.  ACR Breast Density Category b: There are scattered areas of fibroglandular density.  FINDINGS: There are scarring changes located within the central/superior right breast related to the patient's lumpectomy. There is no specific evidence for recurrent tumor or developing malignancy within either breast.  Mammographic images were processed with CAD.  IMPRESSION: Stable breast parenchymal pattern. No findings worrisome for recurrent tumor or developing malignancy.  RECOMMENDATION: Bilateral diagnostic mammography in 1 year.  I have discussed the findings and recommendations with the patient. Results were also provided in writing at the conclusion of the visit. If applicable, a reminder letter will be sent to the patient regarding the next appointment.  BI-RADS CATEGORY  2: Benign.   Electronically Signed   By: Altamese Cabal M.D.   On: 10/27/2014 14:44       Assessment & Plan:   Problem List Items Addressed This Visit    Breast cancer of upper-outer quadrant of right female breast    Is s/p chemotherapy.  Completed XRT.  She informs me today that she does not want to f/u with oncology.  Desires no further f/u.  We discussed this at length.  She informs me that she does not want any further markers  checked.  States even if found something, would not want chemotherapy or other treatment.  States would only have solitary nodule removed with no further treatment.  Again clarified she desires no further f/u with oncology and desires not to have tumor markers followed.  Mammogram 10/27/14 - birads II.        CAD (coronary artery disease) - Primary    Had known disease. S/p MI.  S/p RCA stent placement.  Has 50-70% LAD lesion.  Followed by cardiology.  Was going to rehab.  Not exercising as much now.  Discussed the need to exercise regularly.  Continue risk factor modification.  Discussed the need to quit smoking.        Carotid artery stenosis    Followed by Dr Delana Meyer.        GERD (gastroesophageal reflux disease)    On omeprazole.  No upper symptoms.  Follow.        Hypercholesterolemia    On crestor.  Some minimal leg aching.  Wants to continue crestor.  Follow lipid panel and liver function tests.        Relevant Orders   Lipid panel   Hepatic function panel   Hypothyroidism    On thyroid replacement.  Follow tsh.       Multiple sclerosis    Stable.  Has declined further w/up.        Relevant Orders   Basic metabolic panel   Tobacco abuse    Discussed the need to quit smoking.  Not ready to quit.  Discussed screening CT.  She declines.         Other Visit Diagnoses    History of anemia        Relevant Orders    CBC with Differential/Platelet    Ferritin    Hyperglycemia        Relevant Orders    Hemoglobin A1c    Other fatigue        Relevant Orders    TSH      I spent 40 minutes with the patient and more than 50% of the time was spent in consultation regarding the above.     SCOTT,  Randell Patient, MD

## 2015-01-30 NOTE — Progress Notes (Signed)
Pre-visit discussion using our clinic review tool. No additional management support is needed unless otherwise documented below in the visit note.  

## 2015-01-31 ENCOUNTER — Encounter: Payer: Self-pay | Admitting: Internal Medicine

## 2015-01-31 NOTE — Assessment & Plan Note (Signed)
Stable.  Has declined further w/up.   

## 2015-01-31 NOTE — Assessment & Plan Note (Signed)
On crestor.  Some minimal leg aching.  Wants to continue crestor.  Follow lipid panel and liver function tests.

## 2015-01-31 NOTE — Assessment & Plan Note (Signed)
Discussed the need to quit smoking.  Not ready to quit.  Discussed screening CT.  She declines.

## 2015-01-31 NOTE — Assessment & Plan Note (Signed)
On thyroid replacement.  Follow tsh.  

## 2015-01-31 NOTE — Assessment & Plan Note (Signed)
On omeprazole.  No upper symptoms.  Follow.

## 2015-01-31 NOTE — Assessment & Plan Note (Signed)
Is s/p chemotherapy.  Completed XRT.  She informs me today that she does not want to f/u with oncology.  Desires no further f/u.  We discussed this at length.  She informs me that she does not want any further markers checked.  States even if found something, would not want chemotherapy or other treatment.  States would only have solitary nodule removed with no further treatment.  Again clarified she desires no further f/u with oncology and desires not to have tumor markers followed.  Mammogram 10/27/14 - birads II.

## 2015-01-31 NOTE — Assessment & Plan Note (Signed)
Had known disease. S/p MI.  S/p RCA stent placement.  Has 50-70% LAD lesion.  Followed by cardiology.  Was going to rehab.  Not exercising as much now.  Discussed the need to exercise regularly.  Continue risk factor modification.  Discussed the need to quit smoking.

## 2015-01-31 NOTE — Assessment & Plan Note (Signed)
Followed by Dr Delana Meyer.

## 2015-02-03 ENCOUNTER — Other Ambulatory Visit (INDEPENDENT_AMBULATORY_CARE_PROVIDER_SITE_OTHER): Payer: Medicare Other

## 2015-02-03 DIAGNOSIS — E7800 Pure hypercholesterolemia, unspecified: Secondary | ICD-10-CM

## 2015-02-03 DIAGNOSIS — R739 Hyperglycemia, unspecified: Secondary | ICD-10-CM

## 2015-02-03 DIAGNOSIS — R5383 Other fatigue: Secondary | ICD-10-CM | POA: Diagnosis not present

## 2015-02-03 DIAGNOSIS — G35 Multiple sclerosis: Secondary | ICD-10-CM | POA: Diagnosis not present

## 2015-02-03 DIAGNOSIS — Z862 Personal history of diseases of the blood and blood-forming organs and certain disorders involving the immune mechanism: Secondary | ICD-10-CM

## 2015-02-03 LAB — HEPATIC FUNCTION PANEL
ALK PHOS: 94 U/L (ref 39–117)
ALT: 18 U/L (ref 0–35)
AST: 17 U/L (ref 0–37)
Albumin: 4 g/dL (ref 3.5–5.2)
BILIRUBIN DIRECT: 0.1 mg/dL (ref 0.0–0.3)
BILIRUBIN TOTAL: 0.3 mg/dL (ref 0.2–1.2)
TOTAL PROTEIN: 6.6 g/dL (ref 6.0–8.3)

## 2015-02-03 LAB — CBC WITH DIFFERENTIAL/PLATELET
BASOS ABS: 0 10*3/uL (ref 0.0–0.1)
Basophils Relative: 0.2 % (ref 0.0–3.0)
EOS ABS: 0.3 10*3/uL (ref 0.0–0.7)
Eosinophils Relative: 4.6 % (ref 0.0–5.0)
HCT: 38 % (ref 36.0–46.0)
Hemoglobin: 12.8 g/dL (ref 12.0–15.0)
LYMPHS ABS: 1.1 10*3/uL (ref 0.7–4.0)
Lymphocytes Relative: 17.9 % (ref 12.0–46.0)
MCHC: 33.8 g/dL (ref 30.0–36.0)
MCV: 83.5 fl (ref 78.0–100.0)
Monocytes Absolute: 0.4 10*3/uL (ref 0.1–1.0)
Monocytes Relative: 6.3 % (ref 3.0–12.0)
NEUTROS ABS: 4.5 10*3/uL (ref 1.4–7.7)
NEUTROS PCT: 71 % (ref 43.0–77.0)
PLATELETS: 194 10*3/uL (ref 150.0–400.0)
RBC: 4.54 Mil/uL (ref 3.87–5.11)
RDW: 13.9 % (ref 11.5–15.5)
WBC: 6.4 10*3/uL (ref 4.0–10.5)

## 2015-02-03 LAB — TSH: TSH: 0.12 u[IU]/mL — ABNORMAL LOW (ref 0.35–4.50)

## 2015-02-03 LAB — FERRITIN: Ferritin: 22.6 ng/mL (ref 10.0–291.0)

## 2015-02-03 LAB — LIPID PANEL
CHOLESTEROL: 209 mg/dL — AB (ref 0–200)
HDL: 41.5 mg/dL (ref >39.00)
NONHDL: 167.5
TRIGLYCERIDES: 223 mg/dL — AB (ref 0.0–149.0)
Total CHOL/HDL Ratio: 5
VLDL: 44.6 mg/dL — ABNORMAL HIGH (ref 0.0–40.0)

## 2015-02-03 LAB — BASIC METABOLIC PANEL
BUN: 13 mg/dL (ref 6–23)
CHLORIDE: 105 meq/L (ref 96–112)
CO2: 28 mEq/L (ref 19–32)
CREATININE: 0.85 mg/dL (ref 0.40–1.20)
Calcium: 9.5 mg/dL (ref 8.4–10.5)
GFR: 70.87 mL/min (ref >60.00)
GLUCOSE: 119 mg/dL — AB (ref 70–99)
Potassium: 4.4 mEq/L (ref 3.5–5.1)
Sodium: 141 mEq/L (ref 135–145)

## 2015-02-03 LAB — LDL CHOLESTEROL, DIRECT: LDL DIRECT: 154 mg/dL

## 2015-02-03 LAB — HEMOGLOBIN A1C: HEMOGLOBIN A1C: 6.4 % (ref 4.6–6.5)

## 2015-02-04 ENCOUNTER — Other Ambulatory Visit: Payer: Self-pay | Admitting: *Deleted

## 2015-02-04 ENCOUNTER — Other Ambulatory Visit: Payer: Self-pay | Admitting: Internal Medicine

## 2015-02-04 DIAGNOSIS — E039 Hypothyroidism, unspecified: Secondary | ICD-10-CM

## 2015-02-04 MED ORDER — LEVOTHYROXINE SODIUM 112 MCG PO TABS: 112.0000 ug | ORAL_TABLET | Freq: Every day | ORAL | Status: DC

## 2015-02-04 NOTE — Progress Notes (Signed)
order placed for f/u tsh.

## 2015-03-18 ENCOUNTER — Ambulatory Visit (INDEPENDENT_AMBULATORY_CARE_PROVIDER_SITE_OTHER): Payer: Medicare Other | Admitting: Cardiovascular Disease

## 2015-03-18 ENCOUNTER — Encounter: Payer: Self-pay | Admitting: Cardiovascular Disease

## 2015-03-18 ENCOUNTER — Other Ambulatory Visit (INDEPENDENT_AMBULATORY_CARE_PROVIDER_SITE_OTHER): Payer: Medicare Other

## 2015-03-18 ENCOUNTER — Encounter: Payer: Self-pay | Admitting: Internal Medicine

## 2015-03-18 VITALS — BP 138/70 | HR 80 | Ht 66.5 in | Wt 210.0 lb

## 2015-03-18 DIAGNOSIS — I2510 Atherosclerotic heart disease of native coronary artery without angina pectoris: Secondary | ICD-10-CM | POA: Diagnosis not present

## 2015-03-18 DIAGNOSIS — I10 Essential (primary) hypertension: Secondary | ICD-10-CM

## 2015-03-18 DIAGNOSIS — E7800 Pure hypercholesterolemia, unspecified: Secondary | ICD-10-CM

## 2015-03-18 DIAGNOSIS — I2101 ST elevation (STEMI) myocardial infarction involving left main coronary artery: Secondary | ICD-10-CM | POA: Diagnosis not present

## 2015-03-18 DIAGNOSIS — R000 Tachycardia, unspecified: Secondary | ICD-10-CM

## 2015-03-18 DIAGNOSIS — E039 Hypothyroidism, unspecified: Secondary | ICD-10-CM | POA: Diagnosis not present

## 2015-03-18 DIAGNOSIS — K50919 Crohn's disease, unspecified, with unspecified complications: Secondary | ICD-10-CM

## 2015-03-18 DIAGNOSIS — I2119 ST elevation (STEMI) myocardial infarction involving other coronary artery of inferior wall: Secondary | ICD-10-CM | POA: Diagnosis not present

## 2015-03-18 DIAGNOSIS — I6522 Occlusion and stenosis of left carotid artery: Secondary | ICD-10-CM

## 2015-03-18 LAB — LIPID PANEL
CHOLESTEROL: 209 mg/dL — AB (ref 0–200)
HDL: 45.2 mg/dL (ref >39.00)
NONHDL: 164.16
TRIGLYCERIDES: 202 mg/dL — AB (ref 0.0–149.0)
Total CHOL/HDL Ratio: 5
VLDL: 40.4 mg/dL — ABNORMAL HIGH (ref 0.0–40.0)

## 2015-03-18 LAB — LDL CHOLESTEROL, DIRECT: Direct LDL: 147 mg/dL

## 2015-03-18 LAB — TSH: TSH: 0.36 u[IU]/mL (ref 0.35–4.50)

## 2015-03-18 NOTE — Assessment & Plan Note (Signed)
She reports stable GI symptoms Does not want to start zetia out of concern of side effects

## 2015-03-18 NOTE — Assessment & Plan Note (Signed)
Cholesterol down to 209, goal less than 150 She does not want any other additional medications including one of the new her modalities through subcutaneous injection (PSK9 inh)

## 2015-03-18 NOTE — Assessment & Plan Note (Signed)
Currently with no symptoms of angina. Stress importance of smoking cessation, aggressive cholesterol management Atypical left-sided chest pain secondary to her report If symptoms get worse, recommended stress testing

## 2015-03-18 NOTE — Patient Instructions (Addendum)
You are doing well. No medication changes were made.  Please continue crestor 5 mg 3x /week  Please call if you would like to start the cholesterol shot  Smoking cessation!   Please call us if you have new issues that need to be addressed before your next appt.  Your physician wants you to follow-up in: 6 months.  You will receive a reminder letter in the mail two months in advance. If you don't receive a letter, please call our office to schedule the follow-up appointment.  Smoking Cessation, Tips for Success If you are ready to quit smoking, congratulations! You have chosen to help yourself be healthier. Cigarettes bring nicotine, tar, carbon monoxide, and other irritants into your body. Your lungs, heart, and blood vessels will be able to work better without these poisons. There are many different ways to quit smoking. Nicotine gum, nicotine patches, a nicotine inhaler, or nicotine nasal spray can help with physical craving. Hypnosis, support groups, and medicines help break the habit of smoking. WHAT THINGS CAN I DO TO MAKE QUITTING EASIER?  Here are some tips to help you quit for good:  Pick a date when you will quit smoking completely. Tell all of your friends and family about your plan to quit on that date.  Do not try to slowly cut down on the number of cigarettes you are smoking. Pick a quit date and quit smoking completely starting on that day.  Throw away all cigarettes.   Clean and remove all ashtrays from your home, work, and car.  On a card, write down your reasons for quitting. Carry the card with you and read it when you get the urge to smoke.  Cleanse your body of nicotine. Drink enough water and fluids to keep your urine clear or pale yellow. Do this after quitting to flush the nicotine from your body.  Learn to predict your moods. Do not let a bad situation be your excuse to have a cigarette. Some situations in your life might tempt you into wanting a  cigarette.  Never have "just one" cigarette. It leads to wanting another and another. Remind yourself of your decision to quit.  Change habits associated with smoking. If you smoked while driving or when feeling stressed, try other activities to replace smoking. Stand up when drinking your coffee. Brush your teeth after eating. Sit in a different chair when you read the paper. Avoid alcohol while trying to quit, and try to drink fewer caffeinated beverages. Alcohol and caffeine may urge you to smoke.  Avoid foods and drinks that can trigger a desire to smoke, such as sugary or spicy foods and alcohol.  Ask people who smoke not to smoke around you.  Have something planned to do right after eating or having a cup of coffee. For example, plan to take a walk or exercise.  Try a relaxation exercise to calm you down and decrease your stress. Remember, you may be tense and nervous for the first 2 weeks after you quit, but this will pass.  Find new activities to keep your hands busy. Play with a pen, coin, or rubber band. Doodle or draw things on paper.  Brush your teeth right after eating. This will help cut down on the craving for the taste of tobacco after meals. You can also try mouthwash.   Use oral substitutes in place of cigarettes. Try using lemon drops, carrots, cinnamon sticks, or chewing gum. Keep them handy so they are available when you have  the urge to smoke.  When you have the urge to smoke, try deep breathing.  Designate your home as a nonsmoking area.  If you are a heavy smoker, ask your health care provider about a prescription for nicotine chewing gum. It can ease your withdrawal from nicotine.  Reward yourself. Set aside the cigarette money you save and buy yourself something nice.  Look for support from others. Join a support group or smoking cessation program. Ask someone at home or at work to help you with your plan to quit smoking.  Always ask yourself, "Do I need this  cigarette or is this just a reflex?" Tell yourself, "Today, I choose not to smoke," or "I do not want to smoke." You are reminding yourself of your decision to quit.  Do not replace cigarette smoking with electronic cigarettes (commonly called e-cigarettes). The safety of e-cigarettes is unknown, and some may contain harmful chemicals.  If you relapse, do not give up! Plan ahead and think about what you will do the next time you get the urge to smoke. HOW WILL I FEEL WHEN I QUIT SMOKING? You may have symptoms of withdrawal because your body is used to nicotine (the addictive substance in cigarettes). You may crave cigarettes, be irritable, feel very hungry, cough often, get headaches, or have difficulty concentrating. The withdrawal symptoms are only temporary. They are strongest when you first quit but will go away within 10-14 days. When withdrawal symptoms occur, stay in control. Think about your reasons for quitting. Remind yourself that these are signs that your body is healing and getting used to being without cigarettes. Remember that withdrawal symptoms are easier to treat than the major diseases that smoking can cause.  Even after the withdrawal is over, expect periodic urges to smoke. However, these cravings are generally short lived and will go away whether you smoke or not. Do not smoke! WHAT RESOURCES ARE AVAILABLE TO HELP ME QUIT SMOKING? Your health care provider can direct you to community resources or hospitals for support, which may include:  Group support.  Education.  Hypnosis.  Therapy.   This information is not intended to replace advice given to you by your health care provider. Make sure you discuss any questions you have with your health care provider.   Document Released: 01/29/2004 Document Revised: 05/23/2014 Document Reviewed: 10/18/2012 Elsevier Interactive Patient Education [employer].

## 2015-03-18 NOTE — Assessment & Plan Note (Signed)
Carotid results discussed with her in detail. She was under the impression it was not important Repeat ultrasound next year

## 2015-03-18 NOTE — Progress Notes (Signed)
Patient ID: Haley Santos, female    DOB: Dec 26, 1945, 69 y.o.   MRN: [id number]  HPI Comments: [name] is a 69 yo woman with obesity, long history of smoking who continues to smoke, hyperlipidemia, intolerant of statins, GERD, hypertension with moderate carotid arterial disease on the left, who presents to establish care in the [place] office after recent STEMI. April 2016 she developed acute chest pain, bilateral arm pain, neck pain/jaw pain bilaterally.  She had cardiac catheterization, essentially occluded mid RCA with 50-70% mid LAD disease, DES placed the RCA. She presents today for follow-up of her coronary artery disease History of breast cancer, Adriamycin through port in the left chest Unable to remove the port as she is on blood thinners  She continues to smoke, trying to quit Tolerating Crestor 5 mill grams 3 days per week with improvement of her cholesterol from 260 down to 209 She does not want additional medications Concerned that Zetia may interfere with her Crohn's disease Was told in the past that her carotid blockage was not important as it only went to her face Explain to her that the 50-69% left internal carotid stenosis was important She has atypical chest pain which she feels is secondary to the port on the left upper chest  EKG on today's visit shows normal sinus rhythm with rate 80 bpm, no significant ST or T-wave changes   Other past medical history She has seen [facility] in the past and unable to tolerate statin secondary to myalgias. She reports a history of MS  Review of her carotid ultrasound with her shows 50-69% disease on the left Review of her lab work with her shows total cholesterol 260. Elevated hemoglobin A1c greater than 6    Allergies  Allergen Reactions  . Statins Other (See Comments)    Muscle weakness/pain. Trials of multiple agents.  . Clindamycin/Lincomycin   . Codeine   . Shellfish Allergy Nausea And Vomiting    Felt  goofy headed  . Valium [Diazepam]     Current Outpatient Prescriptions on File Prior to Visit  Medication Sig Dispense Refill  . ADVAIR DISKUS 250-50 MCG/DOSE AEPB INHALE ONE PUFF BY MOUTH TWICE DAILY RINSE  MOUTH  AFTER  EACH  USE 60 each 5  . aspirin 81 MG chewable tablet Chew 1 tablet (81 mg total) by mouth daily.    Marland Kitchen latanoprost (XALATAN) 0.005 % ophthalmic solution Place 1-2 drops into both eyes daily.    Marland Kitchen levothyroxine (SYNTHROID, LEVOTHROID) 112 MCG tablet Take 1 tablet (112 mcg total) by mouth daily. 90 tablet 0  . metoprolol tartrate (LOPRESSOR) 25 MG tablet Take 0.5 tablets (12.5 mg total) by mouth 2 (two) times daily. 60 tablet 11  . omeprazole (PRILOSEC OTC) 20 MG tablet Take 20 mg by mouth daily.    . rosuvastatin (CRESTOR) 5 MG tablet Take 5 mg by mouth 3 (three) times a week.     . ticagrelor (BRILINTA) 90 MG TABS tablet Take 1 tablet (90 mg total) by mouth 2 (two) times daily. 60 tablet 11  . VENTOLIN HFA 108 (90 BASE) MCG/ACT inhaler INHALE TWO PUFFS BY MOUTH EVERY 6 HOURS AS NEEDED FOR WHEEZING FOR SHORTNESS OF BREATH 54 each 0   No current facility-administered medications on file prior to visit.    Past Medical History  Diagnosis Date  . Asthma with bronchitis   . Multiple sclerosis ([place])   . Hyperthyroidism     a. s/p ablation maintained on synthroid  .  GERD (gastroesophageal reflux disease)   . Glaucoma   . Hypercholesterolemia   . Breast cancer ([place])   . Crohn's disease ([place])   . Tobacco abuse   . Glucose intolerance (impaired glucose tolerance)     a. HgA1c 6.2 during 08/2014 admission   . CAD (coronary artery disease)     a. 08/2014: inf STEMI s/p DES to RCA.     Past Surgical History  Procedure Laterality Date  . Tonsillectomy and adenoidectomy  1957  . Right oophorectomy  1979  . Colonoscopy  2010    Dr Vira Agar  . Breast enhancement surgery  [place]  . Portacath placement  11/07/2012  . Left heart catheterization with coronary  angiogram N/A 09/01/2014    Procedure: LEFT HEART CATHETERIZATION WITH CORONARY ANGIOGRAM;  Surgeon: Peter M Martinique, MD;  Location: [facility] CATH LAB;  Service: Cardiovascular;  Laterality: N/A;  . Augmentation mammaplasty Left     Social History  reports that she has been smoking Cigarettes.  She has a 20 pack-year smoking history. She has never used smokeless tobacco. She reports that she drinks alcohol. She reports that she does not use illicit drugs.  Family History family history includes Diabetes in her mother; Glaucoma in her mother; Berenice Primas' disease in her mother; Heart attack (age of onset: 39) in her mother; Heart disease in her mother and another family member; Lung disease in her father; Peripheral vascular disease in an other family member.  Lab Results  Component Value Date   CHOL 209* 02/03/2015   HDL 41.50 02/03/2015   LDLCALC 189* 09/02/2014   TRIG 223.0* 02/03/2015    Review of Systems  Constitutional: Negative.   Respiratory: Negative.   Cardiovascular: Negative.   Gastrointestinal: Negative.   Musculoskeletal: Negative.   Skin: Negative.   Neurological: Negative.   Hematological: Negative.   Psychiatric/Behavioral: Negative.   All other systems reviewed and are negative.   BP 138/70 mmHg  Pulse 80  Ht 5' 6.5" (1.689 m)  Wt 210 lb (95.255 kg)  BMI 33.39 kg/m2  LMP 06/15/1977  Physical Exam  Constitutional: She is oriented to person, place, and time. She appears well-developed and well-nourished.  HENT:  Head: Normocephalic.  Nose: Nose normal.  Mouth/Throat: Oropharynx is clear and moist.  Eyes: Conjunctivae are normal. Pupils are equal, round, and reactive to light.  Neck: Normal range of motion. Neck supple. No JVD present.  Cardiovascular: Normal rate, regular rhythm, S1 normal, S2 normal, normal heart sounds and intact distal pulses.  Exam reveals no gallop and no friction rub.   No murmur heard. Pulmonary/Chest: Effort normal and breath sounds normal.  No respiratory distress. She has no wheezes. She has no rales. She exhibits no tenderness.  Abdominal: Soft. Bowel sounds are normal. She exhibits no distension. There is no tenderness.  Musculoskeletal: Normal range of motion. She exhibits no edema or tenderness.  Lymphadenopathy:    She has no cervical adenopathy.  Neurological: She is alert and oriented to person, place, and time. Coordination normal.  Skin: Skin is warm and dry. No rash noted. No erythema.  Psychiatric: She has a normal mood and affect. Her behavior is normal. Judgment and thought content normal.    Assessment and Plan  Nursing note and vitals reviewed.

## 2015-03-18 NOTE — Assessment & Plan Note (Signed)
Blood pressure is well controlled on today's visit. No changes made to the medications. 

## 2015-03-20 NOTE — Telephone Encounter (Signed)
Unread mychart message mailed to patient 

## 2015-04-14 ENCOUNTER — Ambulatory Visit: Payer: Medicare Other | Admitting: Hematology and Oncology

## 2015-04-14 ENCOUNTER — Other Ambulatory Visit: Payer: Medicare Other

## 2015-04-16 ENCOUNTER — Other Ambulatory Visit: Payer: Self-pay | Admitting: Internal Medicine

## 2015-04-17 ENCOUNTER — Ambulatory Visit: Payer: Medicare Other | Admitting: Hematology and Oncology

## 2015-04-17 ENCOUNTER — Other Ambulatory Visit: Payer: Medicare Other

## 2015-04-29 ENCOUNTER — Telehealth: Payer: Self-pay | Admitting: *Deleted

## 2015-04-29 NOTE — Telephone Encounter (Signed)
Pt denies swelling, she states it looks like a lump or growth in the inner side of her wrist & now going up arm. Pt refused to go somewhere tonight & wants to know if you can work her or not. Please advise

## 2015-04-29 NOTE — Telephone Encounter (Signed)
Patient requested to be seen only by Dr. Nicki Reaper, due to her medical history . She's having left arm pain and swelling in her wrist. Please advise a place to schedule patient .

## 2015-04-29 NOTE — Telephone Encounter (Signed)
Let her know that I am not in the office this pm.  With her history, she needs to go ahead and have her arm looked at if her arm is swollen.  I would recommend [place] urgent [facility] (or acute care) if unable to go to [facility].  Need to confirm no clot.  They can do ultrasound if needed.

## 2015-04-30 NOTE — Telephone Encounter (Signed)
Left patient a message offering to see her today @ 4:30 (needs to arrive by 4:15).

## 2015-04-30 NOTE — Telephone Encounter (Signed)
The only place I have to put her today would be 1:15 today.  Work in for this problem.

## 2015-05-02 NOTE — Telephone Encounter (Signed)
Pt never returned my call

## 2015-05-07 ENCOUNTER — Encounter: Payer: Self-pay | Admitting: Internal Medicine

## 2015-05-07 ENCOUNTER — Ambulatory Visit (INDEPENDENT_AMBULATORY_CARE_PROVIDER_SITE_OTHER): Payer: Medicare Other | Admitting: Internal Medicine

## 2015-05-07 VITALS — BP 120/62 | HR 80 | Temp 98.0°F | Resp 18 | Ht 66.0 in | Wt 211.8 lb

## 2015-05-07 DIAGNOSIS — I2101 ST elevation (STEMI) myocardial infarction involving left main coronary artery: Secondary | ICD-10-CM

## 2015-05-07 DIAGNOSIS — I2510 Atherosclerotic heart disease of native coronary artery without angina pectoris: Secondary | ICD-10-CM | POA: Diagnosis not present

## 2015-05-07 DIAGNOSIS — M79662 Pain in left lower leg: Secondary | ICD-10-CM

## 2015-05-07 DIAGNOSIS — R739 Hyperglycemia, unspecified: Secondary | ICD-10-CM

## 2015-05-07 DIAGNOSIS — E038 Other specified hypothyroidism: Secondary | ICD-10-CM

## 2015-05-07 DIAGNOSIS — C50411 Malignant neoplasm of upper-outer quadrant of right female breast: Secondary | ICD-10-CM

## 2015-05-07 DIAGNOSIS — K219 Gastro-esophageal reflux disease without esophagitis: Secondary | ICD-10-CM

## 2015-05-07 DIAGNOSIS — I10 Essential (primary) hypertension: Secondary | ICD-10-CM | POA: Diagnosis not present

## 2015-05-07 DIAGNOSIS — M79602 Pain in left arm: Secondary | ICD-10-CM

## 2015-05-07 DIAGNOSIS — K50919 Crohn's disease, unspecified, with unspecified complications: Secondary | ICD-10-CM

## 2015-05-07 DIAGNOSIS — G35 Multiple sclerosis: Secondary | ICD-10-CM

## 2015-05-07 DIAGNOSIS — E7800 Pure hypercholesterolemia, unspecified: Secondary | ICD-10-CM

## 2015-05-07 DIAGNOSIS — I6522 Occlusion and stenosis of left carotid artery: Secondary | ICD-10-CM | POA: Diagnosis not present

## 2015-05-07 DIAGNOSIS — Z720 Tobacco use: Secondary | ICD-10-CM

## 2015-05-07 MED ORDER — LEVOTHYROXINE SODIUM 112 MCG PO TABS: 112.0000 ug | ORAL_TABLET | Freq: Every day | ORAL | Status: DC

## 2015-05-07 NOTE — Progress Notes (Signed)
Pre-visit discussion using our clinic review tool. No additional management support is needed unless otherwise documented below in the visit note.  

## 2015-05-07 NOTE — Progress Notes (Signed)
Patient ID: Haley Santos, female   DOB: 05/21/1945, 69 y.o.   MRN: [id number]   Subjective:    Patient ID: Haley Santos, female    DOB: [birth year], 69 y.o.   MRN: [id number]  HPI  Patient with past history of hypercholesterolemia, breast cancer, MS and CAD.  She comes in today to follow up on these issues as well as for a complete physical exam.  She reports pain in her left wrist/base of thumb and extends up her arm.  Some pain with gripping.  Has noticed a lump - base of thumb and wrist.  Also has noticed some "knots" base of 4th and 5th fingers.  No neck pain.  No shoulder pain.  She is seeing Dr Rockey Situ for her heart.  See his note for details.  Breathing stable.  Still smoking.  Smokes two packs in three days.  No acid reflux.  No abdominal pain or cramping.  Bowels stable.  Feels she is handling stress relatively well.     Past Medical History  Diagnosis Date  . Asthma with bronchitis   . Multiple sclerosis ([place])   . Hyperthyroidism     a. s/p ablation maintained on synthroid  . GERD (gastroesophageal reflux disease)   . Glaucoma   . Hypercholesterolemia   . Breast cancer ([place])   . Crohn's disease ([place])   . Tobacco abuse   . Glucose intolerance (impaired glucose tolerance)     a. HgA1c 6.2 during 08/2014 admission   . CAD (coronary artery disease)     a. 08/2014: inf STEMI s/p DES to RCA.    Past Surgical History  Procedure Laterality Date  . Tonsillectomy and adenoidectomy  1957  . Right oophorectomy  1979  . Colonoscopy  2010    Dr Vira Agar  . Breast enhancement surgery  [place]  . Portacath placement  11/07/2012  . Left heart catheterization with coronary angiogram N/A 09/01/2014    Procedure: LEFT HEART CATHETERIZATION WITH CORONARY ANGIOGRAM;  Surgeon: Peter M Martinique, MD;  [facility] C[facility];  Service: Cardiovascular;  Laterality: N/A;  . Augmentation mammaplasty Left    Family History  Problem Relation Age of Onset  . Diabetes Mother   . Heart  disease Mother     myocardial infarction  . Glaucoma Mother   . Graves' disease Mother   . Heart attack Mother 75  . Lung disease Father   . Heart disease      half sister  . Peripheral vascular disease      half sister   Social History   Social History  . Marital Status: Divorced    Spouse Name: N/A  . Number of Children: 1  . Years of Education: N/A   Social History Main Topics  . Smoking status: Current Every Day Smoker -- 0.50 packs/day for 40 years    Types: Cigarettes  . Smokeless tobacco: Never Used  . Alcohol Use: 0.0 oz/week    0 Standard drinks or equivalent per week  . Drug Use: No  . Sexual Activity: Not Asked   Other Topics Concern  . None   Social History Narrative    Outpatient Encounter Prescriptions as of 05/07/2015  Medication Sig  . ADVAIR DISKUS 250-50 MCG/DOSE AEPB INHALE ONE DOSE BY MOUTH TWICE DAILY *RINSE MOUTH AFTER EACH USE*  . aspirin 81 MG chewable tablet Chew 1 tablet (81 mg total) by mouth daily.  Marland Kitchen latanoprost (XALATAN) 0.005 % ophthalmic solution Place  1-2 drops into both eyes daily.  Marland Kitchen levothyroxine (SYNTHROID, LEVOTHROID) 112 MCG tablet Take 1 tablet (112 mcg total) by mouth daily.  . metoprolol tartrate (LOPRESSOR) 25 MG tablet Take 0.5 tablets (12.5 mg total) by mouth 2 (two) times daily.  Marland Kitchen omeprazole (PRILOSEC OTC) 20 MG tablet Take 20 mg by mouth daily.  . rosuvastatin (CRESTOR) 5 MG tablet Take 5 mg by mouth 3 (three) times a week.   . ticagrelor (BRILINTA) 90 MG TABS tablet Take 1 tablet (90 mg total) by mouth 2 (two) times daily.  . VENTOLIN HFA 108 (90 BASE) MCG/ACT inhaler INHALE TWO PUFFS BY MOUTH EVERY 6 HOURS AS NEEDED FOR WHEEZING FOR SHORTNESS OF BREATH  . [DISCONTINUED] levothyroxine (SYNTHROID, LEVOTHROID) 112 MCG tablet Take 1 tablet (112 mcg total) by mouth daily.   No facility-administered encounter medications on file as of 05/07/2015.    Review of Systems  Constitutional: Negative for appetite change and  unexpected weight change.  HENT: Negative for congestion and sinus pressure.   Eyes: Negative for pain and visual disturbance.  Respiratory: Negative for cough, chest tightness and shortness of breath.   Cardiovascular: Negative for chest pain, palpitations and leg swelling.  Gastrointestinal: Negative for nausea, vomiting, abdominal pain and diarrhea.  Genitourinary: Negative for dysuria and difficulty urinating.  Musculoskeletal: Negative for back pain.       Left arm pain as outlined.    Skin: Negative for color change and rash.  Neurological: Negative for dizziness, light-headedness and headaches.  Hematological: Negative for adenopathy. Does not bruise/bleed easily.  Psychiatric/Behavioral: Negative for dysphoric mood and agitation.       Objective:    Physical Exam  Constitutional: She is oriented to person, place, and time. She appears well-developed and well-nourished. No distress.  HENT:  Nose: Nose normal.  Mouth/Throat: Oropharynx is clear and moist.  Eyes: Right eye exhibits no discharge. Left eye exhibits no discharge. No scleral icterus.  Neck: Neck supple. No thyromegaly present.  Cardiovascular: Normal rate and regular rhythm.   Pulmonary/Chest: Breath sounds normal. No accessory muscle usage. No tachypnea. No respiratory distress. She has no decreased breath sounds. She has no wheezes. She has no rhonchi. Right breast exhibits no inverted nipple, no mass, no nipple discharge and no tenderness (no axillary adenopathy). Left breast exhibits no inverted nipple, no mass, no nipple discharge and no tenderness (no axilarry adenopathy).  Abdominal: Soft. Bowel sounds are normal. There is no tenderness.  Musculoskeletal: She exhibits no edema or tenderness.  Increased pain with palpation over the base of the thumb - extended in the left wrist.  Increased soft tissue fullness above left wrist.  Grip strength normal.  No pain with full extension of her arm.  No pain with rotation  of her neck.   Lymphadenopathy:    She has no cervical adenopathy.  Neurological: She is alert and oriented to person, place, and time.  Skin: Skin is warm. No rash noted.  Psychiatric: She has a normal mood and affect. Her behavior is normal.    BP 120/62 mmHg  Pulse 80  Temp(Src) 98 F (36.7 C) (Oral)  Resp 18  Ht 5' 6"  (1.676 m)  Wt 211 lb 12 oz (96.049 kg)  BMI 34.19 kg/m2  SpO2 95%  LMP 06/15/1977 Wt Readings from Last 3 Encounters:  05/07/15 211 lb 12 oz (96.049 kg)  03/18/15 210 lb (95.255 kg)  01/30/15 206 lb 12 oz (93.781 kg)     Lab Results  Component Value Date  WBC 6.4 02/03/2015   HGB 12.8 02/03/2015   HCT 38.0 02/03/2015   PLT 194.0 02/03/2015   GLUCOSE 119* 02/03/2015   CHOL 209* 03/18/2015   TRIG 202.0* 03/18/2015   HDL 45.20 03/18/2015   LDLDIRECT 147.0 03/18/2015   LDLCALC 189* 09/02/2014   ALT 18 02/03/2015   AST 17 02/03/2015   NA 141 02/03/2015   K 4.4 02/03/2015   CL 105 02/03/2015   CREATININE 0.85 02/03/2015   BUN 13 02/03/2015   CO2 28 02/03/2015   TSH 0.36 03/18/2015   HGBA1C 6.4 02/03/2015    Mm Diag Breast W/implant Tomo Bilateral  10/27/2014  CLINICAL DATA:  History of milk malignant lumpectomy of the right breast in 2014. Followup evaluation. EXAM: DIGITAL DIAGNOSTIC BILATERAL MAMMOGRAM WITH IMPLANTS, 3D TOMOSYNTHESIS WITH CAD The patient has a left breast retroglandular implant. Standard and implant displaced views were performed. COMPARISON:  Previous examinations the most recent of which is dated 10/23/2013. ACR Breast Density Category b: There are scattered areas of fibroglandular density. FINDINGS: There are scarring changes located within the central/superior right breast related to the patient's lumpectomy. There is no specific evidence for recurrent tumor or developing malignancy within either breast. Mammographic images were processed with CAD. IMPRESSION: Stable breast parenchymal pattern. No findings worrisome for recurrent  tumor or developing malignancy. RECOMMENDATION: Bilateral diagnostic mammography in 1 year. I have discussed the findings and recommendations with the patient. Results were also provided in writing at the conclusion of the visit. If applicable, a reminder letter will be sent to the patient regarding the next appointment. BI-RADS CATEGORY  2: Benign. Electronically Signed   By: Altamese Cabal M.D.   On: 10/27/2014 14:44       Assessment & Plan:   Problem List Items Addressed This Visit    Breast cancer of upper-outer quadrant of right female breast [facility])    Is s/p chemotherapy.  Completed XRT.  She desires no f/u with oncology.  Mammogram 10/27/14 - Birads II.        CAD (coronary artery disease)    S/p MI.  S/p RCA stent placement.  Has 50-70% LAD lesion.  Followed by cardiology.  Continue aggressive risk factor modification.  Discussed cholesterol.  Currently doing well.  On brilinta.        Carotid artery stenosis    Followed by Dr Rockey Situ.  Recommended f/u carotid ultrasound in one year.       Crohn's disease ([place])    No GI issues.  Follow.        GERD (gastroesophageal reflux disease)    On omeprazole.  No upper symptoms reported.        Hypercholesterolemia    She is on low dose crestor.  Not taking daily.  Discussed increasing frequency taking.  She has increased to 4 days per week.  Follow.  Discussed the desire to get her cholesterol under better control.        Relevant Orders   Lipid panel   Hepatic function panel   Hyperglycemia    Low carb diet and exercise.  Follow met b and a1c.       Relevant Orders   Hemoglobin A1c   Hypertension    Blood pressure under good control.  Continue same medication regimen.  Follow pressures.  Follow metabolic panel.        Relevant Orders   CBC with Differential/Platelet   Basic metabolic panel   Hypothyroidism    On thyroid replacement.  Follow tsh.  Relevant Medications   levothyroxine (SYNTHROID, LEVOTHROID)  112 MCG tablet   Other Relevant Orders   TSH   Left arm pain    Pain as outlined.  Has the increased soft tissue at her wrist.  Increased pain.  Plays the piano.  No swelling of her arm.  Refer to ortho for further evaluation and treatment.  Unable to take antiinflammatories.        Relevant Orders   Ambulatory referral to Orthopedic Surgery   Multiple sclerosis ([place])    Stable.  Has declined further w/up.        Pain of left calf    She sees vascular surgery.  Will schedule an earlier appt for lower extremity ultrasound.        Tobacco abuse    Discussed the need to quit smoking.  Not ready to quit.  Follow.         Other Visit Diagnoses    Calf pain, left    -  Primary    Relevant Orders    Ambulatory referral to Vascular Surgery        Einar Pheasant, MD

## 2015-05-08 ENCOUNTER — Encounter: Payer: Self-pay | Admitting: Internal Medicine

## 2015-05-08 DIAGNOSIS — E119 Type 2 diabetes mellitus without complications: Secondary | ICD-10-CM | POA: Insufficient documentation

## 2015-05-08 DIAGNOSIS — M79602 Pain in left arm: Secondary | ICD-10-CM | POA: Insufficient documentation

## 2015-05-08 DIAGNOSIS — M79662 Pain in left lower leg: Secondary | ICD-10-CM | POA: Insufficient documentation

## 2015-05-08 NOTE — Assessment & Plan Note (Signed)
Pain as outlined.  Has the increased soft tissue at her wrist.  Increased pain.  Plays the piano.  No swelling of her arm.  Refer to ortho for further evaluation and treatment.  Unable to take antiinflammatories.

## 2015-05-08 NOTE — Assessment & Plan Note (Signed)
Blood pressure under good control.  Continue same medication regimen.  Follow pressures.  Follow metabolic panel.   

## 2015-05-08 NOTE — Assessment & Plan Note (Signed)
Followed by Dr Rockey Situ.  Recommended f/u carotid ultrasound in one year.

## 2015-05-08 NOTE — Assessment & Plan Note (Signed)
Discussed the need to quit smoking.  Not ready to quit.  Follow.

## 2015-05-08 NOTE — Assessment & Plan Note (Signed)
She sees vascular surgery.  Will schedule an earlier appt for lower extremity ultrasound.

## 2015-05-08 NOTE — Assessment & Plan Note (Signed)
S/p MI.  S/p RCA stent placement.  Has 50-70% LAD lesion.  Followed by cardiology.  Continue aggressive risk factor modification.  Discussed cholesterol.  Currently doing well.  On brilinta.

## 2015-05-08 NOTE — Assessment & Plan Note (Signed)
On omeprazole.  No upper symptoms reported.

## 2015-05-08 NOTE — Assessment & Plan Note (Signed)
No GI issues.  Follow.

## 2015-05-08 NOTE — Assessment & Plan Note (Signed)
On thyroid replacement.  Follow tsh.  

## 2015-05-08 NOTE — Assessment & Plan Note (Signed)
She is on low dose crestor.  Not taking daily.  Discussed increasing frequency taking.  She has increased to 4 days per week.  Follow.  Discussed the desire to get her cholesterol under better control.

## 2015-05-08 NOTE — Assessment & Plan Note (Signed)
Is s/p chemotherapy.  Completed XRT.  She desires no f/u with oncology.  Mammogram 10/27/14 - Birads II.

## 2015-05-08 NOTE — Assessment & Plan Note (Signed)
Stable.  Has declined further w/up.

## 2015-05-08 NOTE — Assessment & Plan Note (Signed)
Low carb diet and exercise.  Follow met b and a1c.  

## 2015-05-14 DIAGNOSIS — E669 Obesity, unspecified: Secondary | ICD-10-CM | POA: Diagnosis not present

## 2015-05-14 DIAGNOSIS — I10 Essential (primary) hypertension: Secondary | ICD-10-CM | POA: Diagnosis not present

## 2015-05-14 DIAGNOSIS — E785 Hyperlipidemia, unspecified: Secondary | ICD-10-CM | POA: Diagnosis not present

## 2015-05-14 DIAGNOSIS — I2510 Atherosclerotic heart disease of native coronary artery without angina pectoris: Secondary | ICD-10-CM | POA: Diagnosis not present

## 2015-05-14 DIAGNOSIS — M79609 Pain in unspecified limb: Secondary | ICD-10-CM | POA: Diagnosis not present

## 2015-05-14 DIAGNOSIS — I6523 Occlusion and stenosis of bilateral carotid arteries: Secondary | ICD-10-CM | POA: Diagnosis not present

## 2015-05-20 DIAGNOSIS — I2510 Atherosclerotic heart disease of native coronary artery without angina pectoris: Secondary | ICD-10-CM | POA: Diagnosis not present

## 2015-05-20 DIAGNOSIS — I6523 Occlusion and stenosis of bilateral carotid arteries: Secondary | ICD-10-CM | POA: Diagnosis not present

## 2015-05-20 DIAGNOSIS — E669 Obesity, unspecified: Secondary | ICD-10-CM | POA: Diagnosis not present

## 2015-05-20 DIAGNOSIS — I10 Essential (primary) hypertension: Secondary | ICD-10-CM | POA: Diagnosis not present

## 2015-05-20 DIAGNOSIS — E785 Hyperlipidemia, unspecified: Secondary | ICD-10-CM | POA: Diagnosis not present

## 2015-05-20 DIAGNOSIS — M79609 Pain in unspecified limb: Secondary | ICD-10-CM | POA: Diagnosis not present

## 2015-05-27 DIAGNOSIS — H401131 Primary open-angle glaucoma, bilateral, mild stage: Secondary | ICD-10-CM | POA: Diagnosis not present

## 2015-05-29 DIAGNOSIS — M654 Radial styloid tenosynovitis [de Quervain]: Secondary | ICD-10-CM | POA: Diagnosis not present

## 2015-06-09 ENCOUNTER — Telehealth: Payer: Self-pay | Admitting: *Deleted

## 2015-06-21 ENCOUNTER — Other Ambulatory Visit: Payer: Self-pay | Admitting: Internal Medicine

## 2015-06-25 NOTE — Telephone Encounter (Signed)
Error

## 2015-06-26 ENCOUNTER — Encounter: Payer: Self-pay | Admitting: Hematology and Oncology

## 2015-06-29 ENCOUNTER — Telehealth: Payer: Self-pay | Admitting: Internal Medicine

## 2015-06-29 NOTE — Telephone Encounter (Signed)
Attempted to call patient, no answer, left VM to return my call.

## 2015-06-29 NOTE — Telephone Encounter (Signed)
Please let her know that I am not in the office.  Needs to be evaluated.

## 2015-06-29 NOTE — Telephone Encounter (Signed)
Spoke with patient, she is willing to see Dr. Caryl Bis tomorrow, scheduled

## 2015-06-29 NOTE — Telephone Encounter (Signed)
Spoke with the patient, she tries to avoid steroids and antibiotics, but has been treating a URI with OTC Mucinex and saline nose spray and it has not gotten better.  Due to chemo in the past she is prone to nose bleeds and her sinus drainage is bloody and green in color out of both nostrils and has a very productive cough with the same drainage.  She has not run a fever and says that the mucinex has thinned it slightly, but she is miserable.  Please advise.  thanks

## 2015-06-29 NOTE — Telephone Encounter (Signed)
Patient has and upper respiratory infection she has been treating it for a week , however she is not getting any better . She is producing bloody green mucus.

## 2015-06-29 NOTE — Telephone Encounter (Signed)
Patient returned call, and will be at telephone number [phone] for the remainder of the day.

## 2015-06-30 ENCOUNTER — Ambulatory Visit (INDEPENDENT_AMBULATORY_CARE_PROVIDER_SITE_OTHER): Payer: Medicare Other | Admitting: Family Medicine

## 2015-06-30 ENCOUNTER — Encounter: Payer: Self-pay | Admitting: Hematology and Oncology

## 2015-06-30 ENCOUNTER — Encounter: Payer: Self-pay | Admitting: Family Medicine

## 2015-06-30 VITALS — BP 126/64 | HR 84 | Temp 98.0°F | Ht 66.0 in | Wt 212.0 lb

## 2015-06-30 DIAGNOSIS — J0110 Acute frontal sinusitis, unspecified: Secondary | ICD-10-CM | POA: Insufficient documentation

## 2015-06-30 MED ORDER — AMOXICILLIN-POT CLAVULANATE 875-125 MG PO TABS: 1.0000 | ORAL_TABLET | Freq: Two times a day (BID) | ORAL | Status: DC

## 2015-06-30 NOTE — Progress Notes (Signed)
Patient ID: Haley Santos, female   DOB: 1945/07/06, 70 y.o.   MRN: [id number]  Tommi Rumps, MD Phone: [phone]  Haley Santos is a 70 y.o. female who presents today for same-day visit.  Patient notes for the last 7 days she has had sinus and nasal congestion and subsequently developed chest congestion. She notes she is blowing green mucus that is blood-tinged out of her nose. She is coughing up green mucus. Minimal shortness of breath with cough. No other shortness of breath. No chest pain. No fevers. Mild wheezing. Has been using her albuterol more than usual. She did visit her from the hospital. She's been using no spray and Mucinex. She notes pressure sensation in her sinuses. No numbness, weakness, or vision changes.  PMH: Smoker   ROS see history of present illness  Objective  Physical Exam Filed Vitals:   06/30/15 1123  BP: 126/64  Pulse: 84  Temp: 98 F (36.7 C)    BP Readings from Last 3 Encounters:  06/30/15 126/64  05/07/15 120/62  03/18/15 138/70   Wt Readings from Last 3 Encounters:  06/30/15 212 lb (96.163 kg)  05/07/15 211 lb 12 oz (96.049 kg)  03/18/15 210 lb (95.255 kg)    Physical Exam  Constitutional: She is well-developed, well-nourished, and in no distress.  HENT:  Head: Normocephalic and atraumatic.  Right Ear: External ear normal.  Left Ear: External ear normal.  Mouth/Throat: Oropharynx is clear and moist. No oropharyngeal exudate.  Normal TMs bilaterally, mild discomfort on percussion of frontal and maxillary sinuses  Eyes: Conjunctivae are normal. Pupils are equal, round, and reactive to light.  Neck: Neck supple.  Cardiovascular: Normal rate, regular rhythm and normal heart sounds.  Exam reveals no gallop and no friction rub.   No murmur heard. Pulmonary/Chest: Effort normal and breath sounds normal. No respiratory distress. She has no wheezes. She has no rales.  Lymphadenopathy:    She has no cervical adenopathy.  Neurological:  She is alert. Gait normal.  CN 2-12 intact, 5/5 strength in bilateral biceps, triceps, grip, quads, hamstrings, plantar and dorsiflexion, sensation to light touch intact in bilateral UE and LE, normal gait, 2+ patellar reflexes  Skin: Skin is warm and dry. She is not diaphoretic.     Assessment/Plan: Please see individual problem list.  Sinusitis, acute frontal Symptoms consistent with bacterial sinusitis. She likely has bronchitis as well with possible COPD exacerbation component. We will treat with Augmentin as patient states this is been beneficial for her in the past. I discussed treating with prednisone, though she declined this at this time. She will continue use her inhaler. She is given return precautions.    Meds ordered this encounter  Medications  . amoxicillin-clavulanate (AUGMENTIN) 875-125 MG tablet    Sig: Take 1 tablet by mouth 2 (two) times daily.    Dispense:  14 tablet    Refill:  0    Tommi Rumps

## 2015-06-30 NOTE — Assessment & Plan Note (Signed)
Symptoms consistent with bacterial sinusitis. She likely has bronchitis as well with possible COPD exacerbation component. We will treat with Augmentin as patient states this is been beneficial for her in the past. I discussed treating with prednisone, though she declined this at this time. She will continue use her inhaler. She is given return precautions.

## 2015-06-30 NOTE — Patient Instructions (Signed)
Nice to meet you. We will treat you for a sinus infection with Augmentin. Please continue to use her albuterol inhaler as needed. If you develop fevers, shortness of breath, chest pain, cough productive of blood, or any new or changing symptoms please seek medical attention.

## 2015-06-30 NOTE — Progress Notes (Signed)
Pre visit review using our clinic review tool, if applicable. No additional management support is needed unless otherwise documented below in the visit note. 

## 2015-07-04 ENCOUNTER — Emergency Department: Payer: Medicare Other

## 2015-07-04 ENCOUNTER — Encounter: Payer: Self-pay | Admitting: Emergency Medicine

## 2015-07-04 ENCOUNTER — Emergency Department
Admission: EM | Admit: 2015-07-04 | Discharge: 2015-07-04 | Disposition: A | Discharge status: Home / Self Care | Payer: Medicare Other | Attending: Emergency Medicine | Admitting: Emergency Medicine

## 2015-07-04 DIAGNOSIS — K219 Gastro-esophageal reflux disease without esophagitis: Secondary | ICD-10-CM | POA: Diagnosis not present

## 2015-07-04 DIAGNOSIS — Z79899 Other long term (current) drug therapy: Secondary | ICD-10-CM | POA: Insufficient documentation

## 2015-07-04 DIAGNOSIS — I10 Essential (primary) hypertension: Secondary | ICD-10-CM | POA: Insufficient documentation

## 2015-07-04 DIAGNOSIS — Z7982 Long term (current) use of aspirin: Secondary | ICD-10-CM | POA: Diagnosis not present

## 2015-07-04 DIAGNOSIS — Z87891 Personal history of nicotine dependence: Secondary | ICD-10-CM | POA: Diagnosis not present

## 2015-07-04 DIAGNOSIS — M25512 Pain in left shoulder: Secondary | ICD-10-CM | POA: Diagnosis not present

## 2015-07-04 DIAGNOSIS — Z792 Long term (current) use of antibiotics: Secondary | ICD-10-CM | POA: Insufficient documentation

## 2015-07-04 LAB — CBC
HCT: 37.5 % (ref 35.0–47.0)
HEMOGLOBIN: 12.8 g/dL (ref 12.0–16.0)
MCH: 27.2 pg (ref 26.0–34.0)
MCHC: 34.1 g/dL (ref 32.0–36.0)
MCV: 79.9 fL — AB (ref 80.0–100.0)
Platelets: 245 10*3/uL (ref 150–440)
RBC: 4.7 MIL/uL (ref 3.80–5.20)
RDW: 14.6 % — ABNORMAL HIGH (ref 11.5–14.5)
WBC: 7.9 10*3/uL (ref 3.6–11.0)

## 2015-07-04 LAB — BASIC METABOLIC PANEL
ANION GAP: 7 (ref 5–15)
BUN: 13 mg/dL (ref 6–20)
CALCIUM: 8.9 mg/dL (ref 8.9–10.3)
CHLORIDE: 104 mmol/L (ref 101–111)
CO2: 28 mmol/L (ref 22–32)
Creatinine, Ser: 1.02 mg/dL — ABNORMAL HIGH (ref 0.44–1.00)
GFR calc non Af Amer: 56 mL/min — ABNORMAL LOW (ref >60)
Glucose, Bld: 150 mg/dL — ABNORMAL HIGH (ref 65–99)
Potassium: 4.4 mmol/L (ref 3.5–5.1)
Sodium: 139 mmol/L (ref 135–145)

## 2015-07-04 LAB — TROPONIN I

## 2015-07-04 NOTE — Discharge Instructions (Signed)

## 2015-07-04 NOTE — ED Notes (Signed)
Pt also reports having loose stools since starting Augmentin having 4 stools today.

## 2015-07-04 NOTE — ED Provider Notes (Signed)
[facility] Emergency Department Provider Note  ____________________________________________    I have reviewed the triage vital signs and the nursing notes.   HISTORY  Chief Complaint Shoulder pain   HPI Haley Santos is a 70 y.o. female who presents with left-sided shoulder discomfort. She reports the pain is mild and aching in nature. She came to the emergency department because she had a heart attack last year and her cardiologist told her that women can have atypical symptoms of a heart attack so she is here to make sure that she is okay. She reports her shoulder has been bothering her for the last 2 days. She denies injury. She reports it feels better when she moves it. No fevers or chills. No shortness of breath. No calf swelling or recent travel. No history of DVTs. She denies chest pain to me.     Past Medical History  Diagnosis Date  . Asthma with bronchitis   . Multiple sclerosis ([place])   . Hyperthyroidism     a. s/p ablation maintained on synthroid  . GERD (gastroesophageal reflux disease)   . Glaucoma   . Hypercholesterolemia   . Breast cancer ([place])   . Crohn's disease ([place])   . Tobacco abuse   . Glucose intolerance (impaired glucose tolerance)     a. HgA1c 6.2 during 08/2014 admission   . CAD (coronary artery disease)     a. 08/2014: inf STEMI s/p DES to RCA.     Patient Active Problem List   Diagnosis Date Noted  . Sinusitis, acute frontal 06/30/2015  . Left arm pain 05/08/2015  . Pain of left calf 05/08/2015  . Hyperglycemia 05/08/2015  . Carotid artery stenosis 09/19/2014  . CAD (coronary artery disease) 09/19/2014  . GERD (gastroesophageal reflux disease)   . Crohn's disease ([address])   . Glucose intolerance (impaired glucose tolerance)   . ST elevation myocardial infarction (STEMI) of inferior wall ([place]) 09/02/2014  . Tobacco abuse 09/02/2014  . Carotid bruit 03/31/2014  . Joint pain 10/06/2013  . Sinus tachycardia ([place])  12/21/2012  . Breast cancer of upper-outer quadrant of right female breast ([place]) 10/03/2012  . Breast nodule 09/20/2012  . Hypertension 06/16/2012  . Hypercholesterolemia 06/16/2012  . Asthma 06/16/2012  . Multiple sclerosis ([place]) 06/16/2012  . Psoriasis 06/16/2012  . Hypothyroidism 06/16/2012  . Glaucoma 06/16/2012    Past Surgical History  Procedure Laterality Date  . Tonsillectomy and adenoidectomy  1957  . Right oophorectomy  1979  . Colonoscopy  2010    Dr Vira Agar  . Breast enhancement surgery  [place]  . Portacath placement  11/07/2012  . Left heart catheterization with coronary angiogram N/A 09/01/2014    Procedure: LEFT HEART CATHETERIZATION WITH CORONARY ANGIOGRAM;  Surgeon: Peter M Martinique, MD;  Location: [facility] CATH LAB;  Service: Cardiovascular;  Laterality: N/A;  . Augmentation mammaplasty Left     Current Outpatient Rx  Name  Route  Sig  Dispense  Refill  . ADVAIR DISKUS 250-50 MCG/DOSE AEPB      INHALE ONE DOSE BY MOUTH TWICE DAILY* RINSE MOUTH AFTER EACH USE*   60 each   5   . amoxicillin-clavulanate (AUGMENTIN) 875-125 MG tablet   Oral   Take 1 tablet by mouth 2 (two) times daily.   14 tablet   0   . aspirin 81 MG chewable tablet   Oral   Chew 1 tablet (81 mg total) by mouth daily.         Marland Kitchen  latanoprost (XALATAN) 0.005 % ophthalmic solution   Both Eyes   Place 1-2 drops into both eyes daily.         Marland Kitchen levothyroxine (SYNTHROID, LEVOTHROID) 112 MCG tablet   Oral   Take 1 tablet (112 mcg total) by mouth daily.   90 tablet   3   . metoprolol tartrate (LOPRESSOR) 25 MG tablet   Oral   Take 0.5 tablets (12.5 mg total) by mouth 2 (two) times daily.   60 tablet   11   . omeprazole (PRILOSEC OTC) 20 MG tablet   Oral   Take 20 mg by mouth daily.         . rosuvastatin (CRESTOR) 5 MG tablet   Oral   Take 5 mg by mouth 3 (three) times a week.          . ticagrelor (BRILINTA) 90 MG TABS tablet   Oral   Take 1 tablet (90 mg  total) by mouth 2 (two) times daily.   60 tablet   11   . VENTOLIN HFA 108 (90 BASE) MCG/ACT inhaler      INHALE TWO PUFFS BY MOUTH EVERY 6 HOURS AS NEEDED FOR WHEEZING FOR SHORTNESS OF BREATH   54 each   0     Allergies Statins; Clindamycin/lincomycin; Codeine; Shellfish allergy; and Valium  Family History  Problem Relation Age of Onset  . Diabetes Mother   . Heart disease Mother     myocardial infarction  . Glaucoma Mother   . Graves' disease Mother   . Heart attack Mother 10  . Lung disease Father   . Heart disease      half sister  . Peripheral vascular disease      half sister    Social History Social History  Substance Use Topics  . Smoking status: Former Smoker -- 0.00 packs/day for 40 years    Quit date: 07/01/2015  . Smokeless tobacco: Never Used  . Alcohol Use: 0.0 oz/week    0 Standard drinks or equivalent per week    Review of Systems  Constitutional: Negative for fever. Eyes: Negative for visual changes. ENT: Negative for neck pain Cardiovascular: Negative for chest pain. Respiratory: Negative for shortness of breath. Gastrointestinal: Negative for abdominal pain  Musculoskeletal: Left shoulder pain as above Skin: Negative for rash. Neurological: Negative for headaches or focal weakness Psychiatric: No anxiety    ____________________________________________   PHYSICAL EXAM:  VITAL SIGNS: ED Triage Vitals  Enc Vitals Group     BP 07/04/15 1556 141/73 mmHg     Pulse Rate 07/04/15 1556 78     Resp 07/04/15 1556 18     Temp --      Temp src --      SpO2 07/04/15 1556 97 %     Weight --      Height --      Head Cir --      Peak Flow --      Pain Score 07/04/15 1512 6     Pain Loc --      Pain Edu? --      Excl. in [place]? --     Constitutional: Alert and oriented. Well appearing and in no distress. Very pleasant and interactive Eyes: Conjunctivae are normal.  ENT   Head: Normocephalic and atraumatic.   Mouth/Throat: Mucous  membranes are moist. Cardiovascular: Normal rate, regular rhythm. Normal and symmetric distal pulses are present in all extremities.  Respiratory: Normal respiratory effort without tachypnea nor  retractions. Breath sounds are clear and equal bilaterally.  Gastrointestinal: Soft and non-tender in all quadrants. No distention.  Genitourinary: deferred Musculoskeletal: Nontender with normal range of motion in all extremities. No lower extremity tenderness nor edema. Benign shoulder exam. 2+ distal pulses. Neurologic:  Normal speech and language. No gross focal neurologic deficits are appreciated. Skin:  Skin is warm, dry and intact. No rash noted. Psychiatric: Mood and affect are normal. Patient exhibits appropriate insight and judgment.  ____________________________________________    LABS (pertinent positives/negatives)  Labs Reviewed  BASIC METABOLIC PANEL - Abnormal; Notable for the following:    Glucose, Bld 150 (*)    Creatinine, Ser 1.02 (*)    GFR calc non Af Amer 56 (*)    All other components within normal limits  CBC - Abnormal; Notable for the following:    MCV 79.9 (*)    RDW 14.6 (*)    All other components within normal limits  TROPONIN I    ____________________________________________   EKG  ED ECG REPORT I, Lavonia Drafts, the attending physician, personally viewed and interpreted this ECG.  Date: 07/04/2015 EKG Time: 2:40 PM Rate: 93 Rhythm: normal sinus rhythm QRS Axis: normal Intervals: normal ST/T Wave abnormalities: normal Conduction Disturbances: none Narrative Interpretation: Nonspecific T wave changes    ____________________________________________    RADIOLOGY I have personally reviewed any xrays that were ordered on this patient: None  ____________________________________________   PROCEDURES  Procedure(s) performed: none  Critical Care performed: none  ____________________________________________   INITIAL IMPRESSION /  ASSESSMENT AND PLAN / ED COURSE  Pertinent labs & imaging results that were available during my care of the patient were reviewed by me and considered in my medical decision making (see chart for details).  Patient with unremarkable EKG and normal troponin. She is greatly relieved by this. I did offer imaging for her shoulder which she declined. She would prefer to follow up with her PCP. She is just here to make sure she is not having a heart attack. Symptoms, EKG, negative troponin not consistent with ACS. She is not having chest pain either  ____________________________________________   FINAL CLINICAL IMPRESSION(S) / ED DIAGNOSES  Final diagnoses:  Shoulder pain, acute, left     Lavonia Drafts, MD 07/04/15 [phone]

## 2015-07-04 NOTE — ED Notes (Signed)
Pt states on Tuesday was dx with URI and prescribed Augmentin which she started on Wednesday and since starting the medication she has had left shoulder pain and chest discomfort, radiating halfway down upper arm. Has a history of MI with similar sxs in April 2016.  Pt has a port for chemo which she also feels causes discomfort in her chest.

## 2015-07-06 ENCOUNTER — Telehealth: Payer: Self-pay | Admitting: Cardiovascular Disease

## 2015-07-06 MED ORDER — TICAGRELOR 90 MG PO TABS: 90.0000 mg | ORAL_TABLET | Freq: Two times a day (BID) | ORAL | Status: DC

## 2015-07-06 NOTE — Telephone Encounter (Signed)
°*  STAT* If patient is at the pharmacy, call can be transferred to refill team.   1. Which medications need to be refilled? (please list name of each medication and dose if known) Brilinta 90 MG  2. Which pharmacy/location (including street and city if local pharmacy) is medication to be sent to? [facility] [address]   3. Do they need a 30 day or 90 day supply? 90 day

## 2015-07-06 NOTE — Telephone Encounter (Signed)
Refill sent for Brilinta 90 mg  

## 2015-07-07 ENCOUNTER — Inpatient Hospital Stay: Payer: Medicare Other | Admitting: Hematology and Oncology

## 2015-07-07 ENCOUNTER — Inpatient Hospital Stay: Payer: Medicare Other

## 2015-07-16 DIAGNOSIS — M5412 Radiculopathy, cervical region: Secondary | ICD-10-CM | POA: Diagnosis not present

## 2015-07-16 DIAGNOSIS — M4722 Other spondylosis with radiculopathy, cervical region: Secondary | ICD-10-CM | POA: Diagnosis not present

## 2015-07-16 DIAGNOSIS — M542 Cervicalgia: Secondary | ICD-10-CM | POA: Diagnosis not present

## 2015-07-30 DIAGNOSIS — M5412 Radiculopathy, cervical region: Secondary | ICD-10-CM | POA: Diagnosis not present

## 2015-08-03 ENCOUNTER — Other Ambulatory Visit: Payer: Medicare Other

## 2015-08-03 DIAGNOSIS — M5412 Radiculopathy, cervical region: Secondary | ICD-10-CM | POA: Diagnosis not present

## 2015-08-04 ENCOUNTER — Other Ambulatory Visit (INDEPENDENT_AMBULATORY_CARE_PROVIDER_SITE_OTHER): Payer: Medicare Other

## 2015-08-04 DIAGNOSIS — I10 Essential (primary) hypertension: Secondary | ICD-10-CM | POA: Diagnosis not present

## 2015-08-04 DIAGNOSIS — E038 Other specified hypothyroidism: Secondary | ICD-10-CM | POA: Diagnosis not present

## 2015-08-04 DIAGNOSIS — R739 Hyperglycemia, unspecified: Secondary | ICD-10-CM

## 2015-08-04 DIAGNOSIS — E7800 Pure hypercholesterolemia, unspecified: Secondary | ICD-10-CM | POA: Diagnosis not present

## 2015-08-04 LAB — LIPID PANEL
CHOLESTEROL: 296 mg/dL — AB (ref 0–200)
HDL: 44.2 mg/dL (ref >39.00)
NonHDL: 251.49
TRIGLYCERIDES: 237 mg/dL — AB (ref 0.0–149.0)
Total CHOL/HDL Ratio: 7
VLDL: 47.4 mg/dL — ABNORMAL HIGH (ref 0.0–40.0)

## 2015-08-04 LAB — BASIC METABOLIC PANEL
BUN: 14 mg/dL (ref 6–23)
CO2: 30 mEq/L (ref 19–32)
CREATININE: 0.95 mg/dL (ref 0.40–1.20)
Calcium: 9.7 mg/dL (ref 8.4–10.5)
Chloride: 102 mEq/L (ref 96–112)
GFR: 62.24 mL/min (ref >60.00)
Glucose, Bld: 130 mg/dL — ABNORMAL HIGH (ref 70–99)
Potassium: 4.6 mEq/L (ref 3.5–5.1)
Sodium: 138 mEq/L (ref 135–145)

## 2015-08-04 LAB — CBC WITH DIFFERENTIAL/PLATELET
BASOS ABS: 0 10*3/uL (ref 0.0–0.1)
Basophils Relative: 0.3 % (ref 0.0–3.0)
EOS ABS: 0.3 10*3/uL (ref 0.0–0.7)
Eosinophils Relative: 4.4 % (ref 0.0–5.0)
HEMATOCRIT: 37 % (ref 36.0–46.0)
Hemoglobin: 12.5 g/dL (ref 12.0–15.0)
LYMPHS ABS: 1.1 10*3/uL (ref 0.7–4.0)
LYMPHS PCT: 17.2 % (ref 12.0–46.0)
MCHC: 33.8 g/dL (ref 30.0–36.0)
MCV: 79.8 fl (ref 78.0–100.0)
Monocytes Absolute: 0.4 10*3/uL (ref 0.1–1.0)
Monocytes Relative: 6 % (ref 3.0–12.0)
NEUTROS ABS: 4.8 10*3/uL (ref 1.4–7.7)
NEUTROS PCT: 72.1 % (ref 43.0–77.0)
PLATELETS: 232 10*3/uL (ref 150.0–400.0)
RBC: 4.63 Mil/uL (ref 3.87–5.11)
RDW: 15.4 % (ref 11.5–15.5)
WBC: 6.7 10*3/uL (ref 4.0–10.5)

## 2015-08-04 LAB — HEPATIC FUNCTION PANEL
ALBUMIN: 4 g/dL (ref 3.5–5.2)
ALK PHOS: 88 U/L (ref 39–117)
ALT: 28 U/L (ref 0–35)
AST: 21 U/L (ref 0–37)
Bilirubin, Direct: 0 mg/dL (ref 0.0–0.3)
TOTAL PROTEIN: 7 g/dL (ref 6.0–8.3)
Total Bilirubin: 0.5 mg/dL (ref 0.2–1.2)

## 2015-08-04 LAB — TSH: TSH: 3.22 u[IU]/mL (ref 0.35–4.50)

## 2015-08-04 LAB — LDL CHOLESTEROL, DIRECT: LDL DIRECT: 206 mg/dL

## 2015-08-04 LAB — HEMOGLOBIN A1C: Hgb A1c MFr Bld: 7 % — ABNORMAL HIGH (ref 4.6–6.5)

## 2015-08-05 ENCOUNTER — Other Ambulatory Visit: Payer: Medicare Other

## 2015-08-05 ENCOUNTER — Encounter: Payer: Self-pay | Admitting: Internal Medicine

## 2015-08-06 DIAGNOSIS — M5412 Radiculopathy, cervical region: Secondary | ICD-10-CM | POA: Diagnosis not present

## 2015-08-07 ENCOUNTER — Ambulatory Visit: Payer: Medicare Other | Admitting: Internal Medicine

## 2015-08-10 ENCOUNTER — Encounter: Payer: Self-pay | Admitting: Internal Medicine

## 2015-08-10 ENCOUNTER — Ambulatory Visit (INDEPENDENT_AMBULATORY_CARE_PROVIDER_SITE_OTHER): Payer: Medicare Other | Admitting: Internal Medicine

## 2015-08-10 VITALS — BP 110/70 | HR 94 | Temp 98.2°F | Resp 18 | Ht 66.0 in | Wt 211.0 lb

## 2015-08-10 DIAGNOSIS — E7800 Pure hypercholesterolemia, unspecified: Secondary | ICD-10-CM

## 2015-08-10 DIAGNOSIS — Z720 Tobacco use: Secondary | ICD-10-CM

## 2015-08-10 DIAGNOSIS — I2510 Atherosclerotic heart disease of native coronary artery without angina pectoris: Secondary | ICD-10-CM

## 2015-08-10 DIAGNOSIS — M5412 Radiculopathy, cervical region: Secondary | ICD-10-CM | POA: Diagnosis not present

## 2015-08-10 DIAGNOSIS — I10 Essential (primary) hypertension: Secondary | ICD-10-CM | POA: Diagnosis not present

## 2015-08-10 DIAGNOSIS — K219 Gastro-esophageal reflux disease without esophagitis: Secondary | ICD-10-CM | POA: Diagnosis not present

## 2015-08-10 DIAGNOSIS — C50411 Malignant neoplasm of upper-outer quadrant of right female breast: Secondary | ICD-10-CM

## 2015-08-10 DIAGNOSIS — I6522 Occlusion and stenosis of left carotid artery: Secondary | ICD-10-CM | POA: Diagnosis not present

## 2015-08-10 DIAGNOSIS — R739 Hyperglycemia, unspecified: Secondary | ICD-10-CM

## 2015-08-10 DIAGNOSIS — G35 Multiple sclerosis: Secondary | ICD-10-CM

## 2015-08-10 DIAGNOSIS — E038 Other specified hypothyroidism: Secondary | ICD-10-CM

## 2015-08-10 DIAGNOSIS — M79602 Pain in left arm: Secondary | ICD-10-CM

## 2015-08-10 NOTE — Progress Notes (Signed)
Pre-visit discussion using our clinic review tool. No additional management support is needed unless otherwise documented below in the visit note.  

## 2015-08-10 NOTE — Progress Notes (Signed)
Patient ID: Haley Santos, female   DOB: 1945/12/25, 70 y.o.   MRN: [id number]   Subjective:    Patient ID: Haley Santos, female    DOB: 06-Oct-1945, 70 y.o.   MRN: [id number]  HPI  Patient here for a scheduled follow up.  Diagnosed with cervical radiculopathy.  Left arm pain and numbness.  Found to have C4-7 moderate degenerative changes and narrowing of disc spaces.  Undergoing physical therapy.  Discussed smoking cessation.  She is smoking 1/2 ppd now.  Plans to continue to decrease.  Breathing stable.  No chest pain or tightness.  Cholesterol elevated.  Declines cholesterol medication.  No abdominal pain or cramping.  Bowels stable.  Feels she is doing better mentally.     Past Medical History  Diagnosis Date  . Asthma with bronchitis   . Multiple sclerosis ([place])   . Hyperthyroidism     a. s/p ablation maintained on synthroid  . GERD (gastroesophageal reflux disease)   . Glaucoma   . Hypercholesterolemia   . Breast cancer ([place])   . Crohn's disease ([place])   . Tobacco abuse   . Glucose intolerance (impaired glucose tolerance)     a. HgA1c 6.2 during 08/2014 admission   . CAD (coronary artery disease)     a. 08/2014: inf STEMI s/p DES to RCA.    Past Surgical History  Procedure Laterality Date  . Tonsillectomy and adenoidectomy  1957  . Right oophorectomy  1979  . Colonoscopy  2010    Dr Vira Agar  . Breast enhancement surgery  [place]  . Portacath placement  11/07/2012  . Left heart catheterization with coronary angiogram N/A 09/01/2014    Procedure: LEFT HEART CATHETERIZATION WITH CORONARY ANGIOGRAM;  Surgeon: Peter M Martinique, MD;  Location: [facility] CATH LAB;  Service: Cardiovascular;  Laterality: N/A;  . Augmentation mammaplasty Left    Family History  Problem Relation Age of Onset  . Diabetes Mother   . Heart disease Mother     myocardial infarction  . Glaucoma Mother   . Graves' disease Mother   . Heart attack Mother 53  . Lung disease Father   . Heart  disease      half sister  . Peripheral vascular disease      half sister   Social History   Social History  . Marital Status: Divorced    Spouse Name: N/A  . Number of Children: 1  . Years of Education: N/A   Social History Main Topics  . Smoking status: Former Smoker -- 0.00 packs/day for 40 years    Quit date: 07/01/2015  . Smokeless tobacco: Never Used  . Alcohol Use: 0.0 oz/week    0 Standard drinks or equivalent per week  . Drug Use: No  . Sexual Activity: Not Asked   Other Topics Concern  . None   Social History Narrative    Outpatient Encounter Prescriptions as of 08/10/2015  Medication Sig  . ADVAIR DISKUS 250-50 MCG/DOSE AEPB INHALE ONE DOSE BY MOUTH TWICE DAILY* RINSE MOUTH AFTER EACH USE*  . aspirin 81 MG chewable tablet Chew 1 tablet (81 mg total) by mouth daily.  Marland Kitchen latanoprost (XALATAN) 0.005 % ophthalmic solution Place 1-2 drops into both eyes daily.  Marland Kitchen levothyroxine (SYNTHROID, LEVOTHROID) 112 MCG tablet Take 1 tablet (112 mcg total) by mouth daily.  . metoprolol tartrate (LOPRESSOR) 25 MG tablet Take 0.5 tablets (12.5 mg total) by mouth 2 (two) times daily.  Marland Kitchen omeprazole (PRILOSEC  OTC) 20 MG tablet Take 20 mg by mouth daily.  . rosuvastatin (CRESTOR) 5 MG tablet Take 5 mg by mouth 3 (three) times a week.   . ticagrelor (BRILINTA) 90 MG TABS tablet Take 1 tablet (90 mg total) by mouth 2 (two) times daily.  . VENTOLIN HFA 108 (90 BASE) MCG/ACT inhaler INHALE TWO PUFFS BY MOUTH EVERY 6 HOURS AS NEEDED FOR WHEEZING FOR SHORTNESS OF BREATH  . [DISCONTINUED] amoxicillin-clavulanate (AUGMENTIN) 875-125 MG tablet Take 1 tablet by mouth 2 (two) times daily.   No facility-administered encounter medications on file as of 08/10/2015.    Review of Systems  Constitutional: Negative for appetite change and unexpected weight change.  HENT: Negative for congestion and sinus pressure.   Respiratory: Negative for cough, chest tightness and shortness of breath.     Cardiovascular: Negative for chest pain, palpitations and leg swelling.  Gastrointestinal: Negative for nausea, vomiting, abdominal pain and diarrhea.  Genitourinary: Negative for dysuria and difficulty urinating.  Musculoskeletal: Negative for back pain and joint swelling.  Skin: Negative for color change and rash.  Neurological: Negative for dizziness, light-headedness and headaches.  Psychiatric/Behavioral: Negative for dysphoric mood and agitation.       Objective:    Physical Exam  Constitutional: She appears well-developed and well-nourished. No distress.  HENT:  Nose: Nose normal.  Mouth/Throat: Oropharynx is clear and moist.  Neck: Neck supple. No thyromegaly present.  Cardiovascular: Normal rate and regular rhythm.   Pulmonary/Chest: Breath sounds normal. No respiratory distress. She has no wheezes.  Abdominal: Soft. Bowel sounds are normal. There is no tenderness.  Musculoskeletal: She exhibits no edema or tenderness.  Lymphadenopathy:    She has no cervical adenopathy.  Skin: No rash noted. No erythema.  Psychiatric: She has a normal mood and affect. Her behavior is normal.    BP 110/70 mmHg  Pulse 94  Temp(Src) 98.2 F (36.8 C) (Oral)  Resp 18  Ht 5' 6"  (1.676 m)  Wt 211 lb (95.709 kg)  BMI 34.07 kg/m2  SpO2 95%  LMP 06/15/1977 Wt Readings from Last 3 Encounters:  08/10/15 211 lb (95.709 kg)  06/30/15 212 lb (96.163 kg)  05/07/15 211 lb 12 oz (96.049 kg)     Lab Results  Component Value Date   WBC 6.7 08/04/2015   HGB 12.5 08/04/2015   HCT 37.0 08/04/2015   PLT 232.0 08/04/2015   GLUCOSE 130* 08/04/2015   CHOL 296* 08/04/2015   TRIG 237.0* 08/04/2015   HDL 44.20 08/04/2015   LDLDIRECT 206.0 08/04/2015   LDLCALC 189* 09/02/2014   ALT 28 08/04/2015   AST 21 08/04/2015   NA 138 08/04/2015   K 4.6 08/04/2015   CL 102 08/04/2015   CREATININE 0.95 08/04/2015   BUN 14 08/04/2015   CO2 30 08/04/2015   TSH 3.22 08/04/2015   HGBA1C 7.0* 08/04/2015        Assessment & Plan:   Problem List Items Addressed This Visit    Breast cancer of upper-outer quadrant of right female breast ([place])    Is s/p chemotherapy.  Completed XRT.  She desires no further oncology evaluation.  Mammogram 10/27/14 - Birads II.        CAD (coronary artery disease)    S/p MI.  S/p RCA stent placement.  Has 50-70% LAD lesion.  Followed by cardiology.  Continue risk factor modification.  Declines cholesterol medication.  Follow.        Carotid artery stenosis    Followed by Dr Rockey Situ.  Recommended yearly f/u.        GERD (gastroesophageal reflux disease)    On omeprazole.  No acid reflux.        Hypercholesterolemia    LDL cholesterol 206.  Discussed with her today.  She will restart crestor.  Follow.        Relevant Orders   Lipid panel   Hepatic function panel   Hyperglycemia    Low carb diet and exercise.  Follow.        Relevant Orders   Hemoglobin A1c   Microalbumin / creatinine urine ratio   Hypertension - Primary    Blood pressure under good control.  Continue same medication regimen.  Follow pressures.  Follow metabolic panel.        Relevant Orders   Basic metabolic panel   Hypothyroidism    On thyroid replacement.  Follow tsh.        Left arm pain    Left arm pain.  MRI as outlined.  Going to therapy.  Continue to f/u with ortho.        Multiple sclerosis ([facility])    Stable.  Declines any further evaluation or w/up.  Declines neurology evaluation.        Tobacco abuse    Discussed the need to quit smoking.  She is decreasing.  Follow.            Einar [place]he[place], MD

## 2015-08-13 DIAGNOSIS — M5412 Radiculopathy, cervical region: Secondary | ICD-10-CM | POA: Diagnosis not present

## 2015-08-15 ENCOUNTER — Encounter: Payer: Self-pay | Admitting: Internal Medicine

## 2015-08-16 ENCOUNTER — Encounter: Payer: Self-pay | Admitting: Internal Medicine

## 2015-08-16 NOTE — Assessment & Plan Note (Signed)
Is s/p chemotherapy.  Completed XRT.  She desires no further oncology evaluation.  Mammogram 10/27/14 - Birads II.

## 2015-08-16 NOTE — Assessment & Plan Note (Signed)
S/p MI.  S/p RCA stent placement.  Has 50-70% LAD lesion.  Followed by cardiology.  Continue risk factor modification.  Declines cholesterol medication.  Follow.

## 2015-08-16 NOTE — Assessment & Plan Note (Signed)
LDL cholesterol 206.  Discussed with her today.  She will restart crestor.  Follow.

## 2015-08-16 NOTE — Assessment & Plan Note (Signed)
Low carb diet and exercise.  Follow.

## 2015-08-16 NOTE — Assessment & Plan Note (Signed)
On thyroid replacement.  Follow tsh.  

## 2015-08-16 NOTE — Assessment & Plan Note (Signed)
On omeprazole.  No acid reflux.

## 2015-08-16 NOTE — Assessment & Plan Note (Signed)
Followed by Dr Rockey Situ.  Recommended yearly f/u.

## 2015-08-16 NOTE — Assessment & Plan Note (Signed)
Stable.  Declines any further evaluation or w/up.  Declines neurology evaluation.

## 2015-08-16 NOTE — Assessment & Plan Note (Signed)
Left arm pain.  MRI as outlined.  Going to therapy.  Continue to f/u with ortho.

## 2015-08-16 NOTE — Assessment & Plan Note (Signed)
Discussed the need to quit smoking.  She is decreasing.  Follow.

## 2015-08-16 NOTE — Assessment & Plan Note (Signed)
Blood pressure under good control.  Continue same medication regimen.  Follow pressures.  Follow metabolic panel.   

## 2015-08-17 DIAGNOSIS — M5412 Radiculopathy, cervical region: Secondary | ICD-10-CM | POA: Diagnosis not present

## 2015-08-20 DIAGNOSIS — M5412 Radiculopathy, cervical region: Secondary | ICD-10-CM | POA: Diagnosis not present

## 2015-08-27 DIAGNOSIS — M5412 Radiculopathy, cervical region: Secondary | ICD-10-CM | POA: Diagnosis not present

## 2015-08-27 IMAGING — US ULTRASOUND RIGHT BREAST
1 series · 7 of 7 positions shown · non-contrast
Comparison: 09/19/2012 and earlier

CLINICAL DATA: The patient has undergone neoadjuvant treatment for
known right breast cancer. Surgery is scheduled for 03/29/2013.

EXAM:
ULTRASOUND RIGHT BREAST

[Series 1: ultrasound right breast · 0.08mm/px · 7 of 7 slices shown]
[im 1/7]
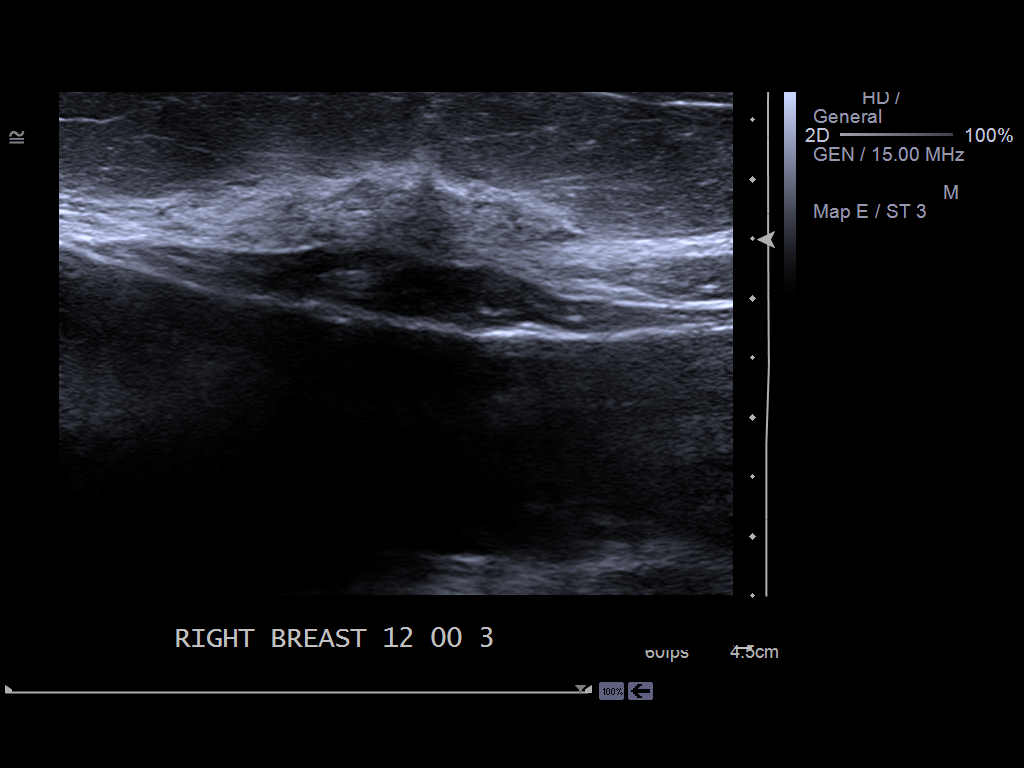
[im 2/7]
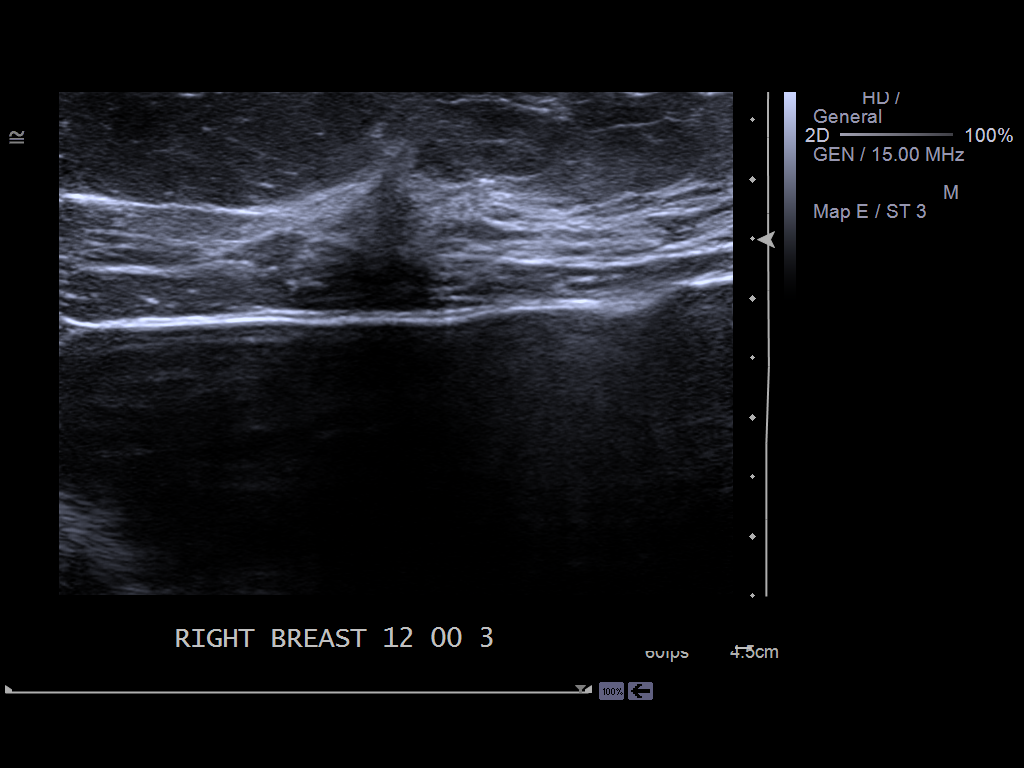
[im 3/7]
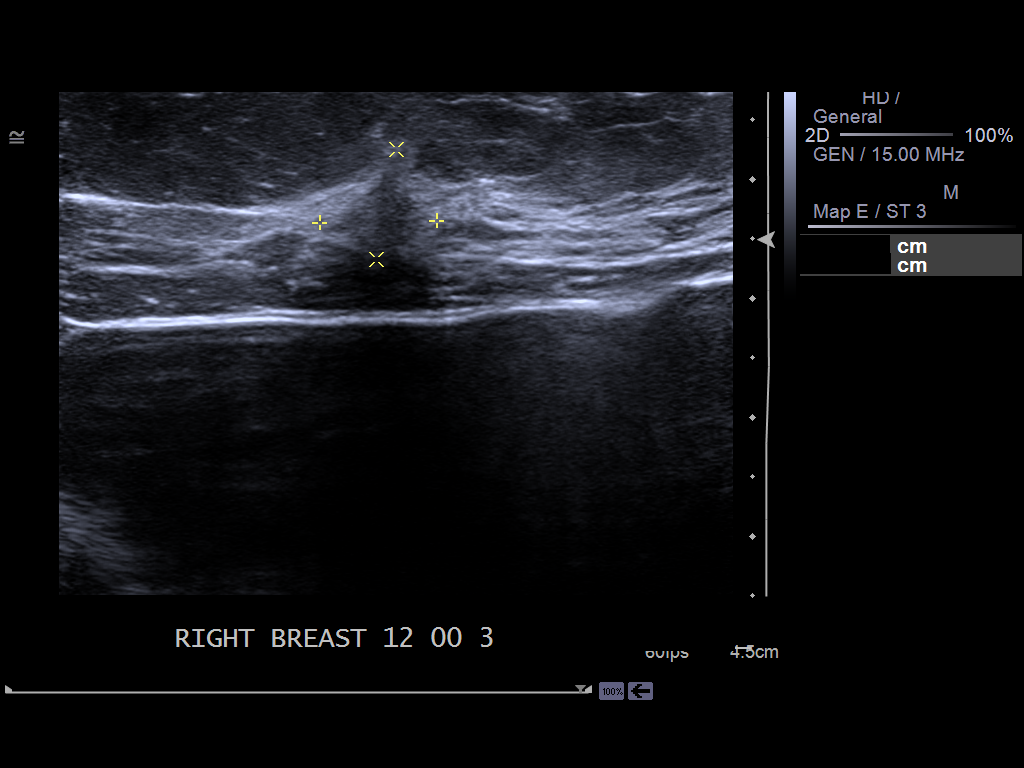
[im 4/7]
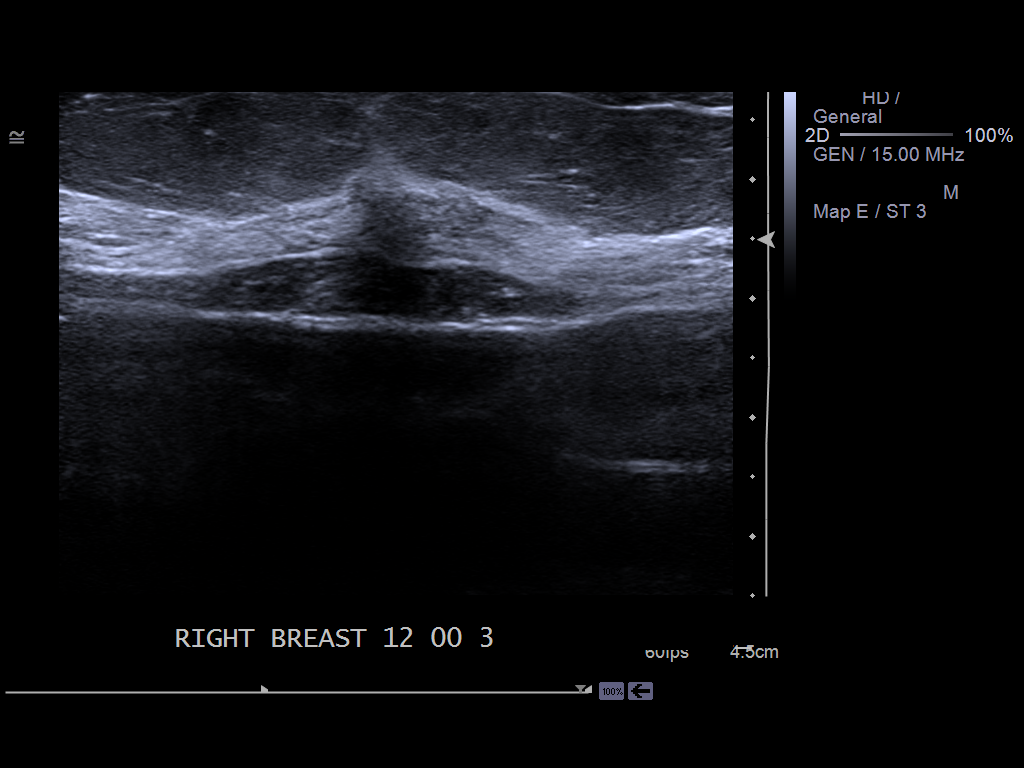
[im 5/7]
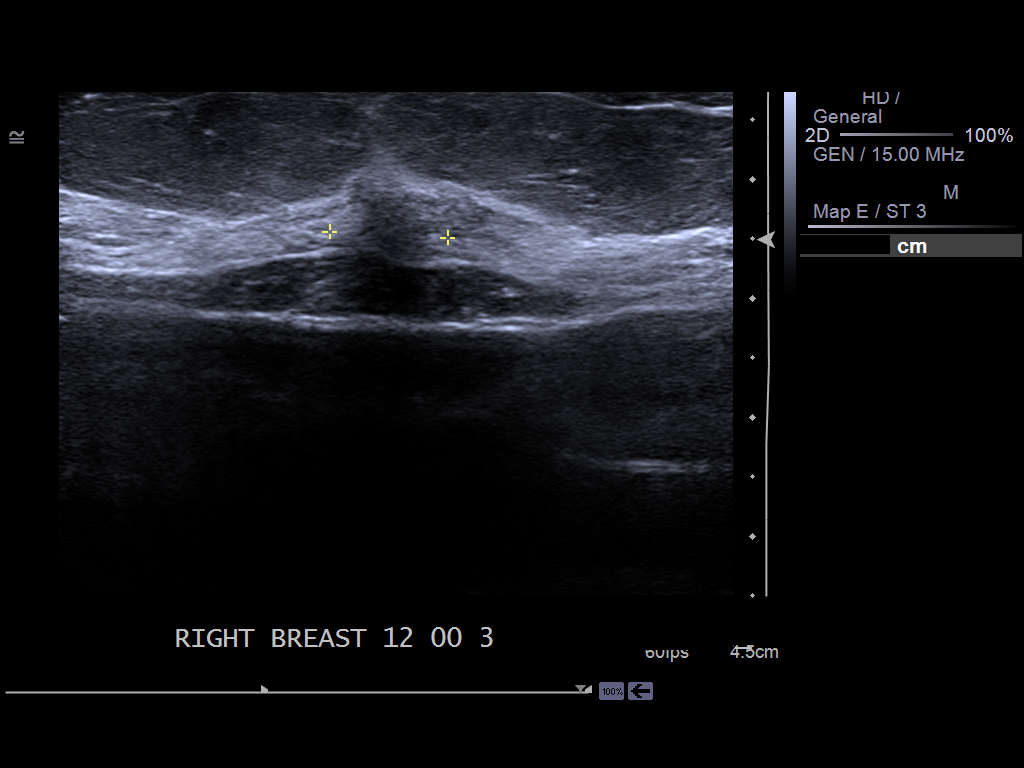
[im 6/7]
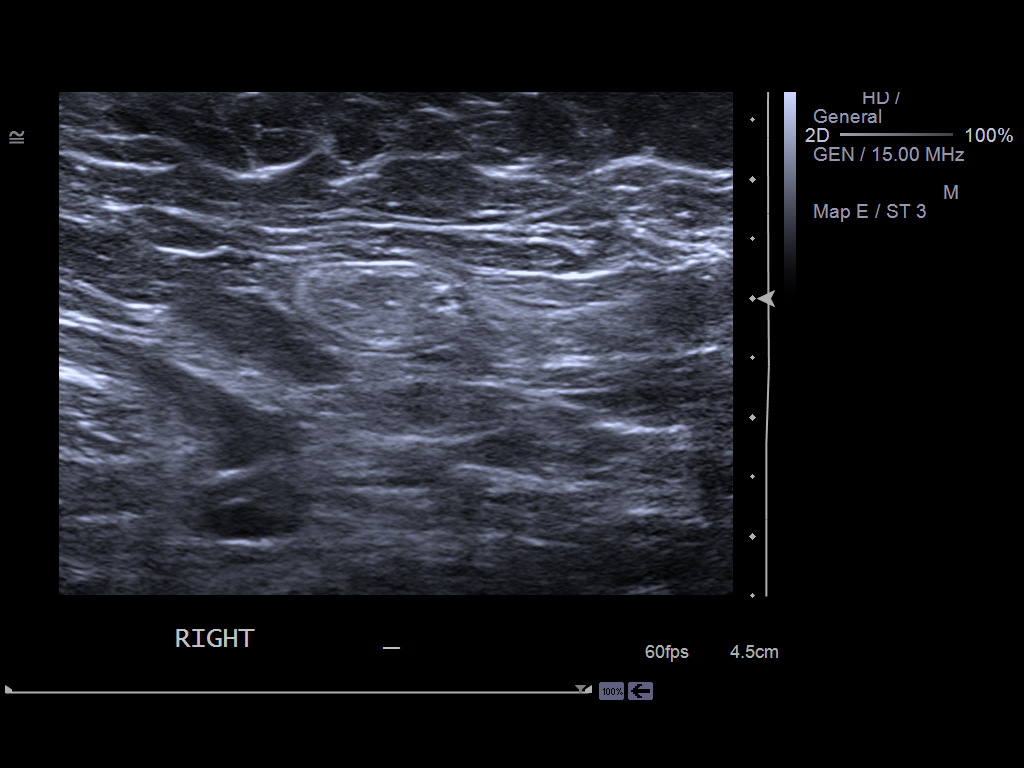
[im 7/7]
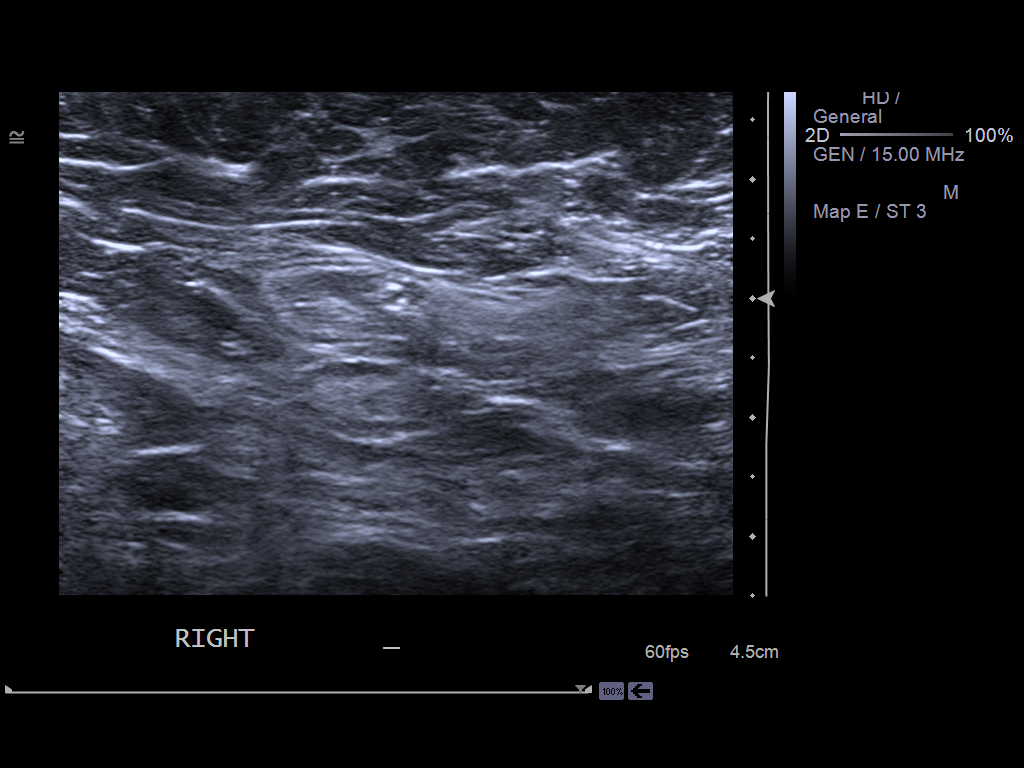

[7 of 7 positions shown; findings below may reference images not displayed]

FINDINGS: On physical exam,I palpate soft thickening in the 12 o'clock
location of the right breast, 3 cm from the nipple. The patient
reports 7 mass has diminished in size significantly on
chemotherapy..

Ultrasound is performed, showing vague area of hypoechoic tissue in
the 12 o'clock location of the right breast corresponding to the
palpable thickening. Mass is now significantly smaller, measuring
1.0 x 1.0 x 0.9 cm.

A skin mark is placed over the lesion as identified by ultrasound.
This site is marked while the patient's right arm is extended over
her head.

Evaluation of the right axilla is negative for adenopathy.
IMPRESSION: 1. Smaller right breast mass following neoadjuvant treatment.
2. Skin over the lesion is marked for localization at the time of
surgery.
3. The findings were discussed with ROSELENE TARIQ on 03/22/2013 at
[DATE].

RECOMMENDATION:
Treatment plan

I have discussed the findings and recommendations with the patient.
Results were also provided in writing at the conclusion of the
visit. If applicable, a reminder letter will be sent to the patient
regarding the next appointment.

BI-RADS CATEGORY  6: Known biopsy-proven malignancy - appropriate
action should be taken.

## 2015-09-02 ENCOUNTER — Telehealth: Payer: Self-pay | Admitting: *Deleted

## 2015-09-02 NOTE — Telephone Encounter (Signed)
Pt has questions about Brilinta 90 mg she has recently changed pharmacy and they will be contacting us for a New Rx. She mentioned that Dr. Rockey Situ mentioned at last OV that he would possibly reduce her Brilinta to 60 mg tablet. She does not want [place] to send in the Rx for 90 mg tablet if he is going to make that change by the time of here next appointment 09/15/15. Please advise pt for she is in question and due to having multiple Rx medications refilled by [facility] she does not want Brilinta to be the cause as to why she does not receive her other medications.

## 2015-09-04 MED ORDER — TICAGRELOR 60 MG PO TABS: 60.0000 mg | ORAL_TABLET | Freq: Two times a day (BID) | ORAL | Status: DC

## 2015-09-04 NOTE — Telephone Encounter (Signed)
I would decrease brilinta down to 60 mg twice a day We decrease the dose after one year following stent placement

## 2015-09-04 NOTE — Telephone Encounter (Signed)
Spoke w/ pt.  Advised her of Dr. Donivan Scull recommendation.  She is agreeable and appreciative. She asks that I not send rx into pharmacy, that [facility] sent over paperwork to be completed that and that it should just need Dr. Donivan Scull signature. She reports that Lenda Kelp is familiar w/ what she needs and asks that I make her aware.

## 2015-09-07 ENCOUNTER — Other Ambulatory Visit: Payer: Self-pay | Admitting: Physician Assistant

## 2015-09-08 ENCOUNTER — Telehealth: Payer: Self-pay | Admitting: Internal Medicine

## 2015-09-08 ENCOUNTER — Telehealth: Payer: Self-pay | Admitting: Cardiovascular Disease

## 2015-09-08 NOTE — Telephone Encounter (Signed)
Paperwork placed in Dr. Donivan Scull To Be Signed folder.

## 2015-09-08 NOTE — Telephone Encounter (Signed)
Patient has [employer] form for gollan to sign.  Please CALL WHEN READY FOR PICK UP .  She has to fax with other forms to company.  If no Answer lmov.  Forms placed in nurse box.

## 2015-09-08 NOTE — Telephone Encounter (Signed)
Pt dropped off [employer] paper work to be completed by Dr. Nicki Reaper. Papers are in Dr. Bary Leriche box.

## 2015-09-09 NOTE — Telephone Encounter (Signed)
Placed in red folder  

## 2015-09-09 NOTE — Telephone Encounter (Signed)
Forms signed and placed in your box.

## 2015-09-09 NOTE — Telephone Encounter (Signed)
Forms placed up front & pt notified

## 2015-09-10 NOTE — Telephone Encounter (Signed)
Left message for pt that completed paperwork is at the front desk for her to p/u at her convenience.  Asked her to call back w/ any questions or concerns.

## 2015-09-15 ENCOUNTER — Encounter: Payer: Self-pay | Admitting: Cardiovascular Disease

## 2015-09-15 ENCOUNTER — Ambulatory Visit (INDEPENDENT_AMBULATORY_CARE_PROVIDER_SITE_OTHER): Payer: Medicare Other | Admitting: Cardiovascular Disease

## 2015-09-15 VITALS — BP 120/70 | HR 78 | Ht 66.0 in | Wt 204.2 lb

## 2015-09-15 DIAGNOSIS — I2119 ST elevation (STEMI) myocardial infarction involving other coronary artery of inferior wall: Secondary | ICD-10-CM | POA: Diagnosis not present

## 2015-09-15 DIAGNOSIS — I6522 Occlusion and stenosis of left carotid artery: Secondary | ICD-10-CM | POA: Diagnosis not present

## 2015-09-15 DIAGNOSIS — E1159 Type 2 diabetes mellitus with other circulatory complications: Secondary | ICD-10-CM | POA: Insufficient documentation

## 2015-09-15 DIAGNOSIS — I10 Essential (primary) hypertension: Secondary | ICD-10-CM | POA: Diagnosis not present

## 2015-09-15 DIAGNOSIS — Z720 Tobacco use: Secondary | ICD-10-CM

## 2015-09-15 DIAGNOSIS — E1165 Type 2 diabetes mellitus with hyperglycemia: Secondary | ICD-10-CM

## 2015-09-15 DIAGNOSIS — I2510 Atherosclerotic heart disease of native coronary artery without angina pectoris: Secondary | ICD-10-CM

## 2015-09-15 DIAGNOSIS — E7800 Pure hypercholesterolemia, unspecified: Secondary | ICD-10-CM

## 2015-09-15 MED ORDER — ROSUVASTATIN CALCIUM 5 MG PO TABS: 5.0000 mg | ORAL_TABLET | Freq: Every day | ORAL | Status: DC

## 2015-09-15 MED ORDER — ROSUVASTATIN CALCIUM 10 MG PO TABS: 10.0000 mg | ORAL_TABLET | Freq: Every day | ORAL | Status: DC

## 2015-09-15 NOTE — Assessment & Plan Note (Signed)
Denies having symptoms concerning for angina but certainly at high risk of worsening coronary disease.  No further testing at this time

## 2015-09-15 NOTE — Assessment & Plan Note (Signed)
50-69% carotid stenosis on the left in 2015  high risk of worsening disease as risk factors are not well controlled, discussed with her in detail

## 2015-09-15 NOTE — Assessment & Plan Note (Signed)
Strongly recommended a change in diet, weight loss, exercise program   Total encounter time more than 25 minutes  Greater than 50% was spent in counseling and coordination of care with the patient

## 2015-09-15 NOTE — Assessment & Plan Note (Signed)
Recommended she advance her Crestor as much as tolerated, would consider taking this daily 5 mg up to 10 mg if tolerated.  May need to add zetia,  But she has declined in the past  goal LDL less than 70

## 2015-09-15 NOTE — Assessment & Plan Note (Signed)
Blood pressure is well controlled on today's visit. No changes made to the medications. 

## 2015-09-15 NOTE — Patient Instructions (Addendum)
You are doing well. No medication changes were made.  Please increase the crestor as tolerated.  Please call us if you have new issues that need to be addressed before your next appt.  Your physician wants you to follow-up in: 6 months.  You will receive a reminder letter in the mail two months in advance. If you don't receive a letter, please call our office to schedule the follow-up appointment.

## 2015-09-15 NOTE — Assessment & Plan Note (Signed)
We have encouraged her to continue to work on weaning her cigarettes and smoking cessation. She will continue to work on this and does not want any assistance with chantix.  

## 2015-09-15 NOTE — Assessment & Plan Note (Signed)
She will stay on aspirin and brilinta  At lower dose, 60 mg twice a day

## 2015-09-15 NOTE — Progress Notes (Signed)
Patient ID: Haley Santos, female    DOB: [birth year], 70 y.o.   MRN: [id number]  HPI Comments: [name] is a 70 yo woman with obesity, long history of smoking who continues to smoke, hyperlipidemia, intolerant of statins, GERD, hypertension with moderate carotid arterial disease on the left, history of STEMI, who presents for routine follow-up of her coronary artery disease Patient reports history of Crohn's  April 2016 she developed acute chest pain, bilateral arm pain, neck pain/jaw pain bilaterally.  She had cardiac catheterization, essentially occluded mid RCA with 50-70% mid LAD disease, DES placed the RCA. History of breast cancer, Adriamycin through port in the left chest Carotid ultrasound with 50-69% carotid stenosis  In follow-up today, she reports that she is working on her diet, has had difficulty with weight loss, trying to eat less sugar Overall feels well with no complaints, no chest pain concerning for angina She continues to smoke, Having difficulty controlling her diabetes Was off her cholesterol medication, now takes Crestor days per week  EKG on today's visit shows normal sinus rhythm with rate 79 bpm, no significant ST or T-wave changes  Other past medical history She has seen [facility] in the past and unable to tolerate statin secondary to myalgias. She reports a history of MS  Review of her carotid ultrasound with her shows 50-69% disease on the left Review of her lab work with her shows total cholesterol 260. Elevated hemoglobin A1c greater than 6    Allergies  Allergen Reactions  . Statins Other (See Comments)    Muscle weakness/pain. Trials of multiple agents.  . Clindamycin/Lincomycin   . Codeine   . Shellfish Allergy Nausea And Vomiting    Felt goofy headed  . Valium [Diazepam]     Current Outpatient Prescriptions on File Prior to Visit  Medication Sig Dispense Refill  . ADVAIR DISKUS 250-50 MCG/DOSE AEPB INHALE ONE DOSE BY MOUTH TWICE  DAILY* RINSE MOUTH AFTER EACH USE* 60 each 5  . aspirin 81 MG chewable tablet Chew 1 tablet (81 mg total) by mouth daily.    Marland Kitchen latanoprost (XALATAN) 0.005 % ophthalmic solution Place 1-2 drops into both eyes daily.    Marland Kitchen levothyroxine (SYNTHROID, LEVOTHROID) 112 MCG tablet Take 1 tablet (112 mcg total) by mouth daily. 90 tablet 3  . metoprolol tartrate (LOPRESSOR) 25 MG tablet TAKE ONE-HALF TABLET BY MOUTH TWICE DAILY 60 tablet 6  . omeprazole (PRILOSEC OTC) 20 MG tablet Take 20 mg by mouth daily.    . rosuvastatin (CRESTOR) 5 MG tablet Take 5 mg by mouth at bedtime.     . ticagrelor (BRILINTA) 60 MG TABS tablet Take 1 tablet (60 mg total) by mouth 2 (two) times daily. 180 tablet 3  . VENTOLIN HFA 108 (90 BASE) MCG/ACT inhaler INHALE TWO PUFFS BY MOUTH EVERY 6 HOURS AS NEEDED FOR WHEEZING FOR SHORTNESS OF BREATH 54 each 0   No current facility-administered medications on file prior to visit.    Past Medical History  Diagnosis Date  . Asthma with bronchitis   . Multiple sclerosis ([place])   . Hyperthyroidism     a. s/p ablation maintained on synthroid  . GERD (gastroesophageal reflux disease)   . Glaucoma   . Hypercholesterolemia   . Breast cancer ([place])   . Crohn's disease ([place])   . Tobacco abuse   . Glucose intolerance (impaired glucose tolerance)     a. HgA1c 6.2 during 08/2014 admission   . CAD (coronary artery disease)  a. 08/2014: inf STEMI s/p DES to RCA.     Past Surgical History  Procedure Laterality Date  . Tonsillectomy and adenoidectomy  1957  . Right oophorectomy  1979  . Colonoscopy  2010    Dr Vira Agar  . Breast enhancement surgery  [place]  . Portacath placement  11/07/2012  . Left heart catheterization with coronary angiogram N/A 09/01/2014    Procedure: LEFT HEART CATHETERIZATION WITH CORONARY ANGIOGRAM;  Surgeon: Peter M Martinique, MD;  Location: [facility] C[facility];  Service: Cardiovascular;  Laterality: N/A;  . Augmentation mammaplasty Left     Social  History  reports that she quit smoking about 2 months ago. She has never used smokeless tobacco. She reports that she drinks alcohol. She reports that she does not use illicit drugs.  Family History family history includes Diabetes in her mother; Glaucoma in her mother; Haley Santos' disease in her mother; Heart attack (age of onset: 73) in her mother; Heart disease in her mother; Lung disease in her father.  Lab Results  Component Value Date   CHOL 296* 08/04/2015   HDL 44.20 08/04/2015   LDLCALC 189* 09/02/2014   TRIG 237.0* 08/04/2015    Review of Systems  Constitutional: Negative.   Respiratory: Negative.   Cardiovascular: Negative.   Gastrointestinal: Negative.   Musculoskeletal: Negative.   Skin: Negative.   Neurological: Negative.   Hematological: Negative.   Psychiatric/Behavioral: Negative.   All other systems reviewed and are negative.   BP 120/70 mmHg  Pulse 78  Ht 5\' 6"  (1.676 m)  Wt 204 lb 4 oz (92.647 kg)  BMI 32.98 kg/m2  LMP 06/15/1977  Physical Exam  Constitutional: She is oriented to person, place, and time. She appears well-developed and well-nourished.  Obese  HENT:  Head: Normocephalic.  Nose: Nose normal.  Mouth/Throat: Oropharynx is clear and moist.  Eyes: Conjunctivae are normal. Pupils are equal, round, and reactive to light.  Neck: Normal range of motion. Neck supple. No JVD present.  Cardiovascular: Normal rate, regular rhythm, S1 normal, S2 normal, normal heart sounds and intact distal pulses.  Exam reveals no gallop and no friction rub.   No murmur heard. Pulmonary/Chest: Effort normal and breath sounds normal. No respiratory distress. She has no wheezes. She has no rales. She exhibits no tenderness.  Abdominal: Soft. Bowel sounds are normal. She exhibits no distension. There is no tenderness.  Musculoskeletal: Normal range of motion. She exhibits no edema or tenderness.  Lymphadenopathy:    She has no cervical adenopathy.  Neurological: She is  alert and oriented to person, place, and time. Coordination normal.  Skin: Skin is warm and dry. No rash noted. No erythema.  Psychiatric: She has a normal mood and affect. Her behavior is normal. Judgment and thought content normal.    Assessment and Plan  Nursing note and vitals reviewed.

## 2015-10-08 ENCOUNTER — Telehealth: Payer: Self-pay | Admitting: Internal Medicine

## 2015-10-08 MED ORDER — ALBUTEROL SULFATE HFA 108 (90 BASE) MCG/ACT IN AERS: INHALATION_SPRAY | RESPIRATORY_TRACT | Status: DC

## 2015-10-08 MED ORDER — FLUTICASONE-SALMETEROL 250-50 MCG/DOSE IN AEPB: INHALATION_SPRAY | RESPIRATORY_TRACT | Status: DC

## 2015-10-08 NOTE — Telephone Encounter (Signed)
Left message on machine for patient - rx ready for pick up 

## 2015-10-08 NOTE — Telephone Encounter (Signed)
Pt needs  2 subscriptions written on Dr. Bary Leriche prescription pad,  ADVAIR DISKUS 250-50 MCG/DOSE AEPB, amount 3 with 3 refills, plus instructions.  VENTOLIN HFA 108 (90 BASE) MCG/ACT inhaler with instructions, amount 3, 3 refills. Pt will pick up proscriptions so she can mail out herself. Please call when ready. This is for medication assistant, through [employer], they are giving pt a hard time, that is why she needs them written out.

## 2015-10-08 NOTE — Telephone Encounter (Signed)
Ok to print, but just do months with one refill on each (so 6 months total).  Thanks

## 2015-10-08 NOTE — Telephone Encounter (Signed)
Okay to print

## 2015-10-20 ENCOUNTER — Encounter: Payer: Self-pay | Admitting: Internal Medicine

## 2015-11-03 ENCOUNTER — Telehealth: Payer: Self-pay | Admitting: Internal Medicine

## 2015-11-03 NOTE — Telephone Encounter (Signed)
Dr. Nicki Reaper took care of it already.

## 2015-11-03 NOTE — Telephone Encounter (Signed)
Pt called about needing her Rx form to be filled out so she can get help with her Rx's for 90 days with addl refills to cover for the year. Pt states she can't wait until next week. Pt states put the information on a Rx pad with dr original  signature.   Medications are Fluticasone-Salmeterol (ADVAIR DISKUS) 250-50 MCG/DOSE AEPB, albuterol (VENTOLIN HFA) 108 (90 Base) MCG/ACT inhaler.   Call pt @ [phone]. [place]!

## 2015-11-09 ENCOUNTER — Other Ambulatory Visit (INDEPENDENT_AMBULATORY_CARE_PROVIDER_SITE_OTHER): Payer: Medicare Other

## 2015-11-09 DIAGNOSIS — E7800 Pure hypercholesterolemia, unspecified: Secondary | ICD-10-CM

## 2015-11-09 DIAGNOSIS — R739 Hyperglycemia, unspecified: Secondary | ICD-10-CM | POA: Diagnosis not present

## 2015-11-09 DIAGNOSIS — I10 Essential (primary) hypertension: Secondary | ICD-10-CM | POA: Diagnosis not present

## 2015-11-09 LAB — HEMOGLOBIN A1C: Hgb A1c MFr Bld: 6.5 % (ref 4.6–6.5)

## 2015-11-09 LAB — BASIC METABOLIC PANEL
BUN: 12 mg/dL (ref 6–23)
CHLORIDE: 105 meq/L (ref 96–112)
CO2: 29 mEq/L (ref 19–32)
CREATININE: 0.84 mg/dL (ref 0.40–1.20)
Calcium: 9.4 mg/dL (ref 8.4–10.5)
GFR: 71.68 mL/min (ref >60.00)
GLUCOSE: 125 mg/dL — AB (ref 70–99)
Potassium: 4.4 mEq/L (ref 3.5–5.1)
Sodium: 140 mEq/L (ref 135–145)

## 2015-11-09 LAB — HEPATIC FUNCTION PANEL
ALBUMIN: 4 g/dL (ref 3.5–5.2)
ALK PHOS: 85 U/L (ref 39–117)
ALT: 18 U/L (ref 0–35)
AST: 18 U/L (ref 0–37)
BILIRUBIN DIRECT: 0.1 mg/dL (ref 0.0–0.3)
Total Bilirubin: 0.5 mg/dL (ref 0.2–1.2)
Total Protein: 6.8 g/dL (ref 6.0–8.3)

## 2015-11-09 LAB — LIPID PANEL
Cholesterol: 171 mg/dL (ref 0–200)
HDL: 43.7 mg/dL (ref >39.00)
LDL Cholesterol: 91 mg/dL (ref 0–99)
NONHDL: 127.54
TRIGLYCERIDES: 181 mg/dL — AB (ref 0.0–149.0)
Total CHOL/HDL Ratio: 4
VLDL: 36.2 mg/dL (ref 0.0–40.0)

## 2015-11-09 LAB — MICROALBUMIN / CREATININE URINE RATIO
Creatinine,U: 114.7 mg/dL
MICROALB UR: 0.8 mg/dL (ref 0.0–1.9)
Microalb Creat Ratio: 0.7 mg/g (ref 0.0–30.0)

## 2015-11-10 ENCOUNTER — Encounter: Payer: Self-pay | Admitting: Internal Medicine

## 2015-11-12 ENCOUNTER — Encounter: Payer: Self-pay | Admitting: Internal Medicine

## 2015-11-12 ENCOUNTER — Ambulatory Visit (INDEPENDENT_AMBULATORY_CARE_PROVIDER_SITE_OTHER): Payer: Medicare Other | Admitting: Internal Medicine

## 2015-11-12 VITALS — BP 120/78 | HR 77 | Temp 98.3°F | Resp 17 | Ht 66.0 in | Wt 205.0 lb

## 2015-11-12 DIAGNOSIS — I6522 Occlusion and stenosis of left carotid artery: Secondary | ICD-10-CM | POA: Diagnosis not present

## 2015-11-12 DIAGNOSIS — E038 Other specified hypothyroidism: Secondary | ICD-10-CM

## 2015-11-12 DIAGNOSIS — C50411 Malignant neoplasm of upper-outer quadrant of right female breast: Secondary | ICD-10-CM

## 2015-11-12 DIAGNOSIS — I2510 Atherosclerotic heart disease of native coronary artery without angina pectoris: Secondary | ICD-10-CM | POA: Diagnosis not present

## 2015-11-12 DIAGNOSIS — E7800 Pure hypercholesterolemia, unspecified: Secondary | ICD-10-CM | POA: Diagnosis not present

## 2015-11-12 DIAGNOSIS — E119 Type 2 diabetes mellitus without complications: Secondary | ICD-10-CM | POA: Diagnosis not present

## 2015-11-12 DIAGNOSIS — K50919 Crohn's disease, unspecified, with unspecified complications: Secondary | ICD-10-CM

## 2015-11-12 DIAGNOSIS — I6523 Occlusion and stenosis of bilateral carotid arteries: Secondary | ICD-10-CM | POA: Diagnosis not present

## 2015-11-12 DIAGNOSIS — G35 Multiple sclerosis: Secondary | ICD-10-CM | POA: Diagnosis not present

## 2015-11-12 DIAGNOSIS — Z720 Tobacco use: Secondary | ICD-10-CM

## 2015-11-12 DIAGNOSIS — E785 Hyperlipidemia, unspecified: Secondary | ICD-10-CM | POA: Diagnosis not present

## 2015-11-12 DIAGNOSIS — E669 Obesity, unspecified: Secondary | ICD-10-CM | POA: Diagnosis not present

## 2015-11-12 DIAGNOSIS — I10 Essential (primary) hypertension: Secondary | ICD-10-CM

## 2015-11-12 DIAGNOSIS — K219 Gastro-esophageal reflux disease without esophagitis: Secondary | ICD-10-CM

## 2015-11-12 DIAGNOSIS — M79609 Pain in unspecified limb: Secondary | ICD-10-CM | POA: Diagnosis not present

## 2015-11-12 MED ORDER — ALBUTEROL SULFATE HFA 108 (90 BASE) MCG/ACT IN AERS: INHALATION_SPRAY | RESPIRATORY_TRACT | Status: DC

## 2015-11-12 MED ORDER — FLUTICASONE-SALMETEROL 250-50 MCG/DOSE IN AEPB: INHALATION_SPRAY | RESPIRATORY_TRACT | Status: DC

## 2015-11-12 NOTE — Progress Notes (Signed)
Pre-visit discussion using our clinic review tool. No additional management support is needed unless otherwise documented below in the visit note.  

## 2015-11-12 NOTE — Telephone Encounter (Signed)
Unread mychart message mailed to patient 

## 2015-11-12 NOTE — Progress Notes (Signed)
Patient ID: Haley Santos, female   DOB: 1946/04/11, 70 y.o.   MRN: [id number]   Subjective:    Patient ID: Haley Santos, female    DOB: Jan 16, 1946, 70 y.o.   MRN: [id number]  HPI  Patient here for a scheduled follow up.  She is feeling better. Working on her diet.  Trying to stay physically active.  Works out in her yard.  Has noticed some numbness - left arm.  Desires no further w/up intervention or w/up.  Also reports some aching and feeling muscles weaker.  Discussed physical therapy.  She wants to postpone at this time.  Also reports some chest pain.  Intermittent.  States better when is more active.  Breathing stable.  No acid reflux.  No abdominal pain or cramping.  Bowels stable.     Past Medical History  Diagnosis Date  . Asthma with bronchitis   . Multiple sclerosis ([place])   . Hyperthyroidism     a. s/p ablation maintained on synthroid  . GERD (gastroesophageal reflux disease)   . Glaucoma   . Hypercholesterolemia   . Breast cancer ([place])   . Crohn's disease ([place])   . Tobacco abuse   . Glucose intolerance (impaired glucose tolerance)     a. HgA1c 6.2 during 08/2014 admission   . CAD (coronary artery disease)     a. 08/2014: inf STEMI s/p DES to RCA.    Past Surgical History  Procedure Laterality Date  . Tonsillectomy and adenoidectomy  1957  . Right oophorectomy  1979  . Colonoscopy  2010    Dr Vira Agar  . Breast enhancement surgery  [place]  . Portacath placement  11/07/2012  . Left heart catheterization with coronary angiogram N/A 09/01/2014    Procedure: LEFT HEART CATHETERIZATION WITH CORONARY ANGIOGRAM;  Surgeon: Peter M Martinique, MD;  Location: [facility] CATH LAB;  Service: Cardiovascular;  Laterality: N/A;  . Augmentation mammaplasty Left    Family History  Problem Relation Age of Onset  . Diabetes Mother   . Heart disease Mother     myocardial infarction  . Glaucoma Mother   . Graves' disease Mother   . Heart attack Mother 3  . Lung disease  Father   . Heart disease      half sister  . Peripheral vascular disease      half sister   Social History   Social History  . Marital Status: Divorced    Spouse Name: N/A  . Number of Children: 1  . Years of Education: N/A   Social History Main Topics  . Smoking status: Former Smoker -- 0.00 packs/day for 40 years    Quit date: 07/01/2015  . Smokeless tobacco: Never Used  . Alcohol Use: 0.0 oz/week    0 Standard drinks or equivalent per week  . Drug Use: No  . Sexual Activity: Not Asked   Other Topics Concern  . None   Social History Narrative    Outpatient Encounter Prescriptions as of 11/12/2015  Medication Sig  . albuterol (VENTOLIN HFA) 108 (90 Base) MCG/ACT inhaler INHALE TWO PUFFS BY MOUTH EVERY 6 HOURS AS NEEDED FOR WHEEZING FOR SHORTNESS OF BREATH  . aspirin 81 MG chewable tablet Chew 1 tablet (81 mg total) by mouth daily.  . Fluticasone-Salmeterol (ADVAIR DISKUS) 250-50 MCG/DOSE AEPB INHALE ONE DOSE BY MOUTH TWICE DAILY* RINSE MOUTH AFTER EACH USE*  . latanoprost (XALATAN) 0.005 % ophthalmic solution Place 1-2 drops into both eyes daily.  Marland Kitchen  levothyroxine (SYNTHROID, LEVOTHROID) 112 MCG tablet Take 1 tablet (112 mcg total) by mouth daily.  . metoprolol tartrate (LOPRESSOR) 25 MG tablet TAKE ONE-HALF TABLET BY MOUTH TWICE DAILY  . omeprazole (PRILOSEC OTC) 20 MG tablet Take 20 mg by mouth daily.  . ticagrelor (BRILINTA) 60 MG TABS tablet Take 1 tablet (60 mg total) by mouth 2 (two) times daily.  . [DISCONTINUED] albuterol (VENTOLIN HFA) 108 (90 Base) MCG/ACT inhaler INHALE TWO PUFFS BY MOUTH EVERY 6 HOURS AS NEEDED FOR WHEEZING FOR SHORTNESS OF BREATH  . [DISCONTINUED] albuterol (VENTOLIN HFA) 108 (90 Base) MCG/ACT inhaler INHALE TWO PUFFS BY MOUTH EVERY 6 HOURS AS NEEDED FOR WHEEZING FOR SHORTNESS OF BREATH  . [DISCONTINUED] albuterol (VENTOLIN HFA) 108 (90 Base) MCG/ACT inhaler INHALE TWO PUFFS BY MOUTH EVERY 6 HOURS AS NEEDED FOR WHEEZING FOR SHORTNESS OF BREATH  .  [DISCONTINUED] albuterol (VENTOLIN HFA) 108 (90 Base) MCG/ACT inhaler INHALE TWO PUFFS BY MOUTH EVERY 6 HOURS AS NEEDED FOR WHEEZING FOR SHORTNESS OF BREATH  . [DISCONTINUED] Fluticasone-Salmeterol (ADVAIR DISKUS) 250-50 MCG/DOSE AEPB INHALE ONE DOSE BY MOUTH TWICE DAILY* RINSE MOUTH AFTER EACH USE*  . [DISCONTINUED] Fluticasone-Salmeterol (ADVAIR DISKUS) 250-50 MCG/DOSE AEPB INHALE ONE DOSE BY MOUTH TWICE DAILY* RINSE MOUTH AFTER EACH USE*  . [DISCONTINUED] Fluticasone-Salmeterol (ADVAIR DISKUS) 250-50 MCG/DOSE AEPB INHALE ONE DOSE BY MOUTH TWICE DAILY* RINSE MOUTH AFTER EACH USE*  . [DISCONTINUED] rosuvastatin (CRESTOR) 10 MG tablet Take 1 tablet (10 mg total) by mouth at bedtime.  . CRESTOR 5 MG tablet Take 5 mg by mouth daily.   No facility-administered encounter medications on file as of 11/12/2015.    Review of Systems  Constitutional: Negative for appetite change and unexpected weight change.  HENT: Negative for congestion and sinus pressure.   Respiratory: Negative for cough and shortness of breath.   Cardiovascular: Positive for chest pain. Negative for palpitations and leg swelling.  Gastrointestinal: Negative for nausea, vomiting, abdominal pain and diarrhea.  Genitourinary: Negative for dysuria and difficulty urinating.  Musculoskeletal:       Some aching as outlined.  Some weakness.   Skin: Negative for color change and rash.  Neurological: Negative for dizziness, light-headedness and headaches.  Psychiatric/Behavioral: Negative for dysphoric mood and agitation.       Objective:     Blood pressure rechecked by me:  126/68  Physical Exam  Constitutional: She appears well-developed and well-nourished. No distress.  HENT:  Nose: Nose normal.  Mouth/Throat: Oropharynx is clear and moist.  Neck: Neck supple. No thyromegaly present.  Cardiovascular: Normal rate and regular rhythm.   Pulmonary/Chest: Breath sounds normal. No respiratory distress. She has no wheezes.    Abdominal: Soft. Bowel sounds are normal. There is no tenderness.  Musculoskeletal: She exhibits no edema or tenderness.  Lymphadenopathy:    She has no cervical adenopathy.  Skin: No rash noted. No erythema.  Psychiatric: She has a normal mood and affect. Her behavior is normal.    BP 120/78 mmHg  Pulse 77  Temp(Src) 98.3 F (36.8 C) (Oral)  Resp 17  Ht _0  (1.676 m)  Wt 205 lb (92.987 kg)  BMI 33.10 kg/m2  SpO2 98%  LMP 06/15/1977 Wt Readings from Last 3 Encounters:  11/12/15 205 lb (92.987 kg)  09/15/15 204 lb 4 oz (92.647 kg)  08/10/15 211 lb (95.709 kg)     Lab Results  Component Value Date   WBC 6.7 08/04/2015   HGB 12.5 08/04/2015   HCT 37.0 08/04/2015   PLT 232.0 08/04/2015  GLUCOSE 125* 11/09/2015   CHOL 171 11/09/2015   TRIG 181.0* 11/09/2015   HDL 43.70 11/09/2015   LDLDIRECT 206.0 08/04/2015   LDLCALC 91 11/09/2015   ALT 18 11/09/2015   AST 18 11/09/2015   NA 140 11/09/2015   K 4.4 11/09/2015   CL 105 11/09/2015   CREATININE 0.84 11/09/2015   BUN 12 11/09/2015   CO2 29 11/09/2015   TSH 3.22 08/04/2015   HGBA1C 6.5 11/09/2015   MICROALBUR 0.8 11/09/2015       Assessment & Plan:   Problem List Items Addressed This Visit    Breast cancer of upper-outer quadrant of right female breast [facility])    Is s/p chemotherapy.  Completed XRT.  She desires no further f/u with oncology.  Discussed due for mammogram.  She wants to schedule.        Relevant Orders   MM Digital Diagnostic Bilat   CAD (coronary artery disease)    Chest pain as outlined.  She declined EKG or any further cardiac evaluation at this time.  States feels better when more active.  Continue risk factor modification.  Follow.  Keep f/u with cardiology.        Relevant Medications   CRESTOR 5 MG tablet   Carotid artery stenosis    50-69% carotid stenosis on the left.  Being followed by cardiology.        Relevant Medications   CRESTOR 5 MG tablet   Crohn's disease ([place])    No  GI issues.        Diabetes mellitus ([place])    Low carb diet and exercise.  Follow met b and a1c.        Relevant Medications   CRESTOR 5 MG tablet   Other Relevant Orders   Hemoglobin U9W   Basic metabolic panel   GERD (gastroesophageal reflux disease)    Controlled on current medication.        Hypercholesterolemia    Cholesterol much improved.  Continue crestor.  She is unable to increase the dose.  Follow lipid panel and liver function tests.        Relevant Medications   CRESTOR 5 MG tablet   Other Relevant Orders   Hepatic function panel   Lipid panel   Hypertension - Primary    Blood pressure under good control.  Continue same medication regimen.  Follow pressures.  Follow metabolic panel.        Relevant Medications   CRESTOR 5 MG tablet   Hypothyroidism    On thyroid replacement.  Follow tsh.       Multiple sclerosis ([facility])    Some of her symptoms (numbness, weakness, etc) could be coming from a flare. She declines medication.  Declines further w/up and evaluation.        Tobacco abuse    Discussed the need to stop smoking.            Einar Pheasant, MD

## 2015-11-14 ENCOUNTER — Encounter: Payer: Self-pay | Admitting: Internal Medicine

## 2015-11-14 NOTE — Assessment & Plan Note (Signed)
Low carb diet and exercise.  Follow met b and a1c.   

## 2015-11-14 NOTE — Assessment & Plan Note (Signed)
Discussed the need to stop smoking.

## 2015-11-14 NOTE — Assessment & Plan Note (Signed)
Controlled on current medication ?

## 2015-11-14 NOTE — Assessment & Plan Note (Signed)
Cholesterol much improved.  Continue crestor.  She is unable to increase the dose.  Follow lipid panel and liver function tests.

## 2015-11-14 NOTE — Assessment & Plan Note (Signed)
Chest pain as outlined.  She declined EKG or any further cardiac evaluation at this time.  States feels better when more active.  Continue risk factor modification.  Follow.  Keep f/u with cardiology.

## 2015-11-14 NOTE — Assessment & Plan Note (Signed)
Blood pressure under good control.  Continue same medication regimen.  Follow pressures.  Follow metabolic panel.   

## 2015-11-14 NOTE — Assessment & Plan Note (Signed)
Some of her symptoms (numbness, weakness, etc) could be coming from a flare. She declines medication.  Declines further w/up and evaluation.

## 2015-11-14 NOTE — Assessment & Plan Note (Signed)
On thyroid replacement.  Follow tsh.  

## 2015-11-14 NOTE — Assessment & Plan Note (Signed)
No GI issues.

## 2015-11-14 NOTE — Assessment & Plan Note (Signed)
50-69% carotid stenosis on the left.  Being followed by cardiology.

## 2015-11-14 NOTE — Assessment & Plan Note (Signed)
Is s/p chemotherapy.  Completed XRT.  She desires no further f/u with oncology.  Discussed due for mammogram.  She wants to schedule.

## 2015-11-23 DIAGNOSIS — H401131 Primary open-angle glaucoma, bilateral, mild stage: Secondary | ICD-10-CM | POA: Diagnosis not present

## 2015-12-01 DIAGNOSIS — H401131 Primary open-angle glaucoma, bilateral, mild stage: Secondary | ICD-10-CM | POA: Diagnosis not present

## 2015-12-24 ENCOUNTER — Telehealth: Payer: Self-pay | Admitting: Internal Medicine

## 2015-12-24 DIAGNOSIS — Z1239 Encounter for other screening for malignant neoplasm of breast: Secondary | ICD-10-CM

## 2015-12-24 DIAGNOSIS — Z853 Personal history of malignant neoplasm of breast: Secondary | ICD-10-CM

## 2015-12-24 NOTE — Telephone Encounter (Signed)
Per message, needs bilateral diagnostic mammogram scheduled.  Ordered.

## 2015-12-28 ENCOUNTER — Other Ambulatory Visit: Payer: Self-pay | Admitting: Internal Medicine

## 2015-12-28 ENCOUNTER — Encounter: Payer: Self-pay | Admitting: Internal Medicine

## 2015-12-28 DIAGNOSIS — Z1239 Encounter for other screening for malignant neoplasm of breast: Secondary | ICD-10-CM

## 2015-12-28 DIAGNOSIS — Z853 Personal history of malignant neoplasm of breast: Secondary | ICD-10-CM

## 2016-01-12 ENCOUNTER — Other Ambulatory Visit: Payer: Self-pay | Admitting: Internal Medicine

## 2016-01-12 DIAGNOSIS — Z853 Personal history of malignant neoplasm of breast: Secondary | ICD-10-CM

## 2016-01-13 ENCOUNTER — Ambulatory Visit
Admission: RE | Admit: 2016-01-13 | Discharge: 2016-01-13 | Disposition: A | Discharge status: Home / Self Care | Payer: Medicare Other | Source: Ambulatory Visit | Attending: Internal Medicine | Admitting: Internal Medicine

## 2016-01-13 DIAGNOSIS — Z853 Personal history of malignant neoplasm of breast: Secondary | ICD-10-CM

## 2016-01-13 DIAGNOSIS — R922 Inconclusive mammogram: Secondary | ICD-10-CM | POA: Diagnosis not present

## 2016-01-13 DIAGNOSIS — Z1239 Encounter for other screening for malignant neoplasm of breast: Secondary | ICD-10-CM

## 2016-01-13 DIAGNOSIS — N6489 Other specified disorders of breast: Secondary | ICD-10-CM | POA: Diagnosis not present

## 2016-01-21 ENCOUNTER — Encounter: Payer: Self-pay | Admitting: Internal Medicine

## 2016-02-09 ENCOUNTER — Other Ambulatory Visit: Payer: Medicare Other

## 2016-02-12 ENCOUNTER — Ambulatory Visit: Payer: Medicare Other | Admitting: Internal Medicine

## 2016-02-25 ENCOUNTER — Ambulatory Visit (INDEPENDENT_AMBULATORY_CARE_PROVIDER_SITE_OTHER): Payer: Medicare Other | Admitting: Internal Medicine

## 2016-02-25 ENCOUNTER — Encounter: Payer: Self-pay | Admitting: Internal Medicine

## 2016-02-25 DIAGNOSIS — E1159 Type 2 diabetes mellitus with other circulatory complications: Secondary | ICD-10-CM | POA: Diagnosis not present

## 2016-02-25 DIAGNOSIS — I2510 Atherosclerotic heart disease of native coronary artery without angina pectoris: Secondary | ICD-10-CM

## 2016-02-25 DIAGNOSIS — K219 Gastro-esophageal reflux disease without esophagitis: Secondary | ICD-10-CM

## 2016-02-25 DIAGNOSIS — I2119 ST elevation (STEMI) myocardial infarction involving other coronary artery of inferior wall: Secondary | ICD-10-CM

## 2016-02-25 DIAGNOSIS — E1165 Type 2 diabetes mellitus with hyperglycemia: Secondary | ICD-10-CM

## 2016-02-25 DIAGNOSIS — E038 Other specified hypothyroidism: Secondary | ICD-10-CM | POA: Diagnosis not present

## 2016-02-25 DIAGNOSIS — I6522 Occlusion and stenosis of left carotid artery: Secondary | ICD-10-CM | POA: Diagnosis not present

## 2016-02-25 DIAGNOSIS — E119 Type 2 diabetes mellitus without complications: Secondary | ICD-10-CM

## 2016-02-25 DIAGNOSIS — G35 Multiple sclerosis: Secondary | ICD-10-CM

## 2016-02-25 DIAGNOSIS — E7800 Pure hypercholesterolemia, unspecified: Secondary | ICD-10-CM

## 2016-02-25 DIAGNOSIS — I10 Essential (primary) hypertension: Secondary | ICD-10-CM

## 2016-02-25 DIAGNOSIS — J4520 Mild intermittent asthma, uncomplicated: Secondary | ICD-10-CM

## 2016-02-25 DIAGNOSIS — C50411 Malignant neoplasm of upper-outer quadrant of right female breast: Secondary | ICD-10-CM

## 2016-02-25 NOTE — Progress Notes (Signed)
Patient ID: [name], female   DOB: [birth year], 70 y.o.   MRN: [id number]   Subjective:    Patient ID: [name], female    DOB: [birth year], 70 y.o.   MRN: [id number]  HPI  Patient here for a scheduled follow up.  She reports she is doing relatively well.  Tries to stay active.  No chest pain.  No sob.  Breathing overall stable.  No acid reflux.  No abdominal pain or cramping.  Bowels stable.  Handling stress relatively well.  MS - stable.  Desires no further treatment or w/up.     Past Medical History:  Diagnosis Date  . Asthma with bronchitis   . Breast cancer ([place]) 2014  . CAD (coronary artery disease)    a. 08/2014: inf STEMI s/p DES to RCA.   . Crohn's disease ([place])   . GERD (gastroesophageal reflux disease)   . Glaucoma   . Glucose intolerance (impaired glucose tolerance)    a. HgA1c 6.2 during 08/2014 admission   . Hypercholesterolemia   . Hyperthyroidism    a. s/p ablation maintained on synthroid  . Multiple sclerosis ([place])   . Tobacco abuse    Past Surgical History:  Procedure Laterality Date  . AUGMENTATION MAMMAPLASTY Left   . [place]  . COLONOSCOPY  2010   Dr Vira Agar  . LEFT HEART CATHETERIZATION WITH CORONARY ANGIOGRAM N/A 09/01/2014   Procedure: LEFT HEART CATHETERIZATION WITH CORONARY ANGIOGRAM;  Surgeon: Peter M Martinique, MD;  Location: [facility] CATH LAB;  Service: Cardiovascular;  Laterality: N/A;  . PORTACATH PLACEMENT  11/07/2012  . RIGHT OOPHORECTOMY  1979  . TONSILLECTOMY AND ADENOIDECTOMY  1957   Family History  Problem Relation Age of Onset  . Diabetes Mother   . Heart disease Mother     myocardial infarction  . Glaucoma Mother   . Graves' disease Mother   . Heart attack Mother 73  . Lung disease Father   . Heart disease      half sister  . Peripheral vascular disease      half sister  . Breast cancer Neg Hx    Social History   Social History  . Marital status: Divorced    Spouse name: N/A  .  Number of children: 1  . Years of education: N/A   Social History Main Topics  . Smoking status: Former Smoker    Packs/day: 0.00    Years: 40.00    Quit date: 07/01/2015  . Smokeless tobacco: Never Used  . Alcohol use 0.0 oz/week  . Drug use: No  . Sexual activity: Not Asked   Other Topics Concern  . None   Social History Narrative  . None    Outpatient Encounter Prescriptions as of 02/25/2016  Medication Sig  . albuterol (VENTOLIN HFA) 108 (90 Base) MCG/ACT inhaler INHALE TWO PUFFS BY MOUTH EVERY 6 HOURS AS NEEDED FOR WHEEZING FOR SHORTNESS OF BREATH  . aspirin 81 MG chewable tablet Chew 1 tablet (81 mg total) by mouth daily.  . CRESTOR 5 MG tablet Take 5 mg by mouth daily.  . Fluticasone-Salmeterol (ADVAIR DISKUS) 250-50 MCG/DOSE AEPB INHALE ONE DOSE BY MOUTH TWICE DAILY* RINSE MOUTH AFTER EACH USE*  . latanoprost (XALATAN) 0.005 % ophthalmic solution Place 1-2 drops into both eyes daily.  Marland Kitchen levothyroxine (SYNTHROID, LEVOTHROID) 112 MCG tablet Take 1 tablet (112 mcg total) by mouth daily.  . metoprolol tartrate (LOPRESSOR) 25 MG tablet TAKE ONE-HALF TABLET  BY MOUTH TWICE DAILY  . omeprazole (PRILOSEC OTC) 20 MG tablet Take 20 mg by mouth daily.  . ticagrelor (BRILINTA) 60 MG TABS tablet Take 1 tablet (60 mg total) by mouth 2 (two) times daily.   No facility-administered encounter medications on file as of 02/25/2016.     Review of Systems  Constitutional: Negative for appetite change and unexpected weight change.  HENT: Negative for congestion and sinus pressure.   Respiratory: Negative for cough, chest tightness and shortness of breath.   Cardiovascular: Negative for chest pain, palpitations and leg swelling.  Gastrointestinal: Negative for abdominal pain, diarrhea, nausea and vomiting.  Genitourinary: Negative for difficulty urinating and dysuria.  Musculoskeletal: Negative for back pain and joint swelling.  Skin: Negative for color change and rash.  Neurological:  Negative for dizziness, light-headedness and headaches.  Psychiatric/Behavioral: Negative for agitation and dysphoric mood.       Increased stress as outlined.         Objective:    Physical Exam  Constitutional: She appears well-developed and well-nourished. No distress.  HENT:  Nose: Nose normal.  Mouth/Throat: Oropharynx is clear and moist.  Neck: Neck supple. No thyromegaly present.  Cardiovascular: Normal rate and regular rhythm.   Pulmonary/Chest: Breath sounds normal. No respiratory distress. She has no wheezes.  Abdominal: Soft. Bowel sounds are normal. There is no tenderness.  Musculoskeletal: She exhibits no edema or tenderness.  Lymphadenopathy:    She has no cervical adenopathy.  Skin: No rash noted. No erythema.  Psychiatric: She has a normal mood and affect. Her behavior is normal.    BP 136/76   Pulse 83   Temp 97.9 F (36.6 C) (Oral)   Ht _0  (1.676 m)   Wt 206 lb 6.4 oz (93.6 kg)   LMP 06/15/1977   SpO2 98%   BMI 33.31 kg/m  Wt Readings from Last 3 Encounters:  02/25/16 206 lb 6.4 oz (93.6 kg)  11/12/15 205 lb (93 kg)  09/15/15 204 lb 4 oz (92.6 kg)     Lab Results  Component Value Date   WBC 6.7 08/04/2015   HGB 12.5 08/04/2015   HCT 37.0 08/04/2015   PLT 232.0 08/04/2015   GLUCOSE 125 (H) 11/09/2015   CHOL 171 11/09/2015   TRIG 181.0 (H) 11/09/2015   HDL 43.70 11/09/2015   LDLDIRECT 206.0 08/04/2015   LDLCALC 91 11/09/2015   ALT 18 11/09/2015   AST 18 11/09/2015   NA 140 11/09/2015   K 4.4 11/09/2015   CL 105 11/09/2015   CREATININE 0.84 11/09/2015   BUN 12 11/09/2015   CO2 29 11/09/2015   TSH 3.22 08/04/2015   HGBA1C 6.5 11/09/2015   MICROALBUR 0.8 11/09/2015    US Breast Ltd Uni Right Inc Axilla  Result Date: 01/13/2016 CLINICAL DATA:  70 year old female with history of right lumpectomy in 2014. Routine evaluation. EXAM: 2D DIGITAL DIAGNOSTIC BILATERAL MAMMOGRAM WITH IMPLANTS, CAD AND ADJUNCT TOMO ULTRASOUND RIGHT BREAST The  patient has a left subglandular implant. Standard and implant displaced views were performed of the left breast. COMPARISON:  Previous exam(s). ACR Breast Density Category c: The breast tissue is heterogeneously dense, which may obscure small masses. FINDINGS: No suspicious mass, calcifications, or other abnormality is identified within the left breast. A possible asymmetry was identified within the posterior, superior right breast in the region of the lumpectomy bed. Additional spot compression imaging of this region with tomosynthesis demonstrates no suspicious mass in the area of concern. On physical exam, there is  scarring related to prior right lumpectomy of the superior right breast. No discrete mass is felt in the area of concern. Targeted ultrasound is performed, showing no suspicious cystic or solid sonographic finding in the area of concern within the superior right breast. Expected postsurgical changes of prior lumpectomy were seen. Mammographic images were processed with CAD. IMPRESSION: No mammographic or sonographic evidence of malignancy. RECOMMENDATION: Bilateral diagnostic mammogram in 1 year. I have discussed the findings and recommendations with the patient. Results were also provided in writing at the conclusion of the visit. If applicable, a reminder letter will be sent to the patient regarding the next appointment. BI-RADS CATEGORY  2: Benign. Electronically Signed   By: Pamelia Hoit M.D.   On: 01/13/2016 13:09   Mm Diag Breast W/implant Tomo Bilateral  Result Date: 01/13/2016 CLINICAL DATA:  70 year old female with history of right lumpectomy in 2014. Routine evaluation. EXAM: 2D DIGITAL DIAGNOSTIC BILATERAL MAMMOGRAM WITH IMPLANTS, CAD AND ADJUNCT TOMO ULTRASOUND RIGHT BREAST The patient has a left subglandular implant. Standard and implant displaced views were performed of the left breast. COMPARISON:  Previous exam(s). ACR Breast Density Category c: The breast tissue is heterogeneously  dense, which may obscure small masses. FINDINGS: No suspicious mass, calcifications, or other abnormality is identified within the left breast. A possible asymmetry was identified within the posterior, superior right breast in the region of the lumpectomy bed. Additional spot compression imaging of this region with tomosynthesis demonstrates no suspicious mass in the area of concern. On physical exam, there is scarring related to prior right lumpectomy of the superior right breast. No discrete mass is felt in the area of concern. Targeted ultrasound is performed, showing no suspicious cystic or solid sonographic finding in the area of concern within the superior right breast. Expected postsurgical changes of prior lumpectomy were seen. Mammographic images were processed with CAD. IMPRESSION: No mammographic or sonographic evidence of malignancy. RECOMMENDATION: Bilateral diagnostic mammogram in 1 year. I have discussed the findings and recommendations with the patient. Results were also provided in writing at the conclusion of the visit. If applicable, a reminder letter will be sent to the patient regarding the next appointment. BI-RADS CATEGORY  2: Benign. Electronically Signed   By: Pamelia Hoit M.D.   On: 01/13/2016 13:09       Assessment & Plan:   Problem List Items Addressed This Visit    Asthma    Breathing stable.  Continue inhalers.        Breast cancer of upper-outer quadrant of right female breast [facility])    Mammogram 01/13/16 - Birads II.       CAD (coronary artery disease)    Followed by cardiology.  Continue risk factor modification.  Currently stable.       Carotid artery stenosis    Followed by cardiology.        Diabetes mellitus ([place])    Low carb diet and exercise.  Follow met b and a1c.  Recent a1c improved.        GERD (gastroesophageal reflux disease)    Controlled on prilosec.        Hypercholesterolemia    Low cholesterol diet and exercise.  On crestor.  Tolerating.   Cholesterol improved.  Follow lipid panel and liver function tests.        Hypertension    Blood pressure under good control.  Continue same medication regimen.  Follow pressures.  Follow metabolic panel.        Hypothyroidism  On thyroid replacement.  Follow tsh.        Multiple sclerosis ([place])    Desires no further w/up.  Stable.        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HCC)    a1c recently checked improved.  Low carb diet and exercise.  Follow met b and a1c.        ST elevation myocardial infarction (STEMI) of inferior wall ([place])    Followed by cardiology.  Currently doing well.  Follow.         Other Visit Diagnoses   None.      Einar Pheasant, MD

## 2016-02-25 NOTE — Progress Notes (Signed)
Pre visit review using our clinic review tool, if applicable. No additional management support is needed unless otherwise documented below in the visit note. 

## 2016-02-28 ENCOUNTER — Encounter: Payer: Self-pay | Admitting: Internal Medicine

## 2016-02-28 NOTE — Assessment & Plan Note (Signed)
Followed by cardiology.  Continue risk factor modification.  Currently stable.

## 2016-02-28 NOTE — Assessment & Plan Note (Signed)
Mammogram 01/13/16 - Birads II.

## 2016-02-28 NOTE — Assessment & Plan Note (Signed)
Controlled on prilosec.

## 2016-02-28 NOTE — Assessment & Plan Note (Signed)
a1c recently checked improved.  Low carb diet and exercise.  Follow met b and a1c.

## 2016-02-28 NOTE — Assessment & Plan Note (Signed)
Blood pressure under good control.  Continue same medication regimen.  Follow pressures.  Follow metabolic panel.   

## 2016-02-28 NOTE — Assessment & Plan Note (Signed)
Followed by cardiology.  Currently doing well.  Follow.

## 2016-02-28 NOTE — Assessment & Plan Note (Signed)
Low cholesterol diet and exercise.  On crestor.  Tolerating.  Cholesterol improved.  Follow lipid panel and liver function tests.

## 2016-02-28 NOTE — Assessment & Plan Note (Signed)
On thyroid replacement.  Follow tsh.  

## 2016-02-28 NOTE — Assessment & Plan Note (Signed)
Desires no further w/up.  Stable.

## 2016-02-28 NOTE — Assessment & Plan Note (Signed)
Low carb diet and exercise.  Follow met b and a1c. Recent a1c improved.   

## 2016-02-28 NOTE — Assessment & Plan Note (Signed)
Followed by cardiology 

## 2016-02-28 NOTE — Assessment & Plan Note (Signed)
Breathing stable.  Continue inhalers.

## 2016-03-08 NOTE — Telephone Encounter (Signed)
error 

## 2016-04-18 ENCOUNTER — Telehealth: Payer: Self-pay | Admitting: Internal Medicine

## 2016-04-18 NOTE — Telephone Encounter (Signed)
Pt declined to get the AWV. Thank you!

## 2016-05-04 ENCOUNTER — Other Ambulatory Visit: Payer: Self-pay | Admitting: Internal Medicine

## 2016-05-31 DIAGNOSIS — H401131 Primary open-angle glaucoma, bilateral, mild stage: Secondary | ICD-10-CM | POA: Diagnosis not present

## 2016-06-13 ENCOUNTER — Encounter: Payer: Self-pay | Admitting: Cardiovascular Disease

## 2016-06-13 ENCOUNTER — Ambulatory Visit (INDEPENDENT_AMBULATORY_CARE_PROVIDER_SITE_OTHER): Payer: Medicare Other | Admitting: Cardiovascular Disease

## 2016-06-13 VITALS — BP 126/70 | HR 84 | Ht 66.0 in | Wt 200.5 lb

## 2016-06-13 DIAGNOSIS — E7800 Pure hypercholesterolemia, unspecified: Secondary | ICD-10-CM

## 2016-06-13 DIAGNOSIS — I2510 Atherosclerotic heart disease of native coronary artery without angina pectoris: Secondary | ICD-10-CM

## 2016-06-13 DIAGNOSIS — I2119 ST elevation (STEMI) myocardial infarction involving other coronary artery of inferior wall: Secondary | ICD-10-CM

## 2016-06-13 DIAGNOSIS — E1165 Type 2 diabetes mellitus with hyperglycemia: Secondary | ICD-10-CM

## 2016-06-13 DIAGNOSIS — I10 Essential (primary) hypertension: Secondary | ICD-10-CM

## 2016-06-13 DIAGNOSIS — E1159 Type 2 diabetes mellitus with other circulatory complications: Secondary | ICD-10-CM

## 2016-06-13 DIAGNOSIS — Z720 Tobacco use: Secondary | ICD-10-CM

## 2016-06-13 DIAGNOSIS — I6522 Occlusion and stenosis of left carotid artery: Secondary | ICD-10-CM

## 2016-06-13 NOTE — Progress Notes (Signed)
Cardiology Office Note  Date:  06/13/2016   ID:  [name], DOB [birth year], MRN [id number]  PCP:  Einar Pheasant, MD   Chief Complaint  Patient presents with  . other    6 month follow up patient complains of rapid heart beat when laying down.Meds reviewed verbally with patient.     HPI:  [name] is a 71 yo woman with obesity, long history of smoking who continues to smoke, hyperlipidemia, intolerant of statins, GERD, hypertension with moderate carotid arterial disease on the left, history of STEMI in 2016 with stent placed to her mid RCA, who presents for routine follow-up of her coronary artery disease, hyperlipidemia Patient reports history of Crohn's  In follow-up she reports that she is having Tachycardia at night, around 8 to 10 PM she develops 30 seconds of profound tachycardia. She does not notice a change in her radial pulse but does appreciate fluttering in her chest at his profound.  She has symptoms approximately once per week, always in the evening  She has been working on her diet Weight is down 6 pounds from her prior clinic visits   Past medical history reviewed April 2016 she developed acute chest pain, bilateral arm pain, neck pain/jaw pain bilaterally.  She had cardiac catheterization, essentially occluded mid RCA with 50-70% mid LAD disease, DES placed the RCA. History of breast cancer, Adriamycin through port in the left chest Carotid ultrasound with 50-69% carotid stenosis  In follow-up today, she reports that she is working on her diet, has had difficulty with weight loss, trying to eat less sugar Overall feels well with no complaints, no chest pain concerning for angina She continues to smoke, Having difficulty controlling her diabetes Was off her cholesterol medication, now takes Crestor days per week  EKG on today's visit shows normal sinus rhythm with rate 79 bpm, no significant ST or T-wave changes  Other past medical history She has  seen [facility] in the past and unable to tolerate statin secondary to myalgias. She reports a history of MS  Review of her carotid ultrasound with her shows 50-69% disease on the left Review of her lab work with her shows total cholesterol 260. Elevated hemoglobin A1c greater than 6   PMH:   has a past medical history of Asthma with bronchitis; Breast cancer ([place]) (2014); CAD (coronary artery disease); Crohn's disease ([place]); GERD (gastroesophageal reflux disease); Glaucoma; Glucose intolerance (impaired glucose tolerance); Hypercholesterolemia; Hyperthyroidism; Multiple sclerosis ([place]); and Tobacco abuse.  PSH:    Past Surgical History:  Procedure Laterality Date  . AUGMENTATION MAMMAPLASTY Left   . [place]  . COLONOSCOPY  2010   Dr Vira Agar  . LEFT HEART CATHETERIZATION WITH CORONARY ANGIOGRAM N/A 09/01/2014   Procedure: LEFT HEART CATHETERIZATION WITH CORONARY ANGIOGRAM;  Surgeon: Peter M Martinique, MD;  Location: [facility] CATH LAB;  Service: Cardiovascular;  Laterality: N/A;  . PORTACATH PLACEMENT  11/07/2012  . RIGHT OOPHORECTOMY  1979  . TONSILLECTOMY AND ADENOIDECTOMY  1957    Current Outpatient Prescriptions  Medication Sig Dispense Refill  . albuterol (VENTOLIN HFA) 108 (90 Base) MCG/ACT inhaler INHALE TWO PUFFS BY MOUTH EVERY 6 HOURS AS NEEDED FOR WHEEZING FOR SHORTNESS OF BREATH 162 each 0  . aspirin 81 MG chewable tablet Chew 1 tablet (81 mg total) by mouth daily.    . CRESTOR 5 MG tablet Take 5 mg by mouth daily.    . Fluticasone-Salmeterol (ADVAIR DISKUS) 250-50 MCG/DOSE AEPB  INHALE ONE DOSE BY MOUTH TWICE DAILY* RINSE MOUTH AFTER EACH USE* 180 each 3  . latanoprost (XALATAN) 0.005 % ophthalmic solution Place 1-2 drops into both eyes daily.    Marland Kitchen levothyroxine (SYNTHROID, LEVOTHROID) 112 MCG tablet TAKE ONE TABLET BY MOUTH ONCE DAILY 90 tablet 3  . metoprolol tartrate (LOPRESSOR) 25 MG tablet TAKE ONE-HALF TABLET BY MOUTH TWICE  DAILY 60 tablet 6  . omeprazole (PRILOSEC OTC) 20 MG tablet Take 20 mg by mouth daily.    . ticagrelor (BRILINTA) 60 MG TABS tablet Take 1 tablet (60 mg total) by mouth 2 (two) times daily. 180 tablet 3   No current facility-administered medications for this visit.      Allergies:   Statins; Clindamycin/lincomycin; Codeine; Shellfish allergy; and Valium [diazepam]   Social History:  The patient  reports that she quit smoking about a year ago. She smoked 0.00 packs per day for 40.00 years. She has never used smokeless tobacco. She reports that she drinks alcohol. She reports that she does not use drugs.   Family History:   family history includes Diabetes in her mother; Glaucoma in her mother; Berenice Primas' disease in her mother; Heart attack (age of onset: 50) in her mother; Heart disease in her mother; Lung disease in her father.    Review of Systems: Review of Systems  Constitutional: Negative.   Respiratory: Negative.   Cardiovascular: Positive for palpitations.       Tachycardia  Gastrointestinal: Negative.   Musculoskeletal: Negative.   Neurological: Negative.   Psychiatric/Behavioral: Negative.   All other systems reviewed and are negative.    PHYSICAL EXAM: VS:  BP 126/70 (BP Location: Left Arm, Patient Position: Sitting, Cuff Size: Normal)   Pulse 84   Ht 5' 6"  (1.676 m)   Wt 200 lb 8 oz (90.9 kg)   LMP 06/15/1977   BMI 32.36 kg/m  , BMI Body mass index is 32.36 kg/m. GEN: Well nourished, well developed, in no acute distress  HEENT: normal  Neck: no JVD, carotid bruits, or masses Cardiac: RRR; no murmurs, rubs, or gallops,no edema  Respiratory:  clear to auscultation bilaterally, normal work of breathing GI: soft, nontender, nondistended, + BS MS: no deformity or atrophy  Skin: warm and dry, no rash Neuro:  Strength and sensation are intact Psych: euthymic mood, full affect    Recent Labs: 08/04/2015: Hemoglobin 12.5; Platelets 232.0; TSH 3.22 11/09/2015: ALT 18;  BUN 12; Creatinine, Ser 0.84; Potassium 4.4; Sodium 140    Lipid Panel Lab Results  Component Value Date   CHOL 171 11/09/2015   HDL 43.70 11/09/2015   LDLCALC 91 11/09/2015   TRIG 181.0 (H) 11/09/2015      Wt Readings from Last 3 Encounters:  06/13/16 200 lb 8 oz (90.9 kg)  02/25/16 206 lb 6.4 oz (93.6 kg)  11/12/15 205 lb (93 kg)       ASSESSMENT AND PLAN:  Tachycardia Etiology unclear, unable to exclude atrial fibrillation or atrial flutter Presenting once per week 30 seconds She does not want workup at this time including event monitor She prefers to try extra metoprolol in the evening for rhythm suppression  If symptoms get worse recommended she call our office   Essential hypertension - Plan: EKG 12-Lead Blood pressure is well controlled on today's visit. No changes made to the medications.  Hypercholesterolemia - Plan: EKG 12-Lead Encouraged her to stay on her Crestor daily Goal LDL less than 70 If needed could add Zetia  ST elevation myocardial infarction (  STEMI) of inferior wall ([place]) - Plan: EKG 12-Lead Reports that she is doing well, no recurrent chest pain, arm pain, jaw pain concerning for  Angina  Coronary artery disease involving native coronary artery of native heart without angina pectoris -  Currently with no symptoms of angina. No further workup at this time. Continue current medication regimen.  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[place]) - Plan: EKG 12-Lead We have encouraged continued exercise, careful diet management in an effort to lose weight.  Tobacco abuse  she continues to smoke daily We have encouraged her to continue to work on weaning her cigarettes and smoking cessation. She will continue to work on this and does not want any assistance with chantix.   Stenosis of left carotid artery Significant stenosis, recommended smoking cessation, aggressive lipid management   Total encounter time more than 25  minutes  Greater than 50% was spent in counseling and coordination of care with the patient   Disposition:   F/U  6 months   Orders Placed This Encounter  Procedures  . EKG 12-Lead     Signed, Esmond Plants, M.D., Ph.D. 06/13/2016  [place], [place]

## 2016-06-13 NOTE — Patient Instructions (Addendum)
Medication Instructions:   Please increase the metoprolol in the evening to suppress the tachycardia. You could increase up to a full pill at dinner Even up to 1 1/2 pills if needed   Labwork:  No new labs needed  Testing/Procedures:  No further testing at this time   I recommend watching educational videos on topics of interest to you at:       [email or web address].goemmi.com  Enter code: [email or web address]    Follow-Up: It was a pleasure seeing you in the office today. Please call us if you have new issues that need to be addressed before your next appt.  [phone]  Your physician wants you to follow-up in: 6 months.  You will receive a reminder letter in the mail two months in advance. If you don't receive a letter, please call our office to schedule the follow-up appointment.  If you need a refill on your cardiac medications before your next appointment, please call your pharmacy.

## 2016-06-15 ENCOUNTER — Telehealth: Payer: Self-pay | Admitting: Cardiovascular Disease

## 2016-06-15 NOTE — Telephone Encounter (Signed)
Left voicemail message to call back  

## 2016-06-15 NOTE — Telephone Encounter (Signed)
Pt c/o medication issue:  1. Name of Medication: crestor  2. How are you currently taking this medication (dosage and times per day)? 5 mg po daily x 5 days week wants to start taking 10 mg if tolerated   3. Are you having a reaction (difficulty breathing--STAT)? No she is trying to work herself up to 10 mg   4. What is your medication issue? See above

## 2016-06-16 NOTE — Telephone Encounter (Signed)
Spoke w/ pt.  She reports that she was given an rx for Lipitor 10 mg at her last ov, she would like to try to gradually increase her dose to reach this, as her TC was 171 at last check. Advised her that her goal is < 150, to proceed w/ titration upwards. Asked her to call back if she is unable to tolerate increased dose.

## 2016-06-28 ENCOUNTER — Other Ambulatory Visit: Payer: Medicare Other

## 2016-07-01 ENCOUNTER — Ambulatory Visit (INDEPENDENT_AMBULATORY_CARE_PROVIDER_SITE_OTHER): Payer: Medicare Other | Admitting: Internal Medicine

## 2016-07-01 ENCOUNTER — Ambulatory Visit (INDEPENDENT_AMBULATORY_CARE_PROVIDER_SITE_OTHER): Payer: Medicare Other

## 2016-07-01 ENCOUNTER — Encounter: Payer: Self-pay | Admitting: Internal Medicine

## 2016-07-01 VITALS — BP 126/68 | HR 78 | Temp 98.3°F | Resp 20 | Ht 66.0 in | Wt 207.0 lb

## 2016-07-01 DIAGNOSIS — R05 Cough: Secondary | ICD-10-CM | POA: Diagnosis not present

## 2016-07-01 DIAGNOSIS — I6522 Occlusion and stenosis of left carotid artery: Secondary | ICD-10-CM

## 2016-07-01 DIAGNOSIS — I10 Essential (primary) hypertension: Secondary | ICD-10-CM

## 2016-07-01 DIAGNOSIS — K219 Gastro-esophageal reflux disease without esophagitis: Secondary | ICD-10-CM

## 2016-07-01 DIAGNOSIS — G35 Multiple sclerosis: Secondary | ICD-10-CM

## 2016-07-01 DIAGNOSIS — E1159 Type 2 diabetes mellitus with other circulatory complications: Secondary | ICD-10-CM | POA: Diagnosis not present

## 2016-07-01 DIAGNOSIS — E038 Other specified hypothyroidism: Secondary | ICD-10-CM

## 2016-07-01 DIAGNOSIS — R059 Cough, unspecified: Secondary | ICD-10-CM

## 2016-07-01 DIAGNOSIS — C50411 Malignant neoplasm of upper-outer quadrant of right female breast: Secondary | ICD-10-CM

## 2016-07-01 DIAGNOSIS — K50919 Crohn's disease, unspecified, with unspecified complications: Secondary | ICD-10-CM | POA: Diagnosis not present

## 2016-07-01 DIAGNOSIS — Z720 Tobacco use: Secondary | ICD-10-CM

## 2016-07-01 DIAGNOSIS — R062 Wheezing: Secondary | ICD-10-CM

## 2016-07-01 DIAGNOSIS — I2510 Atherosclerotic heart disease of native coronary artery without angina pectoris: Secondary | ICD-10-CM | POA: Diagnosis not present

## 2016-07-01 DIAGNOSIS — E7800 Pure hypercholesterolemia, unspecified: Secondary | ICD-10-CM | POA: Diagnosis not present

## 2016-07-01 DIAGNOSIS — E1165 Type 2 diabetes mellitus with hyperglycemia: Secondary | ICD-10-CM

## 2016-07-01 MED ORDER — AMOXICILLIN-POT CLAVULANATE 875-125 MG PO TABS: 1.0000 | ORAL_TABLET | Freq: Two times a day (BID) | ORAL | 0 refills | Status: DC

## 2016-07-01 MED ORDER — PREDNISONE 10 MG PO TABS: ORAL_TABLET | ORAL | 0 refills | Status: DC

## 2016-07-01 NOTE — Patient Instructions (Signed)
Take a probiotic while you are on the antibiotics and for two weeks after completing the antibiotic.    Examples of probiotics:  Culturelle, align or florastor.

## 2016-07-01 NOTE — Progress Notes (Signed)
Patient ID: [name], female   DOB: 1946/02/20, 71 y.o.   MRN: [id number]   Subjective:    Patient ID: [name], female    DOB: [birth year], 71 y.o.   MRN: [id number]  HPI  Patient here for a scheduled follow up.  States she has had increased cough and chest congestion over the past 8 - 10 days.  Some chills.  Subjective fever.  Joint aches initially.  Increased sinus pressure and nasal congestion.  No nausea.  Some vomiting after increased cough.  No abdominal pain.  Saw Dr Rockey Situ.  He increased metoprolol.  She did not tolerate.  Back down on previous dose.  She is now on 56m daily of her statin.  Tolerating.  She quit smoking two weeks ago.     Past Medical History:  Diagnosis Date  . Asthma with bronchitis   . [place]  . CAD (coronary artery disease)    a. 08/2014: inf STEMI s/p DES to RCA.   . Crohn's disease ([place]   . GERD (gastroesophageal reflux disease)   . Glaucoma   . Glucose intolerance (impaired glucose tolerance)    a. HgA1c 6.2 during 08/2014 admission   . Hypercholesterolemia   . Hyperthyroidism    a. s/p ablation maintained on synthroid  . [place] ([place]   . Tobacco abuse    Past Surgical History:  Procedure Laterality Date  . [place]ATION M[place]LASTY Left   [place] COL[place]  2010   Dr EVira Agar . LEFT HEART CATHETERIZATION WITH CORONARY ANGIOGRAM N/A 09/01/2014   Procedure: LEFT HEART CATHETERIZATION WITH CORONARY ANGIOGRAM;  Surgeon: Peter M JMartinique MD;  [facility];  Service: Cardiovascular;  Laterality: N/A;  . PORTACATH PLACEMENT  11/07/2012  . RIGHT OOPHORECTOMY  1979  . TONSILLECTOMY AND ADENOIDECTOMY  1957   Family History  Problem Relation Age of Onset  . Diabetes Mother   . Heart disease Mother     myocardial infarction  . Glaucoma Mother   . Graves' disease Mother   . Heart attack Mother 664 . Lung disease Father   . Heart disease      half  sister  . Peripheral vascular disease      half sister  . Breast cancer Neg Hx    Social History   Social History  . Marital status: Divorced    Spouse name: N/A  . Number of children: 1  . Years of education: N/A   Social History Main Topics  . Smoking status: Former Smoker    Packs/day: 0.00    Years: 40.00    Quit date: 07/01/2015  . Smokeless tobacco: Never Used  . Alcohol use 0.0 oz/week  . Drug use: No  . Sexual activity: Not Asked   Other Topics Concern  . None   Social History Narrative  . None    Outpatient Encounter Prescriptions as of 07/01/2016  Medication Sig  . albuterol (VENTOLIN HFA) 108 (90 Base) MCG/ACT inhaler INHALE TWO PUFFS BY MOUTH EVERY 6 HOURS AS NEEDED FOR WHEEZING FOR SHORTNESS OF BREATH  . aspirin 81 MG chewable tablet Chew 1 tablet (81 mg total) by mouth daily.  . CRESTOR 5 MG tablet Take 5 mg by mouth daily.   . Fluticasone-Salmeterol (ADVAIR DISKUS) 250-50 MCG/DOSE AEPB INHALE ONE DOSE BY MOUTH TWICE DAILY* RINSE MOUTH AFTER EACH USE*  . latanoprost (XALATAN) 0.005 % ophthalmic solution Place 1-2  drops into both eyes daily.  Marland Kitchen levothyroxine (SYNTHROID, LEVOTHROID) 112 MCG tablet TAKE ONE TABLET BY MOUTH ONCE DAILY  . metoprolol tartrate (LOPRESSOR) 25 MG tablet TAKE ONE-HALF TABLET BY MOUTH TWICE DAILY  . omeprazole (PRILOSEC OTC) 20 MG tablet Take 20 mg by mouth daily.  . ticagrelor (BRILINTA) 60 MG TABS tablet Take 1 tablet (60 mg total) by mouth 2 (two) times daily.  Marland Kitchen amoxicillin-clavulanate (AUGMENTIN) 875-125 MG tablet Take 1 tablet by mouth 2 (two) times daily.  . predniSONE (DELTASONE) 10 MG tablet Take 4 tablets x 1 day and then decrease by 1/2 tablet per day until down to zero mg.   No facility-administered encounter medications on file as of 07/01/2016.     Review of Systems  Constitutional: Negative for appetite change and unexpected weight change.  HENT: Positive for congestion and sinus pressure.   Respiratory: Positive for  cough and wheezing. Negative for chest tightness and shortness of breath.   Cardiovascular: Negative for chest pain, palpitations and leg swelling.  Gastrointestinal: Negative for abdominal pain, diarrhea and nausea.  Genitourinary: Negative for difficulty urinating and dysuria.  Musculoskeletal: Negative for back pain and joint swelling.  Skin: Negative for color change and rash.  Neurological: Negative for dizziness, light-headedness and headaches.  Psychiatric/Behavioral: Negative for agitation and dysphoric mood.       Objective:    Physical Exam  Constitutional: She appears well-developed and well-nourished. No distress.  HENT:  Nose: Nose normal.  Mouth/Throat: Oropharynx is clear and moist.  Neck: Neck supple. No thyromegaly present.  Cardiovascular: Normal rate and regular rhythm.   Pulmonary/Chest: Breath sounds normal. No respiratory distress. She has no wheezes.  Abdominal: Soft. Bowel sounds are normal. There is no tenderness.  Musculoskeletal: She exhibits no edema or tenderness.  Lymphadenopathy:    She has no cervical adenopathy.  Skin: No rash noted. No erythema.  Psychiatric: She has a normal mood and affect. Her behavior is normal.    BP 126/68 (BP Location: Left Arm, Patient Position: Sitting, Cuff Size: Large)   Pulse 78   Temp 98.3 F (36.8 C) (Oral)   Resp 20   Ht _0  (1.676 m)   Wt 207 lb (93.9 kg)   LMP 06/15/1977   SpO2 95%   BMI 33.41 kg/m  Wt Readings from Last 3 Encounters:  07/01/16 207 lb (93.9 kg)  06/13/16 200 lb 8 oz (90.9 kg)  02/25/16 206 lb 6.4 oz (93.6 kg)     Lab Results  Component Value Date   WBC 6.7 08/04/2015   HGB 12.5 08/04/2015   HCT 37.0 08/04/2015   PLT 232.0 08/04/2015   GLUCOSE 125 (H) 11/09/2015   CHOL 171 11/09/2015   TRIG 181.0 (H) 11/09/2015   HDL 43.70 11/09/2015   LDLDIRECT 206.0 08/04/2015   LDLCALC 91 11/09/2015   ALT 18 11/09/2015   AST 18 11/09/2015   NA 140 11/09/2015   K 4.4 11/09/2015   CL  105 11/09/2015   CREATININE 0.84 11/09/2015   BUN 12 11/09/2015   CO2 29 11/09/2015   TSH 3.22 08/04/2015   HGBA1C 6.5 11/09/2015   MICROALBUR 0.8 11/09/2015    US Breast Ltd Uni Right Inc Axilla  Result Date: 01/13/2016 CLINICAL DATA:  71 year old female with history of right lumpectomy in 2014. Routine evaluation. EXAM: 2D DIGITAL DIAGNOSTIC BILATERAL MAMMOGRAM WITH IMPLANTS, CAD AND ADJUNCT TOMO ULTRASOUND RIGHT BREAST The patient has a left subglandular implant. Standard and implant displaced views were performed of the left  breast. COMPARISON:  Previous exam(s). ACR Breast Density Category c: The breast tissue is heterogeneously dense, which may obscure small masses. FINDINGS: No suspicious mass, calcifications, or other abnormality is identified within the left breast. A possible asymmetry was identified within the posterior, superior right breast in the region of the lumpectomy bed. Additional spot compression imaging of this region with tomosynthesis demonstrates no suspicious mass in the area of concern. On physical exam, there is scarring related to prior right lumpectomy of the superior right breast. No discrete mass is felt in the area of concern. Targeted ultrasound is performed, showing no suspicious cystic or solid sonographic finding in the area of concern within the superior right breast. Expected postsurgical changes of prior lumpectomy were seen. Mammographic images were processed with CAD. IMPRESSION: No mammographic or sonographic evidence of malignancy. RECOMMENDATION: Bilateral diagnostic mammogram in 1 year. I have discussed the findings and recommendations with the patient. Results were also provided in writing at the conclusion of the visit. If applicable, a reminder letter will be sent to the patient regarding the next appointment. BI-RADS CATEGORY  2: Benign. Electronically Signed   By: Pamelia Hoit M.D.   On: 01/13/2016 13:09   Mm Diag Breast W/implant Tomo Bilateral  Result  Date: 01/13/2016 CLINICAL DATA:  71 year old female with history of right lumpectomy in 2014. Routine evaluation. EXAM: 2D DIGITAL DIAGNOSTIC BILATERAL MAMMOGRAM WITH IMPLANTS, CAD AND ADJUNCT TOMO ULTRASOUND RIGHT BREAST The patient has a left subglandular implant. Standard and implant displaced views were performed of the left breast. COMPARISON:  Previous exam(s). ACR Breast Density Category c: The breast tissue is heterogeneously dense, which may obscure small masses. FINDINGS: No suspicious mass, calcifications, or other abnormality is identified within the left breast. A possible asymmetry was identified within the posterior, superior right breast in the region of the lumpectomy bed. Additional spot compression imaging of this region with tomosynthesis demonstrates no suspicious mass in the area of concern. On physical exam, there is scarring related to prior right lumpectomy of the superior right breast. No discrete mass is felt in the area of concern. Targeted ultrasound is performed, showing no suspicious cystic or solid sonographic finding in the area of concern within the superior right breast. Expected postsurgical changes of prior lumpectomy were seen. Mammographic images were processed with CAD. IMPRESSION: No mammographic or sonographic evidence of malignancy. RECOMMENDATION: Bilateral diagnostic mammogram in 1 year. I have discussed the findings and recommendations with the patient. Results were also provided in writing at the conclusion of the visit. If applicable, a reminder letter will be sent to the patient regarding the next appointment. BI-RADS CATEGORY  2: Benign. Electronically Signed   By: Pamelia Hoit M.D.   On: 01/13/2016 13:09       Assessment & Plan:   Problem List Items Addressed This Visit    Breast cancer of upper-outer quadrant of right female breast ([place])    Mammogram 01/13/16 - [facility].       Relevant Medications   amoxicillin-clavulanate (AUGMENTIN) 875-125 MG tablet    predniSONE (DELTASONE) 10 MG tablet   CAD (coronary artery disease)    Followed by cardiology.  Continue risk factor modification.        Carotid artery stenosis    Followed by cardiology.        Cough    Increased cough and congestion and sinus pressure.  Persistent.  Treat with prednisone taper and augmentin as directed.  Nasal sprays as directed.  advair twice a day.  Rescue inhaler as needed.  Follow.  Check cxr.       Relevant Orders   DG Chest 2 View (Completed)   Crohn's disease ([place])    Stable.        GERD (gastroesophageal reflux disease)    Controlled on current regimen.  Follow.       Hypercholesterolemia    On 25m crestor now.  Tolerating.  Follow lipid panel and liver function tests.        Hypertension    Blood pressure under good control.  Continue same medication regimen.  Follow pressures.  Follow metabolic panel.        Hypothyroidism    On thyroid replacement.  Follow tsh.        [place]    Has desired no further w/up.  Stable.        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[facility])    Low carb diet and exercise.  Follow met b and a1c.        Tobacco abuse    Quit smoking two weeks ago.         Other Visit Diagnoses    Wheezing    -  Primary   Relevant Orders   DG Chest 2 View (Completed)       SEinar Pheasant MD

## 2016-07-01 NOTE — Progress Notes (Signed)
Pre-visit discussion using our clinic review tool. No additional management support is needed unless otherwise documented below in the visit note.  

## 2016-07-02 ENCOUNTER — Encounter: Payer: Self-pay | Admitting: Internal Medicine

## 2016-07-02 NOTE — Assessment & Plan Note (Addendum)
Increased cough and congestion and sinus pressure.  Persistent.  Treat with prednisone taper and augmentin as directed.  Nasal sprays as directed.  advair twice a day.  Rescue inhaler as needed.  Follow.  Check cxr.

## 2016-07-02 NOTE — Assessment & Plan Note (Signed)
On thyroid replacement.  Follow tsh.  

## 2016-07-02 NOTE — Assessment & Plan Note (Signed)
On 3m crestor now.  Tolerating.  Follow lipid panel and liver function tests.

## 2016-07-02 NOTE — Assessment & Plan Note (Signed)
Followed by cardiology.  Continue risk factor modification.

## 2016-07-02 NOTE — Assessment & Plan Note (Signed)
Blood pressure under good control.  Continue same medication regimen.  Follow pressures.  Follow metabolic panel.   

## 2016-07-02 NOTE — Assessment & Plan Note (Signed)
Has desired no further w/up.  Stable.

## 2016-07-02 NOTE — Assessment & Plan Note (Signed)
Stable

## 2016-07-02 NOTE — Assessment & Plan Note (Signed)
Mammogram 01/13/16 - Birads II.

## 2016-07-02 NOTE — Assessment & Plan Note (Signed)
Controlled on current regimen.  Follow.  

## 2016-07-02 NOTE — Assessment & Plan Note (Signed)
Low carb diet and exercise.  Follow met b and a1c.

## 2016-07-02 NOTE — Assessment & Plan Note (Signed)
Quit smoking two weeks ago.

## 2016-07-02 NOTE — Assessment & Plan Note (Signed)
Followed by cardiology 

## 2016-07-14 ENCOUNTER — Telehealth: Payer: Self-pay

## 2016-07-14 MED ORDER — FLUCONAZOLE 150 MG PO TABS: 150.0000 mg | ORAL_TABLET | Freq: Once | ORAL | 0 refills | Status: AC

## 2016-07-14 NOTE — Telephone Encounter (Signed)
Just finished augmentin took probiotic along with  , now  Has Thrush in mouth corner of mouth cracked , tongue "landscaped" corner of lips cracked . Lips are swollen .     Also thinks she may a yeast infection complain of vaginal  itching , no odor, vaginal discharge. X 5 days.    Tried Monistat no relief f   Would like something called in.    Has been eating yogurt.  Please advise.

## 2016-07-14 NOTE — Telephone Encounter (Signed)
Sent rx in for diflucan.  She is to take one now and then if symptoms persist, can repeat x 1 in 3 days.

## 2016-07-14 NOTE — Telephone Encounter (Signed)
Error

## 2016-07-14 NOTE — Telephone Encounter (Signed)
Pt informed will call if any further questions

## 2016-08-13 ENCOUNTER — Other Ambulatory Visit: Payer: Self-pay | Admitting: Internal Medicine

## 2016-09-22 ENCOUNTER — Telehealth: Payer: Self-pay | Admitting: Cardiovascular Disease

## 2016-09-22 NOTE — Telephone Encounter (Signed)
Patient calling the office for samples of medication:   1.  What medication and dosage are you requesting samples for? Brilinta 60 mg she takes this twice a day  2.  Are you currently out of this medication?  Has a few pills left

## 2016-09-22 NOTE — Telephone Encounter (Signed)
Notified patient samples of Brilinta 60 mg are available to pick up. Lot# [id number] Exp. 11/2016 x 4 boxes

## 2016-09-23 ENCOUNTER — Telehealth: Payer: Self-pay | Admitting: Internal Medicine

## 2016-09-23 DIAGNOSIS — E7800 Pure hypercholesterolemia, unspecified: Secondary | ICD-10-CM

## 2016-09-23 DIAGNOSIS — E038 Other specified hypothyroidism: Secondary | ICD-10-CM

## 2016-09-23 DIAGNOSIS — E119 Type 2 diabetes mellitus without complications: Secondary | ICD-10-CM

## 2016-09-23 DIAGNOSIS — I10 Essential (primary) hypertension: Secondary | ICD-10-CM

## 2016-09-23 NOTE — Telephone Encounter (Signed)
Pt has CPE on 11-08-16. Labs have been put in for Lipid, BMP, Hepatic and A1C do you need to and anything? I will call and make app with patient on Monday.

## 2016-09-23 NOTE — Telephone Encounter (Signed)
Pt called about needing labs before her CPE appt. Need orders please and thank you!

## 2016-09-23 NOTE — Telephone Encounter (Signed)
Pt declined AWV.

## 2016-09-24 NOTE — Telephone Encounter (Signed)
Lab orders placed.  Please schedule lab appt.  Thanks.

## 2016-09-27 NOTE — Telephone Encounter (Signed)
Left message to return call to our office.  

## 2016-09-28 NOTE — Telephone Encounter (Signed)
Left message to return call to our office.  

## 2016-09-29 NOTE — Telephone Encounter (Signed)
App has been made  

## 2016-11-04 ENCOUNTER — Other Ambulatory Visit: Payer: Medicare Other

## 2016-11-06 ENCOUNTER — Other Ambulatory Visit: Payer: Self-pay | Admitting: Cardiovascular Disease

## 2016-11-08 ENCOUNTER — Other Ambulatory Visit: Payer: Self-pay | Admitting: Internal Medicine

## 2016-11-08 ENCOUNTER — Encounter: Payer: Self-pay | Admitting: Internal Medicine

## 2016-11-08 ENCOUNTER — Other Ambulatory Visit (HOSPITAL_COMMUNITY)
Admission: RE | Admit: 2016-11-08 | Discharge: 2016-11-08 | Disposition: A | Discharge status: Home / Self Care | Payer: Medicare Other | Source: Ambulatory Visit | Attending: Internal Medicine | Admitting: Internal Medicine

## 2016-11-08 ENCOUNTER — Ambulatory Visit (INDEPENDENT_AMBULATORY_CARE_PROVIDER_SITE_OTHER): Payer: Medicare Other | Admitting: Internal Medicine

## 2016-11-08 VITALS — BP 130/60 | HR 89 | Temp 98.6°F | Resp 12 | Ht 65.5 in | Wt 210.2 lb

## 2016-11-08 DIAGNOSIS — Z853 Personal history of malignant neoplasm of breast: Secondary | ICD-10-CM

## 2016-11-08 DIAGNOSIS — E7800 Pure hypercholesterolemia, unspecified: Secondary | ICD-10-CM

## 2016-11-08 DIAGNOSIS — G35 Multiple sclerosis: Secondary | ICD-10-CM | POA: Diagnosis not present

## 2016-11-08 DIAGNOSIS — I2510 Atherosclerotic heart disease of native coronary artery without angina pectoris: Secondary | ICD-10-CM

## 2016-11-08 DIAGNOSIS — E119 Type 2 diabetes mellitus without complications: Secondary | ICD-10-CM | POA: Diagnosis not present

## 2016-11-08 DIAGNOSIS — E038 Other specified hypothyroidism: Secondary | ICD-10-CM

## 2016-11-08 DIAGNOSIS — Z124 Encounter for screening for malignant neoplasm of cervix: Secondary | ICD-10-CM

## 2016-11-08 DIAGNOSIS — C50411 Malignant neoplasm of upper-outer quadrant of right female breast: Secondary | ICD-10-CM | POA: Diagnosis not present

## 2016-11-08 DIAGNOSIS — K50919 Crohn's disease, unspecified, with unspecified complications: Secondary | ICD-10-CM | POA: Diagnosis not present

## 2016-11-08 DIAGNOSIS — E1159 Type 2 diabetes mellitus with other circulatory complications: Secondary | ICD-10-CM | POA: Diagnosis not present

## 2016-11-08 DIAGNOSIS — L298 Other pruritus: Secondary | ICD-10-CM

## 2016-11-08 DIAGNOSIS — J4520 Mild intermittent asthma, uncomplicated: Secondary | ICD-10-CM

## 2016-11-08 DIAGNOSIS — G35D Multiple sclerosis, unspecified: Secondary | ICD-10-CM

## 2016-11-08 DIAGNOSIS — D649 Anemia, unspecified: Secondary | ICD-10-CM

## 2016-11-08 DIAGNOSIS — K219 Gastro-esophageal reflux disease without esophagitis: Secondary | ICD-10-CM | POA: Diagnosis not present

## 2016-11-08 DIAGNOSIS — I10 Essential (primary) hypertension: Secondary | ICD-10-CM | POA: Diagnosis not present

## 2016-11-08 DIAGNOSIS — N898 Other specified noninflammatory disorders of vagina: Secondary | ICD-10-CM

## 2016-11-08 DIAGNOSIS — I6522 Occlusion and stenosis of left carotid artery: Secondary | ICD-10-CM

## 2016-11-08 DIAGNOSIS — E1165 Type 2 diabetes mellitus with hyperglycemia: Secondary | ICD-10-CM

## 2016-11-08 LAB — BASIC METABOLIC PANEL
BUN: 14 mg/dL (ref 6–23)
CHLORIDE: 100 meq/L (ref 96–112)
CO2: 27 meq/L (ref 19–32)
Calcium: 10.3 mg/dL (ref 8.4–10.5)
Creatinine, Ser: 0.95 mg/dL (ref 0.40–1.20)
GFR: 62 mL/min (ref >60.00)
GLUCOSE: 123 mg/dL — AB (ref 70–99)
POTASSIUM: 4.3 meq/L (ref 3.5–5.1)
SODIUM: 137 meq/L (ref 135–145)

## 2016-11-08 LAB — LIPID PANEL
CHOLESTEROL: 170 mg/dL (ref 0–200)
HDL: 46.5 mg/dL (ref >39.00)
LDL Cholesterol: 88 mg/dL (ref 0–99)
NonHDL: 123.91
Total CHOL/HDL Ratio: 4
Triglycerides: 181 mg/dL — ABNORMAL HIGH (ref 0.0–149.0)
VLDL: 36.2 mg/dL (ref 0.0–40.0)

## 2016-11-08 LAB — CBC WITH DIFFERENTIAL/PLATELET
BASOS PCT: 0.5 % (ref 0.0–3.0)
Basophils Absolute: 0 10*3/uL (ref 0.0–0.1)
EOS ABS: 0.5 10*3/uL (ref 0.0–0.7)
Eosinophils Relative: 5.3 % — ABNORMAL HIGH (ref 0.0–5.0)
HEMATOCRIT: 36.1 % (ref 36.0–46.0)
Hemoglobin: 11.9 g/dL — ABNORMAL LOW (ref 12.0–15.0)
LYMPHS PCT: 16.5 % (ref 12.0–46.0)
Lymphs Abs: 1.7 10*3/uL (ref 0.7–4.0)
MCHC: 33 g/dL (ref 30.0–36.0)
MCV: 75.5 fl — ABNORMAL LOW (ref 78.0–100.0)
Monocytes Absolute: 0.6 10*3/uL (ref 0.1–1.0)
Monocytes Relative: 5.5 % (ref 3.0–12.0)
NEUTROS ABS: 7.3 10*3/uL (ref 1.4–7.7)
Neutrophils Relative %: 72.2 % (ref 43.0–77.0)
PLATELETS: 267 10*3/uL (ref 150.0–400.0)
RBC: 4.78 Mil/uL (ref 3.87–5.11)
RDW: 16.2 % — AB (ref 11.5–15.5)
WBC: 10 10*3/uL (ref 4.0–10.5)

## 2016-11-08 LAB — HEPATIC FUNCTION PANEL
ALBUMIN: 4.4 g/dL (ref 3.5–5.2)
ALT: 22 U/L (ref 0–35)
AST: 24 U/L (ref 0–37)
Alkaline Phosphatase: 96 U/L (ref 39–117)
Bilirubin, Direct: 0.1 mg/dL (ref 0.0–0.3)
Total Bilirubin: 0.5 mg/dL (ref 0.2–1.2)
Total Protein: 7.5 g/dL (ref 6.0–8.3)

## 2016-11-08 LAB — MICROALBUMIN / CREATININE URINE RATIO
Creatinine,U: 36.8 mg/dL
MICROALB/CREAT RATIO: 1.9 mg/g (ref 0.0–30.0)
Microalb, Ur: <0.7 mg/dL (ref 0.0–1.9)

## 2016-11-08 LAB — TSH: TSH: 1.29 u[IU]/mL (ref 0.35–4.50)

## 2016-11-08 LAB — HEMOGLOBIN A1C: HEMOGLOBIN A1C: 7.4 % — AB (ref 4.6–6.5)

## 2016-11-08 MED ORDER — FLUTICASONE PROPIONATE HFA 110 MCG/ACT IN AERO: 2.0000 | INHALATION_SPRAY | Freq: Two times a day (BID) | RESPIRATORY_TRACT | 3 refills | Status: DC

## 2016-11-08 NOTE — Progress Notes (Signed)
Pre-visit discussion using our clinic review tool. No additional management support is needed unless otherwise documented below in the visit note.  

## 2016-11-08 NOTE — Progress Notes (Signed)
Patient ID: Haley Santos, female   DOB: [birth year], 71 y.o.   MRN: [id number]   Subjective:    Patient ID: Haley Santos, female    DOB: [birth year], 71 y.o.   MRN: [id number]  HPI  Patient with past history of breast cancer, hypercholesterolemia, MS and CAD.  She comes in today to follow up on these issues as well as for a complete physical exam.  She reports she is not getting out as much.  She is still involved with her music.  She reports she worries about her health issues.  Tries to stay active.  Breathing stable.  No increased cough or congestion.  Has been on advair for a while.  Needs to change inhaler.  Too expensive.  Wants to change to flovent and use rescue inhaler.  No abdominal pain.  No increased acid reflux.  Some vaginal irritation.  Bowels stable.     Past Medical History:  Diagnosis Date  . Asthma with bronchitis   . Breast cancer ([place]) 2014  . CAD (coronary artery disease)    a. 08/2014: inf STEMI s/p DES to RCA.   . Crohn's disease ([place])   . GERD (gastroesophageal reflux disease)   . Glaucoma   . Glucose intolerance (impaired glucose tolerance)    a. HgA1c 6.2 during 08/2014 admission   . Hypercholesterolemia   . Hyperthyroidism    a. s/p ablation maintained on synthroid  . Multiple sclerosis ([place])   . Tobacco abuse    Past Surgical History:  Procedure Laterality Date  . AUGMENTATION MAMMAPLASTY Left   . [place]  . COLONOSCOPY  2010   Dr Vira Agar  . LEFT HEART CATHETERIZATION WITH CORONARY ANGIOGRAM N/A 09/01/2014   Procedure: LEFT HEART CATHETERIZATION WITH CORONARY ANGIOGRAM;  Surgeon: Peter M Martinique, MD;  Location: [facility] CATH LAB;  Service: Cardiovascular;  Laterality: N/A;  . PORTACATH PLACEMENT  11/07/2012  . RIGHT OOPHORECTOMY  1979  . TONSILLECTOMY AND ADENOIDECTOMY  1957   Family History  Problem Relation Age of Onset  . Diabetes Mother   . Heart disease Mother        myocardial infarction  . Glaucoma  Mother   . Graves' disease Mother   . Heart attack Mother 11  . Lung disease Father   . Heart disease Unknown        half sister  . Peripheral vascular disease Unknown        half sister  . Breast cancer Neg Hx    Social History   Social History  . Marital status: Divorced    Spouse name: N/A  . Number of children: 1  . Years of education: N/A   Social History Main Topics  . Smoking status: Former Smoker    Packs/day: 0.00    Years: 40.00    Quit date: 07/01/2015  . Smokeless tobacco: Never Used  . Alcohol use 0.0 oz/week  . Drug use: No  . Sexual activity: Not Asked   Other Topics Concern  . None   Social History Narrative  . None    Outpatient Encounter Prescriptions as of 11/08/2016  Medication Sig  . albuterol (VENTOLIN HFA) 108 (90 Base) MCG/ACT inhaler INHALE TWO PUFFS BY MOUTH EVERY 6 HOURS AS NEEDED FOR WHEEZING FOR SHORTNESS OF BREATH  . aspirin 81 MG chewable tablet Chew 1 tablet (81 mg total) by mouth daily.  . CRESTOR 5 MG tablet Take 5 mg by mouth daily.   Marland Kitchen  latanoprost (XALATAN) 0.005 % ophthalmic solution Place 1-2 drops into both eyes daily.  Marland Kitchen levothyroxine (SYNTHROID, LEVOTHROID) 112 MCG tablet TAKE ONE TABLET BY MOUTH ONCE DAILY  . metoprolol tartrate (LOPRESSOR) 25 MG tablet TAKE ONE-HALF TABLET BY MOUTH TWICE DAILY  . omeprazole (PRILOSEC OTC) 20 MG tablet Take 20 mg by mouth daily.  . ticagrelor (BRILINTA) 60 MG TABS tablet Take 1 tablet (60 mg total) by mouth 2 (two) times daily.  . [DISCONTINUED] ADVAIR DISKUS 250-50 MCG/DOSE AEPB INHALE ONE DOSE BY MOUTH TWICE DAILY RINSE  MOUTH  AFTER  USE*  . fluticasone (FLOVENT HFA) 110 MCG/ACT inhaler Inhale 2 puffs into the lungs 2 (two) times daily.  . [DISCONTINUED] amoxicillin-clavulanate (AUGMENTIN) 875-125 MG tablet Take 1 tablet by mouth 2 (two) times daily.  . [DISCONTINUED] Fluticasone-Salmeterol (ADVAIR DISKUS) 250-50 MCG/DOSE AEPB INHALE ONE DOSE BY MOUTH TWICE DAILY* RINSE MOUTH AFTER EACH USE*    . [DISCONTINUED] predniSONE (DELTASONE) 10 MG tablet Take 4 tablets x 1 day and then decrease by 1/2 tablet per day until down to zero mg.   No facility-administered encounter medications on file as of 11/08/2016.     Review of Systems  Constitutional: Negative for appetite change and unexpected weight change.  HENT: Negative for congestion and sinus pressure.   Eyes: Negative for pain and visual disturbance.  Respiratory: Negative for cough and chest tightness.        No increased cough or congestion.    Cardiovascular: Negative for chest pain, palpitations and leg swelling.  Gastrointestinal: Negative for abdominal pain, diarrhea, nausea and vomiting.  Genitourinary: Negative for difficulty urinating and dysuria.       Vaginal irritation.   Musculoskeletal: Negative for back pain and joint swelling.  Skin: Negative for color change and rash.  Neurological: Negative for dizziness, light-headedness and headaches.  Hematological: Negative for adenopathy. Does not bruise/bleed easily.  Psychiatric/Behavioral: Negative for agitation and dysphoric mood.       Objective:     Blood pressure rechecked by me:  120/68  Physical Exam  Constitutional: She is oriented to person, place, and time. She appears well-developed and well-nourished. No distress.  HENT:  Nose: Nose normal.  Mouth/Throat: Oropharynx is clear and moist.  Eyes: Right eye exhibits no discharge. Left eye exhibits no discharge. No scleral icterus.  Neck: Neck supple. No thyromegaly present.  Cardiovascular: Normal rate and regular rhythm.   Pulmonary/Chest: Breath sounds normal. No accessory muscle usage. No tachypnea. No respiratory distress. She has no decreased breath sounds. She has no wheezes. She has no rhonchi. Right breast exhibits no inverted nipple, no mass, no nipple discharge and no tenderness (no axillary adenopathy). Left breast exhibits no inverted nipple, no mass, no nipple discharge and no tenderness (no  axilarry adenopathy).  Abdominal: Soft. Bowel sounds are normal. There is no tenderness.  Genitourinary:  Genitourinary Comments: Normal external genitalia.  Vaginal vault without lesions.  Cervix identified.  Pap smear performed.  Could not appreciate any adnexal masses or tenderness.    Musculoskeletal: She exhibits no edema or tenderness.  Lymphadenopathy:    She has no cervical adenopathy.  Neurological: She is alert and oriented to person, place, and time.  Skin: Skin is warm. No rash noted. No erythema.  Psychiatric: She has a normal mood and affect. Her behavior is normal.    BP 130/60 (BP Location: Left Arm, Patient Position: Sitting, Cuff Size: Normal)   Pulse 89   Temp 98.6 F (37 C) (Oral)   Resp 12  Ht 5' 5.5" (1.664 m)   Wt 210 lb 3.2 oz (95.3 kg)   LMP 06/15/1977   SpO2 97%   BMI 34.45 kg/m  Wt Readings from Last 3 Encounters:  11/08/16 210 lb 3.2 oz (95.3 kg)  07/01/16 207 lb (93.9 kg)  06/13/16 200 lb 8 oz (90.9 kg)     Lab Results  Component Value Date   WBC 10.0 11/08/2016   HGB 11.9 (L) 11/08/2016   HCT 36.1 11/08/2016   PLT 267.0 11/08/2016   GLUCOSE 123 (H) 11/08/2016   CHOL 170 11/08/2016   TRIG 181.0 (H) 11/08/2016   HDL 46.50 11/08/2016   LDLDIRECT 206.0 08/04/2015   LDLCALC 88 11/08/2016   ALT 22 11/08/2016   AST 24 11/08/2016   NA 137 11/08/2016   K 4.3 11/08/2016   CL 100 11/08/2016   CREATININE 0.95 11/08/2016   BUN 14 11/08/2016   CO2 27 11/08/2016   TSH 1.29 11/08/2016   HGBA1C 7.4 (H) 11/08/2016   MICROALBUR <0.7 11/08/2016    US Breast Ltd Uni Right Inc Axilla  Result Date: 01/13/2016 CLINICAL DATA:  71 year old female with history of right lumpectomy in 2014. Routine evaluation. EXAM: 2D DIGITAL DIAGNOSTIC BILATERAL MAMMOGRAM WITH IMPLANTS, CAD AND ADJUNCT TOMO ULTRASOUND RIGHT BREAST The patient has a left subglandular implant. Standard and implant displaced views were performed of the left breast. COMPARISON:  Previous  exam(s). ACR Breast Density Category c: The breast tissue is heterogeneously dense, which may obscure small masses. FINDINGS: No suspicious mass, calcifications, or other abnormality is identified within the left breast. A possible asymmetry was identified within the posterior, superior right breast in the region of the lumpectomy bed. Additional spot compression imaging of this region with tomosynthesis demonstrates no suspicious mass in the area of concern. On physical exam, there is scarring related to prior right lumpectomy of the superior right breast. No discrete mass is felt in the area of concern. Targeted ultrasound is performed, showing no suspicious cystic or solid sonographic finding in the area of concern within the superior right breast. Expected postsurgical changes of prior lumpectomy were seen. Mammographic images were processed with CAD. IMPRESSION: No mammographic or sonographic evidence of malignancy. RECOMMENDATION: Bilateral diagnostic mammogram in 1 year. I have discussed the findings and recommendations with the patient. Results were also provided in writing at the conclusion of the visit. If applicable, a reminder letter will be sent to the patient regarding the next appointment. BI-RADS CATEGORY  2: Benign. Electronically Signed   By: Pamelia Hoit M.D.   On: 01/13/2016 13:09   Mm Diag Breast W/implant Tomo Bilateral  Result Date: 01/13/2016 CLINICAL DATA:  71 year old female with history of right lumpectomy in 2014. Routine evaluation. EXAM: 2D DIGITAL DIAGNOSTIC BILATERAL MAMMOGRAM WITH IMPLANTS, CAD AND ADJUNCT TOMO ULTRASOUND RIGHT BREAST The patient has a left subglandular implant. Standard and implant displaced views were performed of the left breast. COMPARISON:  Previous exam(s). ACR Breast Density Category c: The breast tissue is heterogeneously dense, which may obscure small masses. FINDINGS: No suspicious mass, calcifications, or other abnormality is identified within the left  breast. A possible asymmetry was identified within the posterior, superior right breast in the region of the lumpectomy bed. Additional spot compression imaging of this region with tomosynthesis demonstrates no suspicious mass in the area of concern. On physical exam, there is scarring related to prior right lumpectomy of the superior right breast. No discrete mass is felt in the area of concern. Targeted ultrasound is performed,  showing no suspicious cystic or solid sonographic finding in the area of concern within the superior right breast. Expected postsurgical changes of prior lumpectomy were seen. Mammographic images were processed with CAD. IMPRESSION: No mammographic or sonographic evidence of malignancy. RECOMMENDATION: Bilateral diagnostic mammogram in 1 year. I have discussed the findings and recommendations with the patient. Results were also provided in writing at the conclusion of the visit. If applicable, a reminder letter will be sent to the patient regarding the next appointment. BI-RADS CATEGORY  2: Benign. Electronically Signed   By: Pamelia Hoit M.D.   On: 01/13/2016 13:09       Assessment & Plan:   Problem List Items Addressed This Visit    Asthma    Breathing stable.  Needs to stop advair secondary to increased cost.  Wants to change to flovent.  Continues rescue inhaler.        Relevant Medications   fluticasone (FLOVENT HFA) 110 MCG/ACT inhaler   CAD (coronary artery disease)    Followed by cardiology.  Discussed with her today.  Discussed risk factor modification.  Currently stable.        Crohn's disease ([place])    Stable.       Diabetes mellitus ([place])   GERD (gastroesophageal reflux disease)    Controlled on current regimen.        History of breast cancer    Mammogram 12/2015 - Birads II.  Schedule f/u mammogram.  Has declined further treatment.        Hypercholesterolemia    On crestor.  Low cholesterol diet and exercise.  Follow lipid panel and liver function  tests.        Hypertension    Blood pressure under good control.  Continue same medication regimen.  Follow pressures.  Follow metabolic panel.        Hypothyroidism    On thyroid replacement.  Follow tsh.        Multiple sclerosis ([place])    Has desires no further w/up or evaluation.  Stable.       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HCC)    Low carb diet and exercise.  Discussed with her today.  Follow met b and a1c.         Other Visit Diagnoses    Screening for cervical cancer    -  Primary   Relevant Orders   Cytology - PAP   Vaginal itching       Relevant Orders   Cervicovaginal ancillary only (Completed)       Einar Pheasant, MD

## 2016-11-08 NOTE — Progress Notes (Signed)
Orders placed for f/u labs.  

## 2016-11-10 ENCOUNTER — Telehealth: Payer: Self-pay | Admitting: Cardiovascular Disease

## 2016-11-10 LAB — CERVICOVAGINAL ANCILLARY ONLY: Wet Prep (BD Affirm): NEGATIVE

## 2016-11-10 NOTE — Telephone Encounter (Signed)
Samples placed up front for pick up.   Brilinta 60MG   4 Boxes. LOT# [id number] EXP: 09/2018

## 2016-11-10 NOTE — Telephone Encounter (Signed)
Patient calling the office for samples of medication:   1.  What medication and dosage are you requesting samples for? Haley Santos 60 mg  2.  Are you currently out of this medication?  She is about out

## 2016-11-11 ENCOUNTER — Encounter: Payer: Self-pay | Admitting: Internal Medicine

## 2016-11-11 DIAGNOSIS — Z0000 Encounter for general adult medical examination without abnormal findings: Secondary | ICD-10-CM | POA: Insufficient documentation

## 2016-11-11 LAB — CYTOLOGY - PAP
Diagnosis: NEGATIVE
HPV: NOT DETECTED

## 2016-11-11 NOTE — Assessment & Plan Note (Signed)
On crestor.  Low cholesterol diet and exercise.  Follow lipid panel and liver function tests.

## 2016-11-11 NOTE — Assessment & Plan Note (Signed)
Stable

## 2016-11-11 NOTE — Assessment & Plan Note (Signed)
Mammogram 12/2015 - Birads II.  Schedule f/u mammogram.  Has declined further treatment.

## 2016-11-11 NOTE — Assessment & Plan Note (Signed)
Breathing stable.  Needs to stop advair secondary to increased cost.  Wants to change to flovent.  Continues rescue inhaler.

## 2016-11-11 NOTE — Assessment & Plan Note (Signed)
Controlled on current regimen.   

## 2016-11-11 NOTE — Assessment & Plan Note (Signed)
Has desires no further w/up or evaluation.  Stable.

## 2016-11-11 NOTE — Assessment & Plan Note (Signed)
Low carb diet and exercise.  Discussed with her today.  Follow met b and a1c.

## 2016-11-11 NOTE — Assessment & Plan Note (Signed)
Followed by cardiology.  Discussed with her today.  Discussed risk factor modification.  Currently stable.

## 2016-11-11 NOTE — Assessment & Plan Note (Signed)
Blood pressure under good control.  Continue same medication regimen.  Follow pressures.  Follow metabolic panel.   

## 2016-11-11 NOTE — Assessment & Plan Note (Signed)
On thyroid replacement.  Follow tsh.  

## 2016-11-15 ENCOUNTER — Other Ambulatory Visit: Payer: Self-pay | Admitting: Internal Medicine

## 2016-11-15 DIAGNOSIS — Z1211 Encounter for screening for malignant neoplasm of colon: Secondary | ICD-10-CM

## 2016-11-15 NOTE — Progress Notes (Signed)
Order placed for Gi referral.

## 2016-11-17 ENCOUNTER — Other Ambulatory Visit: Payer: Medicare Other

## 2016-11-18 ENCOUNTER — Other Ambulatory Visit (INDEPENDENT_AMBULATORY_CARE_PROVIDER_SITE_OTHER): Payer: Medicare Other

## 2016-11-18 DIAGNOSIS — D649 Anemia, unspecified: Secondary | ICD-10-CM | POA: Diagnosis not present

## 2016-11-18 LAB — CBC WITH DIFFERENTIAL/PLATELET
BASOS ABS: 0 10*3/uL (ref 0.0–0.1)
Basophils Relative: 0.4 % (ref 0.0–3.0)
Eosinophils Absolute: 0.5 10*3/uL (ref 0.0–0.7)
Eosinophils Relative: 4.7 % (ref 0.0–5.0)
HEMATOCRIT: 34.1 % — AB (ref 36.0–46.0)
HEMOGLOBIN: 11.2 g/dL — AB (ref 12.0–15.0)
LYMPHS PCT: 16 % (ref 12.0–46.0)
Lymphs Abs: 1.5 10*3/uL (ref 0.7–4.0)
MCHC: 33 g/dL (ref 30.0–36.0)
MCV: 74.9 fl — ABNORMAL LOW (ref 78.0–100.0)
Monocytes Absolute: 0.5 10*3/uL (ref 0.1–1.0)
Monocytes Relative: 4.7 % (ref 3.0–12.0)
NEUTROS ABS: 7.1 10*3/uL (ref 1.4–7.7)
Neutrophils Relative %: 74.2 % (ref 43.0–77.0)
PLATELETS: 231 10*3/uL (ref 150.0–400.0)
RBC: 4.54 Mil/uL (ref 3.87–5.11)
RDW: 16.2 % — ABNORMAL HIGH (ref 11.5–15.5)
WBC: 9.6 10*3/uL (ref 4.0–10.5)

## 2016-11-18 LAB — IBC PANEL
Iron: 33 ug/dL — ABNORMAL LOW (ref 42–145)
SATURATION RATIOS: 7.3 % — AB (ref 20.0–50.0)
TRANSFERRIN: 322 mg/dL (ref 212.0–360.0)

## 2016-11-18 LAB — FERRITIN: FERRITIN: 9.5 ng/mL — AB (ref 10.0–291.0)

## 2016-11-20 ENCOUNTER — Other Ambulatory Visit: Payer: Self-pay | Admitting: Internal Medicine

## 2016-11-20 DIAGNOSIS — D509 Iron deficiency anemia, unspecified: Secondary | ICD-10-CM

## 2016-11-20 NOTE — Progress Notes (Signed)
Order placed for f/u cbc and ferritin.   

## 2016-11-21 ENCOUNTER — Other Ambulatory Visit: Payer: Self-pay | Admitting: Internal Medicine

## 2016-11-21 DIAGNOSIS — D509 Iron deficiency anemia, unspecified: Secondary | ICD-10-CM

## 2016-11-21 NOTE — Progress Notes (Signed)
Order placed for GI referral.   

## 2016-11-27 ENCOUNTER — Other Ambulatory Visit: Payer: Self-pay | Admitting: Cardiovascular Disease

## 2016-12-05 ENCOUNTER — Telehealth: Payer: Self-pay

## 2016-12-05 NOTE — Telephone Encounter (Signed)
Pt would like Brilinta samples. If she does not answer, she gives [place] permission to leave a detailed message on her voice mail.

## 2016-12-05 NOTE — Telephone Encounter (Signed)
Spoke w/ pt.  Advised her that I am leaving samples of Brilinta 60 mg at the front desk for her to p/u at her convenience.  She is appreciative and will come by tomorrow to get them.

## 2016-12-19 ENCOUNTER — Other Ambulatory Visit (INDEPENDENT_AMBULATORY_CARE_PROVIDER_SITE_OTHER): Payer: Medicare Other

## 2016-12-19 DIAGNOSIS — D509 Iron deficiency anemia, unspecified: Secondary | ICD-10-CM

## 2016-12-19 LAB — CBC WITH DIFFERENTIAL/PLATELET
BASOS ABS: 0 {cells}/uL (ref 0–200)
BASOS PCT: 0 %
EOS PCT: 6 %
Eosinophils Absolute: 396 cells/uL (ref 15–500)
HCT: 36.7 % (ref 35.0–45.0)
Hemoglobin: 11.9 g/dL (ref 11.7–15.5)
LYMPHS ABS: 1386 {cells}/uL (ref 850–3900)
Lymphocytes Relative: 21 %
MCH: 25.4 pg — ABNORMAL LOW (ref 27.0–33.0)
MCHC: 32.4 g/dL (ref 32.0–36.0)
MCV: 78.3 fL — ABNORMAL LOW (ref 80.0–100.0)
MONO ABS: 396 {cells}/uL (ref 200–950)
MPV: 9 fL (ref 7.5–12.5)
Monocytes Relative: 6 %
NEUTROS ABS: 4422 {cells}/uL (ref 1500–7800)
NEUTROS PCT: 67 %
PLATELETS: 193 10*3/uL (ref 140–400)
RBC: 4.69 MIL/uL (ref 3.80–5.10)
RDW: 19.5 % — AB (ref 11.0–15.0)
WBC: 6.6 10*3/uL (ref 3.8–10.8)

## 2016-12-19 NOTE — Addendum Note (Signed)
Addended by: Arby Barrette on: 12/19/2016 09:58 AM   Modules accepted: Orders

## 2016-12-20 ENCOUNTER — Encounter: Payer: Self-pay | Admitting: Internal Medicine

## 2016-12-20 LAB — FERRITIN: Ferritin: 25 ng/mL (ref 20–288)

## 2016-12-23 NOTE — Telephone Encounter (Signed)
Hard copy mailed

## 2017-01-05 ENCOUNTER — Telehealth: Payer: Self-pay

## 2017-01-05 NOTE — Telephone Encounter (Signed)
Pt would like Brilinta samples. 60 mg per patient. Pt states it is ok to leave a message if she does not answer.

## 2017-01-05 NOTE — Telephone Encounter (Signed)
Brilinta 60 mg tablet samples placed at front desk for pick up.

## 2017-01-13 ENCOUNTER — Ambulatory Visit
Admission: RE | Admit: 2017-01-13 | Discharge: 2017-01-13 | Disposition: A | Discharge status: Home / Self Care | Payer: Medicare Other | Source: Ambulatory Visit | Attending: Internal Medicine | Admitting: Internal Medicine

## 2017-01-13 ENCOUNTER — Other Ambulatory Visit: Payer: Self-pay | Admitting: Internal Medicine

## 2017-01-13 DIAGNOSIS — Z08 Encounter for follow-up examination after completed treatment for malignant neoplasm: Secondary | ICD-10-CM | POA: Diagnosis not present

## 2017-01-13 DIAGNOSIS — R922 Inconclusive mammogram: Secondary | ICD-10-CM | POA: Diagnosis not present

## 2017-01-13 DIAGNOSIS — C50411 Malignant neoplasm of upper-outer quadrant of right female breast: Secondary | ICD-10-CM

## 2017-01-13 DIAGNOSIS — Z853 Personal history of malignant neoplasm of breast: Secondary | ICD-10-CM

## 2017-01-18 ENCOUNTER — Telehealth: Payer: Self-pay | Admitting: Cardiovascular Disease

## 2017-01-18 NOTE — Telephone Encounter (Signed)
LMOV stating samples are placed up front.   Brilinta 60MG    2 boxes  LOT# [id number]  EXP: 09/2018

## 2017-01-18 NOTE — Telephone Encounter (Signed)
Patient calling the office for samples of medication:   1.  What medication and dosage are you requesting samples for? Brilinta  2.  Are you currently out of this medication?  Will be out soon

## 2017-02-01 ENCOUNTER — Telehealth: Payer: Self-pay

## 2017-02-01 NOTE — Telephone Encounter (Signed)
Patient aware of samples and coupon card placed up front.    1 box of Brilinta 60MG  LOT# [id number] EXP: 09/2018

## 2017-02-01 NOTE — Telephone Encounter (Signed)
Pt would like Brlinta 60 mg samples. States she takes this twice a day. Ok to leave a detailed msg.

## 2017-02-02 ENCOUNTER — Other Ambulatory Visit: Payer: Self-pay

## 2017-02-02 DIAGNOSIS — D509 Iron deficiency anemia, unspecified: Secondary | ICD-10-CM | POA: Diagnosis not present

## 2017-02-02 DIAGNOSIS — K5090 Crohn's disease, unspecified, without complications: Secondary | ICD-10-CM | POA: Diagnosis not present

## 2017-02-02 DIAGNOSIS — K219 Gastro-esophageal reflux disease without esophagitis: Secondary | ICD-10-CM | POA: Diagnosis not present

## 2017-02-02 MED ORDER — TICAGRELOR 60 MG PO TABS: 60.0000 mg | ORAL_TABLET | Freq: Two times a day (BID) | ORAL | 3 refills | Status: DC

## 2017-02-02 NOTE — Telephone Encounter (Signed)
Written Rx printed for patient to take to pharmacy.  Patient picked up 02/02/2017 at 1:33 pm.

## 2017-02-22 DIAGNOSIS — D509 Iron deficiency anemia, unspecified: Secondary | ICD-10-CM | POA: Diagnosis not present

## 2017-03-10 ENCOUNTER — Encounter: Payer: Self-pay | Admitting: Internal Medicine

## 2017-03-10 ENCOUNTER — Ambulatory Visit (INDEPENDENT_AMBULATORY_CARE_PROVIDER_SITE_OTHER): Payer: Medicare Other | Admitting: Internal Medicine

## 2017-03-10 VITALS — BP 126/62 | HR 76 | Temp 98.1°F | Resp 14 | Ht 66.0 in | Wt 212.4 lb

## 2017-03-10 DIAGNOSIS — Z720 Tobacco use: Secondary | ICD-10-CM

## 2017-03-10 DIAGNOSIS — K219 Gastro-esophageal reflux disease without esophagitis: Secondary | ICD-10-CM

## 2017-03-10 DIAGNOSIS — G35D Multiple sclerosis, unspecified: Secondary | ICD-10-CM

## 2017-03-10 DIAGNOSIS — K50919 Crohn's disease, unspecified, with unspecified complications: Secondary | ICD-10-CM

## 2017-03-10 DIAGNOSIS — D509 Iron deficiency anemia, unspecified: Secondary | ICD-10-CM

## 2017-03-10 DIAGNOSIS — E7800 Pure hypercholesterolemia, unspecified: Secondary | ICD-10-CM | POA: Diagnosis not present

## 2017-03-10 DIAGNOSIS — Z853 Personal history of malignant neoplasm of breast: Secondary | ICD-10-CM | POA: Diagnosis not present

## 2017-03-10 DIAGNOSIS — E038 Other specified hypothyroidism: Secondary | ICD-10-CM

## 2017-03-10 DIAGNOSIS — E1159 Type 2 diabetes mellitus with other circulatory complications: Secondary | ICD-10-CM | POA: Diagnosis not present

## 2017-03-10 DIAGNOSIS — G35 Multiple sclerosis: Secondary | ICD-10-CM | POA: Diagnosis not present

## 2017-03-10 DIAGNOSIS — L989 Disorder of the skin and subcutaneous tissue, unspecified: Secondary | ICD-10-CM

## 2017-03-10 DIAGNOSIS — I2510 Atherosclerotic heart disease of native coronary artery without angina pectoris: Secondary | ICD-10-CM

## 2017-03-10 DIAGNOSIS — I10 Essential (primary) hypertension: Secondary | ICD-10-CM | POA: Diagnosis not present

## 2017-03-10 DIAGNOSIS — J4520 Mild intermittent asthma, uncomplicated: Secondary | ICD-10-CM

## 2017-03-10 DIAGNOSIS — I6522 Occlusion and stenosis of left carotid artery: Secondary | ICD-10-CM

## 2017-03-10 DIAGNOSIS — E1165 Type 2 diabetes mellitus with hyperglycemia: Secondary | ICD-10-CM

## 2017-03-10 NOTE — Progress Notes (Signed)
Patient ID: Haley Santos, female   DOB: 08/08/1945, 71 y.o.   MRN: [id number]   Subjective:    Patient ID: Haley Santos, female    DOB: June 14, 1945, 71 y.o.   MRN: [id number]  HPI  Patient here for a scheduled follow up.  She reports she is feeling better.  Breathing stable.  Still smoking.  Discussed the need to quit.  No chest pain.  No increased cough or congestion.  No abdominal pain.  Bowels moving.  Has mole right breast. Discussed referral to dermatology.  She agrees and request to see Dr Phillip Heal.  [facility] for evaluation of anemia.  Is scheduled for EGD and colonoscopy.  She informs me today she wants to postpone procedures.  She is off iron.  Stopped taking two weeks ago.  Discussed her last labs.  Discussed diet and exercise.  Discussed low carb diet.  Handling stress.     Past Medical History:  Diagnosis Date  . Asthma with bronchitis   . Breast cancer ([place]) 2014  . CAD (coronary artery disease)    a. 08/2014: inf STEMI s/p DES to RCA.   . Crohn's disease ([place])   . GERD (gastroesophageal reflux disease)   . Glaucoma   . Glucose intolerance (impaired glucose tolerance)    a. HgA1c 6.2 during 08/2014 admission   . Hypercholesterolemia   . Hyperthyroidism    a. s/p ablation maintained on synthroid  . Multiple sclerosis ([place])   . Tobacco abuse    Past Surgical History:  Procedure Laterality Date  . AUGMENTATION MAMMAPLASTY Left   . [place]  . COLONOSCOPY  2010   Dr Vira Agar  . LEFT HEART CATHETERIZATION WITH CORONARY ANGIOGRAM N/A 09/01/2014   Procedure: LEFT HEART CATHETERIZATION WITH CORONARY ANGIOGRAM;  Surgeon: Peter M Martinique, MD;  Location: [facility] CATH LAB;  Service: Cardiovascular;  Laterality: N/A;  . PORTACATH PLACEMENT  11/07/2012  . RIGHT OOPHORECTOMY  1979  . TONSILLECTOMY AND ADENOIDECTOMY  1957   Family History  Problem Relation Age of Onset  . Diabetes Mother   . Heart disease Mother        myocardial infarction  .  Glaucoma Mother   . Graves' disease Mother   . Heart attack Mother 63  . Lung disease Father   . Heart disease Unknown        half sister  . Peripheral vascular disease Unknown        half sister  . Breast cancer Neg Hx    Social History   Social History  . Marital status: Divorced    Spouse name: N/A  . Number of children: 1  . Years of education: N/A   Social History Main Topics  . Smoking status: Former Smoker    Packs/day: 0.00    Years: 40.00    Quit date: 07/01/2015  . Smokeless tobacco: Never Used  . Alcohol use 0.0 oz/week  . Drug use: No  . Sexual activity: Not Asked   Other Topics Concern  . None   Social History Narrative  . None    Outpatient Encounter Prescriptions as of 03/10/2017  Medication Sig  . albuterol (VENTOLIN HFA) 108 (90 Base) MCG/ACT inhaler INHALE TWO PUFFS BY MOUTH EVERY 6 HOURS AS NEEDED FOR WHEEZING FOR SHORTNESS OF BREATH  . aspirin 81 MG chewable tablet Chew 1 tablet (81 mg total) by mouth daily.  . CRESTOR 5 MG tablet Take 5 mg by mouth daily.   Marland Kitchen  fluticasone (FLOVENT HFA) 110 MCG/ACT inhaler Inhale 2 puffs into the lungs 2 (two) times daily.  Marland Kitchen latanoprost (XALATAN) 0.005 % ophthalmic solution Place 1-2 drops into both eyes daily.  Marland Kitchen levothyroxine (SYNTHROID, LEVOTHROID) 112 MCG tablet TAKE ONE TABLET BY MOUTH ONCE DAILY  . metoprolol tartrate (LOPRESSOR) 25 MG tablet TAKE ONE-HALF TABLET BY MOUTH TWICE DAILY  . omeprazole (PRILOSEC OTC) 20 MG tablet Take 20 mg by mouth daily.  . ticagrelor (BRILINTA) 60 MG TABS tablet Take 1 tablet (60 mg total) by mouth 2 (two) times daily.  . [DISCONTINUED] rosuvastatin (CRESTOR) 10 MG tablet TAKE ONE TABLET BY MOUTH AT BEDTIME   No facility-administered encounter medications on file as of 03/10/2017.     Review of Systems  Constitutional: Negative for appetite change and unexpected weight change.  HENT: Negative for congestion and sinus pressure.   Respiratory: Negative for cough, chest  tightness and shortness of breath.   Cardiovascular: Negative for chest pain, palpitations and leg swelling.  Gastrointestinal: Negative for abdominal pain, diarrhea, nausea and vomiting.  Genitourinary: Negative for difficulty urinating and dysuria.  Musculoskeletal: Negative for joint swelling and myalgias.  Skin: Negative for color change and rash.  Neurological: Negative for dizziness, light-headedness and headaches.  Psychiatric/Behavioral: Negative for agitation and dysphoric mood.       Objective:     Blood pressure rechecked by me:  134/70  Physical Exam  Constitutional: She appears well-developed and well-nourished. No distress.  HENT:  Nose: Nose normal.  Mouth/Throat: Oropharynx is clear and moist.  Neck: Neck supple. No thyromegaly present.  Cardiovascular: Normal rate and regular rhythm.   Pulmonary/Chest: Breath sounds normal. No respiratory distress. She has no wheezes.  Abdominal: Soft. Bowel sounds are normal. There is no tenderness.  Musculoskeletal: She exhibits no edema or tenderness.  Lymphadenopathy:    She has no cervical adenopathy.  Skin: No rash noted. No erythema.  Psychiatric: She has a normal mood and affect. Her behavior is normal.    BP 126/62 (BP Location: Left Arm, Patient Position: Sitting, Cuff Size: Normal)   Pulse 76   Temp 98.1 F (36.7 C) (Oral)   Resp 14   Ht 5' 6" (1.676 m)   Wt 212 lb 6.4 oz (96.3 kg)   LMP 06/15/1977   SpO2 98%   BMI 34.28 kg/m  Wt Readings from Last 3 Encounters:  03/10/17 212 lb 6.4 oz (96.3 kg)  11/08/16 210 lb 3.2 oz (95.3 kg)  07/01/16 207 lb (93.9 kg)     Lab Results  Component Value Date   WBC 6.6 12/19/2016   HGB 11.9 12/19/2016   HCT 36.7 12/19/2016   PLT 193 12/19/2016   GLUCOSE 123 (H) 11/08/2016   CHOL 170 11/08/2016   TRIG 181.0 (H) 11/08/2016   HDL 46.50 11/08/2016   LDLDIRECT 206.0 08/04/2015   LDLCALC 88 11/08/2016   ALT 22 11/08/2016   AST 24 11/08/2016   NA 137 11/08/2016   K  4.3 11/08/2016   CL 100 11/08/2016   CREATININE 0.95 11/08/2016   BUN 14 11/08/2016   CO2 27 11/08/2016   TSH 1.29 11/08/2016   HGBA1C 7.4 (H) 11/08/2016   MICROALBUR <0.7 11/08/2016    Mm Diag Breast Tomo Bilateral  Result Date: 01/13/2017 CLINICAL DATA:  History of right breast cancer in 2014 status post breast conservation surgery. EXAM: 2D DIGITAL DIAGNOSTIC BILATERAL MAMMOGRAM WITH CAD AND ADJUNCT TOMO COMPARISON:  Previous exam(s). ACR Breast Density Category c: The breast tissue is heterogeneously dense,  which may obscure small masses. FINDINGS: There are stable postsurgical changes within the right breast. There are no new dominant masses, suspicious calcifications or secondary signs of malignancy within either breast. The left prepectoral implant appears grossly stable in position and configuration. Mammographic images were processed with CAD. IMPRESSION: No evidence of malignancy within either breast. RECOMMENDATION: Bilateral diagnostic mammogram in 1 year. I have discussed the findings and recommendations with the patient. Results were also provided in writing at the conclusion of the visit. If applicable, a reminder letter will be sent to the patient regarding the next appointment. BI-RADS CATEGORY  2: Benign. Electronically Signed   By: Franki Cabot M.D.   On: 01/13/2017 11:38       Assessment & Plan:   Problem List Items Addressed This Visit    Anemia    Saw GI.  Have her scheduled for EGD and colonoscopy.  She wants to postpone.  Discussed with her today. She will notify them when agreeable.  Off iron.  Recheck cbc and ferritin.        Asthma    Breathing stable.        CAD (coronary artery disease)    Followed by cardiology.  Stable.  Continue risk factor modification.        Carotid artery stenosis    Followed by cardiology.        Crohn's disease ([place])    Just saw GI.  Was scheduled for colonoscopy and EGD.  She is planning to post pone.  Discussed with her  today.  She will notify them when agreeable for scopes.        GERD (gastroesophageal reflux disease)    Controlled on current regimen.  Follow.        History of breast cancer    Mammogram 01/13/17 - Birads II.        Hypercholesterolemia    On crestor.  Low cholesterol diet and exercise.  Unable to increase dose of crestor.  Follow lipid panel and liver function tests.        Hypertension    Blood pressure under good control.  Continue same medication regimen.  Follow pressures.  Follow metabolic panel.        Hypothyroidism    On thyroid replacement.  Follow tsh.       Multiple sclerosis ([place])    Desires no further evaluation or treatment.  Follow.       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HCC)    Low carb diet and exercise.  Follow met b and a1c.  Keep up to date with eye checks.  She is scheduled to see them soon.        Tobacco abuse    Discussed the need to quit smoking.  She declines to quit at this time.  Follow.         Other Visit Diagnoses    Skin lesion    -  Primary   Skin lesion right breast.  refer to dermatology.     Relevant Orders   Ambulatory referral to Dermatology       Einar Pheasant, MD

## 2017-03-12 ENCOUNTER — Encounter: Payer: Self-pay | Admitting: Internal Medicine

## 2017-03-12 DIAGNOSIS — D649 Anemia, unspecified: Secondary | ICD-10-CM | POA: Insufficient documentation

## 2017-03-12 NOTE — Assessment & Plan Note (Signed)
Low carb diet and exercise.  Follow met b and a1c.  Keep up to date with eye checks.  She is scheduled to see them soon.   

## 2017-03-12 NOTE — Assessment & Plan Note (Signed)
Just saw GI.  Was scheduled for colonoscopy and EGD.  She is planning to post pone.  Discussed with her today.  She will notify them when agreeable for scopes.

## 2017-03-12 NOTE — Assessment & Plan Note (Signed)
Followed by cardiology.  Stable.  Continue risk factor modification.

## 2017-03-12 NOTE — Assessment & Plan Note (Signed)
On thyroid replacement.  Follow tsh.  

## 2017-03-12 NOTE — Assessment & Plan Note (Signed)
Saw GI.  Have her scheduled for EGD and colonoscopy.  She wants to postpone.  Discussed with her today. She will notify them when agreeable.  Off iron.  Recheck cbc and ferritin.

## 2017-03-12 NOTE — Assessment & Plan Note (Signed)
Followed by cardiology 

## 2017-03-12 NOTE — Assessment & Plan Note (Signed)
Desires no further evaluation or treatment.  Follow.

## 2017-03-12 NOTE — Assessment & Plan Note (Signed)
Blood pressure under good control.  Continue same medication regimen.  Follow pressures.  Follow metabolic panel.   

## 2017-03-12 NOTE — Assessment & Plan Note (Signed)
Controlled on current regimen.  Follow.  

## 2017-03-12 NOTE — Assessment & Plan Note (Signed)
Mammogram 01/13/17 - Birads II.

## 2017-03-12 NOTE — Assessment & Plan Note (Signed)
Breathing stable.

## 2017-03-12 NOTE — Assessment & Plan Note (Signed)
On crestor.  Low cholesterol diet and exercise.  Unable to increase dose of crestor.  Follow lipid panel and liver function tests.

## 2017-03-12 NOTE — Assessment & Plan Note (Signed)
Discussed the need to quit smoking.  She declines to quit at this time.  Follow.

## 2017-03-13 NOTE — Progress Notes (Signed)
Cardiology Office Note  Date:  03/14/2017   ID:  RODERICK SWEEZY, DOB 1945/07/19, MRN [id number]  PCP:  Einar Pheasant, MD   Chief Complaint  Patient presents with  . other    6 month follow up. Patient would like to discuss Brilinta. Meds reviewed verbally with patient.     HPI:  Ms. Sami is a 71 yo woman with  obesity,  long history of smoking who continues to smoke,  Hyperlipidemia, intolerant of statins,  GERD,  hypertension   moderate carotid arterial disease on the left, 50% STEMI in 2016 with stent placed to her mid RCA,  Patient reports history of Crohn's Diabetes type 2 with hemoglobin A1c 7.4 who presents for routine follow-up of her coronary artery disease, hyperlipidemia  Still smoking not ready to quit,  Still with tachycardia, Does not want more metoprolol ("too slow") Taking Crestor , having myalgia on 5 mg daily, back to 1/2 dose Total chol 170, happy with a number, does not think she can get it lower Worried about adding Zetia as she reports having Crohn's  Other lab work reviewed with her in detail HBA1C 7.4  Tachycardia at night, lasting 30 seconds or so, sits up and does heavy coughing and symptoms go away.  She does not want more metoprolol   EKG personally reviewed by myself on todays visit Shows NSR   Rate 93 bpm, nonspec ST abn  Past medical history reviewed April 2016 she developed acute chest pain, bilateral arm pain, neck pain/jaw pain bilaterally.  She had cardiac catheterization, essentially occluded mid RCA with 50-70% mid LAD disease, DES placed the RCA. History of breast cancer, Adriamycin through port in the left chest Carotid ultrasound with 50-69% carotid stenosis  In follow-up today, she reports that she is working on her diet, has had difficulty with weight loss, trying to eat less sugar Overall feels well with no complaints, no chest pain concerning for angina She continues to smoke, Having difficulty controlling her  diabetes Was off her cholesterol medication, now takes Crestor days per week  EKG on today's visit shows normal sinus rhythm with rate 79 bpm, no significant ST or T-wave changes  Other past medical history She has seen [facility] in the past and unable to tolerate statin secondary to myalgias. She reports a history of MS  Review of her carotid ultrasound with her shows 50-69% disease on the left Review of her lab work with her shows total cholesterol 260. Elevated hemoglobin A1c greater than 6   PMH:   has a past medical history of Asthma with bronchitis; Breast cancer ([place]) (2014); CAD (coronary artery disease); Crohn's disease ([place]); GERD (gastroesophageal reflux disease); Glaucoma; Glucose intolerance (impaired glucose tolerance); Hypercholesterolemia; Hyperthyroidism; Multiple sclerosis ([place]); and Tobacco abuse.  PSH:    Past Surgical History:  Procedure Laterality Date  . AUGMENTATION MAMMAPLASTY Left   . [place]  . COLONOSCOPY  2010   Dr Vira Agar  . LEFT HEART CATHETERIZATION WITH CORONARY ANGIOGRAM N/A 09/01/2014   Procedure: LEFT HEART CATHETERIZATION WITH CORONARY ANGIOGRAM;  Surgeon: Peter M Martinique, MD;  Location: [facility] CATH LAB;  Service: Cardiovascular;  Laterality: N/A;  . PORTACATH PLACEMENT  11/07/2012  . RIGHT OOPHORECTOMY  1979  . TONSILLECTOMY AND ADENOIDECTOMY  1957    Current Outpatient Prescriptions  Medication Sig Dispense Refill  . albuterol (VENTOLIN HFA) 108 (90 Base) MCG/ACT inhaler INHALE TWO PUFFS BY MOUTH EVERY 6 HOURS AS NEEDED FOR  WHEEZING FOR SHORTNESS OF BREATH 162 each 0  . aspirin 81 MG chewable tablet Chew 1 tablet (81 mg total) by mouth daily.    . CRESTOR 5 MG tablet Take 5 mg by mouth daily.     . fluticasone (FLOVENT HFA) 110 MCG/ACT inhaler Inhale 2 puffs into the lungs 2 (two) times daily. 1 Inhaler 3  . latanoprost (XALATAN) 0.005 % ophthalmic solution Place 1-2 drops into both eyes daily.     Marland Kitchen levothyroxine (SYNTHROID, LEVOTHROID) 112 MCG tablet TAKE ONE TABLET BY MOUTH ONCE DAILY 90 tablet 3  . metoprolol tartrate (LOPRESSOR) 25 MG tablet TAKE ONE-HALF TABLET BY MOUTH TWICE DAILY 60 tablet 6  . omeprazole (PRILOSEC OTC) 20 MG tablet Take 20 mg by mouth daily.    . ticagrelor (BRILINTA) 60 MG TABS tablet Take 1 tablet (60 mg total) by mouth 2 (two) times daily. 180 tablet 3   No current facility-administered medications for this visit.      Allergies:   Statins; Clindamycin/lincomycin; Codeine; Shellfish allergy; and Valium [diazepam]   Social History:  The patient  reports that she quit smoking about 20 months ago. She smoked 0.00 packs per day for 40.00 years. She has never used smokeless tobacco. She reports that she drinks alcohol. She reports that she does not use drugs.   Family History:   family history includes Diabetes in her mother; Glaucoma in her mother; Berenice Primas' disease in her mother; Heart attack (age of onset: 64) in her mother; Heart disease in her mother and unknown relative; Lung disease in her father; Peripheral vascular disease in her unknown relative.    Review of Systems: Review of Systems  Constitutional: Negative.   Respiratory: Negative.   Cardiovascular: Positive for palpitations.       Tachycardia  Gastrointestinal: Negative.   Musculoskeletal: Negative.   Neurological: Negative.   Psychiatric/Behavioral: Negative.   All other systems reviewed and are negative.    PHYSICAL EXAM: VS:  BP 140/60 (BP Location: Left Arm, Patient Position: Sitting, Cuff Size: Large)   Pulse 93   Ht 5\' 5"  (1.651 m)   Wt 213 lb 4 oz (96.7 kg)   LMP 06/15/1977   BMI 35.49 kg/m  , BMI Body mass index is 35.49 kg/m. GEN: Well nourished, well developed, in no acute distress , obese HEENT: normal  Neck: no JVD, carotid bruits, or masses Cardiac: RRR; no murmurs, rubs, or gallops,no edema  Respiratory:   mildly decreased breath sounds throughout, normal work of  breathing GI: soft, nontender, nondistended, + BS MS: no deformity or atrophy  Skin: warm and dry, no rash Neuro:  Strength and sensation are intact Psych: euthymic mood, full affect    Recent Labs: 11/08/2016: ALT 22; BUN 14; Creatinine, Ser 0.95; Potassium 4.3; Sodium 137; TSH 1.29 12/19/2016: Hemoglobin 11.9; Platelets 193    Lipid Panel Lab Results  Component Value Date   CHOL 170 11/08/2016   HDL 46.50 11/08/2016   LDLCALC 88 11/08/2016   TRIG 181.0 (H) 11/08/2016      Wt Readings from Last 3 Encounters:  03/14/17 213 lb 4 oz (96.7 kg)  03/10/17 212 lb 6.4 oz (96.3 kg)  11/08/16 210 lb 3.2 oz (95.3 kg)       ASSESSMENT AND PLAN:  Tachycardia She does not want workup at this time including event monitor She does not want extra metoprolol, makes her feel heart rate is too slow Happy to take very low doses of metoprolol  Essential hypertension - Plan:  EKG 12-Lead Blood pressure is well controlled on today's visit. No changes made to the medications.  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 No further workup at this time. Continue current medication regimen.  Coronary artery disease involving native coronary artery of native heart without angina pectoris -  Currently with no symptoms of angina. No further workup at this time. Continue current medication regimen.  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[place]) - Plan: EKG 12-Lead We have encouraged continued exercise, careful diet management in an effort to lose weight. Long discussion with her concerning her diet  Tobacco abuse  she continues to smoke daily She does not want  assistance, discussed various strategies to quit smoking Does not seem ready to quit  Stenosis of left carotid artery Significant stenosis, recommended smoking cessation, aggressive lipid management Recommended she start Zetia to reach goal LDL less than 70   Total encounter time more than 25 minutes  Greater than 50% was spent in counseling and coordination of care with the patient   Disposition:   F/U  6 months   No orders of the defined types were placed in this encounter.    Signed, Esmond Plants, M.D., Ph.D. 03/14/2017  [place], [place]

## 2017-03-14 ENCOUNTER — Ambulatory Visit (INDEPENDENT_AMBULATORY_CARE_PROVIDER_SITE_OTHER): Payer: Medicare Other | Admitting: Cardiovascular Disease

## 2017-03-14 ENCOUNTER — Other Ambulatory Visit (INDEPENDENT_AMBULATORY_CARE_PROVIDER_SITE_OTHER): Payer: Medicare Other

## 2017-03-14 ENCOUNTER — Encounter: Payer: Self-pay | Admitting: Cardiovascular Disease

## 2017-03-14 VITALS — BP 140/60 | HR 93 | Ht 65.0 in | Wt 213.2 lb

## 2017-03-14 DIAGNOSIS — I25118 Atherosclerotic heart disease of native coronary artery with other forms of angina pectoris: Secondary | ICD-10-CM | POA: Diagnosis not present

## 2017-03-14 DIAGNOSIS — G35 Multiple sclerosis: Secondary | ICD-10-CM

## 2017-03-14 DIAGNOSIS — G35D Multiple sclerosis, unspecified: Secondary | ICD-10-CM

## 2017-03-14 DIAGNOSIS — D509 Iron deficiency anemia, unspecified: Secondary | ICD-10-CM | POA: Diagnosis not present

## 2017-03-14 DIAGNOSIS — E7800 Pure hypercholesterolemia, unspecified: Secondary | ICD-10-CM | POA: Diagnosis not present

## 2017-03-14 DIAGNOSIS — I10 Essential (primary) hypertension: Secondary | ICD-10-CM

## 2017-03-14 DIAGNOSIS — E1165 Type 2 diabetes mellitus with hyperglycemia: Secondary | ICD-10-CM

## 2017-03-14 DIAGNOSIS — R000 Tachycardia, unspecified: Secondary | ICD-10-CM | POA: Diagnosis not present

## 2017-03-14 DIAGNOSIS — E1159 Type 2 diabetes mellitus with other circulatory complications: Secondary | ICD-10-CM

## 2017-03-14 DIAGNOSIS — I6522 Occlusion and stenosis of left carotid artery: Secondary | ICD-10-CM | POA: Diagnosis not present

## 2017-03-14 LAB — CBC WITH DIFFERENTIAL/PLATELET
BASOS PCT: 0.4 % (ref 0.0–3.0)
Basophils Absolute: 0 10*3/uL (ref 0.0–0.1)
EOS PCT: 5.9 % — AB (ref 0.0–5.0)
Eosinophils Absolute: 0.4 10*3/uL (ref 0.0–0.7)
HCT: 42.4 % (ref 36.0–46.0)
Hemoglobin: 13.9 g/dL (ref 12.0–15.0)
LYMPHS ABS: 1.4 10*3/uL (ref 0.7–4.0)
Lymphocytes Relative: 19.8 % (ref 12.0–46.0)
MCHC: 32.7 g/dL (ref 30.0–36.0)
MCV: 85.5 fl (ref 78.0–100.0)
MONO ABS: 0.5 10*3/uL (ref 0.1–1.0)
MONOS PCT: 6.2 % (ref 3.0–12.0)
NEUTROS ABS: 5 10*3/uL (ref 1.4–7.7)
NEUTROS PCT: 67.7 % (ref 43.0–77.0)
PLATELETS: 196 10*3/uL (ref 150.0–400.0)
RBC: 4.96 Mil/uL (ref 3.87–5.11)
RDW: 15.1 % (ref 11.5–15.5)
WBC: 7.3 10*3/uL (ref 4.0–10.5)

## 2017-03-14 LAB — FERRITIN: Ferritin: 28.3 ng/mL (ref 10.0–291.0)

## 2017-03-14 LAB — HEPATIC FUNCTION PANEL
ALBUMIN: 4 g/dL (ref 3.5–5.2)
ALT: 24 U/L (ref 0–35)
AST: 23 U/L (ref 0–37)
Alkaline Phosphatase: 82 U/L (ref 39–117)
BILIRUBIN DIRECT: 0.1 mg/dL (ref 0.0–0.3)
TOTAL PROTEIN: 6.9 g/dL (ref 6.0–8.3)
Total Bilirubin: 0.5 mg/dL (ref 0.2–1.2)

## 2017-03-14 LAB — BASIC METABOLIC PANEL
BUN: 16 mg/dL (ref 6–23)
CALCIUM: 9.7 mg/dL (ref 8.4–10.5)
CO2: 30 meq/L (ref 19–32)
CREATININE: 0.8 mg/dL (ref 0.40–1.20)
Chloride: 102 mEq/L (ref 96–112)
GFR: 75.53 mL/min (ref >60.00)
GLUCOSE: 140 mg/dL — AB (ref 70–99)
Potassium: 4.4 mEq/L (ref 3.5–5.1)
Sodium: 140 mEq/L (ref 135–145)

## 2017-03-14 LAB — LIPID PANEL
Cholesterol: 187 mg/dL (ref 0–200)
HDL: 45.6 mg/dL (ref >39.00)
NONHDL: 141.63
TRIGLYCERIDES: 238 mg/dL — AB (ref 0.0–149.0)
Total CHOL/HDL Ratio: 4
VLDL: 47.6 mg/dL — ABNORMAL HIGH (ref 0.0–40.0)

## 2017-03-14 LAB — HEMOGLOBIN A1C: Hgb A1c MFr Bld: 6.9 % — ABNORMAL HIGH (ref 4.6–6.5)

## 2017-03-14 LAB — LDL CHOLESTEROL, DIRECT: Direct LDL: 116 mg/dL

## 2017-03-14 MED ORDER — EZETIMIBE 10 MG PO TABS: 10.0000 mg | ORAL_TABLET | Freq: Every day | ORAL | 6 refills | Status: DC

## 2017-03-14 MED ORDER — CLOPIDOGREL BISULFATE 75 MG PO TABS: 75.0000 mg | ORAL_TABLET | Freq: Every day | ORAL | 3 refills | Status: DC

## 2017-03-14 NOTE — Patient Instructions (Signed)
Medication Instructions:   Please stop brilinta when you run out Change to plavix one a day (75 mg)  Labwork:  No new labs needed  Testing/Procedures:  No further testing at this time   Follow-Up: It was a pleasure seeing you in the office today. Please call us if you have new issues that need to be addressed before your next appt.  [phone]  Your physician wants you to follow-up in: 6 months.  You will receive a reminder letter in the mail two months in advance. If you don't receive a letter, please call our office to schedule the follow-up appointment.  If you need a refill on your cardiac medications before your next appointment, please call your pharmacy.

## 2017-03-22 DIAGNOSIS — L853 Xerosis cutis: Secondary | ICD-10-CM | POA: Diagnosis not present

## 2017-03-22 DIAGNOSIS — L821 Other seborrheic keratosis: Secondary | ICD-10-CM | POA: Diagnosis not present

## 2017-04-02 IMAGING — MG MM DIAG BREAST W/IMPLANT TOMO BILATERAL
8 of 20 series · 8 of 40 positions shown · non-contrast
Comparison: Previous examinations the most recent of which is dated
10/23/2013.

CLINICAL DATA: History of milk malignant lumpectomy of the right
breast in 8167. Followup evaluation.

EXAM:
DIGITAL DIAGNOSTIC BILATERAL MAMMOGRAM WITH IMPLANTS, 3D
TOMOSYNTHESIS WITH CAD
The patient has a left breast retroglandular implant. Standard and
implant displaced views were performed.

[R CC (1 of 2)]
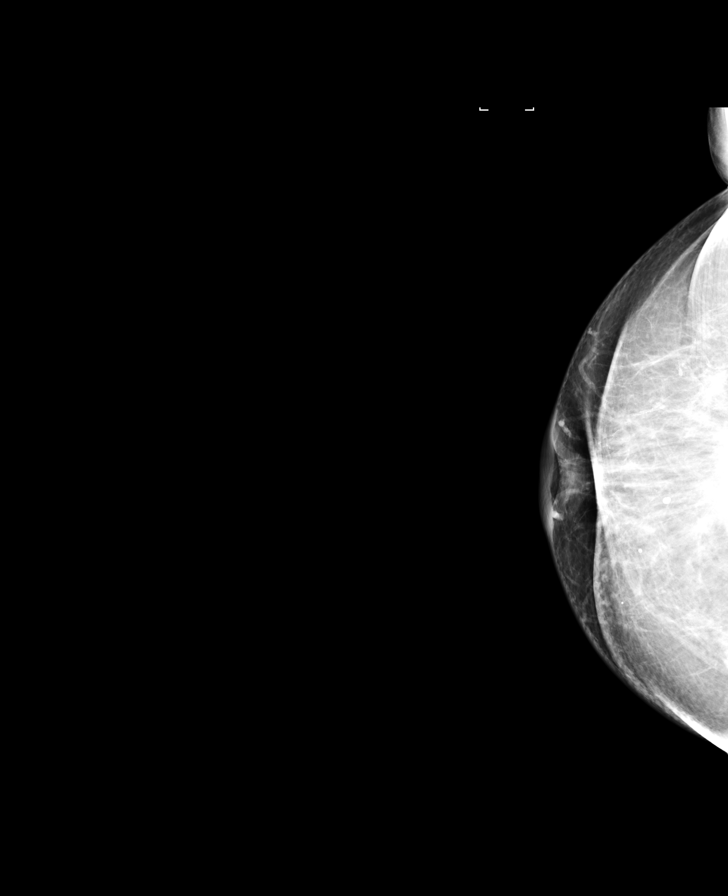

[R ML (1 of 2)]
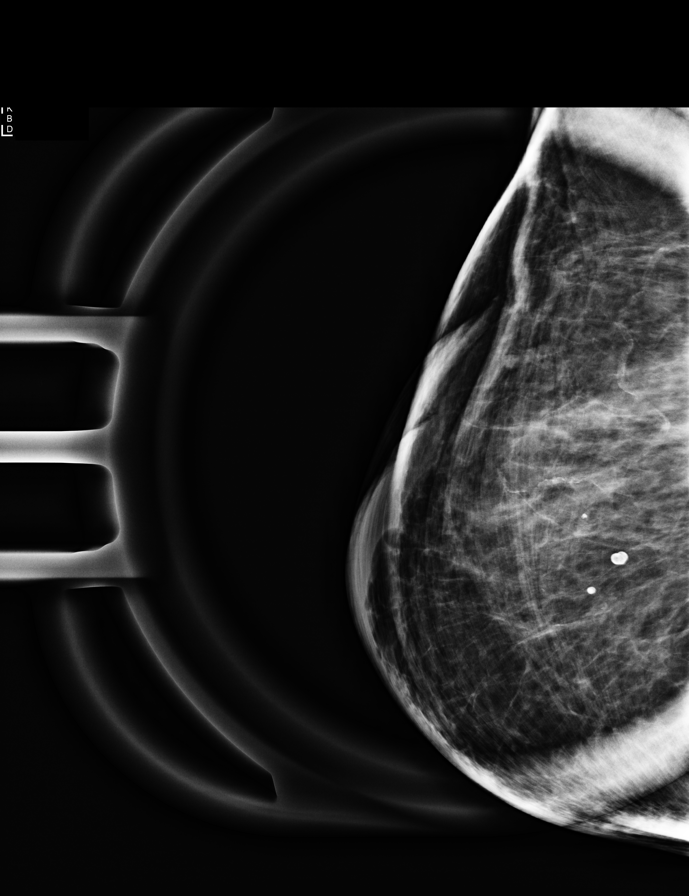

[L CC]
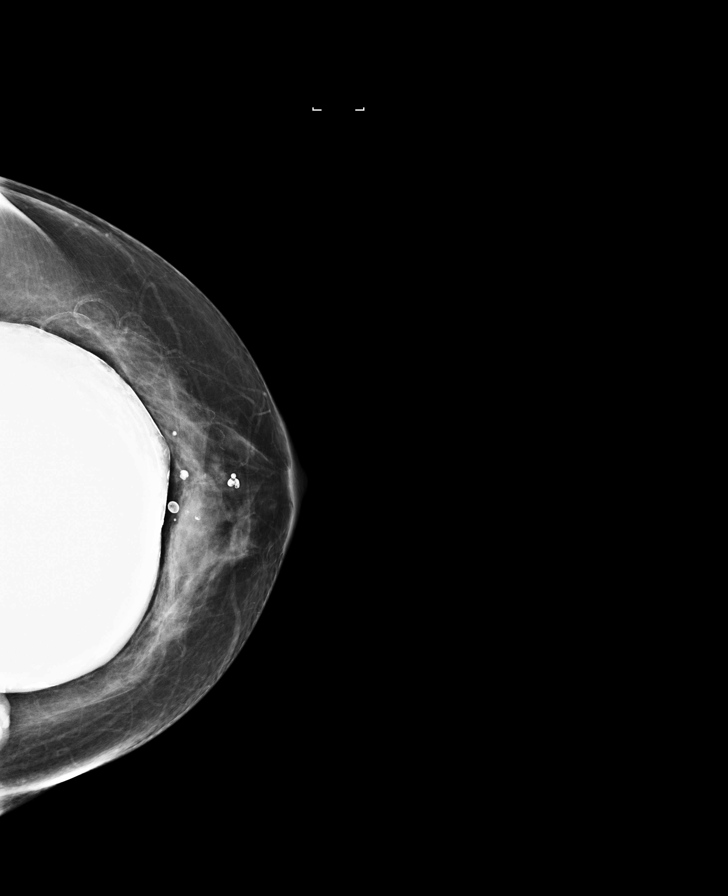

[L MLO]
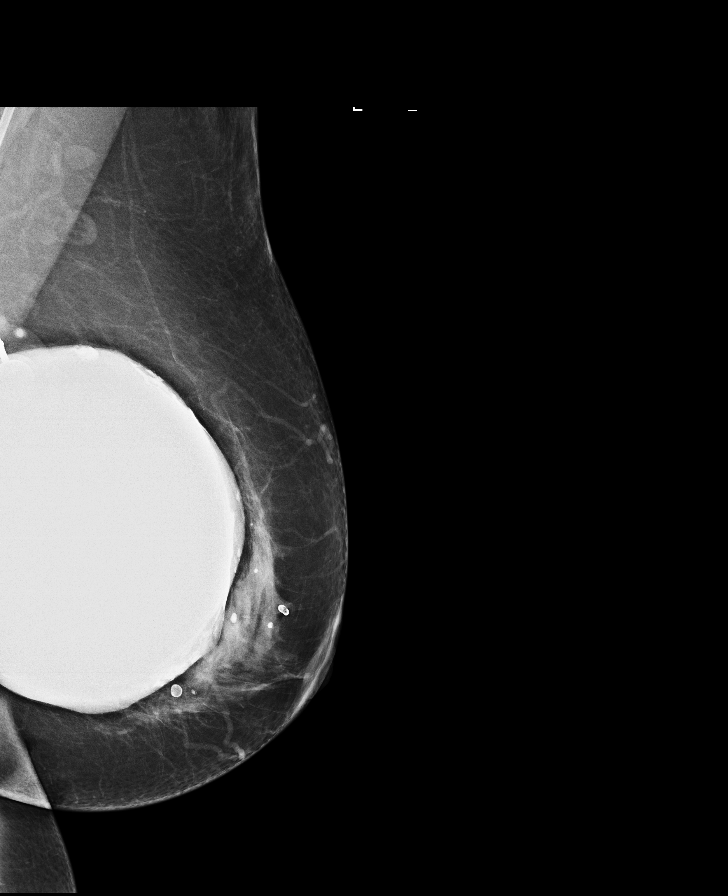

[R ML (2 of 2)]
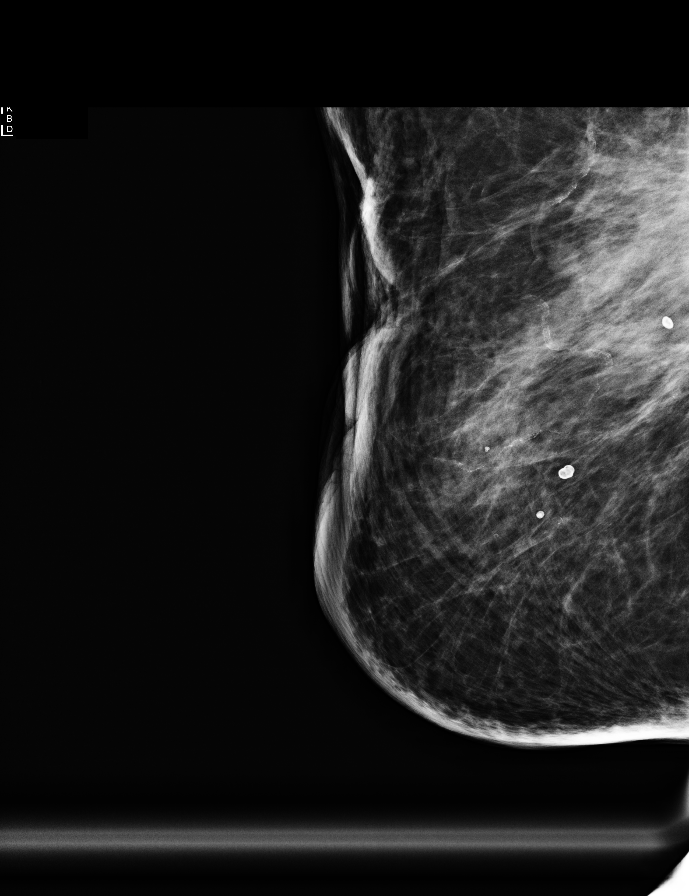

[R MLO synth-2D]
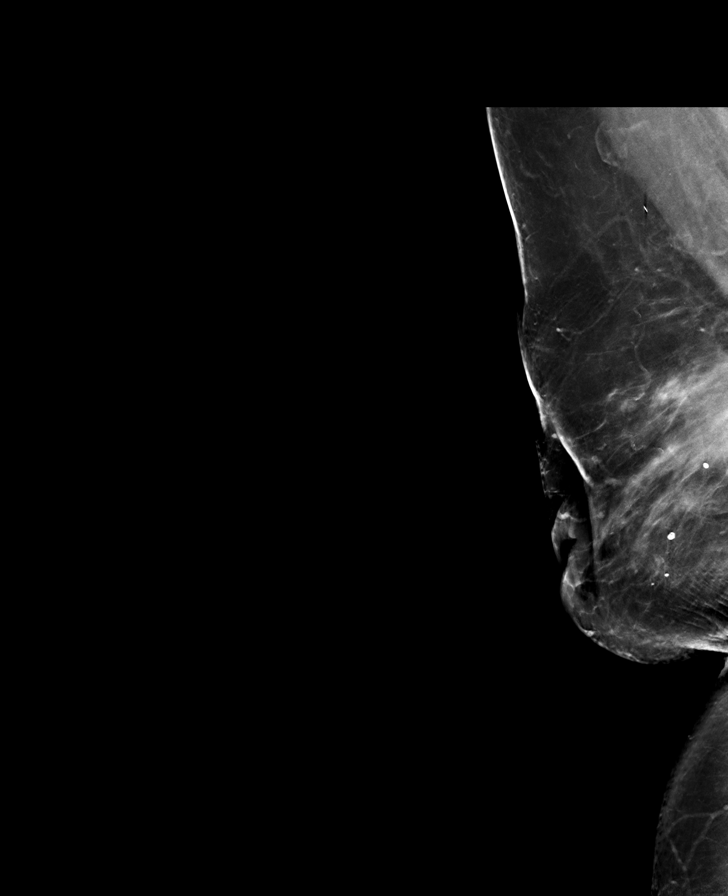

[R CC (2 of 2)]
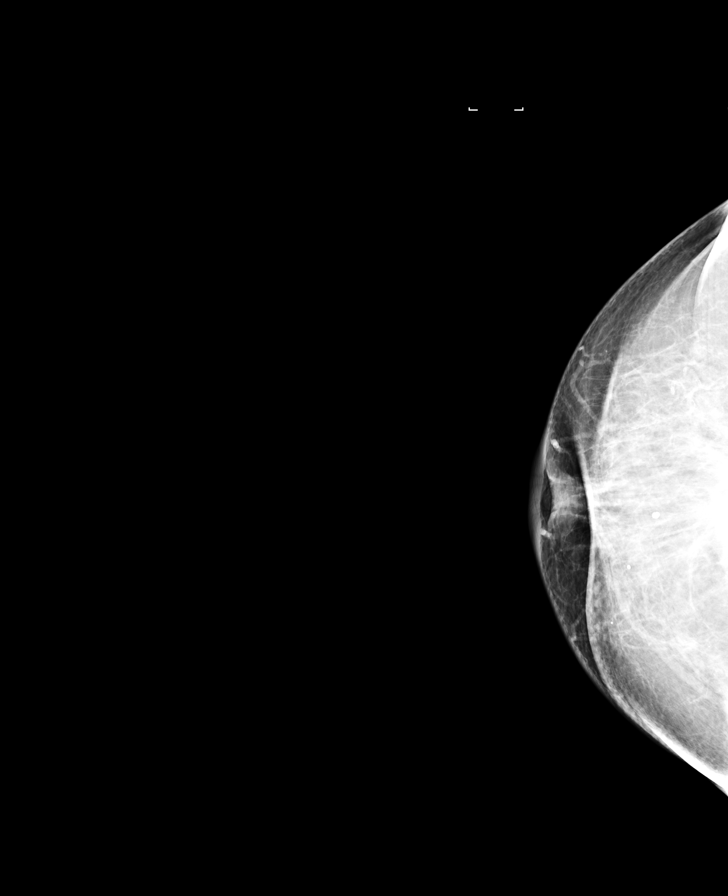

[L CC synth-2D]
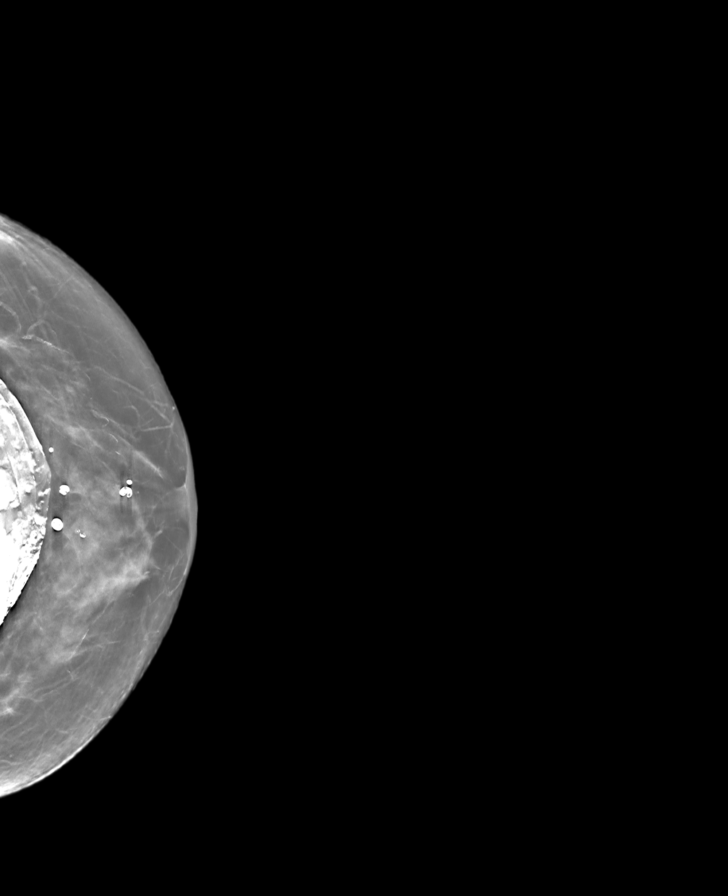

[8 of 40 positions shown; findings below may reference images not displayed]

ACR Breast Density Category b: There are scattered areas of
fibroglandular density.
FINDINGS: There are scarring changes located within the central/superior right
breast related to the patient's lumpectomy. There is no specific
evidence for recurrent tumor or developing malignancy within either
breast.

Mammographic images were processed with CAD.
IMPRESSION: Stable breast parenchymal pattern. No findings worrisome for
recurrent tumor or developing malignancy.

RECOMMENDATION:
Bilateral diagnostic mammography in 1 year.

I have discussed the findings and recommendations with the patient.
Results were also provided in writing at the conclusion of the
visit. If applicable, a reminder letter will be sent to the patient
regarding the next appointment.

BI-RADS CATEGORY  2: Benign.

## 2017-04-15 ENCOUNTER — Other Ambulatory Visit: Payer: Self-pay | Admitting: Internal Medicine

## 2017-04-24 ENCOUNTER — Encounter: Admission: RE | Payer: Self-pay | Source: Ambulatory Visit

## 2017-04-24 ENCOUNTER — Ambulatory Visit
Admission: RE | Admit: 2017-04-24 | Payer: Medicare Other | Source: Ambulatory Visit | Admitting: Unknown Physician Specialty

## 2017-04-24 SURGERY — ESOPHAGOGASTRODUODENOSCOPY (EGD) WITH PROPOFOL
Anesthesia: General

## 2017-05-04 ENCOUNTER — Other Ambulatory Visit: Payer: Self-pay | Admitting: Cardiovascular Disease

## 2017-05-04 ENCOUNTER — Other Ambulatory Visit: Payer: Self-pay | Admitting: Internal Medicine

## 2017-05-31 DIAGNOSIS — H2512 Age-related nuclear cataract, left eye: Secondary | ICD-10-CM | POA: Diagnosis not present

## 2017-06-16 ENCOUNTER — Telehealth: Payer: Self-pay | Admitting: Internal Medicine

## 2017-06-16 MED ORDER — ALBUTEROL SULFATE HFA 108 (90 BASE) MCG/ACT IN AERS: INHALATION_SPRAY | RESPIRATORY_TRACT | 0 refills | Status: DC

## 2017-06-16 NOTE — Telephone Encounter (Signed)
Copied from [place] [id number]. Topic: Inquiry >> Jun 16, 2017  2:34 PM Neva Seat wrote: [employer] [facility]SA   Pt needs this approved and sent to   [place], [place] [facility] [place] [address] [place] [address] Phone: [phone] Fax: [phone]

## 2017-07-10 DIAGNOSIS — H2512 Age-related nuclear cataract, left eye: Secondary | ICD-10-CM | POA: Diagnosis not present

## 2017-07-11 ENCOUNTER — Encounter: Payer: Self-pay | Admitting: *Deleted

## 2017-07-11 NOTE — Anesthesia Pain Management Evaluation Note (Signed)
NO BP/IV RIGHT ARM 

## 2017-07-11 NOTE — Pre-Procedure Instructions (Signed)
NO IV  / / BP RIGHT ARM

## 2017-07-19 ENCOUNTER — Encounter
Admission: RE | Disposition: A | Discharge status: Home / Self Care | Payer: Self-pay | Source: Ambulatory Visit | Attending: Ophthalmology

## 2017-07-19 ENCOUNTER — Ambulatory Visit
Admission: RE | Admit: 2017-07-19 | Discharge: 2017-07-19 | Disposition: A | Discharge status: Home / Self Care | Payer: Medicare Other | Source: Ambulatory Visit | Attending: Ophthalmology | Admitting: Ophthalmology

## 2017-07-19 ENCOUNTER — Ambulatory Visit: Payer: Medicare Other | Admitting: Certified Registered Nurse Anesthetist

## 2017-07-19 ENCOUNTER — Encounter: Payer: Self-pay | Admitting: Certified Registered Nurse Anesthetist

## 2017-07-19 DIAGNOSIS — Z853 Personal history of malignant neoplasm of breast: Secondary | ICD-10-CM | POA: Insufficient documentation

## 2017-07-19 DIAGNOSIS — E039 Hypothyroidism, unspecified: Secondary | ICD-10-CM | POA: Diagnosis not present

## 2017-07-19 DIAGNOSIS — J449 Chronic obstructive pulmonary disease, unspecified: Secondary | ICD-10-CM | POA: Diagnosis not present

## 2017-07-19 DIAGNOSIS — I10 Essential (primary) hypertension: Secondary | ICD-10-CM | POA: Diagnosis not present

## 2017-07-19 DIAGNOSIS — Z955 Presence of coronary angioplasty implant and graft: Secondary | ICD-10-CM | POA: Diagnosis not present

## 2017-07-19 DIAGNOSIS — I252 Old myocardial infarction: Secondary | ICD-10-CM | POA: Diagnosis not present

## 2017-07-19 DIAGNOSIS — I2510 Atherosclerotic heart disease of native coronary artery without angina pectoris: Secondary | ICD-10-CM | POA: Diagnosis not present

## 2017-07-19 DIAGNOSIS — H2512 Age-related nuclear cataract, left eye: Secondary | ICD-10-CM | POA: Diagnosis not present

## 2017-07-19 DIAGNOSIS — Z79899 Other long term (current) drug therapy: Secondary | ICD-10-CM | POA: Insufficient documentation

## 2017-07-19 DIAGNOSIS — K219 Gastro-esophageal reflux disease without esophagitis: Secondary | ICD-10-CM | POA: Insufficient documentation

## 2017-07-19 DIAGNOSIS — Z7982 Long term (current) use of aspirin: Secondary | ICD-10-CM | POA: Insufficient documentation

## 2017-07-19 DIAGNOSIS — E119 Type 2 diabetes mellitus without complications: Secondary | ICD-10-CM | POA: Diagnosis not present

## 2017-07-19 DIAGNOSIS — E7800 Pure hypercholesterolemia, unspecified: Secondary | ICD-10-CM | POA: Diagnosis not present

## 2017-07-19 DIAGNOSIS — F17200 Nicotine dependence, unspecified, uncomplicated: Secondary | ICD-10-CM | POA: Diagnosis not present

## 2017-07-19 HISTORY — DX: Acute myocardial infarction, unspecified: I21.9

## 2017-07-19 HISTORY — DX: Chronic obstructive pulmonary disease, unspecified: J44.9

## 2017-07-19 HISTORY — DX: Dyspnea, unspecified: R06.00

## 2017-07-19 HISTORY — PX: CATARACT EXTRACTION W/PHACO: SHX586

## 2017-07-19 HISTORY — DX: Essential (primary) hypertension: I10

## 2017-07-19 HISTORY — DX: Myoneural disorder, unspecified: G70.9

## 2017-07-19 HISTORY — DX: Hypothyroidism, unspecified: E03.9

## 2017-07-19 SURGERY — PHACOEMULSIFICATION, CATARACT, WITH IOL INSERTION
Anesthesia: Monitor Anesthesia Care | Site: Eye | Laterality: Left | Wound class: Clean

## 2017-07-19 MED ORDER — NA CHONDROIT SULF-NA HYALURON 40-17 MG/ML IO SOLN
INTRAOCULAR | Status: DC | PRN
  Administered 2017-07-19: 1 mL via INTRAOCULAR

## 2017-07-19 MED ORDER — MOXIFLOXACIN HCL 0.5 % OP SOLN
OPHTHALMIC | Status: AC
  Filled 2017-07-19: qty 3

## 2017-07-19 MED ORDER — SODIUM CHLORIDE 0.9 % IV SOLN
INTRAVENOUS | Status: DC
  Administered 2017-07-19: 08:00:00 via INTRAVENOUS

## 2017-07-19 MED ORDER — MIDAZOLAM HCL 2 MG/2ML IJ SOLN
INTRAMUSCULAR | Status: AC
  Filled 2017-07-19: qty 2

## 2017-07-19 MED ORDER — MOXIFLOXACIN HCL 0.5 % OP SOLN
OPHTHALMIC | Status: DC | PRN
  Administered 2017-07-19: 0.2 mL via OPHTHALMIC

## 2017-07-19 MED ORDER — FENTANYL CITRATE (PF) 100 MCG/2ML IJ SOLN
INTRAMUSCULAR | Status: AC
  Filled 2017-07-19: qty 2

## 2017-07-19 MED ORDER — EPINEPHRINE PF 1 MG/ML IJ SOLN
INTRAMUSCULAR | Status: AC
Start: 2017-07-19 — End: 2017-07-19
  Filled 2017-07-19: qty 2

## 2017-07-19 MED ORDER — FENTANYL CITRATE (PF) 100 MCG/2ML IJ SOLN
INTRAMUSCULAR | Status: DC | PRN
  Administered 2017-07-19 (×2): 25 ug via INTRAVENOUS
  Administered 2017-07-19: 50 ug via INTRAVENOUS

## 2017-07-19 MED ORDER — ARMC OPHTHALMIC DILATING DROPS
OPHTHALMIC | Status: AC
  Administered 2017-07-19: 1 via OPHTHALMIC
  Filled 2017-07-19: qty 0.4

## 2017-07-19 MED ORDER — EPINEPHRINE PF 1 MG/ML IJ SOLN
INTRAOCULAR | Status: DC | PRN
  Administered 2017-07-19: 09:00:00 via OPHTHALMIC

## 2017-07-19 MED ORDER — NA CHONDROIT SULF-NA HYALURON 40-17 MG/ML IO SOLN
INTRAOCULAR | Status: AC
Start: 2017-07-19 — End: 2017-07-19
  Filled 2017-07-19: qty 1

## 2017-07-19 MED ORDER — LIDOCAINE HCL (PF) 4 % IJ SOLN
INTRAOCULAR | Status: DC | PRN
  Administered 2017-07-19: 4 mL via OPHTHALMIC

## 2017-07-19 MED ORDER — ARMC OPHTHALMIC DILATING DROPS
1.0000 "application " | OPHTHALMIC | Status: AC
  Administered 2017-07-19 (×3): 1 via OPHTHALMIC

## 2017-07-19 MED ORDER — MOXIFLOXACIN HCL 0.5 % OP SOLN: 1.0000 [drp] | OPHTHALMIC | Status: DC | PRN

## 2017-07-19 MED ORDER — POVIDONE-IODINE 5 % OP SOLN
OPHTHALMIC | Status: AC
  Filled 2017-07-19: qty 30

## 2017-07-19 MED ORDER — POVIDONE-IODINE 5 % OP SOLN
OPHTHALMIC | Status: DC | PRN
  Administered 2017-07-19: 1 via OPHTHALMIC

## 2017-07-19 MED ORDER — CARBACHOL 0.01 % IO SOLN
INTRAOCULAR | Status: DC | PRN
  Administered 2017-07-19: 0.5 mL via INTRAOCULAR

## 2017-07-19 MED ORDER — LIDOCAINE HCL (PF) 4 % IJ SOLN
INTRAMUSCULAR | Status: AC
Start: 2017-07-19 — End: 2017-07-19
  Filled 2017-07-19: qty 5

## 2017-07-19 SURGICAL SUPPLY — 16 items
GLOVE BIO SURGEON STRL SZ8 (GLOVE) ×3 IMPLANT
GLOVE BIOGEL M 6.5 STRL (GLOVE) ×3 IMPLANT
GLOVE SURG LX 8.0 MICRO (GLOVE) ×2
GLOVE SURG LX STRL 8.0 MICRO (GLOVE) ×1 IMPLANT
GOWN STRL REUS W/ TWL LRG LVL3 (GOWN DISPOSABLE) ×2 IMPLANT
GOWN STRL REUS W/TWL LRG LVL3 (GOWN DISPOSABLE) ×4
LABEL CATARACT MEDS ST (LABEL) ×3 IMPLANT
LENS IOL TECNIS ITEC 20.0 (Intraocular Lens) ×3 IMPLANT
PACK CATARACT (MISCELLANEOUS) ×3 IMPLANT
PACK CATARACT BRASINGTON LX (MISCELLANEOUS) ×3 IMPLANT
PACK EYE AFTER SURG (MISCELLANEOUS) ×3 IMPLANT
SOL BSS BAG (MISCELLANEOUS) ×3
SOLUTION BSS BAG (MISCELLANEOUS) ×1 IMPLANT
SYR 5ML LL (SYRINGE) ×3 IMPLANT
WATER STERILE IRR 250ML POUR (IV SOLUTION) ×3 IMPLANT
WIPE NON LINTING 3.25X3.25 (MISCELLANEOUS) ×3 IMPLANT

## 2017-07-19 NOTE — Anesthesia Preprocedure Evaluation (Addendum)
Anesthesia Evaluation  Patient identified by MRN, date of birth, ID band Patient awake    Reviewed: Allergy & Precautions, NPO status , Patient's Chart, lab work & pertinent test results  Airway Mallampati: III  TM Distance: <3 FB     Dental   Pulmonary shortness of breath, asthma , COPD, Current Smoker,    Pulmonary exam normal        Cardiovascular hypertension, + CAD, + Past MI and + Peripheral Vascular Disease  Normal cardiovascular exam     Neuro/Psych  Neuromuscular disease    GI/Hepatic GERD  ,  Endo/Other  diabetesHypothyroidism   Renal/GU      Musculoskeletal   Abdominal Normal abdominal exam  (+)   Peds  Hematology  (+) anemia ,   Anesthesia Other Findings Past Medical History: No date: Asthma with bronchitis 2014: Breast cancer ([place]) No date: CAD (coronary artery disease)     Comment:  a. 08/2014: inf STEMI s/p DES to RCA.  No date: COPD (chronic obstructive pulmonary disease) (HCC) No date: Crohn's disease ([place]) No date: Dyspnea     Comment:  DOE No date: GERD (gastroesophageal reflux disease) No date: Glaucoma No date: Glucose intolerance (impaired glucose tolerance)     Comment:  a. HgA1c 6.2 during 08/2014 admission  No date: Hypercholesterolemia No date: Hypertension No date: Hyperthyroidism     Comment:  a. s/p ablation maintained on synthroid No date: Hypothyroidism No date: Multiple sclerosis ([place]) No date: Myocardial infarction [facility])     Comment:  2016 No date: Neuromuscular disorder ([place])     Comment:  MS No date: Tobacco abuse  Reproductive/Obstetrics                             Anesthesia Physical Anesthesia Plan  ASA: III  Anesthesia Plan: MAC   Post-op Pain Management:    Induction: Intravenous  PONV Risk Score and Plan:   Airway Management Planned: Nasal Cannula  Additional Equipment:   Intra-op Plan:   Post-operative Plan:   Informed  Consent: I have reviewed the patients History and Physical, chart, labs and discussed the procedure including the risks, benefits and alternatives for the proposed anesthesia with the patient or authorized representative who has indicated his/her understanding and acceptance.   Dental advisory given  Plan Discussed with: CRNA and Surgeon  Anesthesia Plan Comments:         Anesthesia Quick Evaluation

## 2017-07-19 NOTE — Anesthesia Postprocedure Evaluation (Signed)
Anesthesia Post Note  Patient: Haley Santos  Procedure(s) Performed: CATARACT EXTRACTION PHACO AND INTRAOCULAR LENS PLACEMENT (IOC) (Left Eye)  Patient location during evaluation: PACU Anesthesia Type: MAC Level of consciousness: awake and alert and oriented Pain management: pain level controlled Vital Signs Assessment: post-procedure vital signs reviewed and stable Respiratory status: spontaneous breathing Cardiovascular status: blood pressure returned to baseline Anesthetic complications: no     Last Vitals:  Vitals:   07/19/17 0855 07/19/17 0906  BP: (!) 104/54 (!) 102/54  Pulse: 71 70  Resp: 16 16  Temp: (!) 36 C   SpO2: 98% 98%    Last Pain:  Vitals:   07/19/17 0855  TempSrc: Temporal                 Joely Losier

## 2017-07-19 NOTE — Transfer of Care (Signed)
Immediate Anesthesia Transfer of Care Note  Patient: Haley Santos  Procedure(s) Performed: CATARACT EXTRACTION PHACO AND INTRAOCULAR LENS PLACEMENT (IOC) (Left Eye)  Patient Location: Short Stay  Anesthesia Type:MAC  Level of Consciousness: awake, alert , oriented and patient cooperative  Airway & Oxygen Therapy: Patient Spontanous Breathing  Post-op Assessment: Report given to RN and Post -op Vital signs reviewed and stable  Post vital signs: Reviewed and stable  Last Vitals:  Vitals:   07/19/17 0729  BP: (!) 141/58  Pulse: 79  Resp: 17  Temp: 37.1 C  SpO2: 98%    Last Pain:  Vitals:   07/19/17 0729  TempSrc: Oral         Complications: No apparent anesthesia complications

## 2017-07-19 NOTE — Op Note (Signed)
PREOPERATIVE DIAGNOSIS:  Nuclear sclerotic cataract of the left eye.   POSTOPERATIVE DIAGNOSIS:  Nuclear sclerotic cataract of the left eye.   OPERATIVE PROCEDURE: Procedure(s): CATARACT EXTRACTION PHACO AND INTRAOCULAR LENS PLACEMENT (IOC)   SURGEON:  Birder Robson, MD.   ANESTHESIA:  Anesthesiologist: Alvin Critchley, MD CRNA: Eben Burow, CRNA  1.      Managed anesthesia care. 2.     0.35m of Shugarcaine was instilled following the paracentesis   COMPLICATIONS:  None.   TECHNIQUE:   Stop and chop   DESCRIPTION OF PROCEDURE:  The patient was examined and consented in the preoperative holding area where the aforementioned topical anesthesia was applied to the left eye and then brought back to the Operating Room where the left eye was prepped and draped in the usual sterile ophthalmic fashion and a lid speculum was placed. A paracentesis was created with the side port blade and the anterior chamber was filled with viscoelastic. A near clear corneal incision was performed with the steel keratome. A continuous curvilinear capsulorrhexis was performed with a cystotome followed by the capsulorrhexis forceps. Hydrodissection and hydrodelineation were carried out with BSS on a blunt cannula. The lens was removed in a stop and chop  technique and the remaining cortical material was removed with the irrigation-aspiration handpiece. The capsular bag was inflated with viscoelastic and the Technis ZCB00 lens was placed in the capsular bag without complication. The remaining viscoelastic was removed from the eye with the irrigation-aspiration handpiece. The wounds were hydrated. The anterior chamber was flushed with Miostat and the eye was inflated to physiologic pressure. 0.150mVigamox was placed in the anterior chamber. The wounds were found to be water tight. The eye was dressed with Vigamox. The patient was given protective glasses to wear throughout the day and a shield with which to sleep tonight.  The patient was also given drops with which to begin a drop regimen today and will follow-up with me in one day. Implant Name Type Inv. Item Serial No. Manufacturer Lot No. LRB No. Used  LENS IOL DIOP 20.0 - [id number] Intraocular Lens LENS IOL DIOP 20.0 [id number]  Left 1    Procedure(s) with comments: CATARACT EXTRACTION PHACO AND INTRAOCULAR LENS PLACEMENT (IOC) (Left) - [place]0:49.0 AP% 14.5 CDE 7.10 Fluid Pack Lot # [id number]  Electronically signed: WiBirder Robson/10/2017 8:57 AM

## 2017-07-19 NOTE — Discharge Instructions (Signed)
Eye Surgery Discharge Instructions  Expect mild scratchy sensation or mild soreness. DO NOT RUB YOUR EYE!  The day of surgery:  Minimal physical activity, but bed rest is not required  No reading, computer work, or close hand work  No bending, lifting, or straining.  May watch TV  For 24 hours:  No driving, legal decisions, or alcoholic beverages  Safety precautions  Eat anything you prefer: It is better to start with liquids, then soup then solid foods.  _____ Eye patch should be worn until postoperative exam tomorrow.  ____ Solar shield eyeglasses should be worn for comfort in the sunlight/patch while sleeping  Resume all regular medications including aspirin or Coumadin if these were discontinued prior to surgery. You may shower, bathe, shave, or wash your hair. Tylenol may be taken for mild discomfort.  Call your doctor if you experience significant pain, nausea, or vomiting, fever > 101 or other signs of infection. [phone] or [phone] Specific instructions:  Follow-up Information    Birder Robson, MD Follow up.   Specialty:  Ophthalmology Why:  March 7 at [facility] information: [address] [place] [address] [phone]

## 2017-07-19 NOTE — H&P (Signed)
All labs reviewed. Abnormal studies sent to patients PCP when indicated.  Previous H&P reviewed, patient examined, there are NO CHANGES.  Haley Limpert Porfilio3/6/20198:27 AM

## 2017-07-19 NOTE — Anesthesia Post-op Follow-up Note (Signed)
Anesthesia QCDR form completed.        

## 2017-07-21 ENCOUNTER — Encounter: Payer: Self-pay | Admitting: Internal Medicine

## 2017-07-21 ENCOUNTER — Ambulatory Visit (INDEPENDENT_AMBULATORY_CARE_PROVIDER_SITE_OTHER): Payer: Medicare Other | Admitting: Internal Medicine

## 2017-07-21 DIAGNOSIS — E7800 Pure hypercholesterolemia, unspecified: Secondary | ICD-10-CM

## 2017-07-21 DIAGNOSIS — Z853 Personal history of malignant neoplasm of breast: Secondary | ICD-10-CM

## 2017-07-21 DIAGNOSIS — G35 Multiple sclerosis: Secondary | ICD-10-CM | POA: Diagnosis not present

## 2017-07-21 DIAGNOSIS — J4520 Mild intermittent asthma, uncomplicated: Secondary | ICD-10-CM

## 2017-07-21 DIAGNOSIS — I6522 Occlusion and stenosis of left carotid artery: Secondary | ICD-10-CM | POA: Diagnosis not present

## 2017-07-21 DIAGNOSIS — E038 Other specified hypothyroidism: Secondary | ICD-10-CM

## 2017-07-21 DIAGNOSIS — K50919 Crohn's disease, unspecified, with unspecified complications: Secondary | ICD-10-CM

## 2017-07-21 DIAGNOSIS — Z720 Tobacco use: Secondary | ICD-10-CM

## 2017-07-21 DIAGNOSIS — I25118 Atherosclerotic heart disease of native coronary artery with other forms of angina pectoris: Secondary | ICD-10-CM

## 2017-07-21 DIAGNOSIS — K219 Gastro-esophageal reflux disease without esophagitis: Secondary | ICD-10-CM

## 2017-07-21 DIAGNOSIS — E119 Type 2 diabetes mellitus without complications: Secondary | ICD-10-CM | POA: Diagnosis not present

## 2017-07-21 DIAGNOSIS — I10 Essential (primary) hypertension: Secondary | ICD-10-CM | POA: Diagnosis not present

## 2017-07-21 NOTE — Progress Notes (Signed)
Patient ID: Haley Santos, female   DOB: [birth year], 72 y.o.   MRN: [id number]   Subjective:    Patient ID: Haley Santos, female    DOB: Sep 07, 1945, 72 y.o.   MRN: [id number]  HPI  Patient here for a scheduled follow up.  She reports she is doing relatively well.  Trying to stay active.  No chest pain.  No sob.  No acid reflux.  No abdominal pain.  Bowels moving.  Still smoking.  Discussed the need to stop smoking.  She declines to quit.  Discussed screening CT chest.  She declines.  Some right shoulder pain.  Plans to f/u with ortho.     Past Medical History:  Diagnosis Date  . Asthma with bronchitis   . Breast cancer ([place]) 2014  . CAD (coronary artery disease)    a. 08/2014: inf STEMI s/p DES to RCA.   Marland Kitchen COPD (chronic obstructive pulmonary disease) ([place])   . Crohn's disease ([place])   . Dyspnea    DOE  . GERD (gastroesophageal reflux disease)   . Glaucoma   . Glucose intolerance (impaired glucose tolerance)    a. HgA1c 6.2 during 08/2014 admission   . Hypercholesterolemia   . Hypertension   . Hyperthyroidism    a. s/p ablation maintained on synthroid  . Hypothyroidism   . Multiple sclerosis ([place])   . Myocardial infarction ([place])    2016  . Neuromuscular disorder ([place])    MS  . Tobacco abuse    Past Surgical History:  Procedure Laterality Date  . AUGMENTATION MAMMAPLASTY Left   . [place]  . CATARACT EXTRACTION W/PHACO Left 07/19/2017   Procedure: CATARACT EXTRACTION PHACO AND INTRAOCULAR LENS PLACEMENT (IOC);  Surgeon: Birder Robson, MD;  Location: [facility] ORS;  Service: Ophthalmology;  Laterality: Left;  [place] 00:49.0 AP% 14.5 CDE 7.10 Fluid Pack Lot # [id number]  . COLONOSCOPY  2010   Dr Vira Agar  . CORONARY ANGIOPLASTY     STENT 2016  . LEFT HEART CATHETERIZATION WITH CORONARY ANGIOGRAM N/A 09/01/2014   Procedure: LEFT HEART CATHETERIZATION WITH CORONARY ANGIOGRAM;  Surgeon: Peter M Martinique, MD;  Location[facility] C[facility] LAB;  Service:  Cardiovascular;  Laterality: N/A;  . MASTECTOMY     RIGHT  . PORTACATH PLACEMENT  11/07/2012  . RIGHT OOPHORECTOMY  1979  . TONSILLECTOMY AND ADENOIDECTOMY  1957   Family History  Problem Relation Age of Onset  . Diabetes Mother   . Heart disease Mother        myocardial infarction  . Glaucoma Mother   . Graves' disease Mother   . Heart attack Mother 42  . Lung disease Father   . Heart disease Unknown        half sister  . Peripheral vascular disease Unknown        half sister  . Breast cancer Neg Hx    Social History   Socioeconomic History  . Marital status: Divorced    Spouse name: None  . Number of children: 1  . Years of education: None  . Highest education level: None  Social Needs  . Financial resource strain: None  . Food insecurity - worry: None  . Food insecurity - inability: None  . Transportation needs - medical: None  . Transportation needs - non-medical: None  Occupational History  . None  Tobacco Use  . Smoking status: Current Every Day Smoker    Packs/day: 0.00    Years: 40.00  Pack years: 0.00    Last attempt to quit: 07/01/2015    Years since quitting: 2.0  . Smokeless tobacco: Never Used  Substance and Sexual Activity  . Alcohol use: Yes    Alcohol/week: 0.0 oz    Comment: RARE  . Drug use: No  . Sexual activity: None  Other Topics Concern  . None  Social History Narrative  . None    Outpatient Encounter Medications as of 07/21/2017  Medication Sig  . albuterol (VENTOLIN HFA) 108 (90 Base) MCG/ACT inhaler INHALE TWO PUFFS BY MOUTH EVERY 6 HOURS AS NEEDED FOR WHEEZING FOR SHORTNESS OF BREATH  . aspirin 81 MG chewable tablet Chew 1 tablet (81 mg total) by mouth daily. (Patient taking differently: Chew 81 mg by mouth at bedtime. )  . clopidogrel (PLAVIX) 75 MG tablet Take 1 tablet (75 mg total) by mouth daily.  Marland Kitchen ezetimibe (ZETIA) 10 MG tablet Take 1 tablet (10 mg total) by mouth daily.  Marland Kitchen FLOVENT HFA 110 MCG/ACT inhaler INHALE 2 PUFFS BY  MOUTH TWICE DAILY  . latanoprost (XALATAN) 0.005 % ophthalmic solution Place 1 drop into both eyes at bedtime.   Marland Kitchen levothyroxine (SYNTHROID, LEVOTHROID) 112 MCG tablet TAKE ONE TABLET BY MOUTH ONCE DAILY  . metoprolol tartrate (LOPRESSOR) 25 MG tablet TAKE ONE-HALF TABLET BY MOUTH TWICE DAILY  . omeprazole (PRILOSEC OTC) 20 MG tablet Take 20 mg by mouth daily.  . rosuvastatin (CRESTOR) 10 MG tablet TAKE 1 TABLET BY MOUTH AT BEDTIME (Patient taking differently: TAKE 0.5 TABLET (5 MG) BY MOUTH ONCE DAILY BEDTIME.)   No facility-administered encounter medications on file as of 07/21/2017.     Review of Systems  Constitutional: Negative for appetite change and unexpected weight change.  HENT: Negative for congestion and sinus pressure.   Respiratory: Negative for cough, chest tightness and shortness of breath.   Cardiovascular: Negative for chest pain, palpitations and leg swelling.  Gastrointestinal: Negative for abdominal pain, diarrhea, nausea and vomiting.  Genitourinary: Negative for difficulty urinating and dysuria.  Musculoskeletal: Negative for joint swelling and myalgias.  Skin: Negative for color change and rash.  Neurological: Negative for dizziness, light-headedness and headaches.  Psychiatric/Behavioral: Negative for agitation and dysphoric mood.       Objective:     Blood pressure rechecked by me:  130/66  Physical Exam  Constitutional: She appears well-developed and well-nourished. No distress.  HENT:  Nose: Nose normal.  Mouth/Throat: Oropharynx is clear and moist.  Neck: Neck supple. No thyromegaly present.  Cardiovascular: Normal rate and regular rhythm.  Pulmonary/Chest: Breath sounds normal. No respiratory distress. She has no wheezes.  Abdominal: Soft. Bowel sounds are normal. There is no tenderness.  Musculoskeletal: She exhibits no edema or tenderness.  Lymphadenopathy:    She has no cervical adenopathy.  Skin: No rash noted. No erythema.  Psychiatric: She  has a normal mood and affect. Her behavior is normal.    BP 130/66   Pulse 78   Temp 98.3 F (36.8 C) (Oral)   Resp 18   Wt 221 lb (100.2 kg)   LMP 06/15/1977   SpO2 95%   BMI 35.67 kg/m  Wt Readings from Last 3 Encounters:  07/21/17 221 lb (100.2 kg)  07/19/17 200 lb (90.7 kg)  03/14/17 213 lb 4 oz (96.7 kg)     Lab Results  Component Value Date   WBC 7.3 03/14/2017   HGB 13.9 03/14/2017   HCT 42.4 03/14/2017   PLT 196.0 03/14/2017   GLUCOSE 140 (H)  03/14/2017   CHOL 187 03/14/2017   TRIG 238.0 (H) 03/14/2017   HDL 45.60 03/14/2017   LDLDIRECT 116.0 03/14/2017   LDLCALC 88 11/08/2016   ALT 24 03/14/2017   AST 23 03/14/2017   NA 140 03/14/2017   K 4.4 03/14/2017   CL 102 03/14/2017   CREATININE 0.80 03/14/2017   BUN 16 03/14/2017   CO2 30 03/14/2017   TSH 1.29 11/08/2016   HGBA1C 6.9 (H) 03/14/2017   MICROALBUR <0.7 11/08/2016       Assessment & Plan:   Problem List Items Addressed This Visit    Asthma    Breathing stable.        CAD (coronary artery disease)    Followed by cardiology.  Stable.  Continue risk factor modification.        Carotid artery stenosis    Previously evaluated by [facility].  Needs f/u.  Pt request f/u.  Refer back.       Relevant Orders   Ambulatory referral to Vascular Surgery   Crohn's disease [facility])    Just saw GI.  Postponed her colonoscopy and EGD.  Bowels stable.  Will notify when agreeable to reschedule.        Diabetes mellitus ([place])    Low carb diet and exercise.  Follow met b and a1c.        Relevant Orders   Hemoglobin B8G   Basic metabolic panel   GERD (gastroesophageal reflux disease)    Controlled on current regimen.  Follow.        History of breast cancer    Mammogram 01/13/17 - Birads II.       Hypercholesterolemia    On crestor.  Low cholesterol diet and exercise.  Follow lipid panel and liver function tests.        Relevant Orders   Hepatic function panel   Lipid panel   Hypertension    Blood  pressure under good control.  Continue same medication regimen.  Follow pressures.  Follow metabolic panel.        Hypothyroidism    On thyroid replacement.  Follow tsh.        Multiple sclerosis ([place])    Desires no further evaluation or treatment.  Follow.       Tobacco abuse    Discussed the need to quit smoking.  Declines to quit.  Follow.            Einar Pheasant, MD

## 2017-07-23 ENCOUNTER — Encounter: Payer: Self-pay | Admitting: Internal Medicine

## 2017-07-23 NOTE — Assessment & Plan Note (Signed)
Breathing stable.

## 2017-07-23 NOTE — Assessment & Plan Note (Signed)
Low carb diet and exercise.  Follow met b and a1c.   

## 2017-07-23 NOTE — Assessment & Plan Note (Signed)
On thyroid replacement.  Follow tsh.  

## 2017-07-23 NOTE — Assessment & Plan Note (Signed)
Discussed the need to quit smoking.  Declines to quit.  Follow.

## 2017-07-23 NOTE — Assessment & Plan Note (Signed)
Desires no further evaluation or treatment.  Follow.

## 2017-07-23 NOTE — Assessment & Plan Note (Signed)
Controlled on current regimen.  Follow.  

## 2017-07-23 NOTE — Assessment & Plan Note (Signed)
Mammogram 01/13/17 - Birads II.

## 2017-07-23 NOTE — Assessment & Plan Note (Signed)
On crestor.  Low cholesterol diet and exercise.  Follow lipid panel and liver function tests.

## 2017-07-23 NOTE — Assessment & Plan Note (Signed)
Followed by cardiology.  Stable.  Continue risk factor modification.

## 2017-07-23 NOTE — Assessment & Plan Note (Signed)
Blood pressure under good control.  Continue same medication regimen.  Follow pressures.  Follow metabolic panel.   

## 2017-07-23 NOTE — Assessment & Plan Note (Signed)
Just saw GI.  Postponed her colonoscopy and EGD.  Bowels stable.  Will notify when agreeable to reschedule.

## 2017-07-23 NOTE — Assessment & Plan Note (Signed)
Previously evaluated by [facility].  Needs f/u.  Pt request f/u.  Refer back.

## 2017-08-01 DIAGNOSIS — H2511 Age-related nuclear cataract, right eye: Secondary | ICD-10-CM | POA: Diagnosis not present

## 2017-08-02 ENCOUNTER — Encounter: Payer: Self-pay | Admitting: *Deleted

## 2017-08-09 ENCOUNTER — Encounter
Admission: RE | Disposition: A | Discharge status: Home / Self Care | Payer: Self-pay | Source: Ambulatory Visit | Attending: Ophthalmology

## 2017-08-09 ENCOUNTER — Ambulatory Visit: Payer: Medicare Other | Admitting: Certified Registered"

## 2017-08-09 ENCOUNTER — Ambulatory Visit
Admission: RE | Admit: 2017-08-09 | Discharge: 2017-08-09 | Disposition: A | Discharge status: Home / Self Care | Payer: Medicare Other | Source: Ambulatory Visit | Attending: Ophthalmology | Admitting: Ophthalmology

## 2017-08-09 DIAGNOSIS — Z79899 Other long term (current) drug therapy: Secondary | ICD-10-CM | POA: Insufficient documentation

## 2017-08-09 DIAGNOSIS — K219 Gastro-esophageal reflux disease without esophagitis: Secondary | ICD-10-CM | POA: Insufficient documentation

## 2017-08-09 DIAGNOSIS — I2510 Atherosclerotic heart disease of native coronary artery without angina pectoris: Secondary | ICD-10-CM | POA: Insufficient documentation

## 2017-08-09 DIAGNOSIS — E039 Hypothyroidism, unspecified: Secondary | ICD-10-CM | POA: Diagnosis not present

## 2017-08-09 DIAGNOSIS — Z7982 Long term (current) use of aspirin: Secondary | ICD-10-CM | POA: Diagnosis not present

## 2017-08-09 DIAGNOSIS — J449 Chronic obstructive pulmonary disease, unspecified: Secondary | ICD-10-CM | POA: Insufficient documentation

## 2017-08-09 DIAGNOSIS — H2511 Age-related nuclear cataract, right eye: Secondary | ICD-10-CM | POA: Diagnosis not present

## 2017-08-09 DIAGNOSIS — F17200 Nicotine dependence, unspecified, uncomplicated: Secondary | ICD-10-CM | POA: Insufficient documentation

## 2017-08-09 DIAGNOSIS — Z955 Presence of coronary angioplasty implant and graft: Secondary | ICD-10-CM | POA: Insufficient documentation

## 2017-08-09 DIAGNOSIS — E119 Type 2 diabetes mellitus without complications: Secondary | ICD-10-CM | POA: Diagnosis not present

## 2017-08-09 DIAGNOSIS — E7800 Pure hypercholesterolemia, unspecified: Secondary | ICD-10-CM | POA: Diagnosis not present

## 2017-08-09 DIAGNOSIS — I10 Essential (primary) hypertension: Secondary | ICD-10-CM | POA: Insufficient documentation

## 2017-08-09 DIAGNOSIS — G35 Multiple sclerosis: Secondary | ICD-10-CM | POA: Insufficient documentation

## 2017-08-09 DIAGNOSIS — I252 Old myocardial infarction: Secondary | ICD-10-CM | POA: Diagnosis not present

## 2017-08-09 HISTORY — PX: CATARACT EXTRACTION W/PHACO: SHX586

## 2017-08-09 SURGERY — PHACOEMULSIFICATION, CATARACT, WITH IOL INSERTION
Anesthesia: General | Site: Eye | Laterality: Right | Wound class: "Clean "

## 2017-08-09 MED ORDER — ARMC OPHTHALMIC DILATING DROPS
OPHTHALMIC | Status: AC
  Administered 2017-08-09: 1 via OPHTHALMIC
  Filled 2017-08-09: qty 0.4

## 2017-08-09 MED ORDER — FENTANYL CITRATE (PF) 100 MCG/2ML IJ SOLN
INTRAMUSCULAR | Status: DC | PRN
  Administered 2017-08-09 (×4): 50 ug via INTRAVENOUS

## 2017-08-09 MED ORDER — MOXIFLOXACIN HCL 0.5 % OP SOLN
OPHTHALMIC | Status: AC
  Filled 2017-08-09: qty 3

## 2017-08-09 MED ORDER — SODIUM CHLORIDE 0.9 % IV SOLN
INTRAVENOUS | Status: DC
  Administered 2017-08-09: 07:00:00 via INTRAVENOUS

## 2017-08-09 MED ORDER — MOXIFLOXACIN HCL 0.5 % OP SOLN: 1.0000 [drp] | OPHTHALMIC | Status: DC | PRN

## 2017-08-09 MED ORDER — PROPOFOL 10 MG/ML IV BOLUS
INTRAVENOUS | Status: AC
  Filled 2017-08-09: qty 20

## 2017-08-09 MED ORDER — POVIDONE-IODINE 5 % OP SOLN
OPHTHALMIC | Status: AC
  Filled 2017-08-09: qty 30

## 2017-08-09 MED ORDER — LIDOCAINE HCL (PF) 4 % IJ SOLN
INTRAMUSCULAR | Status: DC | PRN
  Administered 2017-08-09: 2 mL via OPHTHALMIC

## 2017-08-09 MED ORDER — MIDAZOLAM HCL 2 MG/2ML IJ SOLN
INTRAMUSCULAR | Status: AC
  Filled 2017-08-09: qty 2

## 2017-08-09 MED ORDER — ARMC OPHTHALMIC DILATING DROPS
1.0000 "application " | OPHTHALMIC | Status: AC
  Administered 2017-08-09 (×3): 1 via OPHTHALMIC

## 2017-08-09 MED ORDER — EPINEPHRINE PF 1 MG/ML IJ SOLN
INTRAOCULAR | Status: DC | PRN
  Administered 2017-08-09: 1 mL via OPHTHALMIC

## 2017-08-09 MED ORDER — FENTANYL CITRATE (PF) 100 MCG/2ML IJ SOLN: 25.0000 ug | INTRAMUSCULAR | Status: DC | PRN

## 2017-08-09 MED ORDER — NA CHONDROIT SULF-NA HYALURON 40-17 MG/ML IO SOLN
INTRAOCULAR | Status: AC
  Filled 2017-08-09: qty 1

## 2017-08-09 MED ORDER — FENTANYL CITRATE (PF) 100 MCG/2ML IJ SOLN
INTRAMUSCULAR | Status: AC
  Filled 2017-08-09: qty 2

## 2017-08-09 MED ORDER — MOXIFLOXACIN HCL 0.5 % OP SOLN
OPHTHALMIC | Status: DC | PRN
  Administered 2017-08-09: .2 mL via OPHTHALMIC

## 2017-08-09 MED ORDER — ONDANSETRON HCL 4 MG/2ML IJ SOLN
INTRAMUSCULAR | Status: AC
  Filled 2017-08-09: qty 2

## 2017-08-09 MED ORDER — TRYPAN BLUE 0.06 % OP SOLN
OPHTHALMIC | Status: AC
  Filled 2017-08-09: qty 0.5

## 2017-08-09 MED ORDER — PROPOFOL 10 MG/ML IV BOLUS
INTRAVENOUS | Status: DC | PRN
  Administered 2017-08-09: 20 mg via INTRAVENOUS
  Administered 2017-08-09: 10 mg via INTRAVENOUS

## 2017-08-09 MED ORDER — POVIDONE-IODINE 5 % OP SOLN
OPHTHALMIC | Status: DC | PRN
  Administered 2017-08-09: 1 via OPHTHALMIC

## 2017-08-09 MED ORDER — ONDANSETRON HCL 4 MG/2ML IJ SOLN
INTRAMUSCULAR | Status: DC | PRN
  Administered 2017-08-09: 4 mg via INTRAVENOUS

## 2017-08-09 MED ORDER — EPINEPHRINE PF 1 MG/ML IJ SOLN
INTRAMUSCULAR | Status: AC
  Filled 2017-08-09: qty 1

## 2017-08-09 MED ORDER — GLYCOPYRROLATE 0.2 MG/ML IJ SOLN
INTRAMUSCULAR | Status: DC | PRN
  Administered 2017-08-09: 0.1 mg via INTRAVENOUS

## 2017-08-09 MED ORDER — CARBACHOL 0.01 % IO SOLN
INTRAOCULAR | Status: DC | PRN
  Administered 2017-08-09: .5 mL via INTRAOCULAR

## 2017-08-09 MED ORDER — ONDANSETRON HCL 4 MG/2ML IJ SOLN: 4.0000 mg | Freq: Once | INTRAMUSCULAR | Status: DC | PRN

## 2017-08-09 MED ORDER — LIDOCAINE HCL (PF) 4 % IJ SOLN
INTRAMUSCULAR | Status: AC
  Filled 2017-08-09: qty 5

## 2017-08-09 MED ORDER — NA CHONDROIT SULF-NA HYALURON 40-17 MG/ML IO SOLN
INTRAOCULAR | Status: DC | PRN
  Administered 2017-08-09: 1 mL via INTRAOCULAR

## 2017-08-09 SURGICAL SUPPLY — 16 items
GLOVE BIO SURGEON STRL SZ8 (GLOVE) ×3 IMPLANT
GLOVE BIOGEL M 6.5 STRL (GLOVE) ×3 IMPLANT
GLOVE SURG LX 8.0 MICRO (GLOVE) ×2
GLOVE SURG LX STRL 8.0 MICRO (GLOVE) ×1 IMPLANT
GOWN STRL REUS W/ TWL LRG LVL3 (GOWN DISPOSABLE) ×2 IMPLANT
GOWN STRL REUS W/TWL LRG LVL3 (GOWN DISPOSABLE) ×4
LABEL CATARACT MEDS ST (LABEL) ×3 IMPLANT
LENS IOL TECNIS ITEC 20.0 (Intraocular Lens) ×2 IMPLANT
PACK CATARACT (MISCELLANEOUS) ×3 IMPLANT
PACK CATARACT BRASINGTON LX (MISCELLANEOUS) ×3 IMPLANT
PACK EYE AFTER SURG (MISCELLANEOUS) ×3 IMPLANT
SOL BSS BAG (MISCELLANEOUS) ×3
SOLUTION BSS BAG (MISCELLANEOUS) ×1 IMPLANT
SYR 5ML LL (SYRINGE) ×3 IMPLANT
WATER STERILE IRR 250ML POUR (IV SOLUTION) ×3 IMPLANT
WIPE NON LINTING 3.25X3.25 (MISCELLANEOUS) ×3 IMPLANT

## 2017-08-09 NOTE — H&P (Signed)
All labs reviewed. Abnormal studies sent to patients PCP when indicated.  Previous H&P reviewed, patient examined, there are NO CHANGES.  Sreeja Spies Porfilio3/27/20197:13 AM

## 2017-08-09 NOTE — Transfer of Care (Signed)
Immediate Anesthesia Transfer of Care Note  Patient: Haley Santos  Procedure(s) Performed: CATARACT EXTRACTION PHACO AND INTRAOCULAR LENS PLACEMENT (IOC) (Right Eye)  Patient Location: PACU and Short Stay  Anesthesia Type:General  Level of Consciousness: awake, alert , oriented and patient cooperative  Airway & Oxygen Therapy: Patient Spontanous Breathing  Post-op Assessment: Report given to RN, Post -op Vital signs reviewed and stable and Patient moving all extremities X 4  Post vital signs: Reviewed and stable  Last Vitals:  Vitals Value Taken Time  BP    Temp 36.7 C 08/09/2017  7:53 AM  Pulse    Resp 16 08/09/2017  7:53 AM  SpO2      Last Pain:  Vitals:   08/09/17 0609  TempSrc: Oral  PainSc: 0-No pain         Complications: No apparent anesthesia complications

## 2017-08-09 NOTE — Op Note (Signed)
PREOPERATIVE DIAGNOSIS:  Nuclear sclerotic cataract of the right eye.   POSTOPERATIVE DIAGNOSIS:  nuclear slerotic cataract right eye   OPERATIVE PROCEDURE: Procedure(s): CATARACT EXTRACTION PHACO AND INTRAOCULAR LENS PLACEMENT (IOC)   SURGEON:  Birder Robson, MD.   ANESTHESIA:  Anesthesiologist: Alphonsus Sias, MD CRNA: Nile Riggs, CRNA  1.      Managed anesthesia care. 2.      0.67m of Shugarcaine was instilled in the eye following the paracentesis.   COMPLICATIONS:  None.   TECHNIQUE:   Stop and chop   DESCRIPTION OF PROCEDURE:  The patient was examined and consented in the preoperative holding area where the aforementioned topical anesthesia was applied to the right eye and then brought back to the Operating Room where the right eye was prepped and draped in the usual sterile ophthalmic fashion and a lid speculum was placed. A paracentesis was created with the side port blade and the anterior chamber was filled with viscoelastic. A near clear corneal incision was performed with the steel keratome. A continuous curvilinear capsulorrhexis was performed with a cystotome followed by the capsulorrhexis forceps. Hydrodissection and hydrodelineation were carried out with BSS on a blunt cannula. The lens was removed in a stop and chop  technique and the remaining cortical material was removed with the irrigation-aspiration handpiece. The capsular bag was inflated with viscoelastic and the Technis ZCB00  lens was placed in the capsular bag without complication. The remaining viscoelastic was removed from the eye with the irrigation-aspiration handpiece. The wounds were hydrated. The anterior chamber was flushed with Miostat and the eye was inflated to physiologic pressure. 0.159mof Vigamox was placed in the anterior chamber. The wounds were found to be water tight. The eye was dressed with Vigamox. The patient was given protective glasses to wear throughout the day and a shield with  which to sleep tonight. The patient was also given drops with which to begin a drop regimen today and will follow-up with me in one day. Implant Name Type Inv. Item Serial No. Manufacturer Lot No. LRB No. Used  LENS IOL DIOP 20.0 - [id number] Intraocular Lens LENS IOL DIOP 20.0 [id number]  Right 1   Procedure(s) with comments: CATARACT EXTRACTION PHACO AND INTRAOCULAR LENS PLACEMENT (IOC) (Right) - [place]0:42 AP% 13.8 CDE 5.88 Fluid pack lot #[id number]  Electronically signed: WiBirder Robson/27/2019 7:49 AM

## 2017-08-09 NOTE — Anesthesia Post-op Follow-up Note (Signed)
Anesthesia QCDR form completed.        

## 2017-08-09 NOTE — Discharge Instructions (Signed)
Eye Surgery Discharge Instructions  Expect mild scratchy sensation or mild soreness. DO NOT RUB YOUR EYE!  The day of surgery:  Minimal physical activity, but bed rest is not required  No reading, computer work, or close hand work  No bending, lifting, or straining.  May watch TV  For 24 hours:  No driving, legal decisions, or alcoholic beverages  Safety precautions  Eat anything you prefer: It is better to start with liquids, then soup then solid foods.  _____ Eye patch should be worn until postoperative exam tomorrow.  ____ Solar shield eyeglasses should be worn for comfort in the sunlight/patch while sleeping  Resume all regular medications including aspirin or Coumadin if these were discontinued prior to surgery. You may shower, bathe, shave, or wash your hair. Tylenol may be taken for mild discomfort.  Call your doctor if you experience significant pain, nausea, or vomiting, fever > 101 or other signs of infection. [phone] or [phone] Specific instructions:  Follow-up Information    Birder Robson, MD Follow up.   Specialty:  Ophthalmology Why:  March 28 at10:45am Contact information: [place] [address] [phone]

## 2017-08-09 NOTE — Anesthesia Preprocedure Evaluation (Signed)
Anesthesia Evaluation  Patient identified by MRN, date of birth, ID band Patient awake    Reviewed: Allergy & Precautions, H&P , NPO status , reviewed documented beta blocker date and time   Airway Mallampati: III       Dental  (+) Poor Dentition, Missing, Partial Upper   Pulmonary shortness of breath, asthma , COPD, Current Smoker,    Pulmonary exam normal        Cardiovascular hypertension, + CAD, + Past MI and + Peripheral Vascular Disease  Normal cardiovascular exam     Neuro/Psych  Neuromuscular disease    GI/Hepatic GERD  Controlled,  Endo/Other  diabetesHypothyroidism Hyperthyroidism   Renal/GU      Musculoskeletal   Abdominal   Peds  Hematology  (+) anemia ,   Anesthesia Other Findings Pt doesn't want Versed - used only fentanyl for last cataract  Reproductive/Obstetrics                             Anesthesia Physical Anesthesia Plan  ASA: III  Anesthesia Plan: General   Post-op Pain Management:    Induction:   PONV Risk Score and Plan: Ondansetron and TIVA  Airway Management Planned:   Additional Equipment:   Intra-op Plan:   Post-operative Plan:   Informed Consent: I have reviewed the patients History and Physical, chart, labs and discussed the procedure including the risks, benefits and alternatives for the proposed anesthesia with the patient or authorized representative who has indicated his/her understanding and acceptance.   Dental Advisory Given  Plan Discussed with: CRNA  Anesthesia Plan Comments:         Anesthesia Quick Evaluation

## 2017-08-09 NOTE — Anesthesia Postprocedure Evaluation (Signed)
Anesthesia Post Note  Patient: [name]  Procedure(s) Performed: CATARACT EXTRACTION PHACO AND INTRAOCULAR LENS PLACEMENT (IOC) (Right Eye)  Patient location during evaluation: Endoscopy Anesthesia Type: General Level of consciousness: awake and alert Pain management: pain level controlled Vital Signs Assessment: post-procedure vital signs reviewed and stable Respiratory status: spontaneous breathing, nonlabored ventilation and respiratory function stable Cardiovascular status: blood pressure returned to baseline and stable Postop Assessment: no apparent nausea or vomiting Anesthetic complications: no     Last Vitals:  Vitals:   08/09/17 0753 08/09/17 0757  BP: (!) 132/53 127/65  Pulse: 67   Resp: 16   Temp: 36.7 C   SpO2: 98%     Last Pain:  Vitals:   08/09/17 0609  TempSrc: Oral  PainSc: 0-No pain                 Alphonsus Sias

## 2017-08-17 ENCOUNTER — Other Ambulatory Visit (INDEPENDENT_AMBULATORY_CARE_PROVIDER_SITE_OTHER): Payer: Medicare Other

## 2017-08-17 DIAGNOSIS — E119 Type 2 diabetes mellitus without complications: Secondary | ICD-10-CM

## 2017-08-17 DIAGNOSIS — E7800 Pure hypercholesterolemia, unspecified: Secondary | ICD-10-CM | POA: Diagnosis not present

## 2017-08-17 LAB — BASIC METABOLIC PANEL
BUN: 13 mg/dL (ref 6–23)
CALCIUM: 9.5 mg/dL (ref 8.4–10.5)
CO2: 30 mEq/L (ref 19–32)
Chloride: 101 mEq/L (ref 96–112)
Creatinine, Ser: 0.9 mg/dL (ref 0.40–1.20)
GFR: 65.85 mL/min (ref >60.00)
GLUCOSE: 142 mg/dL — AB (ref 70–99)
Potassium: 4.5 mEq/L (ref 3.5–5.1)
SODIUM: 139 meq/L (ref 135–145)

## 2017-08-17 LAB — LIPID PANEL
CHOLESTEROL: 143 mg/dL (ref 0–200)
HDL: 42.9 mg/dL (ref >39.00)
LDL Cholesterol: 67 mg/dL (ref 0–99)
NONHDL: 100.11
Total CHOL/HDL Ratio: 3
Triglycerides: 168 mg/dL — ABNORMAL HIGH (ref 0.0–149.0)
VLDL: 33.6 mg/dL (ref 0.0–40.0)

## 2017-08-17 LAB — HEPATIC FUNCTION PANEL
ALBUMIN: 3.8 g/dL (ref 3.5–5.2)
ALK PHOS: 81 U/L (ref 39–117)
ALT: 22 U/L (ref 0–35)
AST: 28 U/L (ref 0–37)
Bilirubin, Direct: 0.1 mg/dL (ref 0.0–0.3)
Total Bilirubin: 0.6 mg/dL (ref 0.2–1.2)
Total Protein: 6.8 g/dL (ref 6.0–8.3)

## 2017-08-17 LAB — HEMOGLOBIN A1C: Hgb A1c MFr Bld: 7.5 % — ABNORMAL HIGH (ref 4.6–6.5)

## 2017-08-21 ENCOUNTER — Encounter: Payer: Self-pay | Admitting: *Deleted

## 2017-08-24 ENCOUNTER — Other Ambulatory Visit: Payer: Self-pay | Admitting: Cardiovascular Disease

## 2017-09-11 ENCOUNTER — Encounter (INDEPENDENT_AMBULATORY_CARE_PROVIDER_SITE_OTHER): Payer: Self-pay | Admitting: Vascular Surgery

## 2017-09-11 ENCOUNTER — Ambulatory Visit (INDEPENDENT_AMBULATORY_CARE_PROVIDER_SITE_OTHER): Payer: Medicare Other | Admitting: Vascular Surgery

## 2017-09-11 VITALS — BP 128/64 | HR 70 | Resp 16 | Ht 66.0 in | Wt 219.0 lb

## 2017-09-11 DIAGNOSIS — I872 Venous insufficiency (chronic) (peripheral): Secondary | ICD-10-CM | POA: Diagnosis not present

## 2017-09-11 DIAGNOSIS — E7800 Pure hypercholesterolemia, unspecified: Secondary | ICD-10-CM | POA: Diagnosis not present

## 2017-09-11 DIAGNOSIS — I2119 ST elevation (STEMI) myocardial infarction involving other coronary artery of inferior wall: Secondary | ICD-10-CM

## 2017-09-11 DIAGNOSIS — I10 Essential (primary) hypertension: Secondary | ICD-10-CM

## 2017-09-11 DIAGNOSIS — I6523 Occlusion and stenosis of bilateral carotid arteries: Secondary | ICD-10-CM

## 2017-09-11 DIAGNOSIS — I890 Lymphedema, not elsewhere classified: Secondary | ICD-10-CM

## 2017-09-11 NOTE — Progress Notes (Signed)
MRN : [id number]  Haley Santos is a 72 y.o. (1945-06-13) female who presents with chief complaint of  Chief Complaint  Patient presents with  . Follow-up    ref Nicki Reaper for carotid stenosis  .  History of Present Illness:   The patient is seen for evaluation of carotid stenosis. The carotid stenosis was identified years ago by ultrasound.  The patient denies amaurosis fugax. There is no recent history of TIA symptoms or focal motor deficits. There is no prior documented CVA.  There is no history of migraine headaches. There is no history of seizures.  The patient is taking enteric-coated aspirin 81 mg daily.  Patient is also noting severe leg swelling. The patient first noticed the swelling after her second cataract operation but is now concerned because of a significant increase in the overall edema. The swelling is associated with pain, "tightness" and discoloration. The patient notes that in the morning the legs are significantly improved but they steadily worsened throughout the course of the day. Elevation makes the legs better, dependency makes them much worse.   There is no history of ulcerations associated with the swelling.   The patient denies any recent changes in their medications.  The patient has not been wearing graduated compression.  The patient has no had any past angiography, interventions or vascular surgery.  The patient denies a history of DVT or PE. There is no prior history of phlebitis. There is no past documented history of primary lymphedema.  There is no history of radiation treatment to the groin or pelvis No history of malignancies. No history of trauma or groin or pelvic surgery. No history of foreign travel or parasitic infections area   The patient has a history of coronary artery disease, no recent episodes of angina or shortness of breath. The patient denies PAD or claudication symptoms. There is a history of hyperlipidemia which is being  treated with a statin.    Current Meds  Medication Sig  . albuterol (VENTOLIN HFA) 108 (90 Base) MCG/ACT inhaler INHALE TWO PUFFS BY MOUTH EVERY 6 HOURS AS NEEDED FOR WHEEZING FOR SHORTNESS OF BREATH  . aspirin 81 MG chewable tablet Chew 1 tablet (81 mg total) by mouth daily. (Patient taking differently: Chew 81 mg by mouth at bedtime. )  . clopidogrel (PLAVIX) 75 MG tablet Take 1 tablet (75 mg total) by mouth daily.  Marland Kitchen ezetimibe (ZETIA) 10 MG tablet TAKE 1 TABLET BY MOUTH ONCE DAILY  . FLOVENT HFA 110 MCG/ACT inhaler INHALE 2 PUFFS BY MOUTH TWICE DAILY  . latanoprost (XALATAN) 0.005 % ophthalmic solution Place 1 drop into both eyes at bedtime.   Marland Kitchen levothyroxine (SYNTHROID, LEVOTHROID) 112 MCG tablet TAKE ONE TABLET BY MOUTH ONCE DAILY  . metoprolol tartrate (LOPRESSOR) 25 MG tablet TAKE ONE-HALF TABLET BY MOUTH TWICE DAILY  . omeprazole (PRILOSEC OTC) 20 MG tablet Take 20 mg by mouth daily.  . rosuvastatin (CRESTOR) 10 MG tablet TAKE 1 TABLET BY MOUTH AT BEDTIME (Patient taking differently: ALTERNTES 5/10MG AT BEDTIME)    Past Medical History:  Diagnosis Date  . Asthma with bronchitis   . Breast cancer ([place]) 2014  . CAD (coronary artery disease)    a. 08/2014: inf STEMI s/p DES to RCA.   Marland Kitchen COPD (chronic obstructive pulmonary disease) ([place])   . Crohn's disease ([place])   . Dyspnea    DOE  . GERD (gastroesophageal reflux disease)   . Glaucoma   . Glucose intolerance (impaired glucose tolerance)  a. HgA1c 6.2 during 08/2014 admission   . Hypercholesterolemia   . Hypertension   . Hyperthyroidism    a. s/p ablation maintained on synthroid  . Hypothyroidism   . Multiple sclerosis ([place])   . Myocardial infarction ([place])    2016  . Neuromuscular disorder ([place])    MS  . Tobacco abuse     Past Surgical History:  Procedure Laterality Date  . AUGMENTATION MAMMAPLASTY Left   . [place]  . CATARACT EXTRACTION W/PHACO Left 07/19/2017    Procedure: CATARACT EXTRACTION PHACO AND INTRAOCULAR LENS PLACEMENT (IOC);  Surgeon: Birder Robson, MD;  Location: [facility] ORS;  Service: Ophthalmology;  Laterality: Left;  [place] 00:49.0 AP% 14.5 CDE 7.10 Fluid Pack Lot # [id number]  . CATARACT EXTRACTION W/PHACO Right 08/09/2017   Procedure: CATARACT EXTRACTION PHACO AND INTRAOCULAR LENS PLACEMENT ([place]);  Surgeon: Birder Robson, MD;  Location: [facility] ORS;  Service: Ophthalmology;  Laterality: Right;  [place] 00:42 AP% 13.8 CDE 5.88 Fluid pack lot #[id number]  . COLONOSCOPY  2010   Dr Vira Agar  . CORONARY ANGIOPLASTY     STENT 2016  . LEFT HEART CATHETERIZATION WITH CORONARY ANGIOGRAM N/A 09/01/2014   Procedure: LEFT HEART CATHETERIZATION WITH CORONARY ANGIOGRAM;  Surgeon: Peter M Martinique, MD;  Location: [facility] CATH LAB;  Service: Cardiovascular;  Laterality: N/A;  . MASTECTOMY     RIGHT  . PORTACATH PLACEMENT  11/07/2012  . RIGHT OOPHORECTOMY  1979  . TONSILLECTOMY AND ADENOIDECTOMY  37    Social History Social History   Tobacco Use  . Smoking status: Current Every Day Smoker    Packs/day: 0.00    Years: 40.00    Pack years: 0.00    Last attempt to quit: 07/01/2015    Years since quitting: 2.2  . Smokeless tobacco: Never Used  Substance Use Topics  . Alcohol use: Yes    Alcohol/week: 0.0 oz    Comment: RARE  . Drug use: No    Family History Family History  Problem Relation Age of Onset  . Diabetes Mother   . Heart disease Mother        myocardial infarction  . Glaucoma Mother   . Graves' disease Mother   . Heart attack Mother 33  . Lung disease Father   . Heart disease Unknown        half sister  . Peripheral vascular disease Unknown        half sister  . Breast cancer Neg Hx   No family history of bleeding/clotting disorders, porphyria or autoimmune disease   Allergies  Allergen Reactions  . Statins Other (See Comments)    Muscle weakness/pain. Trials of multiple agents. Muscle weakness/pain. Trials of multiple  agents. Muscle weakness/pain. Trials of multiple agents.  . Clindamycin/Lincomycin Other (See Comments)    Digestive upset  . Codeine Other (See Comments)    "hallucinations"  . Shellfish Allergy Nausea And Vomiting    Felt goofy headed  . Valium [Diazepam] Anxiety and Other (See Comments)    hyperactivity     REVIEW OF SYSTEMS (Negative unless checked)  Constitutional: [] Weight loss  [] Fever  [] Chills Cardiac: [] Chest pain   [] Chest pressure   [] Palpitations   [] Shortness of breath when laying flat   [] Shortness of breath with exertion. Vascular:  [] Pain in legs with walking   [x] Pain in legs at rest  [] History of DVT   [] Phlebitis   [x] Swelling in legs   [] Varicose veins   [] Non-healing ulcers  Pulmonary:   [] Uses home oxygen   [] Productive cough   [] Hemoptysis   [] Wheeze  [] COPD   [] Asthma Neurologic:  [] Dizziness   [] Seizures   [] History of stroke   [] History of TIA  [] Aphasia   [] Vissual changes   [] Weakness or numbness in arm   [] Weakness or numbness in leg Musculoskeletal:   [] Joint swelling   [x] Joint pain   [] Low back pain Hematologic:  [] Easy bruising  [] Easy bleeding   [] Hypercoagulable state   [] Anemic Gastrointestinal:  [] Diarrhea   [] Vomiting  [] Gastroesophageal reflux/heartburn   [] Difficulty swallowing. Genitourinary:  [] Chronic kidney disease   [] Difficult urination  [] Frequent urination   [] Blood in urine Skin:  [] Rashes   [] Ulcers  Psychological:  [] History of anxiety   []  History of major depression.  Physical Examination  Vitals:   09/11/17 1020  BP: 128/64  Pulse: 70  Resp: 16  Weight: 219 lb (99.3 kg)  Height: 5' 6"  (1.676 m)   Body mass index is 35.35 kg/m. Gen: WD/WN, NAD Head: [place]/AT, No temporalis wasting.  Ear/Nose/Throat: Hearing grossly intact, nares w/o erythema or drainage, poor dentition Eyes: PER, EOMI, sclera nonicteric.  Neck: Supple, no masses.  No bruit or JVD.  Pulmonary:  Good air movement, clear to auscultation bilaterally, no use of  accessory muscles.  Cardiac: RRR, normal S1, S2, no Murmurs. Vascular: bilateral carotid bruit Vessel Right Left  Radial Palpable Palpable  Brachial Palpable Palpable  Carotid Palpable Palpable  PT Not Palpable Not Palpable  DP 1+ Palpable 1+ Palpable  Gastrointestinal: soft, non-distended. No guarding/no peritoneal signs.  Musculoskeletal: M/S 5/5 throughout.  No deformity or atrophy.  Neurologic: CN 2-12 intact. Pain and light touch intact in extremities.  Symmetrical.  Speech is fluent. Motor exam as listed above. Psychiatric: Judgment intact, Mood & affect appropriate for pt's clinical situation. Dermatologic: venous rashes no ulcers noted.  No changes consistent with cellulitis. Lymph : No Cervical lymphadenopathy, no lichenification or skin changes of chronic lymphedema.  CBC Lab Results  Component Value Date   WBC 7.3 03/14/2017   HGB 13.9 03/14/2017   HCT 42.4 03/14/2017   MCV 85.5 03/14/2017   PLT 196.0 03/14/2017    BMET    Component Value Date/Time   NA 139 08/17/2017 0806   NA 134 (A) 07/08/2014   NA 139 05/01/2014 1008   K 4.5 08/17/2017 0806   K 4.0 05/01/2014 1008   CL 101 08/17/2017 0806   CL 102 05/01/2014 1008   CO2 30 08/17/2017 0806   CO2 30 05/01/2014 1008   GLUCOSE 142 (H) 08/17/2017 0806   GLUCOSE 107 (H) 05/01/2014 1008   BUN 13 08/17/2017 0806   BUN 13 07/08/2014   BUN 13 05/01/2014 1008   CREATININE 0.90 08/17/2017 0806   CREATININE 0.91 05/01/2014 1008   CALCIUM 9.5 08/17/2017 0806   CALCIUM 9.1 05/01/2014 1008   GFRNONAA 56 (L) 07/04/2015 1445   GFRNONAA >60 05/01/2014 1008   GFRNONAA 57 (L) 01/29/2014 1021   GFRAA >60 07/04/2015 1445   GFRAA >60 05/01/2014 1008   GFRAA >60 01/29/2014 1021   CrCl cannot be calculated (Patient's most recent lab result is older than the maximum 21 days allowed.).  COAG No results found for: INR, PROTIME  Radiology No results found.   Assessment/Plan 1. Bilateral carotid artery  stenosis Recommend:  Given the patient's asymptomatic stenosis no  invasive testing or surgery at this time.  Duplex ultrasound will be ordered as she is over due for follow up.  Continue antiplatelet therapy as prescribed Continue management of CAD, HTN and Hyperlipidemia Healthy heart diet,  encouraged exercise at least 4 times per week   Follow up in 1 month with duplex ultrasound   - VAS US CAROTID; Future  2. Lymphedema I have had a long discussion with the patient regarding swelling and why it  causes symptoms.  Patient will begin wearing graduated compression stockings class 1 (20-30 mmHg) on a daily basis a prescription was given. The patient will  beginning wearing the stockings first thing in the morning and removing them in the evening. The patient is instructed specifically not to sleep in the stockings.   In addition, behavioral modification will be initiated.  This will include frequent elevation, use of over the counter pain medications and exercise such as walking.  I have reviewed systemic causes for chronic edema such as liver, kidney and cardiac etiologies.  The patient denies problems with these organ systems.    Consideration for a lymph pump will also be made based upon the effectiveness of conservative therapy.  This would help to improve the edema control and prevent sequela such as ulcers and infections   Patient should undergo duplex ultrasound of the venous system to ensure that DVT or reflux is not present.  The patient will follow-up with me after the ultrasound.    3. Chronic venous insufficiency I have had a long discussion with the patient regarding swelling and why it  causes symptoms.  Patient will begin wearing graduated compression stockings class 1 (20-30 mmHg) on a daily basis a prescription was given. The patient will  beginning wearing the stockings first thing in the morning and removing them in the evening. The patient is instructed  specifically not to sleep in the stockings.   In addition, behavioral modification will be initiated.  This will include frequent elevation, use of over the counter pain medications and exercise such as walking.  I have reviewed systemic causes for chronic edema such as liver, kidney and cardiac etiologies.  The patient denies problems with these organ systems.    Consideration for a lymph pump will also be made based upon the effectiveness of conservative therapy.  This would help to improve the edema control and prevent sequela such as ulcers and infections   Patient should undergo duplex ultrasound of the venous system to ensure that DVT or reflux is not present.  The patient will follow-up with me after the ultrasound.    4. ST elevation myocardial infarction (STEMI) of inferior wall (HCC) Continue cardiac and antihypertensive medications as already ordered and reviewed, no changes at this time.  Continue statin as ordered and reviewed, no changes at this time  Nitrates PRN for chest pain   5. Essential hypertension Continue antihypertensive medications as already ordered, these medications have been reviewed and there are no changes at this time.   6. Hypercholesterolemia Continue statin as ordered and reviewed, no changes at this time    Hortencia Pilar, MD  09/11/2017 11:10 AM

## 2017-10-05 ENCOUNTER — Ambulatory Visit (INDEPENDENT_AMBULATORY_CARE_PROVIDER_SITE_OTHER): Payer: Medicare Other

## 2017-10-05 ENCOUNTER — Ambulatory Visit (INDEPENDENT_AMBULATORY_CARE_PROVIDER_SITE_OTHER): Payer: Medicare Other | Admitting: Vascular Surgery

## 2017-10-05 ENCOUNTER — Encounter (INDEPENDENT_AMBULATORY_CARE_PROVIDER_SITE_OTHER): Payer: Self-pay | Admitting: Vascular Surgery

## 2017-10-05 VITALS — BP 129/67 | HR 78 | Resp 17 | Ht 66.0 in | Wt 218.0 lb

## 2017-10-05 DIAGNOSIS — E119 Type 2 diabetes mellitus without complications: Secondary | ICD-10-CM | POA: Diagnosis not present

## 2017-10-05 DIAGNOSIS — I10 Essential (primary) hypertension: Secondary | ICD-10-CM

## 2017-10-05 DIAGNOSIS — I6523 Occlusion and stenosis of bilateral carotid arteries: Secondary | ICD-10-CM | POA: Diagnosis not present

## 2017-10-05 DIAGNOSIS — E7800 Pure hypercholesterolemia, unspecified: Secondary | ICD-10-CM

## 2017-10-05 DIAGNOSIS — Z720 Tobacco use: Secondary | ICD-10-CM

## 2017-10-05 NOTE — Progress Notes (Signed)
Subjective:    Patient ID: Haley Santos, female    DOB: Sep 08, 1945, 72 y.o.   MRN: [id number] Chief Complaint  Patient presents with  . Follow-up    pt conv carotid   Patient presents to review vascular studies.  Patient was last seen on September 11, 2016 and evaluation of carotid artery disease.  The patient presents today without complaint. The patient denies experiencing Amaurosis Fugax, TIA like symptoms or focal motor deficits.  The patient underwent a bilateral carotid duplex which was notable for right internal carotid artery stenosis of 40 to 59%.  Left internal carotid artery stenosis of 40 to 59%.  Bilateral vertebral arteries demonstrate antegrade flow.  Left subclavian artery was stenotic.  The patient denies any left upper extremity pain.  Normal flow hemodynamics were seen in the right subclavian artery.  Patient denies any fever, nausea vomiting.  Review of Systems  Constitutional: Negative.   HENT: Negative.   Eyes: Negative.   Respiratory: Negative.   Cardiovascular:       Carotid artery stenosis  Gastrointestinal: Negative.   Endocrine: Negative.   Genitourinary: Negative.   Musculoskeletal: Negative.   Skin: Negative.   Allergic/Immunologic: Negative.   Neurological: Negative.   Hematological: Negative.   Psychiatric/Behavioral: Negative.       Objective:   Physical Exam  Constitutional: She is oriented to person, place, and time. She appears well-developed and well-nourished. No distress.  HENT:  Head: Normocephalic and atraumatic.  Right Ear: External ear normal.  Left Ear: External ear normal.  Eyes: Pupils are equal, round, and reactive to light. Conjunctivae and EOM are normal.  Neck: Normal range of motion.  No carotid bruits noted on exam  Cardiovascular: Normal rate, regular rhythm, normal heart sounds and intact distal pulses.  Pulses:      Radial pulses are 2+ on the right side, and 2+ on the left side.  Pulmonary/Chest: Effort normal and breath  sounds normal.  Musculoskeletal: Normal range of motion. She exhibits no edema.  Neurological: She is alert and oriented to person, place, and time.  Skin: Skin is warm and dry. She is not diaphoretic.  Psychiatric: She has a normal mood and affect. Her behavior is normal. Judgment and thought content normal.  Vitals reviewed.  BP 129/67 (BP Location: Left Arm)   Pulse 78   Resp 17   Ht 5\' 6"  (1.676 m)   Wt 218 lb (98.9 kg)   LMP 06/15/1977   BMI 35.19 kg/m   Past Medical History:  Diagnosis Date  . Asthma with bronchitis   . Breast cancer ([place]) 2014  . CAD (coronary artery disease)    a. 08/2014: inf STEMI s/p DES to RCA.   Marland Kitchen COPD (chronic obstructive pulmonary disease) ([place])   . Crohn's disease ([place])   . Dyspnea    DOE  . GERD (gastroesophageal reflux disease)   . Glaucoma   . Glucose intolerance (impaired glucose tolerance)    a. HgA1c 6.2 during 08/2014 admission   . Hypercholesterolemia   . Hypertension   . Hyperthyroidism    a. s/p ablation maintained on synthroid  . Hypothyroidism   . Multiple sclerosis ([place])   . Myocardial infarction ([place])    2016  . Neuromuscular disorder ([place])    MS  . Tobacco abuse    Social History   Socioeconomic History  . Marital status: Divorced    Spouse name: Not on file  . Number of children: 1  . Years of education:  Not on file  . Highest education level: Not on file  Occupational History  . Not on file  Social Needs  . Financial resource strain: Not on file  . Food insecurity:    Worry: Not on file    Inability: Not on file  . Transportation needs:    Medical: Not on file    Non-medical: Not on file  Tobacco Use  . Smoking status: Current Every Day Smoker    Packs/day: 0.00    Years: 40.00    Pack years: 0.00    Last attempt to quit: 07/01/2015    Years since quitting: 2.2  . Smokeless tobacco: Never Used  Substance and Sexual Activity  . Alcohol use: Yes    Alcohol/week: 0.0 oz    Comment: RARE  . Drug use: No   . Sexual activity: Not on file  Lifestyle  . Physical activity:    Days per week: Not on file    Minutes per session: Not on file  . Stress: Not on file  Relationships  . Social connections:    Talks on phone: Not on file    Gets together: Not on file    Attends religious service: Not on file    Active member of club or organization: Not on file    Attends meetings of clubs or organizations: Not on file    Relationship status: Not on file  . Intimate partner violence:    Fear of current or ex partner: Not on file    Emotionally abused: Not on file    Physically abused: Not on file    Forced sexual activity: Not on file  Other Topics Concern  . Not on file  Social History Narrative  . Not on file   Past Surgical History:  Procedure Laterality Date  . AUGMENTATION MAMMAPLASTY Left   . [place]  . CATARACT EXTRACTION W/PHACO Left 07/19/2017   Procedure: CATARACT EXTRACTION PHACO AND INTRAOCULAR LENS PLACEMENT (IOC);  Surgeon: Birder Robson, MD;  Location: [facility] ORS;  Service: Ophthalmology;  Laterality: Left;  [place] 00:49.0 AP% 14.5 CDE 7.10 Fluid Pack Lot # [id number]  . CATARACT EXTRACTION W/PHACO Right 08/09/2017   Procedure: CATARACT EXTRACTION PHACO AND INTRAOCULAR LENS PLACEMENT ([place]);  Surgeon: Birder Robson, MD;  Location: [facility] ORS;  Service: Ophthalmology;  Laterality: Right;  [place] 00:42 AP% 13.8 CDE 5.88 Fluid pack lot #[id number]  . COLONOSCOPY  2010   Dr Vira Agar  . CORONARY ANGIOPLASTY     STENT 2016  . LEFT HEART CATHETERIZATION WITH CORONARY ANGIOGRAM N/A 09/01/2014   Procedure: LEFT HEART CATHETERIZATION WITH CORONARY ANGIOGRAM;  Surgeon: Peter M Martinique, MD;  Location[facility] CATH LAB;  Service: Cardiovascular;  Laterality: N/A;  . MASTECTOMY     RIGHT  . PORTACATH PLACEMENT  11/07/2012  . RIGHT OOPHORECTOMY  1979  . TONSILLECTOMY AND ADENOIDECTOMY  1957   Family History  Problem Relation Age of Onset  . Diabetes  Mother   . Heart disease Mother        myocardial infarction  . Glaucoma Mother   . Graves' disease Mother   . Heart attack Mother 54  . Lung disease Father   . Heart disease Unknown        half sister  . Peripheral vascular disease Unknown        half sister  . Breast cancer Neg Hx    Allergies  Allergen Reactions  . Statins Other (See Comments)  Muscle weakness/pain. Trials of multiple agents. Muscle weakness/pain. Trials of multiple agents. Muscle weakness/pain. Trials of multiple agents.  . Clindamycin/Lincomycin Other (See Comments)    Digestive upset  . Codeine Other (See Comments)    "hallucinations"  . Shellfish Allergy Nausea And Vomiting    Felt goofy headed  . Valium [Diazepam] Anxiety and Other (See Comments)    hyperactivity      Assessment & Plan:  Patient presents to review vascular studies.  Patient was last seen on September 11, 2016 and evaluation of carotid artery disease.  The patient presents today without complaint. The patient denies experiencing Amaurosis Fugax, TIA like symptoms or focal motor deficits.  The patient underwent a bilateral carotid duplex which was notable for right internal carotid artery stenosis of 40 to 59%.  Left internal carotid artery stenosis of 40 to 59%.  Bilateral vertebral arteries demonstrate antegrade flow.  Left subclavian artery was stenotic.  The patient denies any left upper extremity pain.  Normal flow hemodynamics were seen in the right subclavian artery.  Patient denies any fever, nausea vomiting.  1. Bilateral carotid artery stenosis - Stable Studies reviewed with patient. Patient asymptomatic with stable duplex. No intervention at this time. Patient to return in one year for surveillance carotid duplex. I have discussed with the patient at length the risk factors for and pathogenesis of atherosclerotic disease and encouraged a healthy diet, regular exercise regimen and blood pressure / glucose control.  Patient was  instructed to contact our office in the interim with problems such as arm / leg weakness or numbness, speech / swallowing difficulty or temporary monocular blindness. The patient expresses their understanding.  - VAS US CAROTID; Future  2. Hypercholesterolemia - Stable Encouraged good control as its slows the progression of atherosclerotic disease  3. Tobacco abuse - Stable We had a discussion for approximately 10 minutes regarding the absolute need for smoking cessation due to the deleterious nature of tobacco on the vascular system. We discussed the tobacco use would diminish patency of any intervention, and likely significantly worsen progressio of disease. We discussed multiple agents for quitting including replacement therapy or medications to reduce cravings such as Chantix. The patient voices their understanding of the importance of smoking cessation.  4. Type 2 diabetes mellitus without complication, without long-term current use of insulin (HCC) - Stable Encouraged good control as its slows the progression of atherosclerotic disease  5. Essential hypertension - Stable Encouraged good control as its slows the progression of atherosclerotic disease  Current Outpatient Medications on File Prior to Visit  Medication Sig Dispense Refill  . albuterol (VENTOLIN HFA) 108 (90 Base) MCG/ACT inhaler INHALE TWO PUFFS BY MOUTH EVERY 6 HOURS AS NEEDED FOR WHEEZING FOR SHORTNESS OF BREATH 162 each 0  . aspirin 81 MG chewable tablet Chew 1 tablet (81 mg total) by mouth daily. (Patient taking differently: Chew 81 mg by mouth at bedtime. )    . clopidogrel (PLAVIX) 75 MG tablet Take 1 tablet (75 mg total) by mouth daily. 90 tablet 3  . ezetimibe (ZETIA) 10 MG tablet TAKE 1 TABLET BY MOUTH ONCE DAILY 90 tablet 2  . FLOVENT HFA 110 MCG/ACT inhaler INHALE 2 PUFFS BY MOUTH TWICE DAILY 12 g 3  . latanoprost (XALATAN) 0.005 % ophthalmic solution Place 1 drop into both eyes at bedtime.     Marland Kitchen levothyroxine  (SYNTHROID, LEVOTHROID) 112 MCG tablet TAKE ONE TABLET BY MOUTH ONCE DAILY 90 tablet 1  . metoprolol tartrate (LOPRESSOR) 25 MG tablet  TAKE ONE-HALF TABLET BY MOUTH TWICE DAILY 60 tablet 6  . omeprazole (PRILOSEC OTC) 20 MG tablet Take 20 mg by mouth daily.    . rosuvastatin (CRESTOR) 10 MG tablet TAKE 1 TABLET BY MOUTH AT BEDTIME (Patient taking differently: ALTERNTES 5/10MG  AT BEDTIME) 90 tablet 3   No current facility-administered medications on file prior to visit.    There are no Patient Instructions on file for this visit. No follow-ups on file.  Marston Mccadden A Haasini Patnaude, PA-C

## 2017-10-09 ENCOUNTER — Other Ambulatory Visit: Payer: Self-pay | Admitting: Internal Medicine

## 2017-10-24 ENCOUNTER — Other Ambulatory Visit: Payer: Self-pay | Admitting: Internal Medicine

## 2017-11-29 ENCOUNTER — Other Ambulatory Visit: Payer: Self-pay | Admitting: Cardiovascular Disease

## 2017-12-05 ENCOUNTER — Encounter: Payer: Self-pay | Admitting: Internal Medicine

## 2017-12-05 ENCOUNTER — Ambulatory Visit (INDEPENDENT_AMBULATORY_CARE_PROVIDER_SITE_OTHER): Payer: Medicare Other | Admitting: Internal Medicine

## 2017-12-05 VITALS — BP 138/78 | HR 78 | Temp 98.4°F | Resp 18 | Wt 222.6 lb

## 2017-12-05 DIAGNOSIS — E119 Type 2 diabetes mellitus without complications: Secondary | ICD-10-CM

## 2017-12-05 DIAGNOSIS — J4520 Mild intermittent asthma, uncomplicated: Secondary | ICD-10-CM

## 2017-12-05 DIAGNOSIS — E038 Other specified hypothyroidism: Secondary | ICD-10-CM

## 2017-12-05 DIAGNOSIS — Z853 Personal history of malignant neoplasm of breast: Secondary | ICD-10-CM | POA: Diagnosis not present

## 2017-12-05 DIAGNOSIS — D509 Iron deficiency anemia, unspecified: Secondary | ICD-10-CM | POA: Diagnosis not present

## 2017-12-05 DIAGNOSIS — K50919 Crohn's disease, unspecified, with unspecified complications: Secondary | ICD-10-CM | POA: Diagnosis not present

## 2017-12-05 DIAGNOSIS — E7800 Pure hypercholesterolemia, unspecified: Secondary | ICD-10-CM

## 2017-12-05 DIAGNOSIS — E1159 Type 2 diabetes mellitus with other circulatory complications: Secondary | ICD-10-CM

## 2017-12-05 DIAGNOSIS — I10 Essential (primary) hypertension: Secondary | ICD-10-CM

## 2017-12-05 DIAGNOSIS — K219 Gastro-esophageal reflux disease without esophagitis: Secondary | ICD-10-CM

## 2017-12-05 DIAGNOSIS — G35 Multiple sclerosis: Secondary | ICD-10-CM

## 2017-12-05 DIAGNOSIS — E1165 Type 2 diabetes mellitus with hyperglycemia: Secondary | ICD-10-CM

## 2017-12-05 DIAGNOSIS — Z1239 Encounter for other screening for malignant neoplasm of breast: Secondary | ICD-10-CM

## 2017-12-05 DIAGNOSIS — Z720 Tobacco use: Secondary | ICD-10-CM

## 2017-12-05 DIAGNOSIS — I25118 Atherosclerotic heart disease of native coronary artery with other forms of angina pectoris: Secondary | ICD-10-CM

## 2017-12-05 DIAGNOSIS — I6523 Occlusion and stenosis of bilateral carotid arteries: Secondary | ICD-10-CM

## 2017-12-05 DIAGNOSIS — Z1231 Encounter for screening mammogram for malignant neoplasm of breast: Secondary | ICD-10-CM

## 2017-12-05 MED ORDER — FLUTICASONE-SALMETEROL 250-50 MCG/DOSE IN AEPB: 1.0000 | INHALATION_SPRAY | Freq: Two times a day (BID) | RESPIRATORY_TRACT | 3 refills | Status: DC

## 2017-12-05 NOTE — Progress Notes (Signed)
Patient ID: Haley Santos, female   DOB: 1945/06/20, 72 y.o.   MRN: [id number]   Subjective:    Patient ID: Haley Santos, female    DOB: 03/08/1946, 72 y.o.   MRN: [id number]  HPI  Patient here for a scheduled follow up.  She reports she is doing relatively well.  Saw vascular surgery 10/05/17.  Stable carotid ultrasound.  Recommended f/u in one year.  Breathing stable.  No increased cough or congestion.  No chest pain.  No acid reflux.  No abdominal pain.  Bowels moving.  Increased swelling.  Notices if sits more.  No swelling if moves around.  Has been more sedentary lately.  Discussed diet and exercise.  She is smoking.  Discussed the need to quit.      Past Medical History:  Diagnosis Date  . Asthma with bronchitis   . Breast cancer ([place]) 2014  . CAD (coronary artery disease)    a. 08/2014: inf STEMI s/p DES to RCA.   Marland Kitchen COPD (chronic obstructive pulmonary disease) ([place])   . Crohn's disease ([place])   . Dyspnea    DOE  . GERD (gastroesophageal reflux disease)   . Glaucoma   . Glucose intolerance (impaired glucose tolerance)    a. HgA1c 6.2 during 08/2014 admission   . Hypercholesterolemia   . Hypertension   . Hyperthyroidism    a. s/p ablation maintained on synthroid  . Hypothyroidism   . Multiple sclerosis ([place])   . Myocardial infarction ([place])    2016  . Neuromuscular disorder ([place])    MS  . Tobacco abuse    Past Surgical History:  Procedure Laterality Date  . AUGMENTATION MAMMAPLASTY Left   . [place]  . CATARACT EXTRACTION W/PHACO Left 07/19/2017   Procedure: CATARACT EXTRACTION PHACO AND INTRAOCULAR LENS PLACEMENT (IOC);  Surgeon: Birder Robson, MD;  Location: [facility] ORS;  Service: Ophthalmology;  Laterality: Left;  [place] 00:49.0 AP% 14.5 CDE 7.10 Fluid Pack Lot # [id number]  . CATARACT EXTRACTION W/PHACO Right 08/09/2017   Procedure: CATARACT EXTRACTION PHACO AND INTRAOCULAR LENS PLACEMENT ([place]);  Surgeon: Birder Robson, MD;   Location: [facility] ORS;  Service: Ophthalmology;  Laterality: Right;  [place] 00:42 AP% 13.8 CDE 5.88 Fluid pack lot #[id number] H  . COLONOSCOPY  2010   Dr Vira Agar  . CORONARY ANGIOPLASTY     STENT 2016  . LEFT HEART CATHETERIZATION WITH CORONARY ANGIOGRAM N/A 09/01/2014   Procedure: LEFT HEART CATHETERIZATION WITH CORONARY ANGIOGRAM;  Surgeon: Peter M Martinique, MD;  Location: [facility] CATH LAB;  Service: Cardiovascular;  Laterality: N/A;  . MASTECTOMY     RIGHT  . PORTACATH PLACEMENT  11/07/2012  . RIGHT OOPHORECTOMY  1979  . TONSILLECTOMY AND ADENOIDECTOMY  1957   Family History  Problem Relation Age of Onset  . Diabetes Mother   . Heart disease Mother        myocardial infarction  . Glaucoma Mother   . Graves' disease Mother   . Heart attack Mother 27  . Lung disease Father   . Heart disease Unknown        half sister  . Peripheral vascular disease Unknown        half sister  . Breast cancer Neg Hx    Social History   Socioeconomic History  . Marital status: Divorced    Spouse name: Not on file  . Number of children: 1  . Years of education: Not on file  . Highest education  level: Not on file  Occupational History  . Not on file  Social Needs  . Financial resource strain: Not on file  . Food insecurity:    Worry: Not on file    Inability: Not on file  . Transportation needs:    Medical: Not on file    Non-medical: Not on file  Tobacco Use  . Smoking status: Current Every Day Smoker    Packs/day: 0.00    Years: 40.00    Pack years: 0.00    Last attempt to quit: 07/01/2015    Years since quitting: 2.4  . Smokeless tobacco: Never Used  Substance and Sexual Activity  . Alcohol use: Yes    Alcohol/week: 0.0 oz    Comment: RARE  . Drug use: No  . Sexual activity: Not on file  Lifestyle  . Physical activity:    Days per week: Not on file    Minutes per session: Not on file  . Stress: Not on file  Relationships  . Social connections:    Talks on phone: Not on file    Gets  together: Not on file    Attends religious service: Not on file    Active member of club or organization: Not on file    Attends meetings of clubs or organizations: Not on file    Relationship status: Not on file  Other Topics Concern  . Not on file  Social History Narrative  . Not on file    Outpatient Encounter Medications as of 12/05/2017  Medication Sig  . albuterol (VENTOLIN HFA) 108 (90 Base) MCG/ACT inhaler INHALE TWO PUFFS BY MOUTH EVERY 6 HOURS AS NEEDED FOR WHEEZING FOR SHORTNESS OF BREATH  . aspirin 81 MG chewable tablet Chew 1 tablet (81 mg total) by mouth daily. (Patient taking differently: Chew 81 mg by mouth at bedtime. )  . clopidogrel (PLAVIX) 75 MG tablet Take 1 tablet (75 mg total) by mouth daily.  Marland Kitchen ezetimibe (ZETIA) 10 MG tablet TAKE 1 TABLET BY MOUTH ONCE DAILY  . FLOVENT HFA 110 MCG/ACT inhaler INHALE 2 PUFFS BY MOUTH TWICE DAILY  . latanoprost (XALATAN) 0.005 % ophthalmic solution Place 1 drop into both eyes at bedtime.   Marland Kitchen levothyroxine (SYNTHROID, LEVOTHROID) 112 MCG tablet TAKE 1 TABLET BY MOUTH ONCE DAILY  . metoprolol tartrate (LOPRESSOR) 25 MG tablet TAKE 1/2 (ONE-HALF) TABLET BY MOUTH TWICE DAILY  . omeprazole (PRILOSEC OTC) 20 MG tablet Take 20 mg by mouth daily.  . rosuvastatin (CRESTOR) 10 MG tablet TAKE 1 TABLET BY MOUTH AT BEDTIME (Patient taking differently: ALTERNTES 5/10MG AT BEDTIME)  . Fluticasone-Salmeterol (ADVAIR DISKUS) 250-50 MCG/DOSE AEPB Inhale 1 puff into the lungs 2 (two) times daily.   No facility-administered encounter medications on file as of 12/05/2017.     Review of Systems  Constitutional: Negative for appetite change and unexpected weight change.  HENT: Negative for congestion and sinus pressure.   Respiratory: Negative for cough and chest tightness.        Breathing stable.    Cardiovascular: Positive for leg swelling. Negative for chest pain and palpitations.  Gastrointestinal: Negative for abdominal pain, diarrhea, nausea  and vomiting.  Genitourinary: Negative for difficulty urinating and dysuria.  Musculoskeletal: Negative for joint swelling and myalgias.  Skin: Negative for color change and rash.  Neurological: Negative for dizziness, light-headedness and headaches.  Psychiatric/Behavioral: Negative for agitation and dysphoric mood.       Objective:    Physical Exam  Constitutional: She appears well-developed and  well-nourished. No distress.  HENT:  Nose: Nose normal.  Mouth/Throat: Oropharynx is clear and moist.  Neck: Neck supple. No thyromegaly present.  Cardiovascular: Normal rate and regular rhythm.  Pulmonary/Chest: Breath sounds normal. No respiratory distress. She has no wheezes.  Abdominal: Soft. Bowel sounds are normal. There is no tenderness.  Musculoskeletal: She exhibits no tenderness.  No increased lower extremity swelling.    Lymphadenopathy:    She has no cervical adenopathy.  Skin: No rash noted. No erythema.  Psychiatric: She has a normal mood and affect. Her behavior is normal.    BP 138/78 (BP Location: Left Arm, Patient Position: Sitting, Cuff Size: Large)   Pulse 78   Temp 98.4 F (36.9 C) (Oral)   Resp 18   Wt 222 lb 9.6 oz (101 kg)   LMP 06/15/1977   SpO2 98%   BMI 35.93 kg/m  Wt Readings from Last 3 Encounters:  12/05/17 222 lb 9.6 oz (101 kg)  10/05/17 218 lb (98.9 kg)  09/11/17 219 lb (99.3 kg)     Lab Results  Component Value Date   WBC 7.3 03/14/2017   HGB 13.9 03/14/2017   HCT 42.4 03/14/2017   PLT 196.0 03/14/2017   GLUCOSE 142 (H) 08/17/2017   CHOL 143 08/17/2017   TRIG 168.0 (H) 08/17/2017   HDL 42.90 08/17/2017   LDLDIRECT 116.0 03/14/2017   LDLCALC 67 08/17/2017   ALT 22 08/17/2017   AST 28 08/17/2017   NA 139 08/17/2017   K 4.5 08/17/2017   CL 101 08/17/2017   CREATININE 0.90 08/17/2017   BUN 13 08/17/2017   CO2 30 08/17/2017   TSH 1.29 11/08/2016   HGBA1C 7.5 (H) 08/17/2017   MICROALBUR <0.7 11/08/2016       Assessment &  Plan:   Problem List Items Addressed This Visit    Anemia    Has seen GI.  Was scheduled for EGD and colonoscopy.  She canceled.  Discussed with her today.  She declines to have the procedures.  Follow cbc and ferritin.        Asthma    Breathing stable.  Wants to change back to advair.  rx given.        Relevant Medications   Fluticasone-Salmeterol (ADVAIR DISKUS) 250-50 MCG/DOSE AEPB   CAD (coronary artery disease)    Followed by cardiology.  Stable.  Continue risk factor modification.        Carotid artery stenosis    Previously evaluated by [facility].  Last seen 09/2017.  Stable.  Recommended f/u in one year.        Crohn's disease ([place])    Has seen GI.  Was scheduled for colonoscopy and EGD.  Bowels stable.  She canceled her procedures.  Does not want to reschedule.  Follow.        Diabetes mellitus ([place])    Low carb diet and exercise.  Follow met b and a1c.        Relevant Orders   Hemoglobin W0J   Basic metabolic panel   Microalbumin / creatinine urine ratio   GERD (gastroesophageal reflux disease)    Controlled on current regimen.  Follow.        History of breast cancer    Declines further evaluation through oncology.  Mammogram 01/13/17 - Birads II.  She will schedule f/u mammogram.        Hypercholesterolemia    On crestor.  Low cholesterol diet and exercise.  Follow lipid panel and liver function tests.  Relevant Orders   Hepatic function panel   Lipid panel   Hypertension    Blood pressure under good control.  Continue same medication regimen.  Follow pressures.  Follow metabolic panel.        Hypothyroidism    On thyroid replacement.  Follow tsh.        Relevant Orders   TSH   Multiple sclerosis ([place])    Desires no further evaluation and treatment.        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HCC)    Discussed diet and exercise.  Follow met b and a1c.        Tobacco abuse    Discussed the need to quit  smoking.  Declines to quit.  Follow.         Other Visit Diagnoses    Breast cancer screening    -  Primary   Relevant Orders   MM 3D SCREEN BREAST BILATERAL       Einar Pheasant, MD

## 2017-12-06 ENCOUNTER — Encounter: Payer: Self-pay | Admitting: Internal Medicine

## 2017-12-06 NOTE — Assessment & Plan Note (Signed)
Blood pressure under good control.  Continue same medication regimen.  Follow pressures.  Follow metabolic panel.   

## 2017-12-06 NOTE — Assessment & Plan Note (Signed)
Previously evaluated by [facility].  Last seen 09/2017.  Stable.  Recommended f/u in one year.

## 2017-12-06 NOTE — Assessment & Plan Note (Signed)
On thyroid replacement.  Follow tsh.  

## 2017-12-06 NOTE — Assessment & Plan Note (Signed)
Discussed the need to quit smoking.  Declines to quit.  Follow.

## 2017-12-06 NOTE — Assessment & Plan Note (Signed)
Breathing stable.  Wants to change back to advair.  rx given.

## 2017-12-06 NOTE — Assessment & Plan Note (Signed)
Declines further evaluation through oncology.  Mammogram 01/13/17 - Birads II.  She will schedule f/u mammogram.

## 2017-12-06 NOTE — Assessment & Plan Note (Signed)
Controlled on current regimen.  Follow.  

## 2017-12-06 NOTE — Assessment & Plan Note (Signed)
Low carb diet and exercise.  Follow met b and a1c.   

## 2017-12-06 NOTE — Assessment & Plan Note (Signed)
Has seen GI.  Was scheduled for EGD and colonoscopy.  She canceled.  Discussed with her today.  She declines to have the procedures.  Follow cbc and ferritin.

## 2017-12-06 NOTE — Assessment & Plan Note (Signed)
Has seen GI.  Was scheduled for colonoscopy and EGD.  Bowels stable.  She canceled her procedures.  Does not want to reschedule.  Follow.

## 2017-12-06 NOTE — Assessment & Plan Note (Signed)
Discussed diet and exercise.  Follow met b and a1c.   

## 2017-12-06 NOTE — Assessment & Plan Note (Signed)
Followed by cardiology.  Stable.  Continue risk factor modification.

## 2017-12-06 NOTE — Assessment & Plan Note (Signed)
On crestor.  Low cholesterol diet and exercise.  Follow lipid panel and liver function tests.

## 2017-12-06 NOTE — Assessment & Plan Note (Signed)
Desires no further evaluation and treatment.

## 2017-12-14 ENCOUNTER — Other Ambulatory Visit: Payer: Medicare Other

## 2018-01-01 ENCOUNTER — Other Ambulatory Visit: Payer: Self-pay | Admitting: Cardiovascular Disease

## 2018-01-16 ENCOUNTER — Encounter: Payer: Self-pay | Admitting: Internal Medicine

## 2018-01-31 ENCOUNTER — Other Ambulatory Visit: Payer: Self-pay | Admitting: *Deleted

## 2018-01-31 ENCOUNTER — Telehealth: Payer: Self-pay | Admitting: Cardiovascular Disease

## 2018-01-31 ENCOUNTER — Other Ambulatory Visit: Payer: Self-pay | Admitting: Internal Medicine

## 2018-01-31 DIAGNOSIS — Z853 Personal history of malignant neoplasm of breast: Secondary | ICD-10-CM

## 2018-01-31 MED ORDER — METOPROLOL TARTRATE 25 MG PO TABS: ORAL_TABLET | ORAL | 0 refills | Status: DC

## 2018-01-31 NOTE — Telephone Encounter (Signed)
Requested Prescriptions   Signed Prescriptions Disp Refills  . metoprolol tartrate (LOPRESSOR) 25 MG tablet 90 tablet 0    Sig: TAKE 1/2 (ONE-HALF) TABLET BY MOUTH TWICE DAILY    Authorizing Provider: Minna Merritts    Ordering User: Britt Bottom

## 2018-01-31 NOTE — Telephone Encounter (Signed)
°*  STAT* If patient is at the pharmacy, call can be transferred to refill team.   1. Which medications need to be refilled? (please list name of each medication and dose if known)  Metoprolol tartrate 25 MG tablet - take 1/2 table twice daily   2. Which pharmacy/location (including street and city if local pharmacy) is medication to be sent to? [place]  3. Do they need a 30 day or 90 day supply? 90 day

## 2018-01-31 NOTE — Telephone Encounter (Signed)
Requested Prescriptions   Signed Prescriptions Disp Refills  . metoprolol tartrate (LOPRESSOR) 25 MG tablet 90 tablet 0    Sig: TAKE 1/2 (ONE-HALF) TABLET BY MOUTH TWICE DAILY    Authorizing Provider: GOLLAN, TIMOTHY J    Ordering User: Nealy Karapetian C    

## 2018-02-09 ENCOUNTER — Ambulatory Visit
Admission: RE | Admit: 2018-02-09 | Discharge: 2018-02-09 | Disposition: A | Discharge status: Home / Self Care | Payer: Medicare Other | Source: Ambulatory Visit | Attending: Internal Medicine | Admitting: Internal Medicine

## 2018-02-09 DIAGNOSIS — Z853 Personal history of malignant neoplasm of breast: Secondary | ICD-10-CM | POA: Diagnosis not present

## 2018-02-09 DIAGNOSIS — R922 Inconclusive mammogram: Secondary | ICD-10-CM | POA: Diagnosis not present

## 2018-02-19 NOTE — Progress Notes (Signed)
Cardiology Office Note  Date:  02/20/2018   ID:  Haley Santos, DOB Dec 06, 1945, MRN [id number]  PCP:  Einar Pheasant, MD   Chief Complaint  Patient presents with  . other    6 month f/u c/o chest discomfort and muscle/joint pain. Meds reviewed verbally with pt.    HPI:  Haley Santos is a 72 yo woman with  obesity,  long history of smoking who continues to smoke,  Hyperlipidemia, intolerant of statins,  GERD,   hypertension   moderate carotid arterial disease on the left, 50% STEMI in 2016 with stent placed to mid RCA,  Patient reports history of Crohn's Diabetes type 2 with hemoglobin A1c 7.4 who presents for routine follow-up of her coronary artery disease, hyperlipidemia  In follow-up today she feels that she is "falling apart" Statin getting to me Total chol 143, numbers have never been better "hurt all the time"  Chronic joint pain , joint pain, 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  Still smoking Fearful about exercise, not know how much to do She did complete cardiac rehab has done nothing since then Does not want to go to [facility]  Blood pressure stable, no chest pain concerning for angina Denies having significant shortness of breath Episodes of tachycardia, this has not been an issue  Other lab work reviewed with her in detail HBA1C 7.5, was 6.5 two years ago Total cholesterol 143 LDL 67  EKG personally reviewed by myself on todays visit Shows NSR   Rate 72 bpm, no significant ST or T wave changes  Past medical history reviewed April 2016 she developed acute chest pain, bilateral arm pain, neck pain/jaw pain bilaterally.  She had cardiac catheterization, essentially occluded mid RCA with 50-70% mid LAD disease, DES placed the RCA. History of breast cancer, Adriamycin through port in the left chest Carotid ultrasound with 50-69% carotid stenosis  She has seen [facility] in the past and unable to tolerate statin secondary to myalgias. She reports a history of  MS  Review of her carotid ultrasound with her shows 50-69% disease on the left Review of her lab work with her shows total cholesterol 260. Elevated hemoglobin A1c greater than 6   PMH:   has a past medical history of Asthma with bronchitis, Breast cancer ([place]) (2014), CAD (coronary artery disease), COPD (chronic obstructive pulmonary disease) ([place]), Crohn's disease ([place]), Dyspnea, GERD (gastroesophageal reflux disease), Glaucoma, Glucose intolerance (impaired glucose tolerance), Hypercholesterolemia, Hypertension, Hyperthyroidism, Hypothyroidism, Multiple sclerosis ([place]), Myocardial infarction [facility]), Neuromuscular disorder ([place]), and Tobacco abuse.  PSH:    Past Surgical History:  Procedure Laterality Date  . AUGMENTATION MAMMAPLASTY Left   . [place]  . CATARACT EXTRACTION W/PHACO Left 07/19/2017   Procedure: CATARACT EXTRACTION PHACO AND INTRAOCULAR LENS PLACEMENT (IOC);  Surgeon: Birder Robson, MD;  Location: [facility] ORS;  Service: Ophthalmology;  Laterality: Left;  [place] 00:49.0 AP% 14.5 CDE 7.10 Fluid Pack Lot # [id number]  . CATARACT EXTRACTION W/PHACO Right 08/09/2017   Procedure: CATARACT EXTRACTION PHACO AND INTRAOCULAR LENS PLACEMENT ([place]);  Surgeon: Birder Robson, MD;  Location: [facility] ORS;  Service: Ophthalmology;  Laterality: Right;  [place] 00:42 AP% 13.8 CDE 5.88 Fluid pack lot #[id number] H  . COLONOSCOPY  2010   Dr Vira Agar  . CORONARY ANGIOPLASTY     STENT 2016  . LEFT HEART CATHETERIZATION WITH CORONARY ANGIOGRAM N/A 09/01/2014   Procedure: LEFT HEART CATHETERIZATION WITH CORONARY ANGIOGRAM;  Surgeon: Peter M Martinique, MD;  Location: [facility] CATH LAB;  Service: Cardiovascular;  Laterality: N/A;  . MASTECTOMY Right 2014   PATRICAL MASTECOMY RIGHT RAD CHEMO  . PORTACATH PLACEMENT  11/07/2012  . RIGHT OOPHORECTOMY  1979  . TONSILLECTOMY AND ADENOIDECTOMY  1957    Current Outpatient Medications  Medication Sig Dispense Refill  . albuterol  (VENTOLIN HFA) 108 (90 Base) MCG/ACT inhaler INHALE TWO PUFFS BY MOUTH EVERY 6 HOURS AS NEEDED FOR WHEEZING FOR SHORTNESS OF BREATH 162 each 0  . aspirin 81 MG chewable tablet Chew 1 tablet (81 mg total) by mouth daily. (Patient taking differently: Chew 81 mg by mouth at bedtime. )    . clopidogrel (PLAVIX) 75 MG tablet Take 1 tablet (75 mg total) by mouth daily. 90 tablet 3  . ezetimibe (ZETIA) 10 MG tablet TAKE 1 TABLET BY MOUTH ONCE DAILY 90 tablet 2  . FLOVENT HFA 110 MCG/ACT inhaler INHALE 2 PUFFS BY MOUTH TWICE DAILY 12 g 3  . latanoprost (XALATAN) 0.005 % ophthalmic solution Place 1 drop into both eyes at bedtime.     Marland Kitchen levothyroxine (SYNTHROID, LEVOTHROID) 112 MCG tablet TAKE 1 TABLET BY MOUTH ONCE DAILY 90 tablet 1  . metoprolol tartrate (LOPRESSOR) 25 MG tablet TAKE 1/2 (ONE-HALF) TABLET BY MOUTH TWICE DAILY 90 tablet 0  . omeprazole (PRILOSEC OTC) 20 MG tablet Take 20 mg by mouth daily.    . rosuvastatin (CRESTOR) 10 MG tablet TAKE 1 TABLET BY MOUTH AT BEDTIME (Patient taking differently: ALTERNTES 5/10MG  AT BEDTIME) 90 tablet 3   No current facility-administered medications for this visit.      Allergies:   Statins; Clindamycin/lincomycin; Codeine; Shellfish allergy; and Valium [diazepam]   Social History:  The patient  reports that she has been smoking. She has a 40.00 pack-year smoking history. She has never used smokeless tobacco. She reports that she drinks alcohol. She reports that she does not use drugs.   Family History:   family history includes Diabetes in her mother; Glaucoma in her mother; Berenice Primas' disease in her mother; Heart attack (age of onset: 52) in her mother; Heart disease in her mother and unknown relative; Lung disease in her father; Peripheral vascular disease in her unknown relative.    Review of Systems: Review of Systems  Constitutional: Negative.   Respiratory: Negative.   Cardiovascular: Negative.        Tachycardia  Gastrointestinal: Negative.    Musculoskeletal: Positive for joint pain.  Neurological: Negative.   Psychiatric/Behavioral: Negative.   All other systems reviewed and are negative.    PHYSICAL EXAM: VS:  BP 128/68 (BP Location: Left Arm, Patient Position: Sitting, Cuff Size: Large)   Pulse 72   Ht 5\' 5"  (1.651 m)   Wt 217 lb (98.4 kg)   LMP 06/15/1977   BMI 36.11 kg/m  , BMI Body mass index is 36.11 kg/m. Constitutional:  oriented to person, place, and time. No distress.  HENT:  Head: Normocephalic and atraumatic.  Eyes:  no discharge. No scleral icterus.  Neck: Normal range of motion. Neck supple. No JVD present.  Cardiovascular: Normal rate, regular rhythm, normal heart sounds and intact distal pulses. Exam reveals no gallop and no friction rub. No edema No murmur heard. Pulmonary/Chest: Effort normal and breath sounds normal. No stridor. No respiratory distress.  no wheezes.  no rales.  no tenderness.  Abdominal: Soft.  no distension.  no tenderness.  Musculoskeletal: Normal range of motion.  no  tenderness or deformity.  Neurological:  normal muscle tone. Coordination normal. No atrophy Skin: Skin is warm  and dry. No rash noted. not diaphoretic.  Psychiatric:  normal mood and affect. behavior is normal. Thought content normal.   Recent Labs: 03/14/2017: Hemoglobin 13.9; Platelets 196.0 08/17/2017: ALT 22; BUN 13; Creatinine, Ser 0.90; Potassium 4.5; Sodium 139    Lipid Panel Lab Results  Component Value Date   CHOL 143 08/17/2017   HDL 42.90 08/17/2017   LDLCALC 67 08/17/2017   TRIG 168.0 (H) 08/17/2017      Wt Readings from Last 3 Encounters:  02/20/18 217 lb (98.4 kg)  12/05/17 222 lb 9.6 oz (101 kg)  10/05/17 218 lb (98.9 kg)     ASSESSMENT AND PLAN:  Tachycardia Continue metoprolol, not an active issue on today's visit  Essential hypertension - Plan: EKG 12-Lead Blood pressure is well controlled on today's visit. No changes made to the medications. 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 No further workup at this time. Continue current medication regimen.  Stable  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[place]) - Plan: EKG 12-Lead Discussed dietary changes Needs to get exercising  Tobacco abuse  she continues to smoke daily She does not want assistance, discussed various strategies to quit smoking Does not seem ready to quit.  This was again discussed with her  Stenosis of left carotid artery Significant stenosis, recommended smoking cessation, aggressive lipid management Cholesterol at goal   Total encounter time more than 25 minutes  Greater than 50% was spent in counseling and coordination of care with the patient   Disposition:   F/U  6 months   Orders Placed This Encounter  Procedures  . EKG 12-Lead     Signed, Esmond Plants, M.D., Ph.D. 02/20/2018  [place], [place]

## 2018-02-20 ENCOUNTER — Ambulatory Visit (INDEPENDENT_AMBULATORY_CARE_PROVIDER_SITE_OTHER): Payer: Medicare Other | Admitting: Cardiovascular Disease

## 2018-02-20 ENCOUNTER — Encounter: Payer: Self-pay | Admitting: Cardiovascular Disease

## 2018-02-20 VITALS — BP 128/68 | HR 72 | Ht 65.0 in | Wt 217.0 lb

## 2018-02-20 DIAGNOSIS — E1165 Type 2 diabetes mellitus with hyperglycemia: Secondary | ICD-10-CM | POA: Diagnosis not present

## 2018-02-20 DIAGNOSIS — I6523 Occlusion and stenosis of bilateral carotid arteries: Secondary | ICD-10-CM

## 2018-02-20 DIAGNOSIS — I872 Venous insufficiency (chronic) (peripheral): Secondary | ICD-10-CM | POA: Diagnosis not present

## 2018-02-20 DIAGNOSIS — E7800 Pure hypercholesterolemia, unspecified: Secondary | ICD-10-CM | POA: Diagnosis not present

## 2018-02-20 DIAGNOSIS — I6522 Occlusion and stenosis of left carotid artery: Secondary | ICD-10-CM

## 2018-02-20 DIAGNOSIS — R000 Tachycardia, unspecified: Secondary | ICD-10-CM

## 2018-02-20 DIAGNOSIS — E1159 Type 2 diabetes mellitus with other circulatory complications: Secondary | ICD-10-CM

## 2018-02-20 DIAGNOSIS — G35 Multiple sclerosis: Secondary | ICD-10-CM | POA: Diagnosis not present

## 2018-02-20 DIAGNOSIS — I25118 Atherosclerotic heart disease of native coronary artery with other forms of angina pectoris: Secondary | ICD-10-CM | POA: Diagnosis not present

## 2018-02-20 DIAGNOSIS — I10 Essential (primary) hypertension: Secondary | ICD-10-CM

## 2018-02-20 NOTE — Patient Instructions (Signed)

## 2018-03-07 DIAGNOSIS — H401131 Primary open-angle glaucoma, bilateral, mild stage: Secondary | ICD-10-CM | POA: Diagnosis not present

## 2018-04-02 ENCOUNTER — Other Ambulatory Visit: Payer: Self-pay

## 2018-04-09 ENCOUNTER — Ambulatory Visit (INDEPENDENT_AMBULATORY_CARE_PROVIDER_SITE_OTHER): Payer: Medicare Other | Admitting: Internal Medicine

## 2018-04-09 ENCOUNTER — Encounter: Payer: Self-pay | Admitting: Internal Medicine

## 2018-04-09 VITALS — BP 136/72 | HR 74 | Temp 97.9°F | Resp 16 | Ht 65.0 in | Wt 216.4 lb

## 2018-04-09 DIAGNOSIS — Z0000 Encounter for general adult medical examination without abnormal findings: Secondary | ICD-10-CM | POA: Diagnosis not present

## 2018-04-09 DIAGNOSIS — J4520 Mild intermittent asthma, uncomplicated: Secondary | ICD-10-CM

## 2018-04-09 DIAGNOSIS — G35 Multiple sclerosis: Secondary | ICD-10-CM | POA: Diagnosis not present

## 2018-04-09 DIAGNOSIS — K219 Gastro-esophageal reflux disease without esophagitis: Secondary | ICD-10-CM

## 2018-04-09 DIAGNOSIS — R197 Diarrhea, unspecified: Secondary | ICD-10-CM | POA: Diagnosis not present

## 2018-04-09 DIAGNOSIS — E038 Other specified hypothyroidism: Secondary | ICD-10-CM | POA: Diagnosis not present

## 2018-04-09 DIAGNOSIS — D509 Iron deficiency anemia, unspecified: Secondary | ICD-10-CM

## 2018-04-09 DIAGNOSIS — I25118 Atherosclerotic heart disease of native coronary artery with other forms of angina pectoris: Secondary | ICD-10-CM | POA: Diagnosis not present

## 2018-04-09 DIAGNOSIS — K50919 Crohn's disease, unspecified, with unspecified complications: Secondary | ICD-10-CM | POA: Diagnosis not present

## 2018-04-09 DIAGNOSIS — E7800 Pure hypercholesterolemia, unspecified: Secondary | ICD-10-CM

## 2018-04-09 DIAGNOSIS — E119 Type 2 diabetes mellitus without complications: Secondary | ICD-10-CM

## 2018-04-09 DIAGNOSIS — I6523 Occlusion and stenosis of bilateral carotid arteries: Secondary | ICD-10-CM

## 2018-04-09 DIAGNOSIS — Z720 Tobacco use: Secondary | ICD-10-CM

## 2018-04-09 LAB — HEPATIC FUNCTION PANEL
ALT: 17 U/L (ref 0–35)
AST: 21 U/L (ref 0–37)
Albumin: 3.9 g/dL (ref 3.5–5.2)
Alkaline Phosphatase: 86 U/L (ref 39–117)
Bilirubin, Direct: 0.1 mg/dL (ref 0.0–0.3)
TOTAL PROTEIN: 6.9 g/dL (ref 6.0–8.3)
Total Bilirubin: 0.5 mg/dL (ref 0.2–1.2)

## 2018-04-09 LAB — LIPID PANEL
CHOL/HDL RATIO: 4
Cholesterol: 130 mg/dL (ref 0–200)
HDL: 35 mg/dL — ABNORMAL LOW (ref >39.00)
LDL CALC: 66 mg/dL (ref 0–99)
NonHDL: 94.79
TRIGLYCERIDES: 144 mg/dL (ref 0.0–149.0)
VLDL: 28.8 mg/dL (ref 0.0–40.0)

## 2018-04-09 LAB — MICROALBUMIN / CREATININE URINE RATIO
Creatinine,U: 58.8 mg/dL
MICROALB/CREAT RATIO: 1.9 mg/g (ref 0.0–30.0)
Microalb, Ur: 1.1 mg/dL (ref 0.0–1.9)

## 2018-04-09 LAB — CBC WITH DIFFERENTIAL/PLATELET
BASOS ABS: 0 10*3/uL (ref 0.0–0.1)
Basophils Relative: 0.7 % (ref 0.0–3.0)
Eosinophils Absolute: 0.3 10*3/uL (ref 0.0–0.7)
Eosinophils Relative: 4.9 % (ref 0.0–5.0)
HEMATOCRIT: 37.2 % (ref 36.0–46.0)
Hemoglobin: 12.4 g/dL (ref 12.0–15.0)
LYMPHS PCT: 17.4 % (ref 12.0–46.0)
Lymphs Abs: 1.2 10*3/uL (ref 0.7–4.0)
MCHC: 33.3 g/dL (ref 30.0–36.0)
MCV: 79.8 fl (ref 78.0–100.0)
MONOS PCT: 6.8 % (ref 3.0–12.0)
Monocytes Absolute: 0.5 10*3/uL (ref 0.1–1.0)
NEUTROS ABS: 4.7 10*3/uL (ref 1.4–7.7)
Neutrophils Relative %: 70.2 % (ref 43.0–77.0)
PLATELETS: 207 10*3/uL (ref 150.0–400.0)
RBC: 4.67 Mil/uL (ref 3.87–5.11)
RDW: 15.1 % (ref 11.5–15.5)
WBC: 6.7 10*3/uL (ref 4.0–10.5)

## 2018-04-09 LAB — BASIC METABOLIC PANEL
BUN: 8 mg/dL (ref 6–23)
CHLORIDE: 103 meq/L (ref 96–112)
CO2: 28 meq/L (ref 19–32)
Calcium: 9.1 mg/dL (ref 8.4–10.5)
Creatinine, Ser: 0.82 mg/dL (ref 0.40–1.20)
GFR: 73.18 mL/min (ref >60.00)
Glucose, Bld: 130 mg/dL — ABNORMAL HIGH (ref 70–99)
Potassium: 3.9 mEq/L (ref 3.5–5.1)
SODIUM: 138 meq/L (ref 135–145)

## 2018-04-09 LAB — TSH: TSH: 2.97 u[IU]/mL (ref 0.35–4.50)

## 2018-04-09 LAB — HEMOGLOBIN A1C: HEMOGLOBIN A1C: 7.4 % — AB (ref 4.6–6.5)

## 2018-04-09 NOTE — Assessment & Plan Note (Signed)
Physical today 04/09/18.  PAP 11/08/16 - negative with negative HPV.  Colonoscopy 08/2015.  Recommended f/u in 10 years.

## 2018-04-09 NOTE — Progress Notes (Signed)
Patient ID: Haley Santos, female   DOB: March 05, 1946, 72 y.o.   MRN: [id number]   Subjective:    Patient ID: Haley Santos, female    DOB: 09-06-1945, 72 y.o.   MRN: [id number]  HPI  Patient with past history of CAD, breast cancer, asthma/COPD, GERD and hypercholesterolemia.  She comes in today to follow up on these issues as well as for a complete physical exam.  She was just evaluated by Dr Rockey Situ 02/20/18. Note reviewed.  Recommended regular exercise routine.  No changes made.  She continues to smoke.  States she is not going to quit.  No chest pain.  Breathing stable.  No increased cough or congestion.  No acid reflux.  No abdominal pain.  Some recent diarrhea.  Better now.       Past Medical History:  Diagnosis Date  . Asthma with bronchitis   . Breast cancer ([place]) 2014  . CAD (coronary artery disease)    a. 08/2014: inf STEMI s/p DES to RCA.   Marland Kitchen COPD (chronic obstructive pulmonary disease) ([place])   . Crohn's disease ([place])   . Dyspnea    DOE  . GERD (gastroesophageal reflux disease)   . Glaucoma   . Glucose intolerance (impaired glucose tolerance)    a. HgA1c 6.2 during 08/2014 admission   . Hypercholesterolemia   . Hypertension   . Hyperthyroidism    a. s/p ablation maintained on synthroid  . Hypothyroidism   . Multiple sclerosis ([place])   . Myocardial infarction ([place])    2016  . Neuromuscular disorder ([place])    MS  . Tobacco abuse    Past Surgical History:  Procedure Laterality Date  . AUGMENTATION MAMMAPLASTY Left   . [place]  . CATARACT EXTRACTION W/PHACO Left 07/19/2017   Procedure: CATARACT EXTRACTION PHACO AND INTRAOCULAR LENS PLACEMENT (IOC);  Surgeon: Birder Robson, MD;  Location: [facility] ORS;  Service: Ophthalmology;  Laterality: Left;  [place] 00:49.0 AP% 14.5 CDE 7.10 Fluid Pack Lot # [id number]  . CATARACT EXTRACTION W/PHACO Right 08/09/2017   Procedure: CATARACT EXTRACTION PHACO AND INTRAOCULAR LENS PLACEMENT ([place]);   Surgeon: Birder Robson, MD;  Location: [facility] ORS;  Service: Ophthalmology;  Laterality: Right;  [place] 00:42 AP% 13.8 CDE 5.88 Fluid pack lot #[id number]  . COLONOSCOPY  2010   Dr Vira Agar  . CORONARY ANGIOPLASTY     STENT 2016  . LEFT HEART CATHETERIZATION WITH CORONARY ANGIOGRAM N/A 09/01/2014   Procedure: LEFT HEART CATHETERIZATION WITH CORONARY ANGIOGRAM;  Surgeon: Peter M Martinique, MD;  Location: [facility] CATH LAB;  Service: Cardiovascular;  Laterality: N/A;  . MASTECTOMY Right 2014   PATRICAL MASTECOMY RIGHT RAD CHEMO  . PORTACATH PLACEMENT  11/07/2012  . RIGHT OOPHORECTOMY  1979  . TONSILLECTOMY AND ADENOIDECTOMY  1957   Family History  Problem Relation Age of Onset  . Diabetes Mother   . Heart disease Mother        myocardial infarction  . Glaucoma Mother   . Graves' disease Mother   . Heart attack Mother 42  . Lung disease Father   . Heart disease Unknown        half sister  . Peripheral vascular disease Unknown        half sister  . Breast cancer Neg Hx    Social History   Socioeconomic History  . Marital status: Divorced    Spouse name: Not on file  . Number of children: 1  . Years of  education: Not on file  . Highest education level: Not on file  Occupational History  . Not on file  Social Needs  . Financial resource strain: Not on file  . Food insecurity:    Worry: Not on file    Inability: Not on file  . Transportation needs:    Medical: Not on file    Non-medical: Not on file  Tobacco Use  . Smoking status: Current Every Day Smoker    Packs/day: 1.00    Years: 40.00    Pack years: 40.00    Last attempt to quit: 07/01/2015    Years since quitting: 2.7  . Smokeless tobacco: Never Used  Substance and Sexual Activity  . Alcohol use: Yes    Alcohol/week: 0.0 standard drinks    Comment: RARE  . Drug use: No  . Sexual activity: Not on file  Lifestyle  . Physical activity:    Days per week: Not on file    Minutes per session: Not on file  . Stress: Not on  file  Relationships  . Social connections:    Talks on phone: Not on file    Gets together: Not on file    Attends religious service: Not on file    Active member of club or organization: Not on file    Attends meetings of clubs or organizations: Not on file    Relationship status: Not on file  Other Topics Concern  . Not on file  Social History Narrative  . Not on file    Outpatient Encounter Medications as of 04/09/2018  Medication Sig  . albuterol (VENTOLIN HFA) 108 (90 Base) MCG/ACT inhaler INHALE TWO PUFFS BY MOUTH EVERY 6 HOURS AS NEEDED FOR WHEEZING FOR SHORTNESS OF BREATH  . aspirin 81 MG chewable tablet Chew 1 tablet (81 mg total) by mouth daily. (Patient taking differently: Chew 81 mg by mouth at bedtime. )  . ezetimibe (ZETIA) 10 MG tablet TAKE 1 TABLET BY MOUTH ONCE DAILY  . Fluticasone-Salmeterol (ADVAIR) 250-50 MCG/DOSE AEPB Inhale 1 puff into the lungs 2 (two) times daily.  Marland Kitchen latanoprost (XALATAN) 0.005 % ophthalmic solution Place 1 drop into both eyes at bedtime.   Marland Kitchen levothyroxine (SYNTHROID, LEVOTHROID) 112 MCG tablet TAKE 1 TABLET BY MOUTH ONCE DAILY  . metoprolol tartrate (LOPRESSOR) 25 MG tablet TAKE 1/2 (ONE-HALF) TABLET BY MOUTH TWICE DAILY  . omeprazole (PRILOSEC OTC) 20 MG tablet Take 20 mg by mouth daily.  . rosuvastatin (CRESTOR) 10 MG tablet TAKE 1 TABLET BY MOUTH AT BEDTIME (Patient taking differently: ALTERNTES 5/10MG AT BEDTIME)  . [DISCONTINUED] clopidogrel (PLAVIX) 75 MG tablet Take 1 tablet (75 mg total) by mouth daily.  . [DISCONTINUED] FLOVENT HFA 110 MCG/ACT inhaler INHALE 2 PUFFS BY MOUTH TWICE DAILY   No facility-administered encounter medications on file as of 04/09/2018.     Review of Systems  Constitutional: Negative for appetite change and unexpected weight change.  HENT: Negative for congestion and sinus pressure.   Eyes: Negative for pain and visual disturbance.  Respiratory: Negative for cough, chest tightness and shortness of breath.     Cardiovascular: Negative for chest pain, palpitations and leg swelling.  Gastrointestinal: Positive for diarrhea. Negative for abdominal pain, nausea and vomiting.  Genitourinary: Negative for difficulty urinating and dysuria.  Musculoskeletal: Negative for joint swelling and myalgias.  Skin: Negative for color change and rash.  Neurological: Negative for dizziness, light-headedness and headaches.  Hematological: Negative for adenopathy. Does not bruise/bleed easily.  Psychiatric/Behavioral: Negative for agitation  and dysphoric mood.       Objective:    Physical Exam  Constitutional: She appears well-developed and well-nourished. No distress.  HENT:  Nose: Nose normal.  Mouth/Throat: Oropharynx is clear and moist.  Eyes: Conjunctivae are normal. Right eye exhibits no discharge. Left eye exhibits no discharge. No scleral icterus.  Neck: Neck supple. No thyromegaly present.  Cardiovascular: Normal rate and regular rhythm.  Pulmonary/Chest: Breath sounds normal. No respiratory distress. She has no wheezes.  Breasts:  No nipple discharge or nipple retraction present.  S/p right partial mastectomy.  No palpable nodules or axillary adenopathy bilateral.  Abdominal: Soft. Bowel sounds are normal. There is no tenderness.  Genitourinary:  Genitourinary Comments: Not performed.    Musculoskeletal: She exhibits no edema or tenderness.  Lymphadenopathy:    She has no cervical adenopathy.  Neurological: She is alert.  Skin: No rash noted. No erythema.  Psychiatric: She has a normal mood and affect. Her behavior is normal.    BP 136/72 (BP Location: Left Arm, Patient Position: Sitting, Cuff Size: Normal)   Pulse 74   Temp 97.9 F (36.6 C) (Oral)   Resp 16   Ht _0  (1.651 m)   Wt 216 lb 6.4 oz (98.2 kg)   LMP 06/15/1977   SpO2 98%   BMI 36.01 kg/m  Wt Readings from Last 3 Encounters:  04/09/18 216 lb 6.4 oz (98.2 kg)  02/20/18 217 lb (98.4 kg)  12/05/17 222 lb 9.6 oz (101 kg)      Lab Results  Component Value Date   WBC 6.7 04/09/2018   HGB 12.4 04/09/2018   HCT 37.2 04/09/2018   PLT 207.0 04/09/2018   GLUCOSE 130 (H) 04/09/2018   CHOL 130 04/09/2018   TRIG 144.0 04/09/2018   HDL 35.00 (L) 04/09/2018   LDLDIRECT 116.0 03/14/2017   LDLCALC 66 04/09/2018   ALT 17 04/09/2018   AST 21 04/09/2018   NA 138 04/09/2018   K 3.9 04/09/2018   CL 103 04/09/2018   CREATININE 0.82 04/09/2018   BUN 8 04/09/2018   CO2 28 04/09/2018   TSH 2.97 04/09/2018   HGBA1C 7.4 (H) 04/09/2018   MICROALBUR 1.1 04/09/2018    Mm Diag Breast Tomo Bilateral  Result Date: 02/09/2018 CLINICAL DATA:  72 year old female status post malignant right lumpectomy in 2014. Remote left augmentation. EXAM: DIGITAL DIAGNOSTIC BILATERAL MAMMOGRAM WITH CAD AND TOMO Note is made of a left prepectoral implant. Standard and implant displaced views were obtained. COMPARISON:  Previous exam(s). ACR Breast Density Category c: The breast tissue is heterogeneously dense, which may obscure small masses. FINDINGS: Stable post lumpectomy changes are noted on the right. No new or suspicious mammographic findings are identified in either breast. The parenchymal pattern is stable. Mammographic images were processed with CAD. IMPRESSION: 1. No mammographic evidence of malignancy in either breast. 2. Stable right breast post lumpectomy changes. RECOMMENDATION: Screening mammogram in one year.(Code:[id number]-B-01Y) I have discussed the findings and recommendations with the patient. Results were also provided in writing at the conclusion of the visit. If applicable, a reminder letter will be sent to the patient regarding the next appointment. BI-RADS CATEGORY  2: Benign. Electronically Signed   By: Kristopher Oppenheim M.D.   On: 02/09/2018 11:15       Assessment & Plan:   Problem List Items Addressed This Visit    Anemia    Has seen GI.   Follow cbc and ferritin.        Asthma  Breathing stable.        Relevant  Medications   Fluticasone-Salmeterol (ADVAIR) 250-50 MCG/DOSE AEPB   CAD (coronary artery disease)    Followed by cardiology.  Recent evaluated by Dr Rockey Situ.  Note reviewed. No changes made.  Follow.        Carotid artery stenosis    Evaluated by [facility].  Last evaluated 09/2017.  Stable.  Recommended f/u in one year.        Crohn's disease ([place])    Has seen GI.   Bowels back to her baseline now.  Feels stable.  Follow.        Diabetes mellitus ([place])    Low carb diet and exercise.  Have discussed treatment options and medication. She declines.  Follow met b and a1c.        GERD (gastroesophageal reflux disease)    Controlled on current regimen.  Follow.        Health care maintenance    Physical today 04/09/18.  PAP 11/08/16 - negative with negative HPV.  Colonoscopy 08/2015.  Recommended f/u in 10 years.        Hypercholesterolemia   Hypothyroidism   Multiple sclerosis ([place])    Desires no further w/up or evaluation.        Tobacco abuse    Continues to smoke.  Discussed the need to quit.  She declines.  Follow.         Other Visit Diagnoses    Diarrhea, unspecified type    -  Primary   Relevant Orders   CBC with Differential/Platelet (Completed)       Einar Pheasant, MD

## 2018-04-10 ENCOUNTER — Other Ambulatory Visit: Payer: Self-pay | Admitting: Cardiovascular Disease

## 2018-04-15 ENCOUNTER — Encounter: Payer: Self-pay | Admitting: Internal Medicine

## 2018-04-15 NOTE — Assessment & Plan Note (Signed)
Controlled on current regimen.  Follow.  

## 2018-04-15 NOTE — Assessment & Plan Note (Signed)
Continues to smoke.  Discussed the need to quit.  She declines.  Follow.

## 2018-04-15 NOTE — Assessment & Plan Note (Addendum)
Has seen GI.   Follow cbc and ferritin.

## 2018-04-15 NOTE — Assessment & Plan Note (Signed)
Breathing stable.

## 2018-04-15 NOTE — Assessment & Plan Note (Signed)
Evaluated by [facility].  Last evaluated 09/2017.  Stable.  Recommended f/u in one year.

## 2018-04-15 NOTE — Assessment & Plan Note (Addendum)
Has seen GI.   Bowels back to her baseline now.  Feels stable.  Follow.

## 2018-04-15 NOTE — Assessment & Plan Note (Signed)
Low carb diet and exercise.  Have discussed treatment options and medication. She declines.  Follow met b and a1c.

## 2018-04-15 NOTE — Assessment & Plan Note (Signed)
Followed by cardiology.  Recent evaluated by Dr Rockey Situ.  Note reviewed. No changes made.  Follow.

## 2018-04-15 NOTE — Assessment & Plan Note (Signed)
Desires no further w/up or evaluation.

## 2018-04-29 ENCOUNTER — Other Ambulatory Visit: Payer: Self-pay | Admitting: Internal Medicine

## 2018-04-29 ENCOUNTER — Other Ambulatory Visit: Payer: Self-pay | Admitting: Cardiovascular Disease

## 2018-07-14 ENCOUNTER — Other Ambulatory Visit: Payer: Self-pay | Admitting: Internal Medicine

## 2018-07-29 ENCOUNTER — Other Ambulatory Visit: Payer: Self-pay | Admitting: Cardiovascular Disease

## 2018-08-01 ENCOUNTER — Other Ambulatory Visit: Payer: Self-pay | Admitting: Internal Medicine

## 2018-10-08 ENCOUNTER — Other Ambulatory Visit: Payer: Self-pay | Admitting: Cardiovascular Disease

## 2018-10-11 ENCOUNTER — Ambulatory Visit (INDEPENDENT_AMBULATORY_CARE_PROVIDER_SITE_OTHER): Payer: Medicare Other | Admitting: Vascular Surgery

## 2018-10-11 ENCOUNTER — Encounter (INDEPENDENT_AMBULATORY_CARE_PROVIDER_SITE_OTHER): Payer: Medicare Other

## 2018-10-15 DIAGNOSIS — H401131 Primary open-angle glaucoma, bilateral, mild stage: Secondary | ICD-10-CM | POA: Diagnosis not present

## 2018-10-16 ENCOUNTER — Other Ambulatory Visit: Payer: Self-pay | Admitting: Internal Medicine

## 2018-10-22 ENCOUNTER — Other Ambulatory Visit: Payer: Self-pay | Admitting: Internal Medicine

## 2018-10-25 ENCOUNTER — Other Ambulatory Visit: Payer: Self-pay

## 2018-10-25 ENCOUNTER — Encounter (INDEPENDENT_AMBULATORY_CARE_PROVIDER_SITE_OTHER): Payer: Self-pay | Admitting: Vascular Surgery

## 2018-10-25 ENCOUNTER — Ambulatory Visit (INDEPENDENT_AMBULATORY_CARE_PROVIDER_SITE_OTHER): Payer: Medicare Other

## 2018-10-25 ENCOUNTER — Ambulatory Visit (INDEPENDENT_AMBULATORY_CARE_PROVIDER_SITE_OTHER): Payer: Medicare Other | Admitting: Vascular Surgery

## 2018-10-25 VITALS — BP 136/89 | HR 83 | Resp 12 | Ht 66.0 in | Wt 211.0 lb

## 2018-10-25 DIAGNOSIS — I872 Venous insufficiency (chronic) (peripheral): Secondary | ICD-10-CM

## 2018-10-25 DIAGNOSIS — Z79899 Other long term (current) drug therapy: Secondary | ICD-10-CM

## 2018-10-25 DIAGNOSIS — I890 Lymphedema, not elsewhere classified: Secondary | ICD-10-CM | POA: Diagnosis not present

## 2018-10-25 DIAGNOSIS — Z7982 Long term (current) use of aspirin: Secondary | ICD-10-CM | POA: Diagnosis not present

## 2018-10-25 DIAGNOSIS — I6523 Occlusion and stenosis of bilateral carotid arteries: Secondary | ICD-10-CM | POA: Diagnosis not present

## 2018-10-25 DIAGNOSIS — E7800 Pure hypercholesterolemia, unspecified: Secondary | ICD-10-CM

## 2018-10-25 DIAGNOSIS — F17200 Nicotine dependence, unspecified, uncomplicated: Secondary | ICD-10-CM | POA: Diagnosis not present

## 2018-10-25 DIAGNOSIS — I25118 Atherosclerotic heart disease of native coronary artery with other forms of angina pectoris: Secondary | ICD-10-CM | POA: Diagnosis not present

## 2018-10-25 DIAGNOSIS — I10 Essential (primary) hypertension: Secondary | ICD-10-CM

## 2018-10-25 DIAGNOSIS — Z7902 Long term (current) use of antithrombotics/antiplatelets: Secondary | ICD-10-CM | POA: Diagnosis not present

## 2018-10-25 NOTE — Progress Notes (Signed)
MRN : [id number]  Haley Santos is a 73 y.o. (January 07, 1946) female who presents with chief complaint of No chief complaint on file. Marland Kitchen  History of Present Illness:   The patient is seen for evaluation of carotid stenosis. The carotid stenosis was identified years ago by ultrasound.  The patient denies amaurosis fugax. There is no recent history of TIA symptoms or focal motor deficits. There is no prior documented CVA.  There is no history of migraine headaches. There is no history of seizures.  The patient is taking enteric-coated aspirin 81 mg daily.  She notes her swelling has essentially resolved unless "I stand in the heat for a long time"  The patient has a history of coronary artery disease, no recent episodes of angina or shortness of breath. The patient denies PAD or claudication symptoms. There is a history of hyperlipidemia which is being treated with a statin.   Carotid duplex shows 40-59% bilateral ICA stenosis  No outpatient medications have been marked as taking for the 10/25/18 encounter (Appointment) with Delana Meyer, Dolores Lory, MD.    Past Medical History:  Diagnosis Date  . Asthma with bronchitis   . Breast cancer ([place]) 2014  . CAD (coronary artery disease)    a. 08/2014: inf STEMI s/p DES to RCA.   Marland Kitchen COPD (chronic obstructive pulmonary disease) ([place])   . Crohn's disease ([place])   . Dyspnea    DOE  . GERD (gastroesophageal reflux disease)   . Glaucoma   . Glucose intolerance (impaired glucose tolerance)    a. HgA1c 6.2 during 08/2014 admission   . Hypercholesterolemia   . Hypertension   . Hyperthyroidism    a. s/p ablation maintained on synthroid  . Hypothyroidism   . Multiple sclerosis ([place])   . Myocardial infarction ([place])    2016  . Neuromuscular disorder ([place])    MS  . Tobacco abuse     Past Surgical History:  Procedure Laterality Date  . AUGMENTATION MAMMAPLASTY Left   . [place]  . CATARACT  EXTRACTION W/PHACO Left 07/19/2017   Procedure: CATARACT EXTRACTION PHACO AND INTRAOCULAR LENS PLACEMENT (IOC);  Surgeon: Birder Robson, MD;  Location: [facility] ORS;  Service: Ophthalmology;  Laterality: Left;  [place] 00:49.0 AP% 14.5 CDE 7.10 Fluid Pack Lot # [id number]  . CATARACT EXTRACTION W/PHACO Right 08/09/2017   Procedure: CATARACT EXTRACTION PHACO AND INTRAOCULAR LENS PLACEMENT ([place]);  Surgeon: Birder Robson, MD;  Location: [facility] ORS;  Service: Ophthalmology;  Laterality: Right;  [place] 00:42 AP% 13.8 CDE 5.88 Fluid pack lot #[id number]  . COLONOSCOPY  2010   Dr Vira Agar  . CORONARY ANGIOPLASTY     STENT 2016  . LEFT HEART CATHETERIZATION WITH CORONARY ANGIOGRAM N/A 09/01/2014   Procedure: LEFT HEART CATHETERIZATION WITH CORONARY ANGIOGRAM;  Surgeon: Peter M Martinique, MD;  Location: [facility] CATH LAB;  Service: Cardiovascular;  Laterality: N/A;  . MASTECTOMY Right 2014   PATRICAL MASTECOMY RIGHT RAD CHEMO  . PORTACATH PLACEMENT  11/07/2012  . RIGHT OOPHORECTOMY  1979  . TONSILLECTOMY AND ADENOIDECTOMY  71    Social History Social History   Tobacco Use  . Smoking status: Current Every Day Smoker    Packs/day: 1.00    Years: 40.00    Pack years: 40.00    Last attempt to quit: 07/01/2015    Years since quitting: 3.3  . Smokeless tobacco: Never Used  Substance Use Topics  . Alcohol use: Yes    Alcohol/week: 0.0  standard drinks    Comment: RARE  . Drug use: No    Family History Family History  Problem Relation Age of Onset  . Diabetes Mother   . Heart disease Mother        myocardial infarction  . Glaucoma Mother   . Graves' disease Mother   . Heart attack Mother 81  . Lung disease Father   . Heart disease Unknown        half sister  . Peripheral vascular disease Unknown        half sister  . Breast cancer Neg Hx     Allergies  Allergen Reactions  . Statins Other (See Comments)    Muscle weakness/pain. Trials of multiple agents. Muscle weakness/pain. Trials of multiple  agents. Muscle weakness/pain. Trials of multiple agents.  . Clindamycin/Lincomycin Other (See Comments)    Digestive upset  . Codeine Other (See Comments)    "hallucinations"  . Shellfish Allergy Nausea And Vomiting    Felt goofy headed  . Valium [Diazepam] Anxiety and Other (See Comments)    hyperactivity     REVIEW OF SYSTEMS (Negative unless checked)  Constitutional: [] Weight loss  [] Fever  [] Chills Cardiac: [] Chest pain   [] Chest pressure   [] Palpitations   [] Shortness of breath when laying flat   [] Shortness of breath with exertion. Vascular:  [] Pain in legs with walking   [] Pain in legs at rest  [] History of DVT   [] Phlebitis   [] Swelling in legs   [] Varicose veins   [] Non-healing ulcers Pulmonary:   [] Uses home oxygen   [] Productive cough   [] Hemoptysis   [] Wheeze  [] COPD   [] Asthma Neurologic:  [] Dizziness   [] Seizures   [] History of stroke   [] History of TIA  [] Aphasia   [] Vissual changes   [] Weakness or numbness in arm   [] Weakness or numbness in leg Musculoskeletal:   [] Joint swelling   [] Joint pain   [] Low back pain Hematologic:  [] Easy bruising  [] Easy bleeding   [] Hypercoagulable state   [] Anemic Gastrointestinal:  [] Diarrhea   [] Vomiting  [] Gastroesophageal reflux/heartburn   [] Difficulty swallowing. Genitourinary:  [] Chronic kidney disease   [] Difficult urination  [] Frequent urination   [] Blood in urine Skin:  [] Rashes   [] Ulcers  Psychological:  [] History of anxiety   []  History of major depression.  Physical Examination  There were no vitals filed for this visit. There is no height or weight on file to calculate BMI. Gen: WD/WN, NAD Head: [place]/AT, No temporalis wasting.  Ear/Nose/Throat: Hearing grossly intact, nares w/o erythema or drainage Eyes: PER, EOMI, sclera nonicteric.  Neck: Supple, no large masses.   Pulmonary:  Good air movement, no audible wheezing bilaterally, no use of accessory muscles.  Cardiac: RRR, no JVD Vascular: bilateral carotid bruits  Vessel Right Left  Radial Palpable Palpable  Carotid Palpable Palpable  Gastrointestinal: Non-distended. No guarding/no peritoneal signs.  Musculoskeletal: M/S 5/5 throughout.  No deformity or atrophy.  Neurologic: CN 2-12 intact. Symmetrical.  Speech is fluent. Motor exam as listed above. Psychiatric: Judgment intact, Mood & affect appropriate for pt's clinical situation. Dermatologic: No rashes or ulcers noted.  No changes consistent with cellulitis. Lymph : No lichenification or skin changes of chronic lymphedema.  CBC Lab Results  Component Value Date   WBC 6.7 04/09/2018   HGB 12.4 04/09/2018   HCT 37.2 04/09/2018   MCV 79.8 04/09/2018   PLT 207.0 04/09/2018    BMET    Component Value Date/Time   NA 138 04/09/2018 1051  NA 134 (A) 07/08/2014   NA 139 05/01/2014 1008   K 3.9 04/09/2018 1051   K 4.0 05/01/2014 1008   CL 103 04/09/2018 1051   CL 102 05/01/2014 1008   CO2 28 04/09/2018 1051   CO2 30 05/01/2014 1008   GLUCOSE 130 (H) 04/09/2018 1051   GLUCOSE 107 (H) 05/01/2014 1008   BUN 8 04/09/2018 1051   BUN 13 07/08/2014   BUN 13 05/01/2014 1008   CREATININE 0.82 04/09/2018 1051   CREATININE 0.91 05/01/2014 1008   CALCIUM 9.1 04/09/2018 1051   CALCIUM 9.1 05/01/2014 1008   GFRNONAA 56 (L) 07/04/2015 1445   GFRNONAA >60 05/01/2014 1008   GFRNONAA 57 (L) 01/29/2014 1021   GFRAA >60 07/04/2015 1445   GFRAA >60 05/01/2014 1008   GFRAA >60 01/29/2014 1021   CrCl cannot be calculated (Patient's most recent lab result is older than the maximum 21 days allowed.).  COAG No results found for: INR, PROTIME  Radiology No results found.   Assessment/Plan 1. Bilateral carotid artery stenosis Recommend:  Given the patient's asymptomatic subcritical stenosis no further invasive testing or surgery at this time.  Duplex ultrasound shows 40-59% stenosis bilaterally.  Continue antiplatelet therapy as prescribed Continue management of CAD, HTN and Hyperlipidemia  Healthy heart diet,  encouraged exercise at least 4 times per week Follow up in 12 months with duplex ultrasound and physical exam   - VAS US CAROTID; Future  2. Chronic venous insufficiency No surgery or intervention at this point in time.    I have reviewed my discussion with the patient regarding venous insufficiency and secondary lymph edema and why it  causes symptoms. I have discussed with the patient the chronic skin changes that accompany these problems and the long term sequela such as ulceration and infection.  Patient will continue wearing graduated compression stockings class 1 (20-30 mmHg) on a daily basis a prescription was given to the patient to keep this updated. The patient will  put the stockings on first thing in the morning and removing them in the evening. The patient is instructed specifically not to sleep in the stockings.  In addition, behavioral modification including elevation during the day will be continued.  Diet and salt restriction was also discussed.  Previous duplex ultrasound of the lower extremities shows normal deep venous system, superficial reflux was not present.   Following the review of the ultrasound the patient will follow up in 12 months to reassess the degree of swelling and the control that graduated compression is offering.   The patient can be assessed for a Lymph Pump at that time.  However, at this time the patient states they are satisfied with the control compression and elevation is yielding.    3. Lymphedema No surgery or intervention at this point in time.    I have reviewed my discussion with the patient regarding venous insufficiency and secondary lymph edema and why it  causes symptoms. I have discussed with the patient the chronic skin changes that accompany these problems and the long term sequela such as ulceration and infection.  Patient will continue wearing graduated compression stockings class 1 (20-30 mmHg) on a daily basis a  prescription was given to the patient to keep this updated. The patient will  put the stockings on first thing in the morning and removing them in the evening. The patient is instructed specifically not to sleep in the stockings.  In addition, behavioral modification including elevation during the day will be  continued.  Diet and salt restriction was also discussed.  Previous duplex ultrasound of the lower extremities shows normal deep venous system, superficial reflux was not present.   Following the review of the ultrasound the patient will follow up in 12 months to reassess the degree of swelling and the control that graduated compression is offering.   The patient can be assessed for a Lymph Pump at that time.  However, at this time the patient states they are satisfied with the control compression and elevation is yielding.    4. Hypercholesterolemia Continue statin as ordered and reviewed, no changes at this time   5. Coronary artery disease of native artery of native heart with stable angina pectoris (HCC) Continue cardiac and antihypertensive medications as already ordered and reviewed, no changes at this time.  Continue statin as ordered and reviewed, no changes at this time  Nitrates PRN for chest pain     Hortencia Pilar, MD  10/25/2018 1:03 PM

## 2018-10-31 ENCOUNTER — Other Ambulatory Visit: Payer: Self-pay | Admitting: Internal Medicine

## 2018-11-07 ENCOUNTER — Other Ambulatory Visit: Payer: Self-pay

## 2018-11-07 ENCOUNTER — Encounter: Payer: Self-pay | Admitting: Family Medicine

## 2018-11-07 ENCOUNTER — Ambulatory Visit (INDEPENDENT_AMBULATORY_CARE_PROVIDER_SITE_OTHER): Payer: Medicare Other | Admitting: Family Medicine

## 2018-11-07 VITALS — BP 118/64 | HR 72 | Temp 98.5°F | Resp 16 | Ht 66.0 in | Wt 208.2 lb

## 2018-11-07 DIAGNOSIS — K047 Periapical abscess without sinus: Secondary | ICD-10-CM | POA: Diagnosis not present

## 2018-11-07 MED ORDER — AMOXICILLIN 500 MG PO CAPS: 500.0000 mg | ORAL_CAPSULE | Freq: Three times a day (TID) | ORAL | 0 refills | Status: DC

## 2018-11-07 NOTE — Progress Notes (Signed)
Subjective:    Patient ID: Haley Santos, female    DOB: [birth year], 73 y.o.   MRN: [id number]  HPI  Patient presents to clinic due to suspected dental abscess in upper front teeth.  Patient states she has known she most likely needs to get these teeth pulled for the past couple of years, but has put it off due to other medical issues.  She has been trying to combat the abscess on her own by doing warm salt water mouth washes.  Upper lip became swollen and tender few days ago, that is when she knew she most likely needed antibiotic.  Has no fever or chills.  Patient Active Problem List   Diagnosis Date Noted  . Lymphedema 09/11/2017  . Chronic venous insufficiency 09/11/2017  . Anemia 03/12/2017  . Health care maintenance 11/11/2016  . Cough 07/01/2016  . 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[place]) 09/15/2015  . Sinusitis, acute frontal 06/30/2015  . Left arm pain 05/08/2015  . Pain of left calf 05/08/2015  . Diabetes mellitus ([place]) 05/08/2015  . Carotid artery stenosis 09/19/2014  . CAD (coronary artery disease) 09/19/2014  . GERD (gastroesophageal reflux disease)   . Crohn's disease ([place])   . ST elevation myocardial infarction (STEMI) of inferior wall ([place]) 09/02/2014  . Tobacco abuse 09/02/2014  . Carotid bruit 03/31/2014  . Joint pain 10/06/2013  . Sinus tachycardia 12/21/2012  . History of breast cancer 10/03/2012  . Breast nodule 09/20/2012  . Hypertension 06/16/2012  . Hypercholesterolemia 06/16/2012  . Asthma 06/16/2012  . Multiple sclerosis ([place]) 06/16/2012  . Psoriasis 06/16/2012  . Hypothyroidism 06/16/2012  . Glaucoma 06/16/2012   Social History   Tobacco Use  . Smoking status: Current Every Day Smoker    Packs/day: 1.00    Years: 40.00    Pack years: 40.00    Last attempt to quit: 07/01/2015    Years since quitting: 3.3  . Smokeless tobacco: Never Used  Substance Use Topics  . Alcohol use: Yes    Alcohol/week: 0.0  standard drinks    Comment: RARE   Review of Systems  Constitutional: Negative for chills, fatigue and fever.  HENT: Negative for congestion, ear pain, sinus pain and sore throat. +dental abscess Eyes: Negative.   Respiratory: Negative for cough, shortness of breath and wheezing.   Cardiovascular: Negative for chest pain, palpitations and leg swelling.  Gastrointestinal: Negative for abdominal pain, diarrhea, nausea and vomiting.  Genitourinary: Negative for dysuria, frequency and urgency.  Musculoskeletal: Negative for arthralgias and myalgias.  Skin: Negative for color change, pallor and rash.  Neurological: Negative for syncope, light-headedness and headaches.  Psychiatric/Behavioral: The patient is not nervous/anxious.        Objective:   Physical Exam Vitals signs and nursing note reviewed.  Constitutional:      General: She is not in acute distress.    Appearance: She is not toxic-appearing.  HENT:     Head: Normocephalic and atraumatic.     Mouth/Throat:      Comments: Upper front teeth appear decayed/brown in color. Tenderness of upper jaw indicated by red circle above lip.  Area is somewhat swollen and is painful to patient when touched. Generally poor dentition.  Eyes:     General: No scleral icterus.    Extraocular Movements: Extraocular movements intact.     Pupils: Pupils are equal, round, and reactive to light.  Cardiovascular:     Rate and Rhythm: Normal rate and regular rhythm.  Heart sounds: Normal heart sounds.  Pulmonary:     Effort: Pulmonary effort is normal.     Breath sounds: Normal breath sounds.  Skin:    General: Skin is warm and dry.     Coloration: Skin is not jaundiced or pale.     Findings: No erythema.  Neurological:     Mental Status: She is alert and oriented to person, place, and time.     Gait: Gait normal.  Psychiatric:        Mood and Affect: Mood normal.        Behavior: Behavior normal.    Vitals:   11/07/18 1004  BP:  118/64  Pulse: 72  Resp: 16  Temp: 98.5 F (36.9 C)  SpO2: 96%       Assessment & Plan:    Dental abscess- patient will take amoxicillin 3 times daily for 10 days.  Advised to mix 1 part hydrogen peroxide 1 part water, swish around in mouth at least 1-2 times a day and spit out to help pull out any sort of oral bacteria.  Patient given name of general dentist in the area she can contact and also states her family members have an oral surgeon in [place] would like her to see.  Patient advised she must see a dentist and/or oral surgeon very soon for evaluation and further care.    Patient will keep regular scheduled follow-up with PCP as planned.  She will return to clinic sooner if any issues arise.

## 2018-11-07 NOTE — Patient Instructions (Signed)
Dr Adair Laundry, [place] in [place] --- General Dentist   Dental Abscess  A dental abscess is an area of pus in or around a tooth. It comes from an infection. It can cause pain and other symptoms. Treatment will help with symptoms and prevent the infection from spreading. Follow these instructions at home: Medicines  Take over-the-counter and prescription medicines only as told by your dentist.  If you were prescribed an antibiotic medicine, take it as told by your dentist. Do not stop taking it even if you start to feel better.  If you were prescribed a gel that has numbing medicine in it, use it exactly as told.  Do not drive or use heavy machinery (like a Conservation officer, nature) while taking prescription pain medicine. General instructions  Rinse out your mouth often with salt water. ? To make salt water, dissolve -1 tsp of salt in 1 cup of warm water.  Eat a soft diet while your mouth is healing.  Drink enough fluid to keep your urine pale yellow.  Do not apply heat to the outside of your mouth.  Do not use any products that contain nicotine or tobacco. These include cigarettes and e-cigarettes. If you need help quitting, ask your doctor.  Keep all follow-up visits as told by your dentist. This is important. Prevent an abscess  Brush your teeth every morning and every night. Use fluoride toothpaste.  Floss your teeth each day.  Get dental cleanings as often as told by your dentist.  Think about getting dental sealant put on teeth that have deep holes (decay).  Drink water that has fluoride in it. ? Most tap water has fluoride. ? Check the label on bottled water to see if it has fluoride in it.  Drink water instead of sugary drinks.  Eat healthy meals and snacks.  Wear a mouth guard or face shield when you play sports. Contact a doctor if:  Your pain is worse, and medicine does not help. Get help right away if:  You have a fever or chills.  Your symptoms suddenly  get worse.  You have a very bad headache.  You have problems breathing or swallowing.  You have trouble opening your mouth.  You have swelling in your neck or close to your eye. Summary  A dental abscess is an area of pus in or around a tooth. It is caused by an infection.  Treatment will help with symptoms and prevent the infection from spreading.  Take over-the-counter and prescription medicines only as told by your dentist.  To prevent an abscess, take good care of your teeth. Brush your teeth every morning and night. Use floss every day.  Get dental cleanings as often as told by your dentist. This information is not intended to replace advice given to you by your health care provider. Make sure you discuss any questions you have with your health care provider. Document Released: 09/16/2014 Document Revised: 01/02/2017 Document Reviewed: 01/02/2017 Elsevier Interactive Patient Education  2019 [employer].

## 2019-01-09 ENCOUNTER — Other Ambulatory Visit: Payer: Self-pay | Admitting: Internal Medicine

## 2019-01-09 DIAGNOSIS — Z1239 Encounter for other screening for malignant neoplasm of breast: Secondary | ICD-10-CM

## 2019-01-09 DIAGNOSIS — Z1231 Encounter for screening mammogram for malignant neoplasm of breast: Secondary | ICD-10-CM

## 2019-01-13 ENCOUNTER — Other Ambulatory Visit: Payer: Self-pay | Admitting: Internal Medicine

## 2019-01-25 DIAGNOSIS — G8929 Other chronic pain: Secondary | ICD-10-CM | POA: Diagnosis not present

## 2019-01-25 DIAGNOSIS — M25512 Pain in left shoulder: Secondary | ICD-10-CM | POA: Diagnosis not present

## 2019-01-25 DIAGNOSIS — M7582 Other shoulder lesions, left shoulder: Secondary | ICD-10-CM | POA: Diagnosis not present

## 2019-02-12 ENCOUNTER — Other Ambulatory Visit: Payer: Self-pay | Admitting: Cardiovascular Disease

## 2019-02-13 ENCOUNTER — Ambulatory Visit
Admission: RE | Admit: 2019-02-13 | Discharge: 2019-02-13 | Disposition: A | Discharge status: Home / Self Care | Payer: Medicare Other | Source: Ambulatory Visit | Attending: Internal Medicine | Admitting: Internal Medicine

## 2019-02-13 DIAGNOSIS — Z1231 Encounter for screening mammogram for malignant neoplasm of breast: Secondary | ICD-10-CM | POA: Diagnosis not present

## 2019-04-05 ENCOUNTER — Other Ambulatory Visit: Payer: Self-pay | Admitting: Internal Medicine

## 2019-04-09 NOTE — Progress Notes (Deleted)
Cardiology Office Note  Date:  04/09/2019   ID:  Haley Santos, DOB [birth year], MRN [id number]  PCP:  Haley Pheasant, MD   No chief complaint on file.   HPI:  Haley Santos is a 73 yo woman with  obesity,  long history of smoking who continues to smoke,  Hyperlipidemia, intolerant of statins,  GERD,   hypertension   moderate carotid arterial disease on the left, 50% STEMI in 2016 with stent placed to mid RCA,  Patient reports history of Crohn's Diabetes type 2 with hemoglobin A1c 7.4 who presents for routine follow-up of her coronary artery disease, hyperlipidemia  In follow-up today she feels that she is "falling apart" Statin getting to me Total chol 143, numbers have never been better "hurt all the time"  Chronic joint pain , joint pain, 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  Still smoking Fearful about exercise, not know how much to do She did complete cardiac rehab has done nothing since then Does not want to go to [facility]  Blood pressure stable, no chest pain concerning for angina Denies having significant shortness of breath Episodes of tachycardia, this has not been an issue  Other lab work reviewed with her in detail HBA1C 7.5, was 6.5 two years ago Total cholesterol 143 LDL 67  EKG personally reviewed by myself on todays visit Shows NSR   Rate 72 bpm, no significant ST or T wave changes  Past medical history reviewed April 2016 she developed acute chest pain, bilateral arm pain, neck pain/jaw pain bilaterally.  She had cardiac catheterization, essentially occluded mid RCA with 50-70% mid LAD disease, DES placed the RCA. History of breast cancer, Adriamycin through port in the left chest Carotid ultrasound with 50-69% carotid stenosis  She has seen [facility] in the past and unable to tolerate statin secondary to myalgias. She reports a history of MS  Review of her carotid ultrasound with her shows 50-69% disease on the left Review of her lab work with her shows  total cholesterol 260. Elevated hemoglobin A1c greater than 6   PMH:   has a past medical history of Asthma with bronchitis, Breast cancer ([place]) (2014), CAD (coronary artery disease), COPD (chronic obstructive pulmonary disease) ([place]), Crohn's disease ([place]), Dyspnea, GERD (gastroesophageal reflux disease), Glaucoma, Glucose intolerance (impaired glucose tolerance), Hypercholesterolemia, Hypertension, Hyperthyroidism, Hypothyroidism, Multiple sclerosis ([place]), Myocardial infarction [facility]), Neuromuscular disorder ([place]), and Tobacco abuse.  PSH:    Past Surgical History:  Procedure Laterality Date  . AUGMENTATION MAMMAPLASTY Left   . [place]  . CATARACT EXTRACTION W/PHACO Left 07/19/2017   Procedure: CATARACT EXTRACTION PHACO AND INTRAOCULAR LENS PLACEMENT (IOC);  Surgeon: Birder Robson, MD;  Location: [facility] ORS;  Service: Ophthalmology;  Laterality: Left;  [place] 00:49.0 AP% 14.5 CDE 7.10 Fluid Pack Lot # [id number]  . CATARACT EXTRACTION W/PHACO Right 08/09/2017   Procedure: CATARACT EXTRACTION PHACO AND INTRAOCULAR LENS PLACEMENT ([place]);  Surgeon: Birder Robson, MD;  Location: [facility] ORS;  Service: Ophthalmology;  Laterality: Right;  [place] 00:42 AP% 13.8 CDE 5.88 Fluid pack lot [id number]  . COLONOSCOPY  2010   Dr Vira Agar  . CORONARY ANGIOPLASTY     STENT 2016  . LEFT HEART CATHETERIZATION WITH CORONARY ANGIOGRAM N/A 09/01/2014   Procedure: LEFT HEART CATHETERIZATION WITH CORONARY ANGIOGRAM;  Surgeon: Peter M Martinique, MD;  Location: [facility] CATH LAB;  Service: Cardiovascular;  Laterality: N/A;  . MASTECTOMY Right 2014   PATRICAL MASTECOMY RIGHT RAD CHEMO  . PORTACATH PLACEMENT  11/07/2012  . RIGHT OOPHORECTOMY  1979  . TONSILLECTOMY AND ADENOIDECTOMY  1957    Current Outpatient Medications  Medication Sig Dispense Refill  . amoxicillin (AMOXIL) 500 MG capsule Take 1 capsule (500 mg total) by mouth 3 (three) times daily. 30 capsule 0  . aspirin 81 MG  chewable tablet Chew 1 tablet (81 mg total) by mouth daily. (Patient taking differently: Chew 81 mg by mouth at bedtime. )    . clopidogrel (PLAVIX) 75 MG tablet TAKE 1 TABLET BY MOUTH ONCE DAILY 90 tablet 3  . ezetimibe (ZETIA) 10 MG tablet Take 1 tablet by mouth once daily 90 tablet 3  . Fluticasone-Salmeterol (ADVAIR) 250-50 MCG/DOSE AEPB INHALE 1 DOSE BY MOUTH TWICE DAILY 180 each 0  . latanoprost (XALATAN) 0.005 % ophthalmic solution Place 1 drop into both eyes at bedtime.     Marland Kitchen levothyroxine (SYNTHROID) 112 MCG tablet Take 1 tablet by mouth once daily 90 tablet 1  . metoprolol tartrate (LOPRESSOR) 25 MG tablet Take 1/2 (one-half) tablet by mouth twice daily 90 tablet 3  . omeprazole (PRILOSEC OTC) 20 MG tablet Take 20 mg by mouth daily.    . rosuvastatin (CRESTOR) 10 MG tablet TAKE 1 TABLET BY MOUTH AT BEDTIME (NEEDS  APPOINTMENT  FOR  FURTHER  REFILLS) 30 tablet 0  . VENTOLIN HFA 108 (90 Base) MCG/ACT inhaler INHALE 2 PUFFS BY MOUTH EVERY 6 HOURS AS NEEDED FOR WHEEZING FOR SHORTNESS OF BREATH 54 g 0   No current facility-administered medications for this visit.      Allergies:   Statins, Clindamycin/lincomycin, Codeine, Shellfish allergy, and Valium [diazepam]   Social History:  The patient  reports that she has been smoking. She has a 40.00 pack-year smoking history. She has never used smokeless tobacco. She reports current alcohol use. She reports that she does not use drugs.   Family History:   family history includes Diabetes in her mother; Glaucoma in her mother; Berenice Primas' disease in her mother; Heart attack (age of onset: 7) in her mother; Heart disease in her mother and unknown relative; Lung disease in her father; Peripheral vascular disease in her unknown relative.    Review of Systems: Review of Systems  Constitutional: Negative.   Respiratory: Negative.   Cardiovascular: Negative.        Tachycardia  Gastrointestinal: Negative.   Musculoskeletal: Positive for joint pain.   Neurological: Negative.   Psychiatric/Behavioral: Negative.   All other systems reviewed and are negative.    PHYSICAL EXAM: VS:  LMP 06/15/1977  , BMI There is no height or weight on file to calculate BMI. Constitutional:  oriented to person, place, and time. No distress.  HENT:  Head: Normocephalic and atraumatic.  Eyes:  no discharge. No scleral icterus.  Neck: Normal range of motion. Neck supple. No JVD present.  Cardiovascular: Normal rate, regular rhythm, normal heart sounds and intact distal pulses. Exam reveals no gallop and no friction rub. No edema No murmur heard. Pulmonary/Chest: Effort normal and breath sounds normal. No stridor. No respiratory distress.  no wheezes.  no rales.  no tenderness.  Abdominal: Soft.  no distension.  no tenderness.  Musculoskeletal: Normal range of motion.  no  tenderness or deformity.  Neurological:  normal muscle tone. Coordination normal. No atrophy Skin: Skin is warm and dry. No rash noted. not diaphoretic.  Psychiatric:  normal mood and affect. behavior is normal. Thought content normal.   Recent Labs: 04/09/2018: ALT 17; BUN 8; Creatinine, Ser 0.82; Hemoglobin 12.4; Platelets  207.0; Potassium 3.9; Sodium 138; TSH 2.97    Lipid Panel Lab Results  Component Value Date   CHOL 130 04/09/2018   HDL 35.00 (L) 04/09/2018   LDLCALC 66 04/09/2018   TRIG 144.0 04/09/2018      Wt Readings from Last 3 Encounters:  11/07/18 208 lb 3.2 oz (94.4 kg)  10/25/18 211 lb (95.7 kg)  04/09/18 216 lb 6.4 oz (98.2 kg)     ASSESSMENT AND PLAN:  Tachycardia Continue metoprolol, not an active issue on today's visit  Essential hypertension - Plan: EKG 12-Lead Blood pressure is well controlled on today's visit. No changes made to the medications. Stable  Hypercholesterolemia - Plan: EKG 12-Lead If possible recommend she continue on her current regiment,  Numbers have never been better and now at goal Recommended weight loss  ST elevation  myocardial infarction (STEMI) of inferior wall ([place]) - Plan: EKG 12-Lead Mental issue following her STEMI, difficulty getting going, fearful Reassurance provided Recommend she start a regular exercise program for conditioning and weight loss  Coronary artery disease involving native coronary artery of native heart without angina pectoris -  Currently with no symptoms of angina. No further workup at this time. Continue current medication regimen.  Stable  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[place]) - Plan: EKG 12-Lead Discussed dietary changes Needs to get exercising  Tobacco abuse  she continues to smoke daily She does not want assistance, discussed various strategies to quit smoking Does not seem ready to quit.  This was again discussed with her  Stenosis of left carotid artery Significant stenosis, recommended smoking cessation, aggressive lipid management Cholesterol at goal   Total encounter time more than 25 minutes  Greater than 50% was spent in counseling and coordination of care with the patient   Disposition:   F/U  6 months   No orders of the defined types were placed in this encounter.    Signed, Esmond Plants, M.D., Ph.D. 04/09/2019  [place], [place]

## 2019-04-10 ENCOUNTER — Other Ambulatory Visit: Payer: Self-pay

## 2019-04-10 ENCOUNTER — Ambulatory Visit: Payer: Medicare Other | Admitting: Cardiovascular Disease

## 2019-04-10 MED ORDER — ROSUVASTATIN CALCIUM 10 MG PO TABS: ORAL_TABLET | ORAL | 2 refills | Status: DC

## 2019-04-10 MED ORDER — CLOPIDOGREL BISULFATE 75 MG PO TABS: 75.0000 mg | ORAL_TABLET | Freq: Every day | ORAL | 0 refills | Status: DC

## 2019-04-10 NOTE — Telephone Encounter (Signed)
*  STAT* If patient is at the pharmacy, call can be transferred to refill team.   1. Which medications need to be refilled? (please list name of each medication and dose if known) Plavix, Crestor  2. Which pharmacy/location (including street and city if local pharmacy) is medication to be sent to? [place]  3. Do they need a 30 day or 90 day supply? [place]

## 2019-04-15 DIAGNOSIS — H401132 Primary open-angle glaucoma, bilateral, moderate stage: Secondary | ICD-10-CM | POA: Diagnosis not present

## 2019-05-04 ENCOUNTER — Other Ambulatory Visit: Payer: Self-pay | Admitting: Internal Medicine

## 2019-05-07 ENCOUNTER — Telehealth: Payer: Self-pay | Admitting: Internal Medicine

## 2019-05-07 NOTE — Telephone Encounter (Signed)
I called [facility] & gave verbal for them to change manufacturers on levothyroxine. They will get filled for patient.

## 2019-05-07 NOTE — Telephone Encounter (Signed)
Pt's pharmacy, [facility], on [address]. has to change the manufacture on her  levothyroxine (SYNTHROID) 112 MCG tablet. They are waiting for Dr. Nicki Reaper to ok. Patient is out of her medication for the past 3 days.

## 2019-05-21 ENCOUNTER — Ambulatory Visit: Payer: Medicare Other | Admitting: Cardiovascular Disease

## 2019-05-24 NOTE — Progress Notes (Signed)
Cardiology Office Note  Date:  05/27/2019   ID:  Haley Santos, DOB 1945/05/24, MRN [id number]  PCP:  Einar Pheasant, MD   Chief Complaint  Patient presents with  . office visit    Pt 12 month f/u Painful muscle cramps bilateral legs PC. Meds verbally reviewed w/ pt.    HPI:  Haley Santos is a 74 yo woman with  obesity,  long history of smoking who continues to smoke,  Hyperlipidemia, intolerant of statins,  GERD,   hypertension   moderate carotid arterial disease on the left, 50% STEMI in 2016 with stent placed to mid RCA,  Patient reports history of Crohn's Diabetes type 2 with hemoglobin A1c 7.4 who presents for routine follow-up of her coronary artery disease, hyperlipidemia  No recent labs HBA1C 7.4 Total chol 130, LDL 66   Sedentary, does not go out of the house much No regular exercise program Goes to [employer]  Muscle cramps, daily Pulls foot inward, Does not want to change crestor Not interested in injectable medication  Chronic joint pain , joint pain, 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  She had cardiac catheterization, essentially occluded mid RCA with 50-70% mid LAD disease, DES placed the RCA.   History of breast cancer, Adriamycin through port in the left chest Carotid ultrasound with 50-69% carotid stenosis  She has seen [facility] in the past and unable to tolerate statin secondary to myalgias. She reports a history of MS  Review of her carotid ultrasound with her shows 50-69% disease on the left Review of her lab work with her shows total cholesterol 260. Elevated hemoglobin A1c greater than 6   PMH:   has a past medical history of Asthma with bronchitis, Breast cancer ([place]) (2014), CAD (coronary artery  disease), COPD (chronic obstructive pulmonary disease) ([place]), Crohn's disease ([place]), Dyspnea, GERD (gastroesophageal reflux disease), Glaucoma, Glucose intolerance (impaired glucose tolerance), Hypercholesterolemia, Hypertension, Hyperthyroidism, Hypothyroidism, Multiple sclerosis ([place]), Myocardial infarction [facility]), Neuromuscular disorder ([place]), and Tobacco abuse.  PSH:    Past Surgical History:  Procedure Laterality Date  . AUGMENTATION MAMMAPLASTY Left   . [place]  . CATARACT EXTRACTION W/PHACO Left 07/19/2017   Procedure: CATARACT EXTRACTION PHACO AND INTRAOCULAR LENS PLACEMENT (IOC);  Surgeon: Birder Robson, MD;  Location: [facility] ORS;  Service: Ophthalmology;  Laterality: Left;  [place] 00:49.0 AP% 14.5 CDE 7.10 Fluid Pack Lot # [id number]  . CATARACT EXTRACTION W/PHACO Right 08/09/2017   Procedure: CATARACT EXTRACTION PHACO AND INTRAOCULAR LENS PLACEMENT ([place]);  Surgeon: Birder Robson, MD;  Location: [facility] ORS;  Service: Ophthalmology;  Laterality: Right;  [place] 00:42 AP% 13.8 CDE 5.88 Fluid pack lot [id number]  . COLONOSCOPY  2010   Dr Vira Agar  . CORONARY ANGIOPLASTY     STENT 2016  . LEFT HEART CATHETERIZATION WITH CORONARY ANGIOGRAM N/A 09/01/2014   Procedure: LEFT HEART CATHETERIZATION WITH CORONARY ANGIOGRAM;  Surgeon: Peter M Martinique, MD;  Location: [facility] CATH LAB;  Service: Cardiovascular;  Laterality: N/A;  . MASTECTOMY Right 2014   PATRICAL MASTECOMY RIGHT RAD CHEMO  . PORTACATH PLACEMENT  11/07/2012  . RIGHT OOPHORECTOMY  1979  . TONSILLECTOMY AND ADENOIDECTOMY  1957    Current Outpatient Medications  Medication Sig Dispense Refill  . albuterol (  VENTOLIN HFA) 108 (90 Base) MCG/ACT inhaler Inhale into the lungs as needed for wheezing or shortness of breath.    . clopidogrel (PLAVIX) 75 MG tablet Take 1 tablet (75 mg total) by mouth daily. 90 tablet 0  . ezetimibe (ZETIA) 10 MG tablet Take 1 tablet by mouth once daily 90 tablet 3  .  Fluticasone-Salmeterol (ADVAIR) 250-50 MCG/DOSE AEPB INHALE 1 DOSE BY MOUTH TWICE DAILY 180 each 0  . latanoprost (XALATAN) 0.005 % ophthalmic solution Place 1 drop into both eyes at bedtime.     Marland Kitchen levothyroxine (SYNTHROID) 112 MCG tablet Take 1 tablet by mouth once daily 90 tablet 0  . metoprolol tartrate (LOPRESSOR) 25 MG tablet Take 1/2 (one-half) tablet by mouth twice daily 90 tablet 3  . omeprazole (PRILOSEC OTC) 20 MG tablet Take 20 mg by mouth daily.    . rosuvastatin (CRESTOR) 10 MG tablet TAKE 1 TABLET BY MOUTH AT BEDTIME 30 tablet 2  . VENTOLIN HFA 108 (90 Base) MCG/ACT inhaler INHALE 2 PUFFS BY MOUTH EVERY 6 HOURS AS NEEDED FOR WHEEZING FOR SHORTNESS OF BREATH 54 g 0   No current facility-administered medications for this visit.     Allergies:   Statins, Clindamycin/lincomycin, Codeine, Shellfish allergy, and Valium [diazepam]   Social History:  The patient  reports that she has been smoking. She has a 40.00 pack-year smoking history. She has never used smokeless tobacco. She reports current alcohol use. She reports that she does not use drugs.   Family History:   family history includes Diabetes in her mother; Glaucoma in her mother; Berenice Primas' disease in her mother; Heart attack (age of onset: 57) in her mother; Heart disease in her mother and another family member; Lung disease in her father; Peripheral vascular disease in an other family member.    Review of Systems: Review of Systems  Constitutional: Negative.   HENT: Negative.   Respiratory: Positive for shortness of breath.   Cardiovascular: Negative.   Gastrointestinal: Negative.   Musculoskeletal: Positive for back pain and joint pain.  Neurological: Positive for speech change.  Psychiatric/Behavioral: Negative.   All other systems reviewed and are negative.   PHYSICAL EXAM: VS:  BP 132/64 (BP Location: Left Arm, Patient Position: Sitting, Cuff Size: Normal)   Pulse 83   Ht 5\' 6"  (1.676 m)   Wt 203 lb 8 oz (92.3  kg)   LMP 06/15/1977   SpO2 98%   BMI 32.85 kg/m  , BMI Body mass index is 32.85 kg/m.  Constitutional:  oriented to person, place, and time. No distress.  HENT:  Head: Grossly normal Eyes:  no discharge. No scleral icterus.  Neck: No JVD, no carotid bruits  Cardiovascular: Regular rate and rhythm, no murmurs appreciated Pulmonary/Chest: Clear to auscultation bilaterally, no wheezes or rails Abdominal: Soft.  no distension.  no tenderness.  Musculoskeletal: Normal range of motion Neurological:  normal muscle tone. Coordination normal. No atrophy Skin: Skin warm and dry Psychiatric: normal affect, pleasant   Recent Labs: No results found for requested labs within last 8760 hours.    Lipid Panel Lab Results  Component Value Date   CHOL 130 04/09/2018   HDL 35.00 (L) 04/09/2018   LDLCALC 66 04/09/2018   TRIG 144.0 04/09/2018      Wt Readings from Last 3 Encounters:  05/27/19 203 lb 8 oz (92.3 kg)  11/07/18 208 lb 3.2 oz (94.4 kg)  10/25/18 211 lb (95.7 kg)     ASSESSMENT AND PLAN:  Tachycardia Stable on  metoprolol No medication changes made  Essential hypertension - Plan: EKG 12-Lead Blood pressure is well controlled on today's visit. No changes made to the medications. 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 No further workup at this time. Continue current medication regimen.  Coronary artery disease involving native coronary artery of native heart without angina pectoris -  Currently with no symptoms of angina. No further workup at this time. Continue current medication regimen.  Stable  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[place]) -  We have encouraged continued exercise, careful  diet management in an effort to lose weight.  Tobacco abuse  she continues to smoke daily We have encouraged her to continue to work on weaning her cigarettes and smoking cessation. She will continue to work on this and does not want any assistance with chantix.   Stenosis of left carotid artery Significant stenosis, recommended smoking cessation, aggressive lipid management Cholesterol at goal   Total encounter time more than 25 minutes  Greater than 50% was spent in counseling and coordination of care with the patient   Disposition:   F/U  12 months   No orders of the defined types were placed in this encounter.    Signed, Esmond Plants, M.D., Ph.D. 05/27/2019  [place], [place]

## 2019-05-27 ENCOUNTER — Other Ambulatory Visit: Payer: Self-pay

## 2019-05-27 ENCOUNTER — Ambulatory Visit (INDEPENDENT_AMBULATORY_CARE_PROVIDER_SITE_OTHER): Payer: Medicare Other | Admitting: Cardiovascular Disease

## 2019-05-27 ENCOUNTER — Encounter: Payer: Self-pay | Admitting: Cardiovascular Disease

## 2019-05-27 VITALS — BP 132/64 | HR 83 | Ht 66.0 in | Wt 203.5 lb

## 2019-05-27 DIAGNOSIS — E1159 Type 2 diabetes mellitus with other circulatory complications: Secondary | ICD-10-CM

## 2019-05-27 DIAGNOSIS — G35 Multiple sclerosis: Secondary | ICD-10-CM | POA: Diagnosis not present

## 2019-05-27 DIAGNOSIS — I6522 Occlusion and stenosis of left carotid artery: Secondary | ICD-10-CM | POA: Diagnosis not present

## 2019-05-27 DIAGNOSIS — I25118 Atherosclerotic heart disease of native coronary artery with other forms of angina pectoris: Secondary | ICD-10-CM

## 2019-05-27 DIAGNOSIS — R000 Tachycardia, unspecified: Secondary | ICD-10-CM | POA: Diagnosis not present

## 2019-05-27 DIAGNOSIS — I872 Venous insufficiency (chronic) (peripheral): Secondary | ICD-10-CM

## 2019-05-27 DIAGNOSIS — E1165 Type 2 diabetes mellitus with hyperglycemia: Secondary | ICD-10-CM | POA: Diagnosis not present

## 2019-05-27 DIAGNOSIS — E7800 Pure hypercholesterolemia, unspecified: Secondary | ICD-10-CM | POA: Diagnosis not present

## 2019-05-27 DIAGNOSIS — I10 Essential (primary) hypertension: Secondary | ICD-10-CM

## 2019-05-27 NOTE — Patient Instructions (Signed)

## 2019-06-03 ENCOUNTER — Ambulatory Visit (INDEPENDENT_AMBULATORY_CARE_PROVIDER_SITE_OTHER): Payer: Medicare Other | Admitting: Internal Medicine

## 2019-06-03 ENCOUNTER — Other Ambulatory Visit: Payer: Self-pay

## 2019-06-03 VITALS — BP 132/64 | Ht 66.0 in | Wt 203.0 lb

## 2019-06-03 DIAGNOSIS — G35 Multiple sclerosis: Secondary | ICD-10-CM | POA: Diagnosis not present

## 2019-06-03 DIAGNOSIS — Z1211 Encounter for screening for malignant neoplasm of colon: Secondary | ICD-10-CM

## 2019-06-03 DIAGNOSIS — K219 Gastro-esophageal reflux disease without esophagitis: Secondary | ICD-10-CM | POA: Diagnosis not present

## 2019-06-03 DIAGNOSIS — E119 Type 2 diabetes mellitus without complications: Secondary | ICD-10-CM

## 2019-06-03 DIAGNOSIS — I6523 Occlusion and stenosis of bilateral carotid arteries: Secondary | ICD-10-CM

## 2019-06-03 DIAGNOSIS — E038 Other specified hypothyroidism: Secondary | ICD-10-CM | POA: Diagnosis not present

## 2019-06-03 DIAGNOSIS — I25118 Atherosclerotic heart disease of native coronary artery with other forms of angina pectoris: Secondary | ICD-10-CM | POA: Diagnosis not present

## 2019-06-03 DIAGNOSIS — E7800 Pure hypercholesterolemia, unspecified: Secondary | ICD-10-CM

## 2019-06-03 DIAGNOSIS — J4520 Mild intermittent asthma, uncomplicated: Secondary | ICD-10-CM

## 2019-06-03 DIAGNOSIS — D509 Iron deficiency anemia, unspecified: Secondary | ICD-10-CM | POA: Diagnosis not present

## 2019-06-03 DIAGNOSIS — K50919 Crohn's disease, unspecified, with unspecified complications: Secondary | ICD-10-CM

## 2019-06-03 DIAGNOSIS — Z0000 Encounter for general adult medical examination without abnormal findings: Secondary | ICD-10-CM

## 2019-06-03 DIAGNOSIS — G35D Multiple sclerosis, unspecified: Secondary | ICD-10-CM

## 2019-06-03 DIAGNOSIS — Z853 Personal history of malignant neoplasm of breast: Secondary | ICD-10-CM | POA: Diagnosis not present

## 2019-06-03 DIAGNOSIS — I10 Essential (primary) hypertension: Secondary | ICD-10-CM | POA: Diagnosis not present

## 2019-06-03 DIAGNOSIS — Z720 Tobacco use: Secondary | ICD-10-CM

## 2019-06-03 NOTE — Progress Notes (Addendum)
Patient ID: Haley Santos, female   DOB: 11-Sep-1945, 74 y.o.   MRN: [id number]   Virtual Visit via telephone Note  This visit type was conducted due to national recommendations for restrictions regarding the COVID-19 pandemic (e.g. social distancing).  This format is felt to be most appropriate for this patient at this time.  All issues noted in this document were discussed and addressed.  No physical exam was performed (except for noted visual exam findings with Video Visits).   I connected with Haley Santos by telephone and verified that I am speaking with the correct person using two identifiers. Location patient: home Location provider: work Persons participating in the telephone visit: patient, provider  The limitations, risks, security and privacy concerns of performing an evaluation and management service by telephone and the availability of in person appointments have been discussed.  The patient expressed understanding and agreed to proceed.   Reason for visit: scheduled follow up.    HPI: She reports she is doing relatively well.  Just saw Dr Rockey Situ 05/27/19.  Blood pressure looked good at visit.  Tachycardia stable on metoprolol.  On crestor/zetia. Felt stable from cardiac standpoint.  She reports sometimes notices - breathing harder.  No change in breathing.  Overall feels stable.  No increased acid reflux reported.  Eating a lot of soup.  No abdominal pain reported.  Bowels have slowed down some.  Some increased fatigue.  Eating more ice.  Some leg cramps at night.  Using warm compresses.  Wants to hold on making any changes.  Sleep previously reversed.  Has been better the last two weeks.  Increased stress with covid. She overall feels she is handling things relatively well.  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  Discussed the need for colon cancer screening.  Overdue.  Agreed to cologuard. Discussed the need to stop smoking.  She elects to continue.     ROS: See  pertinent positives and negatives per HPI.  Past Medical History:  Diagnosis Date  . Asthma with bronchitis   . Breast cancer ([place]) 2014  . CAD (coronary artery disease)    a. 08/2014: inf STEMI s/p DES to RCA.   Marland Kitchen COPD (chronic obstructive pulmonary disease) ([place])   . Crohn's disease ([place])   . Dyspnea    DOE  . GERD (gastroesophageal reflux disease)   . Glaucoma   . Glucose intolerance (impaired glucose tolerance)    a. HgA1c 6.2 during 08/2014 admission   . Hypercholesterolemia   . Hypertension   . Hyperthyroidism    a. s/p ablation maintained on synthroid  . Hypothyroidism   . Multiple sclerosis ([place])   . Myocardial infarction ([place])    2016  . Neuromuscular disorder ([place])    MS  . Tobacco abuse     Past Surgical History:  Procedure Laterality Date  . AUGMENTATION MAMMAPLASTY Left   . [place]  . CATARACT EXTRACTION W/PHACO Left 07/19/2017   Procedure: CATARACT EXTRACTION PHACO AND INTRAOCULAR LENS PLACEMENT (IOC);  Surgeon: Birder Robson, MD;  Location: [facility] ORS;  Service: Ophthalmology;  Laterality: Left;  [place] 00:49.0 AP% 14.5 CDE 7.10 Fluid Pack Lot # [id number]  . CATARACT EXTRACTION W/PHACO Right 08/09/2017   Procedure: CATARACT EXTRACTION PHACO AND INTRAOCULAR LENS PLACEMENT ([place]);  Surgeon: Birder Robson, MD;  Location: [facility] ORS;  Service: Ophthalmology;  Laterality: Right;  [place] 00:42 AP% 13.8 CDE 5.88 Fluid pack lot #[id number]  . COLONOSCOPY  2010   Dr Vira Agar  . CORONARY ANGIOPLASTY     STENT 2016  . LEFT HEART CATHETERIZATION WITH CORONARY ANGIOGRAM N/A 09/01/2014   Procedure: LEFT HEART CATHETERIZATION WITH CORONARY ANGIOGRAM;  Surgeon: Peter M Martinique, MD;  Location: [facility] CATH LAB;  Service: Cardiovascular;  Laterality: N/A;  . MASTECTOMY Right 2014   PATRICAL MASTECOMY RIGHT RAD CHEMO  . PORTACATH PLACEMENT  11/07/2012  . RIGHT OOPHORECTOMY  1979  . TONSILLECTOMY AND ADENOIDECTOMY  1957    Family History    Problem Relation Age of Onset  . Diabetes Mother   . Heart disease Mother        myocardial infarction  . Glaucoma Mother   . Graves' disease Mother   . Heart attack Mother 23  . Lung disease Father   . Heart disease Other        half sister  . Peripheral vascular disease Other        half sister  . Breast cancer Neg Hx     SOCIAL HX: reviewed.    Current Outpatient Medications:  .  albuterol (VENTOLIN HFA) 108 (90 Base) MCG/ACT inhaler, Inhale into the lungs as needed for wheezing or shortness of breath., Disp: , Rfl:  .  clopidogrel (PLAVIX) 75 MG tablet, Take 1 tablet (75 mg total) by mouth daily., Disp: 90 tablet, Rfl: 0 .  ezetimibe (ZETIA) 10 MG tablet, Take 1 tablet by mouth once daily, Disp: 90 tablet, Rfl: 3 .  Fluticasone-Salmeterol (ADVAIR) 250-50 MCG/DOSE AEPB, INHALE 1 DOSE BY MOUTH TWICE DAILY, Disp: 180 each, Rfl: 0 .  latanoprost (XALATAN) 0.005 % ophthalmic solution, Place 1 drop into both eyes at bedtime. , Disp: , Rfl:  .  levothyroxine (SYNTHROID) 112 MCG tablet, Take 1 tablet by mouth once daily, Disp: 90 tablet, Rfl: 0 .  metoprolol tartrate (LOPRESSOR) 25 MG tablet, Take 1/2 (one-half) tablet by mouth twice daily, Disp: 90 tablet, Rfl: 3 .  omeprazole (PRILOSEC OTC) 20 MG tablet, Take 20 mg by mouth daily., Disp: , Rfl:  .  rosuvastatin (CRESTOR) 10 MG tablet, TAKE 1 TABLET BY MOUTH AT BEDTIME, Disp: 30 tablet, Rfl: 2 .  VENTOLIN HFA 108 (90 Base) MCG/ACT inhaler, INHALE 2 PUFFS BY MOUTH EVERY 6 HOURS AS NEEDED FOR WHEEZING FOR SHORTNESS OF BREATH, Disp: 54 g, Rfl: 0  EXAM:  GENERAL: alert.  Sounds to be in no acute distress.  Answering questions appropriately.    PSYCH/NEURO: pleasant and cooperative, no obvious depression or anxiety, speech and thought processing grossly intact  ASSESSMENT AND PLAN:  Discussed the following assessment and plan:  Diabetes mellitus (HCC) Low carb diet and exercise.  Follow met b and a1c.  Schedule for urine  microalbumin/cr ration.   Anemia Follow cbc and ferritin.  Eating ice.  Declines colonoscopy.  Agreed to cologuard.    Asthma Breathing stable.   CAD (coronary artery disease) Just evaluated by Dr Rockey Situ.  Stable.  Continue risk factor modification.    Carotid artery stenosis Evaluated by [facility].  Last evaluated by 09/2017.  Stable.  Recommended f/u in one year.  Just saw cardiology.  Continue risk factor modification.   Crohn's disease Has seen GI.  Declines colonoscopy.    GERD (gastroesophageal reflux disease) Controlled.   History of breast cancer Mammogram 02/13/19 - Birads I.    Hypercholesterolemia On crestor and zetia.  Low cholesterol diet and exercise.  Follow lipid panel and liver function tests.    Hypertension Blood pressure under good control.  Continue same medication regimen.  Follow pressures.  Follow metabolic panel.    Hypothyroidism On thyroid replacement.  Follow tsh.   Multiple sclerosis Has desired no further evaluation or w/up previously.   Tobacco abuse Discussed the need to stop smoking.  She elects to continue.  Follow.    Orders Placed This Encounter  Procedures  . CBC with Differential/Platelet    Standing Status:   Future    Standing Expiration Date:   06/08/2020  . Hemoglobin A1c    Standing Status:   Future    Standing Expiration Date:   06/08/2020  . Hepatic function panel    Standing Status:   Future    Standing Expiration Date:   06/08/2020  . Lipid panel    Standing Status:   Future    Standing Expiration Date:   06/08/2020  . TSH    Standing Status:   Future    Standing Expiration Date:   06/08/2020  . Basic metabolic panel    Standing Status:   Future    Standing Expiration Date:   06/08/2020  . Microalbumin / creatinine urine ratio    Standing Status:   Future    Standing Expiration Date:   06/08/2020  . IBC + Ferritin    Standing Status:   Future    Standing Expiration Date:   06/08/2020  . Cologuard    Standing Status:    Future    Standing Expiration Date:   06/08/2020     I discussed the assessment and treatment plan with the patient. The patient was provided an opportunity to ask questions and all were answered. The patient agreed with the plan and demonstrated an understanding of the instructions.   The patient was advised to call back or seek an in-person evaluation if the symptoms worsen or if the condition fails to improve as anticipated.  I provided 25 minutes of non-face-to-face time during this encounter.  Addendum.  Reviewed chart.  pts last colonoscopy 2007.  She has declined f/u colonoscopy.  Discussed with her after labs - IDA and need for f/u colonoscopy - she declines.   Einar Pheasant, MD

## 2019-06-09 ENCOUNTER — Encounter: Payer: Self-pay | Admitting: Internal Medicine

## 2019-06-09 NOTE — Assessment & Plan Note (Signed)
Follow cbc and ferritin.  Eating ice.  Declines colonoscopy.  Agreed to cologuard.

## 2019-06-09 NOTE — Assessment & Plan Note (Signed)
Evaluated by [facility].  Last evaluated by 09/2017.  Stable.  Recommended f/u in one year.  Just saw cardiology.  Continue risk factor modification.

## 2019-06-09 NOTE — Assessment & Plan Note (Signed)
Blood pressure under good control.  Continue same medication regimen.  Follow pressures.  Follow metabolic panel.   

## 2019-06-09 NOTE — Assessment & Plan Note (Signed)
On thyroid replacement.  Follow tsh.  

## 2019-06-09 NOTE — Assessment & Plan Note (Signed)
On crestor and zetia.  Low cholesterol diet and exercise.  Follow lipid panel and liver function tests.

## 2019-06-09 NOTE — Addendum Note (Signed)
Addended by: Alisa Graff on: 06/09/2019 01:14 PM   Modules accepted: Orders

## 2019-06-09 NOTE — Assessment & Plan Note (Signed)
Discussed the need to stop smoking.  She elects to continue.  Follow.

## 2019-06-09 NOTE — Assessment & Plan Note (Signed)
Has desired no further evaluation or w/up previously.

## 2019-06-09 NOTE — Assessment & Plan Note (Signed)
Just evaluated by Dr Rockey Situ.  Stable.  Continue risk factor modification.

## 2019-06-09 NOTE — Assessment & Plan Note (Signed)
Has seen GI.  Declines colonoscopy.

## 2019-06-09 NOTE — Assessment & Plan Note (Signed)
Breathing stable.

## 2019-06-09 NOTE — Assessment & Plan Note (Signed)
Low carb diet and exercise.  Follow met b and a1c.  Schedule for urine microalbumin/cr ration.

## 2019-06-09 NOTE — Assessment & Plan Note (Signed)
Mammogram 02/13/19 - Birads I.

## 2019-06-09 NOTE — Assessment & Plan Note (Signed)
Controlled.  

## 2019-06-20 ENCOUNTER — Other Ambulatory Visit: Payer: Self-pay

## 2019-06-20 ENCOUNTER — Other Ambulatory Visit (INDEPENDENT_AMBULATORY_CARE_PROVIDER_SITE_OTHER): Payer: Medicare Other

## 2019-06-20 DIAGNOSIS — E119 Type 2 diabetes mellitus without complications: Secondary | ICD-10-CM

## 2019-06-20 DIAGNOSIS — D509 Iron deficiency anemia, unspecified: Secondary | ICD-10-CM

## 2019-06-20 DIAGNOSIS — E038 Other specified hypothyroidism: Secondary | ICD-10-CM

## 2019-06-20 DIAGNOSIS — E7800 Pure hypercholesterolemia, unspecified: Secondary | ICD-10-CM

## 2019-06-20 LAB — CBC WITH DIFFERENTIAL/PLATELET
Basophils Absolute: 0 10*3/uL (ref 0.0–0.1)
Basophils Relative: 0.5 % (ref 0.0–3.0)
Eosinophils Absolute: 0.3 10*3/uL (ref 0.0–0.7)
Eosinophils Relative: 4.8 % (ref 0.0–5.0)
HCT: 34.5 % — ABNORMAL LOW (ref 36.0–46.0)
Hemoglobin: 11.1 g/dL — ABNORMAL LOW (ref 12.0–15.0)
Lymphocytes Relative: 18.7 % (ref 12.0–46.0)
Lymphs Abs: 1.3 10*3/uL (ref 0.7–4.0)
MCHC: 32.2 g/dL (ref 30.0–36.0)
MCV: 73.5 fl — ABNORMAL LOW (ref 78.0–100.0)
Monocytes Absolute: 0.4 10*3/uL (ref 0.1–1.0)
Monocytes Relative: 6.1 % (ref 3.0–12.0)
Neutro Abs: 4.9 10*3/uL (ref 1.4–7.7)
Neutrophils Relative %: 69.9 % (ref 43.0–77.0)
Platelets: 243 10*3/uL (ref 150.0–400.0)
RBC: 4.69 Mil/uL (ref 3.87–5.11)
RDW: 16.5 % — ABNORMAL HIGH (ref 11.5–15.5)
WBC: 7 10*3/uL (ref 4.0–10.5)

## 2019-06-20 LAB — LIPID PANEL
Cholesterol: 139 mg/dL (ref 0–200)
HDL: 40.3 mg/dL (ref >39.00)
LDL Cholesterol: 63 mg/dL (ref 0–99)
NonHDL: 98.82
Total CHOL/HDL Ratio: 3
Triglycerides: 177 mg/dL — ABNORMAL HIGH (ref 0.0–149.0)
VLDL: 35.4 mg/dL (ref 0.0–40.0)

## 2019-06-20 LAB — BASIC METABOLIC PANEL
BUN: 10 mg/dL (ref 6–23)
CO2: 30 mEq/L (ref 19–32)
Calcium: 9.4 mg/dL (ref 8.4–10.5)
Chloride: 101 mEq/L (ref 96–112)
Creatinine, Ser: 0.88 mg/dL (ref 0.40–1.20)
GFR: 63.25 mL/min (ref >60.00)
Glucose, Bld: 139 mg/dL — ABNORMAL HIGH (ref 70–99)
Potassium: 4.3 mEq/L (ref 3.5–5.1)
Sodium: 137 mEq/L (ref 135–145)

## 2019-06-20 LAB — HEPATIC FUNCTION PANEL
ALT: 12 U/L (ref 0–35)
AST: 16 U/L (ref 0–37)
Albumin: 4 g/dL (ref 3.5–5.2)
Alkaline Phosphatase: 95 U/L (ref 39–117)
Bilirubin, Direct: 0.1 mg/dL (ref 0.0–0.3)
Total Bilirubin: 0.5 mg/dL (ref 0.2–1.2)
Total Protein: 7.1 g/dL (ref 6.0–8.3)

## 2019-06-20 LAB — IBC + FERRITIN
Ferritin: 9.1 ng/mL — ABNORMAL LOW (ref 10.0–291.0)
Iron: 43 ug/dL (ref 42–145)
Saturation Ratios: 9.8 % — ABNORMAL LOW (ref 20.0–50.0)
Transferrin: 313 mg/dL (ref 212.0–360.0)

## 2019-06-20 LAB — MICROALBUMIN / CREATININE URINE RATIO
Creatinine,U: 101.1 mg/dL
Microalb Creat Ratio: 1.8 mg/g (ref 0.0–30.0)
Microalb, Ur: 1.8 mg/dL (ref 0.0–1.9)

## 2019-06-20 LAB — HEMOGLOBIN A1C: Hgb A1c MFr Bld: 7.4 % — ABNORMAL HIGH (ref 4.6–6.5)

## 2019-06-20 LAB — TSH: TSH: 2.26 u[IU]/mL (ref 0.35–4.50)

## 2019-06-26 ENCOUNTER — Other Ambulatory Visit: Payer: Self-pay | Admitting: Internal Medicine

## 2019-06-26 DIAGNOSIS — D509 Iron deficiency anemia, unspecified: Secondary | ICD-10-CM

## 2019-06-26 NOTE — Progress Notes (Signed)
Order placed for f/u labs.  

## 2019-07-08 ENCOUNTER — Other Ambulatory Visit: Payer: Self-pay | Admitting: Cardiovascular Disease

## 2019-07-08 ENCOUNTER — Other Ambulatory Visit: Payer: Self-pay | Admitting: Internal Medicine

## 2019-07-20 ENCOUNTER — Telehealth: Payer: Self-pay | Admitting: Internal Medicine

## 2019-07-20 NOTE — Telephone Encounter (Signed)
Opened in error

## 2019-07-24 ENCOUNTER — Other Ambulatory Visit (INDEPENDENT_AMBULATORY_CARE_PROVIDER_SITE_OTHER): Payer: Medicare Other

## 2019-07-24 ENCOUNTER — Other Ambulatory Visit: Payer: Self-pay

## 2019-07-24 DIAGNOSIS — D509 Iron deficiency anemia, unspecified: Secondary | ICD-10-CM | POA: Diagnosis not present

## 2019-07-24 LAB — CBC WITH DIFFERENTIAL/PLATELET
Basophils Absolute: 0 10*3/uL (ref 0.0–0.1)
Basophils Relative: 0.4 % (ref 0.0–3.0)
Eosinophils Absolute: 0.3 10*3/uL (ref 0.0–0.7)
Eosinophils Relative: 3.6 % (ref 0.0–5.0)
HCT: 38.6 % (ref 36.0–46.0)
Hemoglobin: 12.6 g/dL (ref 12.0–15.0)
Lymphocytes Relative: 17.3 % (ref 12.0–46.0)
Lymphs Abs: 1.5 10*3/uL (ref 0.7–4.0)
MCHC: 32.6 g/dL (ref 30.0–36.0)
MCV: 77.4 fl — ABNORMAL LOW (ref 78.0–100.0)
Monocytes Absolute: 0.4 10*3/uL (ref 0.1–1.0)
Monocytes Relative: 5.4 % (ref 3.0–12.0)
Neutro Abs: 6.2 10*3/uL (ref 1.4–7.7)
Neutrophils Relative %: 73.3 % (ref 43.0–77.0)
Platelets: 214 10*3/uL (ref 150.0–400.0)
RBC: 4.99 Mil/uL (ref 3.87–5.11)
RDW: 20.9 % — ABNORMAL HIGH (ref 11.5–15.5)
WBC: 8.4 10*3/uL (ref 4.0–10.5)

## 2019-07-24 LAB — FERRITIN: Ferritin: 23.2 ng/mL (ref 10.0–291.0)

## 2019-07-26 ENCOUNTER — Encounter: Payer: Self-pay | Admitting: Internal Medicine

## 2019-07-29 ENCOUNTER — Other Ambulatory Visit: Payer: Self-pay | Admitting: Cardiovascular Disease

## 2019-07-29 ENCOUNTER — Other Ambulatory Visit: Payer: Self-pay | Admitting: Internal Medicine

## 2019-08-11 ENCOUNTER — Other Ambulatory Visit: Payer: Self-pay | Admitting: Internal Medicine

## 2019-10-11 ENCOUNTER — Other Ambulatory Visit: Payer: Self-pay | Admitting: Internal Medicine

## 2019-10-21 DIAGNOSIS — H401131 Primary open-angle glaucoma, bilateral, mild stage: Secondary | ICD-10-CM | POA: Diagnosis not present

## 2019-10-28 ENCOUNTER — Ambulatory Visit (INDEPENDENT_AMBULATORY_CARE_PROVIDER_SITE_OTHER): Payer: Medicare Other | Admitting: Vascular Surgery

## 2019-10-28 ENCOUNTER — Other Ambulatory Visit: Payer: Self-pay

## 2019-10-28 ENCOUNTER — Ambulatory Visit (INDEPENDENT_AMBULATORY_CARE_PROVIDER_SITE_OTHER): Payer: Medicare Other

## 2019-10-28 ENCOUNTER — Other Ambulatory Visit: Payer: Self-pay | Admitting: Internal Medicine

## 2019-10-28 ENCOUNTER — Encounter (INDEPENDENT_AMBULATORY_CARE_PROVIDER_SITE_OTHER): Payer: Self-pay | Admitting: Vascular Surgery

## 2019-10-28 VITALS — BP 134/74 | HR 83 | Resp 16 | Wt 200.6 lb

## 2019-10-28 DIAGNOSIS — I6523 Occlusion and stenosis of bilateral carotid arteries: Secondary | ICD-10-CM

## 2019-10-28 DIAGNOSIS — I25118 Atherosclerotic heart disease of native coronary artery with other forms of angina pectoris: Secondary | ICD-10-CM

## 2019-10-28 DIAGNOSIS — I872 Venous insufficiency (chronic) (peripheral): Secondary | ICD-10-CM | POA: Diagnosis not present

## 2019-10-28 DIAGNOSIS — I10 Essential (primary) hypertension: Secondary | ICD-10-CM | POA: Diagnosis not present

## 2019-10-28 DIAGNOSIS — E7800 Pure hypercholesterolemia, unspecified: Secondary | ICD-10-CM

## 2019-10-29 ENCOUNTER — Encounter (INDEPENDENT_AMBULATORY_CARE_PROVIDER_SITE_OTHER): Payer: Self-pay | Admitting: Vascular Surgery

## 2019-10-29 NOTE — Progress Notes (Signed)
MRN : [id number]  Haley Santos is a 74 y.o. (1945-09-09) female who presents with chief complaint of  Chief Complaint  Patient presents with  . Follow-up    ultrasound follow up  .  History of Present Illness: The patient is seen for evaluation of carotid stenosis. The carotid stenosis was identifiedyears ago by ultrasound.  The patient denies amaurosis fugax. There is no recent history of TIA symptoms or focal motor deficits. There is no prior documented CVA.  There is no history of migraine headaches. There is no history of seizures.  The patient is taking enteric-coated aspirin 81 mg daily.  She notes her swelling has essentially resolved unless "I stand in the heat for a long time"  The patient has a history of coronary artery disease, no recent episodes of angina or shortness of breath. The patient denies PAD or claudication symptoms. There is a history of hyperlipidemia which is being treated with a statin.  Carotid duplex shows 40-59% bilateral ICA stenosis, there is no change from the previous study dated 10/25/2018   Current Meds  Medication Sig  . albuterol (VENTOLIN HFA) 108 (90 Base) MCG/ACT inhaler Inhale into the lungs as needed for wheezing or shortness of breath.  . clopidogrel (PLAVIX) 75 MG tablet Take 1 tablet by mouth once daily  . EUTHYROX 112 MCG tablet Take 1 tablet by mouth once daily  . ezetimibe (ZETIA) 10 MG tablet Take 1 tablet by mouth once daily  . Fluticasone-Salmeterol (ADVAIR) 250-50 MCG/DOSE AEPB INHALE 1 DOSE BY MOUTH TWICE DAILY  . latanoprost (XALATAN) 0.005 % ophthalmic solution Place 1 drop into both eyes at bedtime.   . metoprolol tartrate (LOPRESSOR) 25 MG tablet Take 1/2 (one-half) tablet by mouth twice daily  . omeprazole (PRILOSEC OTC) 20 MG tablet Take 20 mg by mouth daily.  . rosuvastatin (CRESTOR) 10 MG tablet TAKE 1 TABLET BY MOUTH AT BEDTIME  . VENTOLIN HFA 108 (90 Base) MCG/ACT inhaler INHALE 2 PUFFS BY MOUTH EVERY  6 HOURS AS NEEDED FOR WHEEZING FOR SHORTNESS OF BREATH    Past Medical History:  Diagnosis Date  . Asthma with bronchitis   . Breast cancer ([place]) 2014  . CAD (coronary artery disease)    a. 08/2014: inf STEMI s/p DES to RCA.   Marland Kitchen COPD (chronic obstructive pulmonary disease) ([place])   . Crohn's disease ([place])   . Dyspnea    DOE  . GERD (gastroesophageal reflux disease)   . Glaucoma   . Glucose intolerance (impaired glucose tolerance)    a. HgA1c 6.2 during 08/2014 admission   . Hypercholesterolemia   . Hypertension   . Hyperthyroidism    a. s/p ablation maintained on synthroid  . Hypothyroidism   . Multiple sclerosis ([place])   . Myocardial infarction ([place])    2016  . Neuromuscular disorder ([place])    MS  . Tobacco abuse     Past Surgical History:  Procedure Laterality Date  . AUGMENTATION MAMMAPLASTY Left   . [place]  . CATARACT EXTRACTION W/PHACO Left 07/19/2017   Procedure: CATARACT EXTRACTION PHACO AND INTRAOCULAR LENS PLACEMENT (IOC);  Surgeon: Birder Robson, MD;  Location: [facility] ORS;  Service: Ophthalmology;  Laterality: Left;  [place] 00:49.0 AP% 14.5 CDE 7.10 Fluid Pack Lot # [id number]  . CATARACT EXTRACTION W/PHACO Right 08/09/2017   Procedure: CATARACT EXTRACTION PHACO AND INTRAOCULAR LENS PLACEMENT ([place]);  Surgeon: Birder Robson, MD;  Location: [facility] ORS;  Service: Ophthalmology;  Laterality: Right;  [place] 00:42 AP% 13.8 CDE 5.88 Fluid pack lot #[id number]  . COLONOSCOPY  2010   Dr Vira Agar  . CORONARY ANGIOPLASTY     STENT 2016  . LEFT HEART CATHETERIZATION WITH CORONARY ANGIOGRAM N/A 09/01/2014   Procedure: LEFT HEART CATHETERIZATION WITH CORONARY ANGIOGRAM;  Surgeon: Peter M Martinique, MD;  Location[facility] CATH LAB;  Service: Cardiovascular;  Laterality: N/A;  . MASTECTOMY Right 2014   PATRICAL MASTECOMY RIGHT RAD CHEMO  . PORTACATH PLACEMENT  11/07/2012  . RIGHT OOPHORECTOMY  1979  . TONSILLECTOMY AND ADENOIDECTOMY  31     Social History Social History   Tobacco Use  . Smoking status: Current Every Day Smoker    Packs/day: 1.00    Years: 40.00    Pack years: 40.00    Last attempt to quit: 07/01/2015    Years since quitting: 4.3  . Smokeless tobacco: Never Used  Substance Use Topics  . Alcohol use: Yes    Alcohol/week: 0.0 standard drinks    Comment: RARE  . Drug use: No    Family History Family History  Problem Relation Age of Onset  . Diabetes Mother   . Heart disease Mother        myocardial infarction  . Glaucoma Mother   . Graves' disease Mother   . Heart attack Mother 71  . Lung disease Father   . Heart disease Other        half sister  . Peripheral vascular disease Other        half sister  . Breast cancer Neg Hx     Allergies  Allergen Reactions  . Statins Other (See Comments)    Muscle weakness/pain. Trials of multiple agents. Muscle weakness/pain. Trials of multiple agents. Muscle weakness/pain. Trials of multiple agents.  . Clindamycin/Lincomycin Other (See Comments)    Digestive upset  . Codeine Other (See Comments)    "hallucinations"  . Shellfish Allergy Nausea And Vomiting    Felt goofy headed  . Valium [Diazepam] Anxiety and Other (See Comments)    hyperactivity     REVIEW OF SYSTEMS (Negative unless checked)  Constitutional: [] Weight loss  [] Fever  [] Chills Cardiac: [] Chest pain   [] Chest pressure   [] Palpitations   [] Shortness of breath when laying flat   [] Shortness of breath with exertion. Vascular:  [] Pain in legs with walking   [] Pain in legs at rest  [] History of DVT   [] Phlebitis   [] Swelling in legs   [] Varicose veins   [] Non-healing ulcers Pulmonary:   [] Uses home oxygen   [] Productive cough   [] Hemoptysis   [] Wheeze  [] COPD   [] Asthma Neurologic:  [] Dizziness   [] Seizures   [] History of stroke   [] History of TIA  [] Aphasia   [] Vissual changes   [] Weakness or numbness in arm   [] Weakness or numbness in leg Musculoskeletal:   [] Joint swelling    [] Joint pain   [] Low back pain Hematologic:  [] Easy bruising  [] Easy bleeding   [] Hypercoagulable state   [] Anemic Gastrointestinal:  [] Diarrhea   [] Vomiting  [] Gastroesophageal reflux/heartburn   [] Difficulty swallowing. Genitourinary:  [] Chronic kidney disease   [] Difficult urination  [] Frequent urination   [] Blood in urine Skin:  [] Rashes   [] Ulcers  Psychological:  [] History of anxiety   []  History of major depression.  Physical Examination  Vitals:   10/28/19 1437  BP: 134/74  Pulse: 83  Resp: 16  Weight: 200 lb 9.6 oz (91 kg)   Body mass index is 32.38 kg/m.  Gen: WD/WN, NAD Head: [place]/AT, No temporalis wasting.  Ear/Nose/Throat: Hearing grossly intact, nares w/o erythema or drainage Eyes: PER, EOMI, sclera nonicteric.  Neck: Supple, no large masses.   Pulmonary:  Good air movement, no audible wheezing bilaterally, no use of accessory muscles.  Cardiac: RRR, no JVD Vascular: Bilateral carotid bruits Vessel Right Left  Radial Palpable Palpable  Carotid Palpable Palpable  Gastrointestinal: Non-distended. No guarding/no peritoneal signs.  Musculoskeletal: M/S 5/5 throughout.  No deformity or atrophy.  Neurologic: CN 2-12 intact. Symmetrical.  Speech is fluent. Motor exam as listed above. Psychiatric: Judgment intact, Mood & affect appropriate for pt's clinical situation. Dermatologic: No rashes or ulcers noted.  No changes consistent with cellulitis.  CBC Lab Results  Component Value Date   WBC 8.4 07/24/2019   HGB 12.6 07/24/2019   HCT 38.6 07/24/2019   MCV 77.4 (L) 07/24/2019   PLT 214.0 07/24/2019    BMET    Component Value Date/Time   NA 137 06/20/2019 1007   NA 134 (A) 07/08/2014 0000   NA 139 05/01/2014 1008   K 4.3 06/20/2019 1007   K 4.0 05/01/2014 1008   CL 101 06/20/2019 1007   CL 102 05/01/2014 1008   CO2 30 06/20/2019 1007   CO2 30 05/01/2014 1008   GLUCOSE 139 (H) 06/20/2019 1007   GLUCOSE 107 (H) 05/01/2014 1008   BUN 10 06/20/2019 1007   BUN  13 07/08/2014 0000   BUN 13 05/01/2014 1008   CREATININE 0.88 06/20/2019 1007   CREATININE 0.91 05/01/2014 1008   CALCIUM 9.4 06/20/2019 1007   CALCIUM 9.1 05/01/2014 1008   GFRNONAA 56 (L) 07/04/2015 1445   GFRNONAA >60 05/01/2014 1008   GFRNONAA 57 (L) 01/29/2014 1021   GFRAA >60 07/04/2015 1445   GFRAA >60 05/01/2014 1008   GFRAA >60 01/29/2014 1021   CrCl cannot be calculated (Patient's most recent lab result is older than the maximum 21 days allowed.).  COAG No results found for: INR, PROTIME  Radiology VAS US CAROTID  Result Date: 10/28/2019 Carotid Arterial Duplex Study Indications:       Carotid artery disease. Risk Factors:      Hypertension. Comparison Study:  10/25/2018; No change from Previous study. Performing Technologist: Almira Coaster RVS  Examination Guidelines: A complete evaluation includes B-mode imaging, spectral Doppler, color Doppler, and power Doppler as needed of all accessible portions of each vessel. Bilateral testing is considered an integral part of a complete examination. Limited examinations for reoccurring indications may be performed as noted.  Right Carotid Findings: +----------+--------+--------+--------+------------------+--------+           PSV cm/sEDV cm/sStenosisPlaque DescriptionComments +----------+--------+--------+--------+------------------+--------+ CCA Prox  78      16                                         +----------+--------+--------+--------+------------------+--------+ CCA Mid   76      8                                          +----------+--------+--------+--------+------------------+--------+ CCA Distal80      14                                         +----------+--------+--------+--------+------------------+--------+  ICA Prox  122     23                                         +----------+--------+--------+--------+------------------+--------+ ICA Mid   95      25                                          +----------+--------+--------+--------+------------------+--------+ ICA Distal102     22                                         +----------+--------+--------+--------+------------------+--------+ ECA       140     13                                         +----------+--------+--------+--------+------------------+--------+ +----------+--------+-------+--------+-------------------+           PSV cm/sEDV cmsDescribeArm Pressure (mmHG) +----------+--------+-------+--------+-------------------+ Subclavian152     0                                  +----------+--------+-------+--------+-------------------+ +---------+--------+--+--------+--+ VertebralPSV cm/s69EDV cm/s13 +---------+--------+--+--------+--+  Left Carotid Findings: +----------+--------+--------+--------+------------------+--------+           PSV cm/sEDV cm/sStenosisPlaque DescriptionComments +----------+--------+--------+--------+------------------+--------+ CCA Prox  96      19                                         +----------+--------+--------+--------+------------------+--------+ CCA Mid   107     24                                         +----------+--------+--------+--------+------------------+--------+ CCA Distal106     17                                         +----------+--------+--------+--------+------------------+--------+ ICA Prox  143     34                                         +----------+--------+--------+--------+------------------+--------+ ICA Mid   150     33                                         +----------+--------+--------+--------+------------------+--------+ ICA Distal134     28                                         +----------+--------+--------+--------+------------------+--------+ ECA       322     18                                         +----------+--------+--------+--------+------------------+--------+  +----------+--------+--------+--------+-------------------+  PSV cm/sEDV cm/sDescribeArm Pressure (mmHG) +----------+--------+--------+--------+-------------------+ [id number]     0                                   +----------+--------+--------+--------+-------------------+ +---------+--------+--+--------+-+ VertebralPSV cm/s51EDV cm/s7 +---------+--------+--+--------+-+   Summary: Right Carotid: Velocities in the right ICA are consistent with a 40-59%                stenosis. Left Carotid: Velocities in the left ICA are consistent with a 40-59% stenosis.               The ECA appears >50% stenosed. Vertebrals:  Bilateral vertebral arteries demonstrate antegrade flow. Subclavians: Normal flow hemodynamics were seen in bilateral subclavian              arteries. *See table(s) above for measurements and observations.  Electronically signed by Hortencia Pilar MD on 10/28/2019 at 5:19:32 PM.    Final      Assessment/Plan 1. Bilateral carotid artery stenosis Recommend:  Given the patient's asymptomatic subcritical stenosis no further invasive testing or surgery at this time.  Duplex ultrasound shows 40-59% stenosis bilaterally.  Continue antiplatelet therapy as prescribed Continue management of CAD, HTN and Hyperlipidemia Healthy heart diet,  encouraged exercise at least 4 times per week Follow up in 12 months with duplex ultrasound and physical exam  - VAS US CAROTID; Future  2. Chronic venous insufficiency No surgery or intervention at this point in time.    I have reviewed my discussion with the patient regarding venous insufficiency and secondary lymph edema and why it  causes symptoms. I have discussed with the patient the chronic skin changes that accompany these problems and the long term sequela such as ulceration and infection.  Patient will continue wearing graduated compression stockings class 1 (20-30 mmHg) on a daily basis a prescription was given to the  patient to keep this updated. The patient will  put the stockings on first thing in the morning and removing them in the evening. The patient is instructed specifically not to sleep in the stockings.  In addition, behavioral modification including elevation during the day will be continued.  Diet and salt restriction was also discussed.  Previous duplex ultrasound of the lower extremities shows normal deep venous system, superficial reflux was not present.   Following the review of the ultrasound the patient will follow up in 12 months to reassess the degree of swelling and the control that graduated compression is offering.   The patient can be assessed for a Lymph Pump at that time.  However, at this time the patient states they are satisfied with the control compression and elevation is yielding.    3. Essential hypertension Continue antihypertensive medications as already ordered, these medications have been reviewed and there are no changes at this time.   4. Coronary artery disease of native artery of native heart with stable angina pectoris (HCC) Continue cardiac and antihypertensive medications as already ordered and reviewed, no changes at this time.  Continue statin as ordered and reviewed, no changes at this time  Nitrates PRN for chest pain   5. Hypercholesterolemia Continue statin as ordered and reviewed, no changes at this time     Hortencia Pilar, MD  10/29/2019 12:27 PM

## 2019-12-09 ENCOUNTER — Other Ambulatory Visit: Payer: Self-pay

## 2019-12-10 ENCOUNTER — Ambulatory Visit (INDEPENDENT_AMBULATORY_CARE_PROVIDER_SITE_OTHER): Payer: Medicare Other | Admitting: Internal Medicine

## 2019-12-10 ENCOUNTER — Encounter: Payer: Self-pay | Admitting: Internal Medicine

## 2019-12-10 DIAGNOSIS — E7800 Pure hypercholesterolemia, unspecified: Secondary | ICD-10-CM

## 2019-12-10 DIAGNOSIS — K219 Gastro-esophageal reflux disease without esophagitis: Secondary | ICD-10-CM

## 2019-12-10 DIAGNOSIS — G35 Multiple sclerosis: Secondary | ICD-10-CM

## 2019-12-10 DIAGNOSIS — R05 Cough: Secondary | ICD-10-CM

## 2019-12-10 DIAGNOSIS — K50919 Crohn's disease, unspecified, with unspecified complications: Secondary | ICD-10-CM

## 2019-12-10 DIAGNOSIS — Z720 Tobacco use: Secondary | ICD-10-CM

## 2019-12-10 DIAGNOSIS — R059 Cough, unspecified: Secondary | ICD-10-CM

## 2019-12-10 DIAGNOSIS — E038 Other specified hypothyroidism: Secondary | ICD-10-CM

## 2019-12-10 DIAGNOSIS — I10 Essential (primary) hypertension: Secondary | ICD-10-CM | POA: Diagnosis not present

## 2019-12-10 DIAGNOSIS — I6523 Occlusion and stenosis of bilateral carotid arteries: Secondary | ICD-10-CM | POA: Diagnosis not present

## 2019-12-10 DIAGNOSIS — D509 Iron deficiency anemia, unspecified: Secondary | ICD-10-CM

## 2019-12-10 DIAGNOSIS — Z853 Personal history of malignant neoplasm of breast: Secondary | ICD-10-CM | POA: Diagnosis not present

## 2019-12-10 DIAGNOSIS — E1165 Type 2 diabetes mellitus with hyperglycemia: Secondary | ICD-10-CM

## 2019-12-10 DIAGNOSIS — E1159 Type 2 diabetes mellitus with other circulatory complications: Secondary | ICD-10-CM

## 2019-12-10 MED ORDER — PREDNISONE 10 MG PO TABS: ORAL_TABLET | ORAL | 0 refills | Status: DC

## 2019-12-10 MED ORDER — AMOXICILLIN-POT CLAVULANATE 875-125 MG PO TABS: 1.0000 | ORAL_TABLET | Freq: Two times a day (BID) | ORAL | 0 refills | Status: DC

## 2019-12-10 NOTE — Progress Notes (Signed)
Patient ID: Haley Santos, female   DOB: 06/16/1945, 74 y.o.   MRN: [id number]   Subjective:    Patient ID: Haley Santos, female    DOB: Jun 24, 1945, 74 y.o.   MRN: [id number]  HPI This visit occurred during the SARS-CoV-2 public health emergency.  Safety protocols were in place, including screening questions prior to the visit, additional usage of staff PPE, and extensive cleaning of exam room while observing appropriate contact time as indicated for disinfecting solutions.  Patient here for a scheduled follow up. She reports increased congestion.  Cough.  Using mucinex.  Has advair and albuterol.  Using advair regularly.  Progression of symptoms.  No fever.  Taking omeprazole.  No chest pain.  sees Dr Delana Meyer for f/u carotids.  Evaluated 10/28/19.  Stable.  Recommended f/u in 12 months.  No abdominal pain.  Bowels moving.   Handling stress.  Continuing to smoke.  Discussed the need to quit.     Past Medical History:  Diagnosis Date  . Asthma with bronchitis   . Breast cancer ([place]) 2014  . CAD (coronary artery disease)    a. 08/2014: inf STEMI s/p DES to RCA.   Marland Kitchen COPD (chronic obstructive pulmonary disease) ([place])   . Crohn's disease ([place])   . Dyspnea    DOE  . GERD (gastroesophageal reflux disease)   . Glaucoma   . Glucose intolerance (impaired glucose tolerance)    a. HgA1c 6.2 during 08/2014 admission   . Hypercholesterolemia   . Hypertension   . Hyperthyroidism    a. s/p ablation maintained on synthroid  . Hypothyroidism   . Multiple sclerosis ([place])   . Myocardial infarction ([place])    2016  . Neuromuscular disorder ([place])    MS  . Tobacco abuse    Past Surgical History:  Procedure Laterality Date  . AUGMENTATION MAMMAPLASTY Left   . [place]  . CATARACT EXTRACTION W/PHACO Left 07/19/2017   Procedure: CATARACT EXTRACTION PHACO AND INTRAOCULAR LENS PLACEMENT (IOC);  Surgeon: Birder Robson, MD;  Location: [facility] ORS;  Service:  Ophthalmology;  Laterality: Left;  [place] 00:49.0 AP% 14.5 CDE 7.10 Fluid Pack Lot # [id number]  . CATARACT EXTRACTION W/PHACO Right 08/09/2017   Procedure: CATARACT EXTRACTION PHACO AND INTRAOCULAR LENS PLACEMENT ([place]);  Surgeon: Birder Robson, MD;  Location: [facility] ORS;  Service: Ophthalmology;  Laterality: Right;  [place] 00:42 AP% 13.8 CDE 5.88 Fluid pack lot #[id number]  . COLONOSCOPY  2010   Dr Vira Agar  . CORONARY ANGIOPLASTY     STENT 2016  . LEFT HEART CATHETERIZATION WITH CORONARY ANGIOGRAM N/A 09/01/2014   Procedure: LEFT HEART CATHETERIZATION WITH CORONARY ANGIOGRAM;  Surgeon: Peter M Martinique, MD;  Location: [facility] CATH LAB;  Service: Cardiovascular;  Laterality: N/A;  . MASTECTOMY Right 2014   PATRICAL MASTECOMY RIGHT RAD CHEMO  . PORTACATH PLACEMENT  11/07/2012  . RIGHT OOPHORECTOMY  1979  . TONSILLECTOMY AND ADENOIDECTOMY  1957   Family History  Problem Relation Age of Onset  . Diabetes Mother   . Heart disease Mother        myocardial infarction  . Glaucoma Mother   . Graves' disease Mother   . Heart attack Mother 7  . Lung disease Father   . Heart disease Other        half sister  . Peripheral vascular disease Other        half sister  . Breast cancer Neg Hx    Social History  Socioeconomic History  . Marital status: Divorced    Spouse name: Not on file  . Number of children: 1  . Years of education: Not on file  . Highest education level: Not on file  Occupational History  . Not on file  Tobacco Use  . Smoking status: Current Every Day Smoker    Packs/day: 1.00    Years: 40.00    Pack years: 40.00    Last attempt to quit: 07/01/2015    Years since quitting: 4.4  . Smokeless tobacco: Never Used  Substance and Sexual Activity  . Alcohol use: Yes    Alcohol/week: 0.0 standard drinks    Comment: RARE  . Drug use: No  . Sexual activity: Not on file  Other Topics Concern  . Not on file  Social History Narrative  . Not on file   Social Determinants of Health     Financial Resource Strain:   . Difficulty of Paying Living Expenses:   Food Insecurity:   . Worried About Charity fundraiser in the Last Year:   . Arboriculturist in the Last Year:   Transportation Needs:   . Film/video editor (Medical):   Marland Kitchen Lack of Transportation (Non-Medical):   Physical Activity:   . Days of Exercise per Week:   . Minutes of Exercise per Session:   Stress:   . Feeling of Stress :   Social Connections:   . Frequency of Communication with Friends and Family:   . Frequency of Social Gatherings with Friends and Family:   . Attends Religious Services:   . Active Member of Clubs or Organizations:   . Attends Archivist Meetings:   Marland Kitchen Marital Status:     Outpatient Encounter Medications as of 12/10/2019  Medication Sig  . albuterol (VENTOLIN HFA) 108 (90 Base) MCG/ACT inhaler Inhale into the lungs as needed for wheezing or shortness of breath.  . clopidogrel (PLAVIX) 75 MG tablet Take 1 tablet by mouth once daily  . EUTHYROX 112 MCG tablet Take 1 tablet by mouth once daily  . ezetimibe (ZETIA) 10 MG tablet Take 1 tablet by mouth once daily  . Fluticasone-Salmeterol (ADVAIR) 250-50 MCG/DOSE AEPB INHALE 1 DOSE BY MOUTH TWICE DAILY  . latanoprost (XALATAN) 0.005 % ophthalmic solution Place 1 drop into both eyes at bedtime.   . metoprolol tartrate (LOPRESSOR) 25 MG tablet Take 1/2 (one-half) tablet by mouth twice daily  . omeprazole (PRILOSEC OTC) 20 MG tablet Take 20 mg by mouth daily.  . rosuvastatin (CRESTOR) 10 MG tablet TAKE 1 TABLET BY MOUTH AT BEDTIME  . VENTOLIN HFA 108 (90 Base) MCG/ACT inhaler INHALE 2 PUFFS BY MOUTH EVERY 6 HOURS AS NEEDED FOR WHEEZING FOR SHORTNESS OF BREATH  . amoxicillin-clavulanate (AUGMENTIN) 875-125 MG tablet Take 1 tablet by mouth 2 (two) times daily.  . predniSONE (DELTASONE) 10 MG tablet Take 4 tablets x 1 day and then decrease by 1/2 tablet per day until down to zero mg.   No facility-administered encounter  medications on file as of 12/10/2019.   Review of Systems  Constitutional: Negative for appetite change and unexpected weight change.  HENT: Positive for congestion. Negative for sore throat.   Respiratory: Positive for cough. Negative for chest tightness and shortness of breath.   Cardiovascular: Negative for chest pain, palpitations and leg swelling.  Gastrointestinal: Negative for abdominal pain, diarrhea, nausea and vomiting.  Genitourinary: Negative for difficulty urinating and dysuria.  Musculoskeletal: Negative for joint swelling and myalgias.  Skin: Negative for color change and rash.  Neurological: Negative for dizziness, light-headedness and headaches.  Psychiatric/Behavioral: Negative for agitation and dysphoric mood.       Objective:    Physical Exam Vitals reviewed.  Constitutional:      General: She is not in acute distress.    Appearance: Normal appearance.  HENT:     Head: Normocephalic and atraumatic.     Right Ear: External ear normal.     Left Ear: External ear normal.  Eyes:     General: No scleral icterus.       Right eye: No discharge.        Left eye: No discharge.     Conjunctiva/sclera: Conjunctivae normal.  Neck:     Thyroid: No thyromegaly.  Cardiovascular:     Rate and Rhythm: Normal rate and regular rhythm.  Pulmonary:     Effort: No respiratory distress.     Breath sounds: Normal breath sounds. No wheezing.  Abdominal:     General: Bowel sounds are normal.     Palpations: Abdomen is soft.     Tenderness: There is no abdominal tenderness.  Musculoskeletal:        General: No swelling or tenderness.     Cervical back: Neck supple. No tenderness.  Lymphadenopathy:     Cervical: No cervical adenopathy.  Skin:    Findings: No erythema or rash.  Neurological:     Mental Status: She is alert.  Psychiatric:        Mood and Affect: Mood normal.        Behavior: Behavior normal.     BP (!) 138/80   Pulse 84   Temp 98.3 F (36.8 C)    Resp 16   Ht 5' 6"  (1.676 m)   Wt 200 lb (90.7 kg)   LMP 06/15/1977   SpO2 98%   BMI 32.28 kg/m  Wt Readings from Last 3 Encounters:  12/10/19 200 lb (90.7 kg)  10/28/19 200 lb 9.6 oz (91 kg)  06/03/19 203 lb (92.1 kg)     Lab Results  Component Value Date   WBC 8.4 07/24/2019   HGB 12.6 07/24/2019   HCT 38.6 07/24/2019   PLT 214.0 07/24/2019   GLUCOSE 139 (H) 06/20/2019   CHOL 139 06/20/2019   TRIG 177.0 (H) 06/20/2019   HDL 40.30 06/20/2019   LDLDIRECT 116.0 03/14/2017   LDLCALC 63 06/20/2019   ALT 12 06/20/2019   AST 16 06/20/2019   NA 137 06/20/2019   K 4.3 06/20/2019   CL 101 06/20/2019   CREATININE 0.88 06/20/2019   BUN 10 06/20/2019   CO2 30 06/20/2019   TSH 2.26 06/20/2019   HGBA1C 7.4 (H) 06/20/2019   MICROALBUR 1.8 06/20/2019    MM 3D SCREEN BREAST W/IMPLANT BILATERAL  Result Date: 02/13/2019 CLINICAL DATA:  Screening. History of a partial right mastectomy. Residual right breast tissue was imaged. EXAM: DIGITAL SCREENING BILATERAL MAMMOGRAM WITH IMPLANTS, CAD AND TOMO The patient has a left prepectoral implant. Standard and implant displaced views were performed. COMPARISON:  Previous exam(s). ACR Breast Density Category c: The breast tissue is heterogeneously dense, which may obscure small masses. FINDINGS: There are no findings suspicious for malignancy. Stable post lumpectomy changes on right. Images were processed with CAD. IMPRESSION: No mammographic evidence of malignancy. A result letter of this screening mammogram will be mailed directly to the patient. RECOMMENDATION: Screening mammogram in one year. (Code:SM-B-01Y) BI-RADS CATEGORY  1:  Negative. Electronically Signed   By:  Lajean Manes M.D.   On: 02/13/2019 16:22       Assessment & Plan:   Problem List Items Addressed This Visit    Anemia    Follow cbc.       Relevant Orders   IBC + Ferritin   CBC with Differential/Platelet   Carotid artery stenosis    Followed by [facility].  Stable.   Recommended f/u in one year.  Last seen 10/28/19.        Cough    Increased cough and congestion.  Continue advair.  Has albuterol if needed.  Continue mucinex.  Prednisone taper as directed.  rx given for augmentin if needed.  Discussed the need to stop smoking.  Declines cxr.       Crohn's disease ([place])    Has seen GI.  Has declined colonoscopy.       GERD (gastroesophageal reflux disease)    On omeprazole.        History of breast cancer    Mammogram 02/13/19 - Birads I.  Has declined f/u with oncology.       Hypercholesterolemia    On crestor and zetia.  Low cholesterol diet and exercise.  Follow lipid panel and liver function tests.        Relevant Orders   Hepatic function panel   Lipid panel   Hypertension    Blood pressure as outlined.  Continue metoprolol.  Follow pressures.  Follow metabolic panel.       Hypothyroidism    On thyroid replacement.  Follow tsh.       Multiple sclerosis ([place])    Has desires no f/u or further evaluation.        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H[facility])    Discussed diet and exercise.  Follow met b and a1c.  Overdue labs.        Relevant Orders   Hemoglobin R9F   Basic metabolic panel   Tobacco abuse    Continues to smoke.  Discussed the need to quit.  She declines to quit at this time.           Einar Pheasant, MD

## 2019-12-16 ENCOUNTER — Encounter: Payer: Self-pay | Admitting: Internal Medicine

## 2019-12-16 NOTE — Assessment & Plan Note (Signed)
Follow cbc.  

## 2019-12-16 NOTE — Assessment & Plan Note (Signed)
Has desires no f/u or further evaluation.

## 2019-12-16 NOTE — Assessment & Plan Note (Signed)
On crestor and zetia.  Low cholesterol diet and exercise.  Follow lipid panel and liver function tests.

## 2019-12-16 NOTE — Assessment & Plan Note (Signed)
Has seen GI.  Has declined colonoscopy.

## 2019-12-16 NOTE — Assessment & Plan Note (Signed)
Blood pressure as outlined.  Continue metoprolol.  Follow pressures.  Follow metabolic panel.

## 2019-12-16 NOTE — Assessment & Plan Note (Signed)
Increased cough and congestion.  Continue advair.  Has albuterol if needed.  Continue mucinex.  Prednisone taper as directed.  rx given for augmentin if needed.  Discussed the need to stop smoking.  Declines cxr.

## 2019-12-16 NOTE — Assessment & Plan Note (Signed)
On omeprazole.  

## 2019-12-16 NOTE — Assessment & Plan Note (Signed)
Mammogram 02/13/19 - Birads I.  Has declined f/u with oncology.

## 2019-12-16 NOTE — Assessment & Plan Note (Signed)
Followed by [facility].  Stable.  Recommended f/u in one year.  Last seen 10/28/19.

## 2019-12-16 NOTE — Assessment & Plan Note (Signed)
Discussed diet and exercise.  Follow met b and a1c.  Overdue labs.

## 2019-12-16 NOTE — Assessment & Plan Note (Signed)
Continues to smoke.  Discussed the need to quit.  She declines to quit at this time.

## 2019-12-16 NOTE — Assessment & Plan Note (Signed)
On thyroid replacement.  Follow tsh.  

## 2019-12-25 ENCOUNTER — Other Ambulatory Visit (INDEPENDENT_AMBULATORY_CARE_PROVIDER_SITE_OTHER): Payer: Medicare Other

## 2019-12-25 ENCOUNTER — Other Ambulatory Visit: Payer: Self-pay

## 2019-12-25 DIAGNOSIS — E1159 Type 2 diabetes mellitus with other circulatory complications: Secondary | ICD-10-CM | POA: Diagnosis not present

## 2019-12-25 DIAGNOSIS — E7800 Pure hypercholesterolemia, unspecified: Secondary | ICD-10-CM

## 2019-12-25 DIAGNOSIS — D509 Iron deficiency anemia, unspecified: Secondary | ICD-10-CM

## 2019-12-25 DIAGNOSIS — E1165 Type 2 diabetes mellitus with hyperglycemia: Secondary | ICD-10-CM | POA: Diagnosis not present

## 2019-12-25 LAB — CBC WITH DIFFERENTIAL/PLATELET
Basophils Absolute: 0.1 10*3/uL (ref 0.0–0.1)
Basophils Relative: 0.5 % (ref 0.0–3.0)
Eosinophils Absolute: 0.3 10*3/uL (ref 0.0–0.7)
Eosinophils Relative: 2.9 % (ref 0.0–5.0)
HCT: 40.7 % (ref 36.0–46.0)
Hemoglobin: 13.7 g/dL (ref 12.0–15.0)
Lymphocytes Relative: 14.7 % (ref 12.0–46.0)
Lymphs Abs: 1.6 10*3/uL (ref 0.7–4.0)
MCHC: 33.6 g/dL (ref 30.0–36.0)
MCV: 82.5 fl (ref 78.0–100.0)
Monocytes Absolute: 0.7 10*3/uL (ref 0.1–1.0)
Monocytes Relative: 6.5 % (ref 3.0–12.0)
Neutro Abs: 8.1 10*3/uL — ABNORMAL HIGH (ref 1.4–7.7)
Neutrophils Relative %: 75.4 % (ref 43.0–77.0)
Platelets: 195 10*3/uL (ref 150.0–400.0)
RBC: 4.93 Mil/uL (ref 3.87–5.11)
RDW: 16.1 % — ABNORMAL HIGH (ref 11.5–15.5)
WBC: 10.7 10*3/uL — ABNORMAL HIGH (ref 4.0–10.5)

## 2019-12-25 LAB — BASIC METABOLIC PANEL
BUN: 9 mg/dL (ref 6–23)
CO2: 27 mEq/L (ref 19–32)
Calcium: 9 mg/dL (ref 8.4–10.5)
Chloride: 99 mEq/L (ref 96–112)
Creatinine, Ser: 0.88 mg/dL (ref 0.40–1.20)
GFR: 63.15 mL/min (ref >60.00)
Glucose, Bld: 162 mg/dL — ABNORMAL HIGH (ref 70–99)
Potassium: 4.1 mEq/L (ref 3.5–5.1)
Sodium: 137 mEq/L (ref 135–145)

## 2019-12-25 LAB — LIPID PANEL
Cholesterol: 150 mg/dL (ref 0–200)
HDL: 50.1 mg/dL (ref >39.00)
LDL Cholesterol: 66 mg/dL (ref 0–99)
NonHDL: 99.44
Total CHOL/HDL Ratio: 3
Triglycerides: 167 mg/dL — ABNORMAL HIGH (ref 0.0–149.0)
VLDL: 33.4 mg/dL (ref 0.0–40.0)

## 2019-12-25 LAB — HEMOGLOBIN A1C: Hgb A1c MFr Bld: 7.4 % — ABNORMAL HIGH (ref 4.6–6.5)

## 2019-12-25 LAB — HEPATIC FUNCTION PANEL
ALT: 14 U/L (ref 0–35)
AST: 12 U/L (ref 0–37)
Albumin: 3.9 g/dL (ref 3.5–5.2)
Alkaline Phosphatase: 91 U/L (ref 39–117)
Bilirubin, Direct: 0.2 mg/dL (ref 0.0–0.3)
Total Bilirubin: 0.8 mg/dL (ref 0.2–1.2)
Total Protein: 6.6 g/dL (ref 6.0–8.3)

## 2019-12-25 LAB — IBC + FERRITIN
Ferritin: 21.4 ng/mL (ref 10.0–291.0)
Iron: 78 ug/dL (ref 42–145)
Saturation Ratios: 20.6 % (ref 20.0–50.0)
Transferrin: 270 mg/dL (ref 212.0–360.0)

## 2019-12-30 ENCOUNTER — Telehealth: Payer: Self-pay

## 2019-12-30 NOTE — Telephone Encounter (Signed)
LMTCB regarding labs 

## 2020-01-03 ENCOUNTER — Telehealth: Payer: Self-pay | Admitting: Internal Medicine

## 2020-01-03 NOTE — Telephone Encounter (Signed)
Patient having severe abdominal pain rated >10 on pain scale. No more vomiting this morning. Denies fever, chills, body aches, diarrhea, constipation, etc. Pain is constant. Feels like there is a "ball" in her LLQ. Requested in office appt. Advised patient that she needed to go to acute care or ED due to pain level. Patient is going to [facility] urgent care.

## 2020-01-03 NOTE — Telephone Encounter (Signed)
Pt called she is having sever abdominal pain and throwing up on and off for the past week only wants an in-office appt with Dr. Nicki Reaper  Pt said that she is not going to an UC or ED and didn't want to be transferred to Access Nurse  I explained to her that she wouldn't  be able to come into the office with abdominal pain and vomiting

## 2020-01-06 ENCOUNTER — Other Ambulatory Visit: Payer: Self-pay | Admitting: Internal Medicine

## 2020-01-21 ENCOUNTER — Other Ambulatory Visit: Payer: Self-pay | Admitting: Internal Medicine

## 2020-03-25 ENCOUNTER — Other Ambulatory Visit: Payer: Self-pay | Admitting: Internal Medicine

## 2020-04-10 ENCOUNTER — Other Ambulatory Visit: Payer: Self-pay | Admitting: Cardiovascular Disease

## 2020-04-13 NOTE — Telephone Encounter (Signed)
Rx request sent to pharmacy.  

## 2020-04-14 ENCOUNTER — Encounter: Payer: Self-pay | Admitting: Internal Medicine

## 2020-04-14 ENCOUNTER — Other Ambulatory Visit: Payer: Self-pay

## 2020-04-14 ENCOUNTER — Ambulatory Visit (INDEPENDENT_AMBULATORY_CARE_PROVIDER_SITE_OTHER): Payer: Medicare Other | Admitting: Internal Medicine

## 2020-04-14 VITALS — BP 128/76 | HR 82 | Temp 98.3°F | Resp 16 | Ht 66.0 in | Wt 197.0 lb

## 2020-04-14 DIAGNOSIS — I25118 Atherosclerotic heart disease of native coronary artery with other forms of angina pectoris: Secondary | ICD-10-CM | POA: Diagnosis not present

## 2020-04-14 DIAGNOSIS — R059 Cough, unspecified: Secondary | ICD-10-CM

## 2020-04-14 DIAGNOSIS — Z1231 Encounter for screening mammogram for malignant neoplasm of breast: Secondary | ICD-10-CM

## 2020-04-14 DIAGNOSIS — K50919 Crohn's disease, unspecified, with unspecified complications: Secondary | ICD-10-CM | POA: Diagnosis not present

## 2020-04-14 DIAGNOSIS — I10 Essential (primary) hypertension: Secondary | ICD-10-CM | POA: Diagnosis not present

## 2020-04-14 DIAGNOSIS — K219 Gastro-esophageal reflux disease without esophagitis: Secondary | ICD-10-CM | POA: Diagnosis not present

## 2020-04-14 DIAGNOSIS — J4520 Mild intermittent asthma, uncomplicated: Secondary | ICD-10-CM

## 2020-04-14 DIAGNOSIS — I6523 Occlusion and stenosis of bilateral carotid arteries: Secondary | ICD-10-CM

## 2020-04-14 DIAGNOSIS — D509 Iron deficiency anemia, unspecified: Secondary | ICD-10-CM

## 2020-04-14 DIAGNOSIS — Z853 Personal history of malignant neoplasm of breast: Secondary | ICD-10-CM

## 2020-04-14 DIAGNOSIS — Z0000 Encounter for general adult medical examination without abnormal findings: Secondary | ICD-10-CM

## 2020-04-14 DIAGNOSIS — Z720 Tobacco use: Secondary | ICD-10-CM | POA: Diagnosis not present

## 2020-04-14 DIAGNOSIS — E038 Other specified hypothyroidism: Secondary | ICD-10-CM

## 2020-04-14 DIAGNOSIS — E7800 Pure hypercholesterolemia, unspecified: Secondary | ICD-10-CM

## 2020-04-14 DIAGNOSIS — E1159 Type 2 diabetes mellitus with other circulatory complications: Secondary | ICD-10-CM | POA: Diagnosis not present

## 2020-04-14 DIAGNOSIS — G35 Multiple sclerosis: Secondary | ICD-10-CM

## 2020-04-14 DIAGNOSIS — E1165 Type 2 diabetes mellitus with hyperglycemia: Secondary | ICD-10-CM

## 2020-04-14 MED ORDER — AMOXICILLIN-POT CLAVULANATE 875-125 MG PO TABS: 1.0000 | ORAL_TABLET | Freq: Two times a day (BID) | ORAL | 0 refills | Status: DC

## 2020-04-14 NOTE — Progress Notes (Signed)
Patient ID: Haley Santos, female   DOB: 21-Jun-1945, 74 y.o.   MRN: [id number]   Subjective:    Patient ID: Haley Santos, female    DOB: 01-03-1946, 74 y.o.   MRN: [id number]  HPI This visit occurred during the SARS-CoV-2 public health emergency.  Safety protocols were in place, including screening questions prior to the visit, additional usage of staff PPE, and extensive cleaning of exam room while observing appropriate contact time as indicated for disinfecting solutions.  Patient with past history of diabetes, hypertension, CAD and hypercholesterolemia.  She comes in today to follow up on these issues as well as for a complete physical exam.  Reports some increased cough and congestion.  Taking mucinex.  Abscess - gum/teeth.   No fever.  Using rescue inhaler.  Sees cardiology.  Stable.  No acid reflux reported.  No abdominal pain.  Bowels stable.  Saw Dr Delana Meyer 10/2019 - f/u carotids.  Recommended f/u in 12 months.    Past Medical History:  Diagnosis Date  . Asthma with bronchitis   . Breast cancer ([place]) 2014  . CAD (coronary artery disease)    a. 08/2014: inf STEMI s/p DES to RCA.   Marland Kitchen COPD (chronic obstructive pulmonary disease) ([place])   . Crohn's disease ([place])   . Dyspnea    DOE  . GERD (gastroesophageal reflux disease)   . Glaucoma   . Glucose intolerance (impaired glucose tolerance)    a. HgA1c 6.2 during 08/2014 admission   . Hypercholesterolemia   . Hypertension   . Hyperthyroidism    a. s/p ablation maintained on synthroid  . Hypothyroidism   . Multiple sclerosis ([place])   . Myocardial infarction ([place])    2016  . Neuromuscular disorder ([place])    MS  . Tobacco abuse    Past Surgical History:  Procedure Laterality Date  . AUGMENTATION MAMMAPLASTY Left   . [place]  . CATARACT EXTRACTION W/PHACO Left 07/19/2017   Procedure: CATARACT EXTRACTION PHACO AND INTRAOCULAR LENS PLACEMENT (IOC);  Surgeon: Birder Robson, MD;  Location:  [facility] ORS;  Service: Ophthalmology;  Laterality: Left;  [place] 00:49.0 AP% 14.5 CDE 7.10 Fluid Pack Lot # [id number]  . CATARACT EXTRACTION W/PHACO Right 08/09/2017   Procedure: CATARACT EXTRACTION PHACO AND INTRAOCULAR LENS PLACEMENT ([place]);  Surgeon: Birder Robson, MD;  Location: [facility] ORS;  Service: Ophthalmology;  Laterality: Right;  [place] 00:42 AP% 13.8 CDE 5.88 Fluid pack lot #[id number]  . COLONOSCOPY  2010   Dr Vira Agar  . CORONARY ANGIOPLASTY     STENT 2016  . LEFT HEART CATHETERIZATION WITH CORONARY ANGIOGRAM N/A 09/01/2014   Procedure: LEFT HEART CATHETERIZATION WITH CORONARY ANGIOGRAM;  Surgeon: Peter M Martinique, MD;  Location: [facility] CATH LAB;  Service: Cardiovascular;  Laterality: N/A;  . MASTECTOMY Right 2014   PATRICAL MASTECOMY RIGHT RAD CHEMO  . PORTACATH PLACEMENT  11/07/2012  . RIGHT OOPHORECTOMY  1979  . TONSILLECTOMY AND ADENOIDECTOMY  1957   Family History  Problem Relation Age of Onset  . Diabetes Mother   . Heart disease Mother        myocardial infarction  . Glaucoma Mother   . Graves' disease Mother   . Heart attack Mother 62  . Lung disease Father   . Heart disease Other        half sister  . Peripheral vascular disease Other        half sister  . Breast cancer Neg Hx  Social History   Socioeconomic History  . Marital status: Divorced    Spouse name: Not on file  . Number of children: 1  . Years of education: Not on file  . Highest education level: Not on file  Occupational History  . Not on file  Tobacco Use  . Smoking status: Current Every Day Smoker    Packs/day: 1.00    Years: 40.00    Pack years: 40.00    Last attempt to quit: 07/01/2015    Years since quitting: 4.8  . Smokeless tobacco: Never Used  Substance and Sexual Activity  . Alcohol use: Yes    Alcohol/week: 0.0 standard drinks    Comment: RARE  . Drug use: No  . Sexual activity: Not on file  Other Topics Concern  . Not on file  Social History Narrative  . Not on file   Social  Determinants of Health   Financial Resource Strain: Not on file  Food Insecurity: Not on file  Transportation Needs: Not on file  Physical Activity: Not on file  Stress: Not on file  Social Connections: Not on file    Outpatient Encounter Medications as of 04/14/2020  Medication Sig  . albuterol (VENTOLIN HFA) 108 (90 Base) MCG/ACT inhaler Inhale into the lungs as needed for wheezing or shortness of breath.  Marland Kitchen amoxicillin-clavulanate (AUGMENTIN) 875-125 MG tablet Take 1 tablet by mouth 2 (two) times daily.  . clopidogrel (PLAVIX) 75 MG tablet Take 1 tablet by mouth once daily  . EUTHYROX 112 MCG tablet Take 1 tablet by mouth once daily  . ezetimibe (ZETIA) 10 MG tablet Take 1 tablet by mouth once daily  . Fluticasone-Salmeterol (ADVAIR) 250-50 MCG/DOSE AEPB INHALE 1 DOSE BY MOUTH TWICE DAILY  . latanoprost (XALATAN) 0.005 % ophthalmic solution Place 1 drop into both eyes at bedtime.   . metoprolol tartrate (LOPRESSOR) 25 MG tablet Take 1/2 (one-half) tablet by mouth twice daily  . omeprazole (PRILOSEC OTC) 20 MG tablet Take 20 mg by mouth daily.  . rosuvastatin (CRESTOR) 10 MG tablet TAKE 1 TABLET BY MOUTH AT BEDTIME  . [DISCONTINUED] amoxicillin-clavulanate (AUGMENTIN) 875-125 MG tablet Take 1 tablet by mouth 2 (two) times daily.  . [DISCONTINUED] amoxicillin-clavulanate (AUGMENTIN) 875-125 MG tablet Take 1 tablet by mouth 2 (two) times daily.  . [DISCONTINUED] predniSONE (DELTASONE) 10 MG tablet Take 4 tablets x 1 day and then decrease by 1/2 tablet per day until down to zero mg.  . [DISCONTINUED] VENTOLIN HFA 108 (90 Base) MCG/ACT inhaler INHALE 2 PUFFS BY MOUTH EVERY 6 HOURS AS NEEDED FOR WHEEZING FOR SHORTNESS OF BREATH   No facility-administered encounter medications on file as of 04/14/2020.    Review of Systems  Constitutional: Negative for appetite change and unexpected weight change.  HENT: Negative for congestion, sinus pressure and sore throat.   Eyes: Negative for pain  and visual disturbance.  Respiratory: Positive for cough. Negative for chest tightness.        Increased congestion.   Cardiovascular: Negative for chest pain, palpitations and leg swelling.  Gastrointestinal: Negative for abdominal pain, diarrhea, nausea and vomiting.  Genitourinary: Negative for difficulty urinating and dysuria.  Musculoskeletal: Negative for joint swelling and myalgias.  Skin: Negative for color change and rash.  Neurological: Negative for dizziness, light-headedness and headaches.  Hematological: Negative for adenopathy. Does not bruise/bleed easily.  Psychiatric/Behavioral: Negative for agitation and dysphoric mood.       Objective:    Physical Exam Vitals reviewed.  Constitutional:  General: She is not in acute distress.    Appearance: Normal appearance. She is well-developed and well-nourished.  HENT:     Head: Normocephalic and atraumatic.     Right Ear: External ear normal.     Left Ear: External ear normal.     Mouth/Throat:     Mouth: Oropharynx is clear and moist.  Eyes:     General: No scleral icterus.       Right eye: No discharge.        Left eye: No discharge.     Conjunctiva/sclera: Conjunctivae normal.  Neck:     Thyroid: No thyromegaly.  Cardiovascular:     Rate and Rhythm: Normal rate and regular rhythm.  Pulmonary:     Effort: No tachypnea, accessory muscle usage or respiratory distress.     Breath sounds: Normal breath sounds. No decreased breath sounds or wheezing.  Chest:  Breasts:     Right: No inverted nipple, mass, nipple discharge or tenderness (no axillary adenopathy).     Left: No inverted nipple, mass, nipple discharge or tenderness (no axilarry adenopathy).    Abdominal:     General: Bowel sounds are normal.     Palpations: Abdomen is soft.     Tenderness: There is no abdominal tenderness.  Musculoskeletal:        General: No swelling, tenderness or edema.     Cervical back: Neck supple. No tenderness.   Lymphadenopathy:     Cervical: No cervical adenopathy.  Skin:    Findings: No erythema or rash.  Neurological:     Mental Status: She is alert and oriented to person, place, and time.  Psychiatric:        Mood and Affect: Mood and affect and mood normal.        Behavior: Behavior normal.     BP 128/76   Pulse 82   Temp 98.3 F (36.8 C) (Oral)   Resp 16   Ht 5' 6"  (1.676 m)   Wt 197 lb (89.4 kg)   LMP 06/15/1977   SpO2 98%   BMI 31.80 kg/m  Wt Readings from Last 3 Encounters:  04/14/20 197 lb (89.4 kg)  12/10/19 200 lb (90.7 kg)  10/28/19 200 lb 9.6 oz (91 kg)     Lab Results  Component Value Date   WBC 10.7 (H) 12/25/2019   HGB 13.7 12/25/2019   HCT 40.7 12/25/2019   PLT 195.0 12/25/2019   GLUCOSE 162 (H) 12/25/2019   CHOL 150 12/25/2019   TRIG 167.0 (H) 12/25/2019   HDL 50.10 12/25/2019   LDLDIRECT 116.0 03/14/2017   LDLCALC 66 12/25/2019   ALT 14 12/25/2019   AST 12 12/25/2019   NA 137 12/25/2019   K 4.1 12/25/2019   CL 99 12/25/2019   CREATININE 0.88 12/25/2019   BUN 9 12/25/2019   CO2 27 12/25/2019   TSH 2.26 06/20/2019   HGBA1C 7.4 (H) 12/25/2019   MICROALBUR 1.8 06/20/2019    MM 3D SCREEN BREAST W/IMPLANT BILATERAL  Result Date: 02/13/2019 CLINICAL DATA:  Screening. History of a partial right mastectomy. Residual right breast tissue was imaged. EXAM: DIGITAL SCREENING BILATERAL MAMMOGRAM WITH IMPLANTS, CAD AND TOMO The patient has a left prepectoral implant. Standard and implant displaced views were performed. COMPARISON:  Previous exam(s). ACR Breast Density Category c: The breast tissue is heterogeneously dense, which may obscure small masses. FINDINGS: There are no findings suspicious for malignancy. Stable post lumpectomy changes on right. Images were processed with CAD. IMPRESSION: No  mammographic evidence of malignancy. A result letter of this screening mammogram will be mailed directly to the patient. RECOMMENDATION: Screening mammogram in one  year. (Code:SM-B-01Y) BI-RADS CATEGORY  1:  Negative. Electronically Signed   By: Lajean Manes M.D.   On: 02/13/2019 16:22       Assessment & Plan:   Problem List Items Addressed This Visit    Tobacco abuse    Discussed the need to quit smoking.  Declines to quit.  Follow.       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HCC)    Low carb diet and exercise.  Follow met b and a1c.        Relevant Orders   Hemoglobin L9F   Basic metabolic panel   Multiple sclerosis (HCC)    Stable.  Desires no further intervention.  Follow.        Hypothyroidism    On thyroid replacement.  Follow tsh.       Hypertension    Blood pressure on recheck as outlined.  Continue metoprolol.  Follow pressures.  Follow metabolic panel.       Hypercholesterolemia    On crestor and zetia.  Low cholesterol diet and exercise.  Follow lipid panel and liver function tests.        Relevant Orders   Hepatic function panel   Lipid panel   History of breast cancer    Mammogram 02/13/19 - Birads I.  Due f/u mammogram.  Declines f/u with oncology.       Health care maintenance    Physical today 04/14/20.  PAP 10/2016 - negative with negative HPV.  Colonoscopy 08/2015.  Recommended f/u in 10 years. She will schedule mammogram.        GERD (gastroesophageal reflux disease)    On omeprazole.        Crohn's disease ([place])    Has seen GI.  Has declined colonoscopy.        Cough    Increased cough/congestion as outlined.  Continue steroid inhaler and rescue inhaler as directed.  Mucinex.  augmentin as directed.  Follow.        Carotid artery stenosis    Followed by [facility].  Stable.  Last seen 10/2019.  Recommend f/u in one year.        CAD (coronary artery disease)    Just evaluated by Dr Rockey Situ.  Stable. Continue risk factor modification.       Asthma    Increased cough and congestion as outlined.  Continue rescue inhaler and steroid inhaler.  augmentin as directed.  mucinex  as directed.        Anemia    Follow cbc.        Other Visit Diagnoses    Visit for screening mammogram    -  Primary   Relevant Orders   MM 3D SCREEN BREAST BILATERAL       Einar Pheasant, MD

## 2020-04-14 NOTE — Patient Instructions (Signed)
Take a probiotic daily while on the antibiotics and for two weeks after completing the antibiotics.

## 2020-04-20 ENCOUNTER — Encounter: Payer: Self-pay | Admitting: Internal Medicine

## 2020-04-20 DIAGNOSIS — H401132 Primary open-angle glaucoma, bilateral, moderate stage: Secondary | ICD-10-CM | POA: Diagnosis not present

## 2020-04-26 ENCOUNTER — Encounter: Payer: Self-pay | Admitting: Internal Medicine

## 2020-04-26 ENCOUNTER — Other Ambulatory Visit: Payer: Self-pay | Admitting: Cardiovascular Disease

## 2020-04-26 ENCOUNTER — Other Ambulatory Visit: Payer: Self-pay | Admitting: Internal Medicine

## 2020-04-26 NOTE — Assessment & Plan Note (Signed)
On thyroid replacement.  Follow tsh.  

## 2020-04-26 NOTE — Assessment & Plan Note (Signed)
Has seen GI.  Has declined colonoscopy.

## 2020-04-26 NOTE — Assessment & Plan Note (Signed)
Increased cough and congestion as outlined.  Continue rescue inhaler and steroid inhaler.  augmentin as directed.  mucinex as directed.

## 2020-04-26 NOTE — Assessment & Plan Note (Signed)
Stable.  Desires no further intervention.  Follow.

## 2020-04-26 NOTE — Assessment & Plan Note (Signed)
Just evaluated by Dr Rockey Situ.  Stable. Continue risk factor modification.

## 2020-04-26 NOTE — Assessment & Plan Note (Signed)
Physical today 04/14/20.  PAP 10/2016 - negative with negative HPV.  Colonoscopy 08/2015.  Recommended f/u in 10 years. She will schedule mammogram.

## 2020-04-26 NOTE — Assessment & Plan Note (Signed)
Low carb diet and exercise.  Follow met b and a1c.   

## 2020-04-26 NOTE — Assessment & Plan Note (Signed)
Increased cough/congestion as outlined.  Continue steroid inhaler and rescue inhaler as directed.  Mucinex.  augmentin as directed.  Follow.

## 2020-04-26 NOTE — Assessment & Plan Note (Signed)
Follow cbc.  

## 2020-04-26 NOTE — Assessment & Plan Note (Signed)
Followed by [facility].  Stable.  Last seen 10/2019.  Recommend f/u in one year.

## 2020-04-26 NOTE — Assessment & Plan Note (Signed)
On omeprazole.  

## 2020-04-26 NOTE — Assessment & Plan Note (Signed)
Discussed the need to quit smoking.  Declines to quit.  Follow.

## 2020-04-26 NOTE — Assessment & Plan Note (Signed)
Mammogram 02/13/19 - Birads I.  Due f/u mammogram.  Declines f/u with oncology.

## 2020-04-26 NOTE — Assessment & Plan Note (Signed)
Blood pressure on recheck as outlined.  Continue metoprolol.  Follow pressures.  Follow metabolic panel.

## 2020-04-26 NOTE — Assessment & Plan Note (Signed)
On crestor and zetia.  Low cholesterol diet and exercise.  Follow lipid panel and liver function tests.

## 2020-04-27 NOTE — Telephone Encounter (Signed)
Please schedule 12 month F/U appointment. Thank you!

## 2020-04-27 NOTE — Telephone Encounter (Signed)
Scheduled

## 2020-04-30 ENCOUNTER — Other Ambulatory Visit: Payer: Medicare Other

## 2020-05-06 ENCOUNTER — Other Ambulatory Visit: Payer: Self-pay | Admitting: Cardiovascular Disease

## 2020-06-04 ENCOUNTER — Telehealth: Payer: Self-pay | Admitting: Cardiovascular Disease

## 2020-06-04 NOTE — Telephone Encounter (Signed)
  Patient Consent for Virtual Visit         Haley Santos has provided verbal consent on 06/04/2020 for a virtual visit (video or telephone).   CONSENT FOR VIRTUAL VISIT FOR:  Haley Santos  By participating in this virtual visit I agree to the following:  I hereby voluntarily request, consent and authorize [facility] and its employed or contracted physicians, physician assistants, nurse practitioners or other licensed health care professionals (the Practitioner), to provide me with telemedicine health care services (the "Services") as deemed necessary by the treating Practitioner. I acknowledge and consent to receive the Services by the Practitioner via telemedicine. I understand that the telemedicine visit will involve communicating with the Practitioner through live audiovisual communication technology and the disclosure of certain medical information by electronic transmission. I acknowledge that I have been given the opportunity to request an in-person assessment or other available alternative prior to the telemedicine visit and am voluntarily participating in the telemedicine visit.  I understand that I have the right to withhold or withdraw my consent to the use of telemedicine in the course of my care at any time, without affecting my right to future care or treatment, and that the Practitioner or I may terminate the telemedicine visit at any time. I understand that I have the right to inspect all information obtained and/or recorded in the course of the telemedicine visit and may receive copies of available information for a reasonable fee.  I understand that some of the potential risks of receiving the Services via telemedicine include:  Marland Kitchen Delay or interruption in medical evaluation due to technological equipment failure or disruption; . Information transmitted may not be sufficient (e.g. poor resolution of images) to allow for appropriate medical decision making by the Practitioner;  and/or  . In rare instances, security protocols could fail, causing a breach of personal health information.  Furthermore, I acknowledge that it is my responsibility to provide information about my medical history, conditions and care that is complete and accurate to the best of my ability. I acknowledge that Practitioner's advice, recommendations, and/or decision may be based on factors not within their control, such as incomplete or inaccurate data provided by me or distortions of diagnostic images or specimens that may result from electronic transmissions. I understand that the practice of medicine is not an exact science and that Practitioner makes no warranties or guarantees regarding treatment outcomes. I acknowledge that a copy of this consent can be made available to me via my patient portal ([place]), or I can request a printed copy by calling the office of [facility].    I understand that my insurance will be billed for this visit.   I have read or had this consent read to me. . I understand the contents of this consent, which adequately explains the benefits and risks of the Services being provided via telemedicine.  . I have been provided ample opportunity to ask questions regarding this consent and the Services and have had my questions answered to my satisfaction. . I give my informed consent for the services to be provided through the use of telemedicine in my medical care

## 2020-06-05 ENCOUNTER — Encounter: Payer: Self-pay | Admitting: Cardiovascular Disease

## 2020-06-05 ENCOUNTER — Other Ambulatory Visit: Payer: Self-pay

## 2020-06-05 ENCOUNTER — Telehealth (INDEPENDENT_AMBULATORY_CARE_PROVIDER_SITE_OTHER): Payer: Medicare Other | Admitting: Cardiovascular Disease

## 2020-06-05 VITALS — Ht 66.0 in | Wt 195.0 lb

## 2020-06-05 DIAGNOSIS — F17200 Nicotine dependence, unspecified, uncomplicated: Secondary | ICD-10-CM | POA: Diagnosis not present

## 2020-06-05 DIAGNOSIS — I25118 Atherosclerotic heart disease of native coronary artery with other forms of angina pectoris: Secondary | ICD-10-CM

## 2020-06-05 DIAGNOSIS — E7800 Pure hypercholesterolemia, unspecified: Secondary | ICD-10-CM | POA: Diagnosis not present

## 2020-06-05 DIAGNOSIS — I10 Essential (primary) hypertension: Secondary | ICD-10-CM

## 2020-06-05 DIAGNOSIS — I739 Peripheral vascular disease, unspecified: Secondary | ICD-10-CM | POA: Diagnosis not present

## 2020-06-05 DIAGNOSIS — I6523 Occlusion and stenosis of bilateral carotid arteries: Secondary | ICD-10-CM

## 2020-06-05 MED ORDER — CLOPIDOGREL BISULFATE 75 MG PO TABS: 75.0000 mg | ORAL_TABLET | Freq: Every day | ORAL | 3 refills | Status: DC

## 2020-06-05 MED ORDER — METOPROLOL TARTRATE 25 MG PO TABS: 25.0000 mg | ORAL_TABLET | Freq: Every day | ORAL | 3 refills | Status: DC

## 2020-06-05 MED ORDER — EZETIMIBE 10 MG PO TABS: 10.0000 mg | ORAL_TABLET | Freq: Every day | ORAL | 3 refills | Status: DC

## 2020-06-05 MED ORDER — ROSUVASTATIN CALCIUM 10 MG PO TABS: 10.0000 mg | ORAL_TABLET | Freq: Every day | ORAL | 3 refills | Status: DC

## 2020-06-05 NOTE — Progress Notes (Signed)
Virtual Visit via Telephone Note   This visit type was conducted due to national recommendations for restrictions regarding the COVID-19 Pandemic (e.g. social distancing) in an effort to limit this patient's exposure and mitigate transmission in our community.  Due to her co-morbid illnesses, this patient is at least at moderate risk for complications without adequate follow up.  This format is felt to be most appropriate for this patient at this time.  The patient did not have access to video technology/had technical difficulties with video requiring transitioning to audio format only (telephone).  All issues noted in this document were discussed and addressed.  No physical exam could be performed with this format.  Please refer to the patient's chart for her  consent to telehealth for [facility].   I connected with  Haley Santos on 06/05/20 by a video enabled telemedicine application and verified that I am speaking with the correct person using two identifiers. I am contacting the patient above from our cardiology clinic office or alternate office work station to their home, I discussed the limitations of evaluation and management by telemedicine. The patient expressed understanding and agreed to proceed.   Evaluation Performed:  Follow-up visit  Date:  06/05/2020   ID:  Haley Santos, DOB Oct 04, 1945, MRN [id number]  Patient Location:  [address] [place] [address]   Provider location:   [facility], [place] office  PCP:  Einar Pheasant, MD  Cardiologist:  Patsy Baltimore   Chief Complaint  Patient presents with   Follow-up    12 month F/U-No new cardiac concerns    History of Present Illness:    Haley Santos is a 75 y.o. female who presents via audio/video conferencing for a telehealth visit today.   The patient does not symptoms concerning for COVID-19 infection (fever, chills, cough, or new SHORTNESS OF BREATH).   Patient has a past  medical history of obesity,  long history of smoking who continues to smoke,  Hyperlipidemia, intolerant of statins,  GERD,   hypertension   moderate carotid arterial disease on the left, 50% STEMI in 2016 with stent placed to mid RCA,  history of Crohn's Diabetes type 2 with hemoglobin A1c 7.4 who presents for routne follow-up of her coronary artery disease, hyperlipidemia  Takes magnesium for leg cramps Still on crestor  Does not want metformin  No recent labs HBA1C 7.4, lots of potato Total chol 150, LDL 66  Sedentary, does not go out of the house much No regular exercise program  Review of her carotid ultrasound with her shows 40-59%  disease on the left   Chronic joint pain , joint pain, 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  History of breast cancer, Adriamycin through port in the left chest Carotid ultrasound with 50-69% carotid stenosis  She has seen [facility] in the past and unable to tolerate statin secondary to myalgias. She reports a history of MS   Past Medical History:  Diagnosis Date   Asthma with bronchitis    Breast cancer ([place]) 2014   CAD (coronary artery disease)    a. 08/2014: inf STEMI s/p DES to RCA.    COPD (chronic obstructive pulmonary disease) (HCC)    Crohn's disease (HCC)    Dyspnea    DOE   GERD (gastroesophageal reflux disease)  Glaucoma    Glucose intolerance (impaired glucose tolerance)    a. HgA1c 6.2 during 08/2014 admission    Hypercholesterolemia    Hypertension    Hyperthyroidism    a. s/p ablation maintained on synthroid   Hypothyroidism    Multiple sclerosis ([place])    Myocardial infarction ([place])    2016   Neuromuscular disorder ([place])    MS   Tobacco abuse    Past Surgical History:  Procedure Laterality Date   AUGMENTATION MAMMAPLASTY  Left    BREAST ENHANCEMENT SURGERY  1981   [place] EXTRACTION W/PHACO Left 07/19/2017   Procedure: CATARACT EXTRACTION PHACO AND INTRAOCULAR LENS PLACEMENT ([place]);  Surgeon: Birder Robson, MD;  Location: [facility] ORS;  Service: Ophthalmology;  Laterality: Left;  [place] 00:49.0 AP% 14.5 CDE 7.10 Fluid Pack Lot # [id number]   CATARACT EXTRACTION W/PHACO Right 08/09/2017   Procedure: CATARACT EXTRACTION PHACO AND INTRAOCULAR LENS PLACEMENT (IOC);  Surgeon: Birder Robson, MD;  Location: [facility] ORS;  Service: Ophthalmology;  Laterality: Right;  [place] 00:42 AP% 13.8 CDE 5.88 Fluid pack lot [id number]   COLONOSCOPY  2010   Dr Vira Agar   CORONARY ANGIOPLASTY     STENT 2016   LEFT HEART CATHETERIZATION WITH CORONARY ANGIOGRAM N/A 09/01/2014   Procedure: LEFT HEART CATHETERIZATION WITH CORONARY ANGIOGRAM;  Surgeon: Peter M Martinique, MD;  Location: [facility] CATH LAB;  Service: Cardiovascular;  Laterality: N/A;   MASTECTOMY Right 2014   PATRICAL MASTECOMY RIGHT RAD CHEMO   PORTACATH PLACEMENT  11/07/2012   RIGHT OOPHORECTOMY  1979   TONSILLECTOMY AND ADENOIDECTOMY  1957     Allergies:   Statins, Clindamycin/lincomycin, Codeine, Shellfish allergy, and Valium [diazepam]   Social History   Tobacco Use   Smoking status: Current Every Day Smoker    Packs/day: 1.00    Years: 40.00    Pack years: 40.00    Last attempt to quit: 07/01/2015    Years since quitting: 4.9   Smokeless tobacco: Never Used  Vaping Use   Vaping Use: Never used  Substance Use Topics   Alcohol use: Yes    Alcohol/week: 0.0 standard drinks    Comment: RARE   Drug use: No     Current Outpatient Medications on File Prior to Visit  Medication Sig Dispense Refill   albuterol (VENTOLIN HFA) 108 (90 Base) MCG/ACT inhaler Inhale into the lungs as needed for wheezing or shortness of breath.     EUTHYROX 112 MCG tablet Take 1 tablet by mouth once daily 90 tablet 0   Fluticasone-Salmeterol (ADVAIR) 250-50  MCG/DOSE AEPB INHALE 1 DOSE BY MOUTH TWICE DAILY 180 each 0   latanoprost (XALATAN) 0.005 % ophthalmic solution Place 1 drop into both eyes at bedtime.      omeprazole (PRILOSEC OTC) 20 MG tablet Take 20 mg by mouth daily.     rosuvastatin (CRESTOR) 10 MG tablet TAKE 1 TABLET BY MOUTH AT BEDTIME 90 tablet 2   No current facility-administered medications on file prior to visit.     Family Hx: The patient's family history includes Diabetes in her mother; Glaucoma in her mother; Berenice Primas' disease in her mother; Heart attack (age of onset: 89) in her mother; Heart disease in her mother and another family member; Lung disease in her father; Peripheral vascular disease in an other family member. There is no history of Breast cancer.  ROS:   Please see the history of present illness.    Review of Systems  Constitutional: Negative.   HENT:  Negative.   Respiratory: Negative.   Cardiovascular: Negative.   Gastrointestinal: Negative.   Musculoskeletal: Negative.   Neurological: Negative.   Psychiatric/Behavioral: Negative.   All other systems reviewed and are negative.    Labs/Other Tests and Data Reviewed:    Recent Labs: 06/20/2019: TSH 2.26 12/25/2019: ALT 14; BUN 9; Creatinine, Ser 0.88; Hemoglobin 13.7; Platelets 195.0; Potassium 4.1; Sodium 137   Recent Lipid Panel Lab Results  Component Value Date/Time   CHOL 150 12/25/2019 08:45 AM   TRIG 167.0 (H) 12/25/2019 08:45 AM   HDL 50.10 12/25/2019 08:45 AM   CHOLHDL 3 12/25/2019 08:45 AM   LDLCALC 66 12/25/2019 08:45 AM   LDLDIRECT 116.0 03/14/2017 08:12 AM    Wt Readings from Last 3 Encounters:  06/05/20 195 lb (88.5 kg)  04/14/20 197 lb (89.4 kg)  12/10/19 200 lb (90.7 kg)     Exam:    Vital Signs: Vital signs may also be detailed in the HPI Ht 5\' 6"  (1.676 m)    Wt 195 lb (88.5 kg)    LMP 06/15/1977    BMI 31.47 kg/m   Wt Readings from Last 3 Encounters:  06/05/20 195 lb (88.5 kg)  04/14/20 197 lb (89.4 kg)  12/10/19  200 lb (90.7 kg)   Temp Readings from Last 3 Encounters:  04/14/20 98.3 F (36.8 C) (Oral)  12/10/19 98.3 F (36.8 C)  11/07/18 98.5 F (36.9 C) (Oral)   BP Readings from Last 3 Encounters:  04/26/20 128/76  12/10/19 (!) 138/80  10/28/19 134/74   Pulse Readings from Last 3 Encounters:  04/14/20 82  12/10/19 84  10/28/19 83    Constitutional:  oriented to person, place, and time. No distress.    ASSESSMENT & PLAN:    Problem List Items Addressed This Visit      Cardiology Problems   Hypertension   Relevant Medications   ezetimibe (ZETIA) 10 MG tablet   metoprolol tartrate (LOPRESSOR) 25 MG tablet   Hypercholesterolemia   Relevant Medications   ezetimibe (ZETIA) 10 MG tablet   metoprolol tartrate (LOPRESSOR) 25 MG tablet   Carotid artery stenosis   Relevant Medications   ezetimibe (ZETIA) 10 MG tablet   metoprolol tartrate (LOPRESSOR) 25 MG tablet   CAD (coronary artery disease) - Primary   Relevant Medications   ezetimibe (ZETIA) 10 MG tablet   metoprolol tartrate (LOPRESSOR) 25 MG tablet    Other Visit Diagnoses    PAD (peripheral artery disease) (HCC)       Relevant Medications   ezetimibe (ZETIA) 10 MG tablet   metoprolol tartrate (LOPRESSOR) 25 MG tablet   Smoker         Cad with stable angina Currently with no symptoms of angina. No further workup at this time. Continue current medication regimen. Stay on crestor and xetoa Smoking cessation suggested  PAD Smoking cessation suggested Stay on crestor/zetia  Hyperlipidemia Cholesterol is at goal on the current lipid regimen. No changes to the medications were made.  HTN Blood pressure is well controlled on today's visit. No changes made to the medications.   COVID-19 Education: The signs and symptoms of COVID-19 were discussed with the patient and how to seek care for testing (follow up with PCP or arrange E-visit).  The importance of social distancing was discussed today.  Patient Risk:    After full review of this patients clinical status, I feel that they are at least moderate risk at this time.  Time:   Today, I have spent  25 minutes with the patient with telehealth technology discussing the cardiac and medical problems/diagnoses detailed above   Additional 10 min spent reviewing the chart prior to patient visit today   Medication Adjustments/Labs and Tests Ordered: Current medicines are reviewed at length with the patient today.  Concerns regarding medicines are outlined above.   Tests Ordered: No tests ordered   Medication Changes: No changes made    Signed, Ida Rogue, MD  [place] Office [address] [place] [address], [place], [place] [address]

## 2020-06-05 NOTE — Patient Instructions (Addendum)
Medication Instructions:  No changes  All your cardiac medications have been updated for 90 day supply for one year, please call your pharmacy when refills are needed, they may be picked up today if needed.   Lab work: No new labs needed   Testing/Procedures: No new testing needed   Follow-Up: At [facility], you and your health needs are our priority.  As part of our continuing mission to provide you with exceptional heart care, we have created designated Provider Care Teams.  These Care Teams include your primary Cardiologist (physician) and Advanced Practice Providers (APPs -  Physician Assistants and Nurse Practitioners) who all work together to provide you with the care you need, when you need it.  . You will need a follow up appointment in 12 months  . Providers on your designated Care Team:   . Murray Hodgkins, NP . Christell Faith, PA-C . Marrianne Mood, PA-C  Any Other Special Instructions Will Be Listed Below (If Applicable).  COVID-19 Vaccine Information can be found at: [email or web address] For questions related to vaccine distribution or appointments, please email [email or web address] or call 5014920984.

## 2020-07-06 ENCOUNTER — Other Ambulatory Visit: Payer: Self-pay | Admitting: Internal Medicine

## 2020-07-14 ENCOUNTER — Other Ambulatory Visit: Payer: Self-pay | Admitting: Cardiovascular Disease

## 2020-07-21 ENCOUNTER — Other Ambulatory Visit: Payer: Self-pay | Admitting: Internal Medicine

## 2020-08-03 ENCOUNTER — Other Ambulatory Visit: Payer: Self-pay | Admitting: Cardiovascular Disease

## 2020-08-03 MED ORDER — ROSUVASTATIN CALCIUM 10 MG PO TABS: 10.0000 mg | ORAL_TABLET | Freq: Every day | ORAL | 3 refills | Status: DC

## 2020-08-03 NOTE — Telephone Encounter (Signed)
Requested Prescriptions   Signed Prescriptions Disp Refills   ezetimibe (ZETIA) 10 MG tablet 90 tablet 3    Sig: Take 1 tablet by mouth once daily    Authorizing Provider: Minna Merritts    Ordering User: Eugenio Hoes, Caeleb Batalla C   rosuvastatin (CRESTOR) 10 MG tablet 90 tablet 3    Sig: Take 1 tablet (10 mg total) by mouth at bedtime.    Authorizing Provider: Minna Merritts    Ordering User: Othelia Pulling C   Pt taking Zetia 10 mg qd and request refill over the phone for Rosuvastatin 10 mg qd.

## 2020-08-11 ENCOUNTER — Other Ambulatory Visit: Payer: Self-pay

## 2020-08-11 MED ORDER — EZETIMIBE 10 MG PO TABS: 10.0000 mg | ORAL_TABLET | Freq: Every day | ORAL | 2 refills | Status: DC

## 2020-08-11 MED ORDER — CLOPIDOGREL BISULFATE 75 MG PO TABS: 75.0000 mg | ORAL_TABLET | Freq: Every day | ORAL | 2 refills | Status: DC

## 2020-08-11 MED ORDER — METOPROLOL TARTRATE 25 MG PO TABS: 25.0000 mg | ORAL_TABLET | Freq: Every day | ORAL | 2 refills | Status: DC

## 2020-08-11 MED ORDER — ROSUVASTATIN CALCIUM 10 MG PO TABS: 10.0000 mg | ORAL_TABLET | Freq: Every day | ORAL | 2 refills | Status: DC

## 2020-08-12 ENCOUNTER — Telehealth: Payer: Self-pay | Admitting: Internal Medicine

## 2020-08-12 NOTE — Telephone Encounter (Signed)
Patient called in to let know Dr.Scott know that she has changed over to mail order pharmacy

## 2020-08-12 NOTE — Telephone Encounter (Signed)
Pharmacy changed in chart

## 2020-08-13 ENCOUNTER — Ambulatory Visit (INDEPENDENT_AMBULATORY_CARE_PROVIDER_SITE_OTHER): Payer: Medicare Other | Admitting: Internal Medicine

## 2020-08-13 ENCOUNTER — Encounter: Payer: Self-pay | Admitting: Internal Medicine

## 2020-08-13 ENCOUNTER — Other Ambulatory Visit: Payer: Self-pay

## 2020-08-13 VITALS — BP 128/66 | HR 86 | Temp 98.1°F | Resp 16 | Ht 66.0 in | Wt 196.2 lb

## 2020-08-13 DIAGNOSIS — E1159 Type 2 diabetes mellitus with other circulatory complications: Secondary | ICD-10-CM | POA: Diagnosis not present

## 2020-08-13 DIAGNOSIS — D509 Iron deficiency anemia, unspecified: Secondary | ICD-10-CM

## 2020-08-13 DIAGNOSIS — K50919 Crohn's disease, unspecified, with unspecified complications: Secondary | ICD-10-CM | POA: Diagnosis not present

## 2020-08-13 DIAGNOSIS — Z853 Personal history of malignant neoplasm of breast: Secondary | ICD-10-CM | POA: Diagnosis not present

## 2020-08-13 DIAGNOSIS — N898 Other specified noninflammatory disorders of vagina: Secondary | ICD-10-CM

## 2020-08-13 DIAGNOSIS — I6523 Occlusion and stenosis of bilateral carotid arteries: Secondary | ICD-10-CM

## 2020-08-13 DIAGNOSIS — I10 Essential (primary) hypertension: Secondary | ICD-10-CM

## 2020-08-13 DIAGNOSIS — Z720 Tobacco use: Secondary | ICD-10-CM | POA: Diagnosis not present

## 2020-08-13 DIAGNOSIS — E1165 Type 2 diabetes mellitus with hyperglycemia: Secondary | ICD-10-CM | POA: Diagnosis not present

## 2020-08-13 DIAGNOSIS — E038 Other specified hypothyroidism: Secondary | ICD-10-CM

## 2020-08-13 DIAGNOSIS — I25118 Atherosclerotic heart disease of native coronary artery with other forms of angina pectoris: Secondary | ICD-10-CM

## 2020-08-13 DIAGNOSIS — L409 Psoriasis, unspecified: Secondary | ICD-10-CM

## 2020-08-13 DIAGNOSIS — K219 Gastro-esophageal reflux disease without esophagitis: Secondary | ICD-10-CM

## 2020-08-13 DIAGNOSIS — E7800 Pure hypercholesterolemia, unspecified: Secondary | ICD-10-CM | POA: Diagnosis not present

## 2020-08-13 DIAGNOSIS — G35 Multiple sclerosis: Secondary | ICD-10-CM

## 2020-08-13 LAB — HM DIABETES FOOT EXAM

## 2020-08-13 MED ORDER — NYSTATIN 100000 UNIT/GM EX CREA: 1.0000 "application " | TOPICAL_CREAM | Freq: Two times a day (BID) | CUTANEOUS | 0 refills | Status: DC

## 2020-08-13 NOTE — Progress Notes (Signed)
Patient ID: Haley Santos, female   DOB: December 11, 1945, 75 y.o.   MRN: [id number]   Subjective:    Patient ID: Haley Santos, female    DOB: 05/29/1945, 75 y.o.   MRN: [id number]  HPI This visit occurred during the SARS-CoV-2 public health emergency.  Safety protocols were in place, including screening questions prior to the visit, additional usage of staff PPE, and extensive cleaning of exam room while observing appropriate contact time as indicated for disinfecting solutions.  Patient here for a scheduled follow up. Here to follow up regarding her blood pressure and cholesterol.  Has a history of known CAD s/p STEMI 2016 with stent placed to mid RCA.  Also here to follow up regarding her blood sugar.  Just saw Dr Rockey Situ 06/05/20.  Stable.  Denies chest pain.  Breathing stable.  Continues to smoke. Discussed the need to quit.  No increased cough or congestion currently.  States last abx (augmentin) - developed rash after taking.  Eating.  No nausea or vomiting.  No acid reflux or abdominal pain reported.  Does report vaginal itching -vaginal/ peri vaginal itching.  Used monistat.  Wanted a different topical cream.  Prescribed terazol.  Has not used.  Wears pads.     Past Medical History:  Diagnosis Date  . Asthma with bronchitis   . Breast cancer ([place]) 2014  . CAD (coronary artery disease)    a. 08/2014: inf STEMI s/p DES to RCA.   Marland Kitchen COPD (chronic obstructive pulmonary disease) ([place])   . Crohn's disease ([place])   . Dyspnea    DOE  . GERD (gastroesophageal reflux disease)   . Glaucoma   . Glucose intolerance (impaired glucose tolerance)    a. HgA1c 6.2 during 08/2014 admission   . Hypercholesterolemia   . Hypertension   . Hyperthyroidism    a. s/p ablation maintained on synthroid  . Hypothyroidism   . Multiple sclerosis ([place])   . Myocardial infarction ([place])    2016  . Neuromuscular disorder ([place])    MS  . Tobacco abuse    Past Surgical History:  Procedure Laterality Date  .  AUGMENTATION MAMMAPLASTY Left   . [place]  . CATARACT EXTRACTION W/PHACO Left 07/19/2017   Procedure: CATARACT EXTRACTION PHACO AND INTRAOCULAR LENS PLACEMENT (IOC);  Surgeon: Birder Robson, MD;  Location: [facility] ORS;  Service: Ophthalmology;  Laterality: Left;  [place] 00:49.0 AP% 14.5 CDE 7.10 Fluid Pack Lot # [id number]  . CATARACT EXTRACTION W/PHACO Right 08/09/2017   Procedure: CATARACT EXTRACTION PHACO AND INTRAOCULAR LENS PLACEMENT ([place]);  Surgeon: Birder Robson, MD;  Location: [facility] ORS;  Service: Ophthalmology;  Laterality: Right;  [place] 00:42 AP% 13.8 CDE 5.88 Fluid pack lot #[id number]  . COLONOSCOPY  2010   Dr Vira Agar  . CORONARY ANGIOPLASTY     STENT 2016  . LEFT HEART CATHETERIZATION WITH CORONARY ANGIOGRAM N/A 09/01/2014   Procedure: LEFT HEART CATHETERIZATION WITH CORONARY ANGIOGRAM;  Surgeon: Peter M Martinique, MD;  [facility];  Service: Cardiovascular;  Laterality: N/A;  . MASTECTOMY Right 2014   PATRICAL MASTECOMY RIGHT RAD CHEMO  . PORTACATH PLACEMENT  11/07/2012  . RIGHT OOPHORECTOMY  1979  . TONSILLECTOMY AND ADENOIDECTOMY  1957   Family History  Problem Relation Age of Onset  . Diabetes Mother   . Heart disease Mother        myocardial infarction  . Glaucoma Mother   . Graves' disease Mother   . Heart  attack Mother 26  . Lung disease Father   . Heart disease Other        half sister  . Peripheral vascular disease Other        half sister  . Breast cancer Neg Hx    Social History   Socioeconomic History  . Marital status: Divorced    Spouse name: Not on file  . Number of children: 1  . Years of education: Not on file  . Highest education level: Not on file  Occupational History  . Not on file  Tobacco Use  . Smoking status: Current Every Day Smoker    Packs/day: 1.00    Years: 40.00    Pack years: 40.00    Last attempt to quit: 07/01/2015    Years since quitting: 5.1  . Smokeless tobacco: Never Used   Vaping Use  . Vaping Use: Never used  Substance and Sexual Activity  . Alcohol use: Yes    Alcohol/week: 0.0 standard drinks    Comment: RARE  . Drug use: No  . Sexual activity: Not on file  Other Topics Concern  . Not on file  Social History Narrative  . Not on file   Social Determinants of Health   Financial Resource Strain: Not on file  Food Insecurity: Not on file  Transportation Needs: Not on file  Physical Activity: Not on file  Stress: Not on file  Social Connections: Not on file    Outpatient Encounter Medications as of 08/13/2020  Medication Sig  . nystatin cream (MYCOSTATIN) Apply 1 application topically 2 (two) times daily.  Marland Kitchen albuterol (VENTOLIN HFA) 108 (90 Base) MCG/ACT inhaler Inhale into the lungs as needed for wheezing or shortness of breath.  . clopidogrel (PLAVIX) 75 MG tablet Take 1 tablet (75 mg total) by mouth daily.  Arna Medici 112 MCG tablet Take 1 tablet by mouth once daily  . ezetimibe (ZETIA) 10 MG tablet Take 1 tablet (10 mg total) by mouth daily.  . Fluticasone-Salmeterol (ADVAIR) 250-50 MCG/DOSE AEPB INHALE 1 DOSE BY MOUTH TWICE DAILY  . latanoprost (XALATAN) 0.005 % ophthalmic solution Place 1 drop into both eyes at bedtime.   . metoprolol tartrate (LOPRESSOR) 25 MG tablet Take 1 tablet (25 mg total) by mouth daily.  Marland Kitchen omeprazole (PRILOSEC OTC) 20 MG tablet Take 20 mg by mouth daily.  . rosuvastatin (CRESTOR) 10 MG tablet Take 1 tablet (10 mg total) by mouth at bedtime.   No facility-administered encounter medications on file as of 08/13/2020.    Review of Systems  Constitutional: Negative for appetite change and unexpected weight change.  HENT: Negative for congestion and sinus pressure.   Respiratory: Negative for cough and chest tightness.        Breathing stable. No sob.   Cardiovascular: Negative for chest pain, palpitations and leg swelling.  Gastrointestinal: Negative for abdominal pain, diarrhea, nausea and vomiting.  Genitourinary:  Negative for difficulty urinating and dysuria.       Vaginal irritation as outlined.   Musculoskeletal: Negative for joint swelling and myalgias.  Skin: Negative for color change and rash.  Neurological: Negative for dizziness, light-headedness and headaches.  Psychiatric/Behavioral: Negative for agitation and dysphoric mood.       Objective:    Physical Exam Vitals reviewed.  Constitutional:      General: She is not in acute distress.    Appearance: Normal appearance.  HENT:     Head: Normocephalic and atraumatic.     Right Ear: External ear  normal.     Left Ear: External ear normal.  Eyes:     General: No scleral icterus.       Right eye: No discharge.        Left eye: No discharge.     Conjunctiva/sclera: Conjunctivae normal.  Neck:     Thyroid: No thyromegaly.  Cardiovascular:     Rate and Rhythm: Normal rate and regular rhythm.  Pulmonary:     Effort: No respiratory distress.     Breath sounds: Normal breath sounds. No wheezing.  Abdominal:     General: Bowel sounds are normal.     Palpations: Abdomen is soft.     Tenderness: There is no abdominal tenderness.  Genitourinary:    Comments: She declined.  Musculoskeletal:        General: No swelling or tenderness.     Cervical back: Neck supple. No tenderness.  Lymphadenopathy:     Cervical: No cervical adenopathy.  Skin:    Findings: No erythema or rash.  Neurological:     Mental Status: She is alert.  Psychiatric:        Mood and Affect: Mood normal.        Behavior: Behavior normal.     BP 128/66   Pulse 86   Temp 98.1 F (36.7 C) (Oral)   Resp 16   Ht 5' 6"  (1.676 m)   Wt 196 lb 3.2 oz (89 kg)   LMP 06/15/1977   SpO2 97%   BMI 31.67 kg/m  Wt Readings from Last 3 Encounters:  08/13/20 196 lb 3.2 oz (89 kg)  06/05/20 195 lb (88.5 kg)  04/14/20 197 lb (89.4 kg)     Lab Results  Component Value Date   WBC 7.9 08/13/2020   HGB 13.5 08/13/2020   HCT 40.0 08/13/2020   PLT 199.0 08/13/2020    GLUCOSE 115 (H) 08/13/2020   CHOL 159 08/13/2020   TRIG 165.0 (H) 08/13/2020   HDL 39.30 08/13/2020   LDLDIRECT 116.0 03/14/2017   LDLCALC 87 08/13/2020   ALT 38 (H) 08/13/2020   AST 48 (H) 08/13/2020   NA 139 08/13/2020   K 4.4 08/13/2020   CL 102 08/13/2020   CREATININE 0.81 08/13/2020   BUN 8 08/13/2020   CO2 29 08/13/2020   TSH 0.26 (L) 08/13/2020   HGBA1C 7.3 (H) 08/13/2020   MICROALBUR 2.4 (H) 08/13/2020    MM 3D SCREEN BREAST W/IMPLANT BILATERAL  Result Date: 02/13/2019 CLINICAL DATA:  Screening. History of a partial right mastectomy. Residual right breast tissue was imaged. EXAM: DIGITAL SCREENING BILATERAL MAMMOGRAM WITH IMPLANTS, CAD AND TOMO The patient has a left prepectoral implant. Standard and implant displaced views were performed. COMPARISON:  Previous exam(s). ACR Breast Density Category c: The breast tissue is heterogeneously dense, which may obscure small masses. FINDINGS: There are no findings suspicious for malignancy. Stable post lumpectomy changes on right. Images were processed with CAD. IMPRESSION: No mammographic evidence of malignancy. A result letter of this screening mammogram will be mailed directly to the patient. RECOMMENDATION: Screening mammogram in one year. (Code:SM-B-01Y) BI-RADS CATEGORY  1:  Negative. Electronically Signed   By: Lajean Manes M.D.   On: 02/13/2019 16:22       Assessment & Plan:   Problem List Items Addressed This Visit    Anemia    Follow cbc.       Relevant Orders   CBC with Differential/Platelet (Completed)   IBC + Ferritin (Completed)   CAD (coronary artery disease)  Just evaluated by Dr Rockey Situ.  Stable.  Continue plavix, crestor, zetia and metoprolol.        Carotid artery disease ([place])    Has been followed by [facility] - 40-59% bilaterally.  Last seen 10/2019. Recommended f/u in one year.       Crohn's disease ([place])    Has declined colonoscopy.  Has seen GI. Bowels stable.       Diabetes mellitus ([place])    Relevant Orders   Hemoglobin A1c (Completed)   Microalbumin / creatinine urine ratio (Completed)   GERD (gastroesophageal reflux disease)    No upper symptoms reported.  Omeprazole.        History of breast cancer    Mammogram 02/13/19 - Birads I. Overdue. Discussed.  She will schedule when ready.  Has declined f/u with oncology.       Hypercholesterolemia - Primary    On crestor and zetia.  Low cholesterol diet and exercise.  Follow lipid panel and liver function tests.      Relevant Orders   Hepatic function panel (Completed)   Lipid panel (Completed)   Hypertension    Continue metoprolol.  Blood pressure as outlined.  No changes.  Follow pressure. Follow metabolic panel.       Relevant Orders   TSH (Completed)   Basic metabolic panel (Completed)   Hypothyroidism    On thyroid replacement.  Follow tsh.       Multiple sclerosis ([place])    Desires no further intervention.  Follow.       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HCC)    Low carb diet and exercise.  Has declined medication.  Recheck met b and a1c.        Psoriasis    Previously followed by dermatology. Declined vaginal exam.       Tobacco abuse    Discussed the need to quit smoking.  Needs to quit.  Declines.  Follow.       Vaginal itching    Persistent itching.  terazol as directed.  Notify me if persistent.  Declined pelvic exam.           Einar Pheasant, MD

## 2020-08-14 LAB — HEPATIC FUNCTION PANEL
ALT: 38 U/L — ABNORMAL HIGH (ref 0–35)
AST: 48 U/L — ABNORMAL HIGH (ref 0–37)
Albumin: 3.9 g/dL (ref 3.5–5.2)
Alkaline Phosphatase: 133 U/L — ABNORMAL HIGH (ref 39–117)
Bilirubin, Direct: 0.2 mg/dL (ref 0.0–0.3)
Total Bilirubin: 0.7 mg/dL (ref 0.2–1.2)
Total Protein: 6.7 g/dL (ref 6.0–8.3)

## 2020-08-14 LAB — CBC WITH DIFFERENTIAL/PLATELET
Basophils Absolute: 0.1 10*3/uL (ref 0.0–0.1)
Basophils Relative: 0.7 % (ref 0.0–3.0)
Eosinophils Absolute: 0.4 10*3/uL (ref 0.0–0.7)
Eosinophils Relative: 5.1 % — ABNORMAL HIGH (ref 0.0–5.0)
HCT: 40 % (ref 36.0–46.0)
Hemoglobin: 13.5 g/dL (ref 12.0–15.0)
Lymphocytes Relative: 16 % (ref 12.0–46.0)
Lymphs Abs: 1.3 10*3/uL (ref 0.7–4.0)
MCHC: 33.8 g/dL (ref 30.0–36.0)
MCV: 80.3 fl (ref 78.0–100.0)
Monocytes Absolute: 0.4 10*3/uL (ref 0.1–1.0)
Monocytes Relative: 5.4 % (ref 3.0–12.0)
Neutro Abs: 5.8 10*3/uL (ref 1.4–7.7)
Neutrophils Relative %: 72.8 % (ref 43.0–77.0)
Platelets: 199 10*3/uL (ref 150.0–400.0)
RBC: 4.98 Mil/uL (ref 3.87–5.11)
RDW: 14.7 % (ref 11.5–15.5)
WBC: 7.9 10*3/uL (ref 4.0–10.5)

## 2020-08-14 LAB — LIPID PANEL
Cholesterol: 159 mg/dL (ref 0–200)
HDL: 39.3 mg/dL (ref >39.00)
LDL Cholesterol: 87 mg/dL (ref 0–99)
NonHDL: 119.65
Total CHOL/HDL Ratio: 4
Triglycerides: 165 mg/dL — ABNORMAL HIGH (ref 0.0–149.0)
VLDL: 33 mg/dL (ref 0.0–40.0)

## 2020-08-14 LAB — BASIC METABOLIC PANEL
BUN: 8 mg/dL (ref 6–23)
CO2: 29 mEq/L (ref 19–32)
Calcium: 9.3 mg/dL (ref 8.4–10.5)
Chloride: 102 mEq/L (ref 96–112)
Creatinine, Ser: 0.81 mg/dL (ref 0.40–1.20)
GFR: 72.38 mL/min (ref >60.00)
Glucose, Bld: 115 mg/dL — ABNORMAL HIGH (ref 70–99)
Potassium: 4.4 mEq/L (ref 3.5–5.1)
Sodium: 139 mEq/L (ref 135–145)

## 2020-08-14 LAB — MICROALBUMIN / CREATININE URINE RATIO
Creatinine,U: 115 mg/dL
Microalb Creat Ratio: 2.1 mg/g (ref 0.0–30.0)
Microalb, Ur: 2.4 mg/dL — ABNORMAL HIGH (ref 0.0–1.9)

## 2020-08-14 LAB — IBC + FERRITIN
Ferritin: 21.6 ng/mL (ref 10.0–291.0)
Iron: 118 ug/dL (ref 42–145)
Saturation Ratios: 25.5 % (ref 20.0–50.0)
Transferrin: 330 mg/dL (ref 212.0–360.0)

## 2020-08-14 LAB — TSH: TSH: 0.26 u[IU]/mL — ABNORMAL LOW (ref 0.35–4.50)

## 2020-08-14 LAB — HEMOGLOBIN A1C: Hgb A1c MFr Bld: 7.3 % — ABNORMAL HIGH (ref 4.6–6.5)

## 2020-08-15 ENCOUNTER — Encounter: Payer: Self-pay | Admitting: Internal Medicine

## 2020-08-15 DIAGNOSIS — N898 Other specified noninflammatory disorders of vagina: Secondary | ICD-10-CM | POA: Insufficient documentation

## 2020-08-15 NOTE — Assessment & Plan Note (Signed)
Just evaluated by Dr Rockey Situ.  Stable.  Continue plavix, crestor, zetia and metoprolol.

## 2020-08-15 NOTE — Assessment & Plan Note (Signed)
Low carb diet and exercise.  Has declined medication.  Recheck met b and a1c.

## 2020-08-15 NOTE — Assessment & Plan Note (Signed)
Has declined colonoscopy.  Has seen GI. Bowels stable.

## 2020-08-15 NOTE — Assessment & Plan Note (Signed)
Desires no further intervention.  Follow.

## 2020-08-15 NOTE — Assessment & Plan Note (Signed)
No upper symptoms reported.  Omeprazole.

## 2020-08-15 NOTE — Assessment & Plan Note (Signed)
Discussed the need to quit smoking.  Needs to quit.  Declines.  Follow.

## 2020-08-15 NOTE — Assessment & Plan Note (Signed)
Has been followed by [facility] - 40-59% bilaterally.  Last seen 10/2019. Recommended f/u in one year.

## 2020-08-15 NOTE — Assessment & Plan Note (Signed)
Mammogram 02/13/19 - Birads I. Overdue. Discussed.  She will schedule when ready.  Has declined f/u with oncology.

## 2020-08-15 NOTE — Assessment & Plan Note (Signed)
On crestor and zetia.  Low cholesterol diet and exercise.  Follow lipid panel and liver function tests.

## 2020-08-15 NOTE — Assessment & Plan Note (Signed)
Previously followed by dermatology. Declined vaginal exam.

## 2020-08-15 NOTE — Assessment & Plan Note (Signed)
Persistent itching.  terazol as directed.  Notify me if persistent.  Declined pelvic exam.

## 2020-08-15 NOTE — Assessment & Plan Note (Signed)
On thyroid replacement.  Follow tsh.  

## 2020-08-15 NOTE — Assessment & Plan Note (Signed)
Continue metoprolol.  Blood pressure as outlined.  No changes.  Follow pressure. Follow metabolic panel.

## 2020-08-15 NOTE — Assessment & Plan Note (Signed)
Follow cbc.  

## 2020-08-16 ENCOUNTER — Other Ambulatory Visit: Payer: Self-pay | Admitting: Internal Medicine

## 2020-08-16 DIAGNOSIS — R945 Abnormal results of liver function studies: Secondary | ICD-10-CM

## 2020-08-16 DIAGNOSIS — R7989 Other specified abnormal findings of blood chemistry: Secondary | ICD-10-CM

## 2020-08-16 NOTE — Progress Notes (Signed)
Orders placed for f/u labs.  

## 2020-08-19 ENCOUNTER — Other Ambulatory Visit: Payer: Self-pay

## 2020-08-19 MED ORDER — LEVOTHYROXINE SODIUM 100 MCG PO TABS: 100.0000 ug | ORAL_TABLET | Freq: Every day | ORAL | 0 refills | Status: DC

## 2020-08-20 ENCOUNTER — Other Ambulatory Visit (INDEPENDENT_AMBULATORY_CARE_PROVIDER_SITE_OTHER): Payer: Medicare Other

## 2020-08-20 ENCOUNTER — Other Ambulatory Visit: Payer: Self-pay

## 2020-08-20 DIAGNOSIS — R7989 Other specified abnormal findings of blood chemistry: Secondary | ICD-10-CM

## 2020-08-20 DIAGNOSIS — R945 Abnormal results of liver function studies: Secondary | ICD-10-CM | POA: Diagnosis not present

## 2020-08-21 LAB — HEPATIC FUNCTION PANEL
ALT: 44 U/L — ABNORMAL HIGH (ref 0–35)
AST: 49 U/L — ABNORMAL HIGH (ref 0–37)
Albumin: 4 g/dL (ref 3.5–5.2)
Alkaline Phosphatase: 148 U/L — ABNORMAL HIGH (ref 39–117)
Bilirubin, Direct: 0.2 mg/dL (ref 0.0–0.3)
Total Bilirubin: 0.7 mg/dL (ref 0.2–1.2)
Total Protein: 6.9 g/dL (ref 6.0–8.3)

## 2020-08-21 LAB — HEPATITIS C ANTIBODY
Hepatitis C Ab: NONREACTIVE
SIGNAL TO CUT-OFF: 0.01 (ref <1.00)

## 2020-08-21 LAB — HEPATITIS B E ANTIGEN: Hep B E Ag: NONREACTIVE

## 2020-08-21 LAB — HEPATITIS B CORE ANTIBODY, TOTAL: Hep B Core Total Ab: NONREACTIVE

## 2020-08-21 LAB — HEPATITIS B SURFACE ANTIGEN: Hepatitis B Surface Ag: NONREACTIVE

## 2020-08-21 LAB — HEPATITIS B CORE ANTIBODY, IGM: Hep B C IgM: NONREACTIVE

## 2020-08-21 LAB — HEPATITIS B SURFACE ANTIBODY,QUALITATIVE: Hep B S Ab: NONREACTIVE

## 2020-08-26 ENCOUNTER — Other Ambulatory Visit: Payer: Self-pay | Admitting: Internal Medicine

## 2020-08-26 ENCOUNTER — Telehealth: Payer: Self-pay | Admitting: Internal Medicine

## 2020-08-26 DIAGNOSIS — R945 Abnormal results of liver function studies: Secondary | ICD-10-CM

## 2020-08-26 DIAGNOSIS — R7989 Other specified abnormal findings of blood chemistry: Secondary | ICD-10-CM

## 2020-08-26 NOTE — Telephone Encounter (Signed)
See result note.  

## 2020-08-26 NOTE — Progress Notes (Signed)
Order placed for abdominal ultrasound

## 2020-08-26 NOTE — Telephone Encounter (Signed)
lft vm for pt to call ofc to sch US abdomen.thanks

## 2020-08-26 NOTE — Telephone Encounter (Signed)
Pt called returning a call about lab results

## 2020-08-26 NOTE — Telephone Encounter (Signed)
Pt called back returning your call Pt would like you to leave a message with her lab results if she doesn't pick up

## 2020-09-19 ENCOUNTER — Other Ambulatory Visit: Payer: Self-pay | Admitting: Internal Medicine

## 2020-09-21 NOTE — Telephone Encounter (Signed)
Pt called and needs this medication sent to [facility] on [address]! Thanks!

## 2020-09-28 ENCOUNTER — Ambulatory Visit
Admission: RE | Admit: 2020-09-28 | Discharge: 2020-09-28 | Disposition: A | Discharge status: Home / Self Care | Payer: Medicare Other | Source: Ambulatory Visit | Attending: Internal Medicine | Admitting: Internal Medicine

## 2020-09-28 ENCOUNTER — Other Ambulatory Visit: Payer: Self-pay

## 2020-09-28 DIAGNOSIS — R945 Abnormal results of liver function studies: Secondary | ICD-10-CM | POA: Insufficient documentation

## 2020-09-28 DIAGNOSIS — R7989 Other specified abnormal findings of blood chemistry: Secondary | ICD-10-CM

## 2020-09-28 DIAGNOSIS — K7689 Other specified diseases of liver: Secondary | ICD-10-CM | POA: Diagnosis not present

## 2020-09-28 DIAGNOSIS — K760 Fatty (change of) liver, not elsewhere classified: Secondary | ICD-10-CM | POA: Diagnosis not present

## 2020-09-30 ENCOUNTER — Other Ambulatory Visit: Payer: Self-pay | Admitting: Internal Medicine

## 2020-09-30 DIAGNOSIS — R7989 Other specified abnormal findings of blood chemistry: Secondary | ICD-10-CM

## 2020-09-30 DIAGNOSIS — R945 Abnormal results of liver function studies: Secondary | ICD-10-CM

## 2020-09-30 NOTE — Progress Notes (Signed)
Order placed for f/u labs.  

## 2020-10-01 ENCOUNTER — Other Ambulatory Visit: Payer: Self-pay

## 2020-10-01 MED ORDER — LEVOTHYROXINE SODIUM 100 MCG PO TABS: 100.0000 ug | ORAL_TABLET | Freq: Every day | ORAL | 0 refills | Status: DC

## 2020-10-01 MED ORDER — FLUTICASONE-SALMETEROL 250-50 MCG/ACT IN AEPB: INHALATION_SPRAY | RESPIRATORY_TRACT | 0 refills | Status: DC

## 2020-10-01 MED ORDER — VENTOLIN HFA 108 (90 BASE) MCG/ACT IN AERS: INHALATION_SPRAY | RESPIRATORY_TRACT | 0 refills | Status: DC

## 2020-10-02 ENCOUNTER — Other Ambulatory Visit (INDEPENDENT_AMBULATORY_CARE_PROVIDER_SITE_OTHER): Payer: Medicare Other

## 2020-10-02 ENCOUNTER — Other Ambulatory Visit: Payer: Self-pay

## 2020-10-02 ENCOUNTER — Other Ambulatory Visit: Payer: Self-pay | Admitting: Internal Medicine

## 2020-10-02 DIAGNOSIS — R7989 Other specified abnormal findings of blood chemistry: Secondary | ICD-10-CM

## 2020-10-02 DIAGNOSIS — R945 Abnormal results of liver function studies: Secondary | ICD-10-CM

## 2020-10-02 DIAGNOSIS — R935 Abnormal findings on diagnostic imaging of other abdominal regions, including retroperitoneum: Secondary | ICD-10-CM

## 2020-10-02 NOTE — Progress Notes (Signed)
Order placed for GI referral.   

## 2020-10-03 LAB — HEPATIC FUNCTION PANEL
AG Ratio: 1.4 (calc) (ref 1.0–2.5)
ALT: 46 U/L — ABNORMAL HIGH (ref 6–29)
AST: 67 U/L — ABNORMAL HIGH (ref 10–35)
Albumin: 3.8 g/dL (ref 3.6–5.1)
Alkaline phosphatase (APISO): 158 U/L — ABNORMAL HIGH (ref 37–153)
Bilirubin, Direct: 0.2 mg/dL (ref 0.0–0.2)
Globulin: 2.8 g/dL (calc) (ref 1.9–3.7)
Indirect Bilirubin: 0.4 mg/dL (calc) (ref 0.2–1.2)
Total Bilirubin: 0.6 mg/dL (ref 0.2–1.2)
Total Protein: 6.6 g/dL (ref 6.1–8.1)

## 2020-10-09 ENCOUNTER — Telehealth: Payer: Self-pay | Admitting: Internal Medicine

## 2020-10-09 ENCOUNTER — Telehealth: Payer: Self-pay | Admitting: Cardiovascular Disease

## 2020-10-09 NOTE — Telephone Encounter (Signed)
Spoke with patient and she reports that she has had some bad liver numbers and was concerned about this. Reports that her primary care provider stopped her cholesterol medications due to these concerns. She was on antibiotics periodically as well. After last antibiotic 2 days after stopping those she had itchy rash, turned dark brown, and then dried up and peeled off. After that she started having abnormal liver tests. She was then referred to GI and they are not able to see her until August. She is just scared about waiting and holding her cholesterol medications until August. I did call over to GI to see if they could see her sooner but they do not have anything. They did offer to place her on waiting list. Advised that I would send over to Dr. Rockey Situ

## 2020-10-09 NOTE — Telephone Encounter (Signed)
Ok  if possible.  Just let them know if pt prefers earlier appt.  Thanks

## 2020-10-09 NOTE — Telephone Encounter (Signed)
Nothing before 8/1 but she is on a cancellation list,

## 2020-10-09 NOTE — Telephone Encounter (Signed)
Pt c/o medication issue:  1. Name of Medication: zetia and rosuvastatin   2. How are you currently taking this medication (dosage and times per day)? Dc by pcp since last week   3. Are you having a reaction (difficulty breathing--STAT)? no  4. What is your medication issue?  Pcp dc meds after abnormal liver function results.  Patient was told to hold until consult with gi .  Patient states she is not sure if reason was for liver function change was abx she took fall 2021  or if caused by cholesterol meds.    Patient is concerned that she will have cardiac issues with holding medications.  Patient wants Dr. Rockey Situ to be aware and to advise.

## 2020-10-09 NOTE — Telephone Encounter (Signed)
Do you want me to call and see if I can get her in sooner?

## 2020-10-09 NOTE — Telephone Encounter (Signed)
Pt called and said that her appt with [facility] isn't until August. She said that she can't be off statin medication for that long. Also she doesn't want to see a PA she wants to see an MD

## 2020-10-14 ENCOUNTER — Telehealth: Payer: Self-pay | Admitting: Internal Medicine

## 2020-10-14 ENCOUNTER — Other Ambulatory Visit: Payer: Self-pay

## 2020-10-14 ENCOUNTER — Other Ambulatory Visit (INDEPENDENT_AMBULATORY_CARE_PROVIDER_SITE_OTHER): Payer: Medicare Other

## 2020-10-14 DIAGNOSIS — E7800 Pure hypercholesterolemia, unspecified: Secondary | ICD-10-CM | POA: Diagnosis not present

## 2020-10-14 DIAGNOSIS — E1165 Type 2 diabetes mellitus with hyperglycemia: Secondary | ICD-10-CM | POA: Diagnosis not present

## 2020-10-14 DIAGNOSIS — E1159 Type 2 diabetes mellitus with other circulatory complications: Secondary | ICD-10-CM

## 2020-10-14 NOTE — Telephone Encounter (Signed)
Patient need refill for nystatin cream (MYCOSTATIN)

## 2020-10-14 NOTE — Telephone Encounter (Signed)
Left message for patient

## 2020-10-15 LAB — HEMOGLOBIN A1C: Hgb A1c MFr Bld: 7 % — ABNORMAL HIGH (ref 4.6–6.5)

## 2020-10-15 MED ORDER — NYSTATIN 100000 UNIT/GM EX CREA: 1.0000 "application " | TOPICAL_CREAM | Freq: Two times a day (BID) | CUTANEOUS | 0 refills | Status: DC

## 2020-10-16 LAB — LIPID PANEL
Cholesterol: 216 mg/dL — ABNORMAL HIGH (ref 0–200)
HDL: 35.7 mg/dL — ABNORMAL LOW (ref >39.00)
NonHDL: 179.95
Total CHOL/HDL Ratio: 6
Triglycerides: 223 mg/dL — ABNORMAL HIGH (ref 0.0–149.0)
VLDL: 44.6 mg/dL — ABNORMAL HIGH (ref 0.0–40.0)

## 2020-10-16 LAB — BASIC METABOLIC PANEL
BUN: 8 mg/dL (ref 6–23)
CO2: 23 mEq/L (ref 19–32)
Calcium: 9.3 mg/dL (ref 8.4–10.5)
Chloride: 102 mEq/L (ref 96–112)
Creatinine, Ser: 0.83 mg/dL (ref 0.40–1.20)
GFR: 70.21 mL/min (ref >60.00)
Glucose, Bld: 190 mg/dL — ABNORMAL HIGH (ref 70–99)
Potassium: 4.3 mEq/L (ref 3.5–5.1)
Sodium: 141 mEq/L (ref 135–145)

## 2020-10-16 LAB — HEPATIC FUNCTION PANEL
ALT: 33 U/L (ref 0–35)
AST: 48 U/L — ABNORMAL HIGH (ref 0–37)
Albumin: 3.8 g/dL (ref 3.5–5.2)
Alkaline Phosphatase: 147 U/L — ABNORMAL HIGH (ref 39–117)
Bilirubin, Direct: 0.1 mg/dL (ref 0.0–0.3)
Total Bilirubin: 0.5 mg/dL (ref 0.2–1.2)
Total Protein: 6.6 g/dL (ref 6.0–8.3)

## 2020-10-16 LAB — LDL CHOLESTEROL, DIRECT: Direct LDL: 144 mg/dL

## 2020-10-18 ENCOUNTER — Other Ambulatory Visit: Payer: Self-pay | Admitting: Internal Medicine

## 2020-10-18 DIAGNOSIS — R945 Abnormal results of liver function studies: Secondary | ICD-10-CM

## 2020-10-18 DIAGNOSIS — R7989 Other specified abnormal findings of blood chemistry: Secondary | ICD-10-CM

## 2020-10-18 NOTE — Progress Notes (Signed)
Order placed for f/u lab.   

## 2020-10-19 ENCOUNTER — Telehealth: Payer: Self-pay

## 2020-10-19 DIAGNOSIS — R7989 Other specified abnormal findings of blood chemistry: Secondary | ICD-10-CM | POA: Diagnosis not present

## 2020-10-19 DIAGNOSIS — K760 Fatty (change of) liver, not elsewhere classified: Secondary | ICD-10-CM | POA: Diagnosis not present

## 2020-10-19 NOTE — Telephone Encounter (Signed)
Pt returned your call about labs

## 2020-10-19 NOTE — Telephone Encounter (Signed)
Left message to call back for lab results.

## 2020-10-20 ENCOUNTER — Other Ambulatory Visit: Payer: Self-pay | Admitting: Internal Medicine

## 2020-10-20 DIAGNOSIS — R945 Abnormal results of liver function studies: Secondary | ICD-10-CM

## 2020-10-20 DIAGNOSIS — R7989 Other specified abnormal findings of blood chemistry: Secondary | ICD-10-CM

## 2020-10-20 NOTE — Telephone Encounter (Signed)
See result notes. 

## 2020-10-20 NOTE — Progress Notes (Signed)
Order placed for f/u labs.  

## 2020-10-21 DIAGNOSIS — H401131 Primary open-angle glaucoma, bilateral, mild stage: Secondary | ICD-10-CM | POA: Diagnosis not present

## 2020-10-26 ENCOUNTER — Encounter (INDEPENDENT_AMBULATORY_CARE_PROVIDER_SITE_OTHER): Payer: Medicare Other

## 2020-10-26 ENCOUNTER — Ambulatory Visit (INDEPENDENT_AMBULATORY_CARE_PROVIDER_SITE_OTHER): Payer: Medicare Other | Admitting: Vascular Surgery

## 2020-10-26 ENCOUNTER — Encounter (INDEPENDENT_AMBULATORY_CARE_PROVIDER_SITE_OTHER): Payer: Self-pay | Admitting: Vascular Surgery

## 2020-10-26 ENCOUNTER — Telehealth (INDEPENDENT_AMBULATORY_CARE_PROVIDER_SITE_OTHER): Payer: Self-pay

## 2020-10-26 NOTE — Telephone Encounter (Signed)
Pt called and left a VM on the nurses line saying she is sick and wont be able to come to her appointment for imaging at 2:00 PM or 3:00 PM to see the provider.

## 2020-10-27 ENCOUNTER — Other Ambulatory Visit: Payer: Medicare Other

## 2020-11-10 ENCOUNTER — Other Ambulatory Visit: Payer: Self-pay

## 2020-11-10 ENCOUNTER — Ambulatory Visit (INDEPENDENT_AMBULATORY_CARE_PROVIDER_SITE_OTHER): Payer: Medicare Other | Admitting: Cardiovascular Disease

## 2020-11-10 ENCOUNTER — Encounter: Payer: Self-pay | Admitting: Cardiovascular Disease

## 2020-11-10 VITALS — BP 108/50 | HR 85 | Ht 66.0 in | Wt 177.0 lb

## 2020-11-10 DIAGNOSIS — I25118 Atherosclerotic heart disease of native coronary artery with other forms of angina pectoris: Secondary | ICD-10-CM

## 2020-11-10 DIAGNOSIS — I6523 Occlusion and stenosis of bilateral carotid arteries: Secondary | ICD-10-CM | POA: Diagnosis not present

## 2020-11-10 DIAGNOSIS — I10 Essential (primary) hypertension: Secondary | ICD-10-CM | POA: Diagnosis not present

## 2020-11-10 DIAGNOSIS — K50018 Crohn's disease of small intestine with other complication: Secondary | ICD-10-CM

## 2020-11-10 DIAGNOSIS — I739 Peripheral vascular disease, unspecified: Secondary | ICD-10-CM | POA: Diagnosis not present

## 2020-11-10 NOTE — Patient Instructions (Addendum)
Referral to GI Dr. Allen Norris:   [phone]  Medication Instructions:  No changes, please continue your current medications   If you need a refill on your cardiac medications before your next appointment, please call your pharmacy.   Lab work: No new labs needed  Testing/Procedures: No new testing needed   Follow-Up: At [facility], you and your health needs are our priority.  As part of our continuing mission to provide you with exceptional heart care, we have created designated Provider Care Teams.  These Care Teams include your primary Cardiologist (physician) and Advanced Practice Providers (APPs -  Physician Assistants and Nurse Practitioners) who all work together to provide you with the care you need, when you need it.  You will need a follow up appointment in 6 months  Providers on your designated Care Team:   Murray Hodgkins, NP Christell Faith, PA-C Marrianne Mood, PA-C Cadence Clive, [place]  COVID-19 Vaccine Information can be found at: [email or web address] For questions related to vaccine distribution or appointments, please email vaccine@[email or web address] or call [phone].

## 2020-11-10 NOTE — Progress Notes (Signed)
Date:  11/10/2020   ID:  Haley Santos, DOB 12/29/1945, MRN [id number]  Patient Location:  [address] [place] [address]   Provider location:   [facility], [place] office  PCP:  Einar Pheasant, MD  Cardiologist:  Arvid Right [facility]   Chief Complaint  Patient presents with   Other    Patient c.o Vomiting, watery stools, Dark amber color urine, very weak, very dehydrated, hasn't ate in 3 days and abdominal pain. Patient states she has list about 20 pound in the past 6 weeks. Meds reviewed verbally with patient.     History of Present Illness:    Haley Santos is a 75 y.o. female  past medical history of obesity,  long history of smoking who continues to smoke,  Hyperlipidemia, intolerant of statins,  GERD,   hypertension   moderate carotid arterial disease on the left, 50% STEMI in 2016 with stent placed to mid RCA,  history of Crohn's Diabetes type 2 with hemoglobin A1c 7.4 who presents for routne follow-up of her coronary artery disease, hyperlipidemia   Last seen in clinic January 2021 Discussed recent GI history flare of diarrhea abdominal pain Jan 2022 one week, thinks it was for Crohn's Resolved without intervention Reports that she just eats bland food and typically will resolve  Has come back again, symptoms worse in the past several weeks Tremendous weight loss  Prior weight 203 down to 170s in past month "Cant eat", without triggering N/V, diarrhea, "all liquid" for 5 to 6 weeks Able to tolerate [employer] ice tea  "Can't tolerate colonoscopy right now" "Too sick"  In the past reports that she is typically not needed steroids but is willing to try anything at this time  Transaminitis discussed with her  Labs: A1C 7.0 Total chol 216, LDL 144 AST 67, ALK phos 158  Other past medical history reviewed  Review of her carotid ultrasound with her shows 40-59%  disease on the left   Past medical history  reviewed April 2016  cardiac catheterization, essentially occluded mid RCA with 50-70% mid LAD disease, DES placed the RCA.   History of breast cancer, Adriamycin through port in the left chest Carotid ultrasound with 50-69% carotid stenosis     Past Medical History:  Diagnosis Date   Asthma with bronchitis    Breast cancer ([place]) 2014   CAD (coronary artery disease)    a. 08/2014: inf STEMI s/p DES to RCA.    COPD (chronic obstructive pulmonary disease) (HCC)    Crohn's disease (HCC)    Dyspnea    DOE   GERD (gastroesophageal reflux disease)    Glaucoma    Glucose intolerance (impaired glucose tolerance)    a. HgA1c 6.2 during 08/2014 admission    Hypercholesterolemia    Hypertension    Hyperthyroidism    a. s/p ablation maintained on synthroid   Hypothyroidism    Multiple sclerosis ([place])    Myocardial infarction ([place])    2016   Neuromuscular disorder ([place])    MS   Tobacco abuse    Past Surgical History:  Procedure Laterality Date   AUGMENTATION MAMMAPLASTY Left    BREAST ENHANCEMENT SURGERY  1981   [place]   CATARACT EXTRACTION W/PHACO Left 07/19/2017   Procedure: CATARACT EXTRACTION PHACO AND INTRAOCULAR LENS PLACEMENT ([place]);  Surgeon: Birder Robson, MD;  Location: [facility] ORS;  Service: Ophthalmology;  Laterality: Left;  [place] 00:49.0 AP% 14.5 CDE 7.10 Fluid Pack Lot #  [id number]   CATARACT EXTRACTION W/PHACO Right 08/09/2017   Procedure: CATARACT EXTRACTION PHACO AND INTRAOCULAR LENS PLACEMENT ([facility]);  Surgeon: Birder Robson, MD;  Location: [facility] ORS;  Service: Ophthalmology;  Laterality: Right;  [place] 00:42 AP% 13.8 CDE 5.88 Fluid pack lot #[id number]   COLONOSCOPY  2010   Dr Vira Agar   CORONARY ANGIOPLASTY     STENT 2016   LEFT HEART CATHETERIZATION WITH CORONARY ANGIOGRAM N/A 09/01/2014   Procedure: LEFT HEART CATHETERIZATION WITH CORONARY ANGIOGRAM;  Surgeon: Peter M Martinique, MD;  Location: [facility] CATH LAB;  Service: Cardiovascular;  Laterality: N/A;   MASTECTOMY  Right 2014   PATRICAL MASTECOMY RIGHT RAD CHEMO   PORTACATH PLACEMENT  11/07/2012   RIGHT OOPHORECTOMY  1979   TONSILLECTOMY AND ADENOIDECTOMY  1957     Allergies:   Statins, Augmentin [amoxicillin-pot clavulanate], Clindamycin/lincomycin, Codeine, Shellfish allergy, and Valium [diazepam]   Social History   Tobacco Use   Smoking status: Every Day    Packs/day: 1.00    Years: 40.00    Pack years: 40.00    Types: Cigarettes    Last attempt to quit: 07/01/2015    Years since quitting: 5.3   Smokeless tobacco: Never  Vaping Use   Vaping Use: Never used  Substance Use Topics   Alcohol use: Yes    Alcohol/week: 0.0 standard drinks    Comment: RARE   Drug use: No     Current Outpatient Medications on File Prior to Visit  Medication Sig Dispense Refill   clopidogrel (PLAVIX) 75 MG tablet Take 1 tablet (75 mg total) by mouth daily. 90 tablet 2   fluticasone-salmeterol (ADVAIR) 250-50 MCG/ACT AEPB INHALE 1 DOSE BY MOUTH TWICE DAILY 180 each 0   latanoprost (XALATAN) 0.005 % ophthalmic solution Place 1 drop into both eyes at bedtime.      levothyroxine (EUTHYROX) 100 MCG tablet Take 1 tablet (100 mcg total) by mouth daily. 90 tablet 0   metoprolol tartrate (LOPRESSOR) 25 MG tablet Take 1 tablet (25 mg total) by mouth daily. 90 tablet 2   VENTOLIN HFA 108 (90 Base) MCG/ACT inhaler INHALE 2 PUFFS BY MOUTH EVERY 6 HOURS AS NEEDED FOR WHEEZING FOR SHORTNESS OF BREATH 54 g 0   No current facility-administered medications on file prior to visit.     Family Hx: The patient's family history includes Diabetes in her mother; Glaucoma in her mother; Berenice Primas' disease in her mother; Heart attack (age of onset: 27) in her mother; Heart disease in her mother and another family member; Lung disease in her father; Peripheral vascular disease in an other family member. There is no history of Breast cancer.  ROS:   Please see the history of present illness.    Review of Systems  Constitutional:   Positive for malaise/fatigue and weight loss.  HENT: Negative.    Respiratory: Negative.    Cardiovascular: Negative.   Gastrointestinal:  Positive for abdominal pain.  Musculoskeletal: Negative.   Neurological: Negative.   Psychiatric/Behavioral: Negative.    All other systems reviewed and are negative.   Labs/Other Tests and Data Reviewed:    Recent Labs: 08/13/2020: Hemoglobin 13.5; Platelets 199.0; TSH 0.26 10/14/2020: ALT 33; BUN 8; Creatinine, Ser 0.83; Potassium 4.3; Sodium 141   Recent Lipid Panel Lab Results  Component Value Date/Time   CHOL 216 (H) 10/14/2020 02:07 PM   TRIG 223.0 (H) 10/14/2020 02:07 PM   HDL 35.70 (L) 10/14/2020 02:07 PM   CHOLHDL 6 10/14/2020 02:07 PM   LDLCALC 87 08/13/2020 02:50  PM   LDLDIRECT 144.0 10/14/2020 02:07 PM    Wt Readings from Last 3 Encounters:  11/10/20 177 lb (80.3 kg)  08/13/20 196 lb 3.2 oz (89 kg)  06/05/20 195 lb (88.5 kg)     Exam:    Vital Signs: Vital signs may also be detailed in the HPI BP (!) 108/50 (BP Location: Right Arm, Patient Position: Sitting, Cuff Size: Normal)   Pulse 85   Ht 5' 6"  (1.676 m)   Wt 177 lb (80.3 kg)   LMP 06/15/1977   SpO2 98%   BMI 28.57 kg/m   Constitutional:  oriented to person, place, and time. No distress.  HENT:  Head: Grossly normal Eyes:  no discharge. No scleral icterus.  Neck: No JVD, no carotid bruits  Cardiovascular: Regular rate and rhythm, no murmurs appreciated Pulmonary/Chest: Clear to auscultation bilaterally, no wheezes or rails Abdominal: Soft.  no distension.  no tenderness.  Musculoskeletal: Normal range of motion Neurological:  normal muscle tone. Coordination normal. No atrophy Skin: Skin warm and dry Psychiatric: normal affect, pleasant  ASSESSMENT & PLAN:    Problem List Items Addressed This Visit       Cardiology Problems   Hypertension   Relevant Orders   EKG 12-Lead   CAD (coronary artery disease)     Other   Crohn's disease ([place]) - Primary    Relevant Orders   Ambulatory referral to Gastroenterology   Other Visit Diagnoses     PAD (peripheral artery disease) ([place])         Cad with stable angina Currently with no symptoms of angina. No further workup at this time. Continue current medication regimen. Smoking cessation suggested Off Crestor for now given transaminitis  PAD Smoking cessation suggested  Hyperlipidemia Statin on hold for now  Chronic diarrhea/Crohn's Referral made to gastroenterology Will hold off on starting steroids Recent lab work reviewed  HTN Blood pressure low, recommend she try to stay hydrated Mild orthostasis symptoms Feels that she does not need to go to the emergency room for IV fluids   Signed, Ida Rogue, MD  [facility] [id number], [place], [place] [address]

## 2020-11-12 ENCOUNTER — Encounter: Payer: Self-pay | Admitting: Internal Medicine

## 2020-11-12 ENCOUNTER — Telehealth: Payer: Self-pay | Admitting: Cardiovascular Disease

## 2020-11-12 NOTE — Telephone Encounter (Signed)
Patient calling in to make Dr.Gollan aware that Dr.Walls office cannot see her until September 6th at 2:30. Patient states she is aware there is probably not much that can be done about this but states she was hoping for sooner. Patient stated she just wanted Dr.Gollan to know she has been in touch with them

## 2020-11-19 NOTE — Telephone Encounter (Signed)
Patient's daughter , who lives in [place], is calling stating her concern for her mother. Daughters on patient's DPR. Marland Kitchen She also expresses her concern for her mothers declining health. She asks that her mom not know she is calling. Please call to discuss.

## 2020-11-19 NOTE — Telephone Encounter (Signed)
Was able to return call to pt's daughter Haley Santos (DPR approved), she reports very concern for her mother. Stated a friend of Haley Santos, Ray had called her with his concern. Haley Santos lives in [place] so cannot be there to check on her mother. She apologies for her mother coming to our office with her gastro symptoms, reports she knows her declining condition is not cardiology, but more her chronic chron's disease, but Haley Santos was hopping Dr. Rockey Situ could help her with her vomiting and diarrhea.   Advised that there was not need to apologize, was sorry Haley Santos was feeling worse then when she came in. Currently not schedule to see GI until 9/6 as a new patient. Was able to give [facility] number to GI in [place] at Dr. Patrina Levering office. Advised that if he no sooner appt to be seen ask to see another provider with a sooner open slot. Suggested their other location in [place] as well.   Educated [place] of severe dehydration can lead to weakness, dizziness, low blood pressures, and light headedness, which all can leads to falls with injury. With her having constant vomiting and diarrhea, she is at risk for these things and needs to be seen, possibly in ED for dehydration and Chron's flare. Haley Santos verbalized she agrees, however [place] she cannot get her there. She is very worried since her mother mention to Ray that "I am not going be here in a few months, I'm dying".   Advised she can make calls to 911, they can direct her to [facility] 911 communication services and ask to do a warfare check on her mother, EMS can come out to assess her then take her to the ED for evaluation. Haley Santos is thankful for this information, will see if friend [name] can have EMS to come check on [name] as he has friends in the area that work with emergency services.   Haley Santos is very thankful for the phone call, will call Dr. Tobey Grim office and have Ray continue to check on Haley Santos, will attempt to get pt to  the ED for evaluation. Otherwise all concerns were address. Agreeable to plan, will call back for anything further.

## 2020-11-19 NOTE — Telephone Encounter (Signed)
Noted. Will also forward to Dr. Rockey Situ as Juluis Rainier of pt's deteriorating condition.

## 2020-11-19 NOTE — Telephone Encounter (Signed)
Patient's friend is calling concerned that patient may not live until her appointment with Dr. Allen Norris in September. States she has lost 30 pounds in less than a month, states she is not eating at all. This gentleman is not on patient DPR, and I informed him we could not discuss any information with him. He wanted our office to know the condition she is in. He did not want the patient to know he was calling. Just FYI.

## 2020-11-23 NOTE — Telephone Encounter (Signed)
Called Haley Santos to assess current symptoms, etc.  Unable to reach.  Had to leave message.  See notes. Please call Haley Santos and confirm symptoms she is currently having, etc.

## 2020-11-24 NOTE — Telephone Encounter (Signed)
Contacted pt and scheduled for Thursday, July 21st at 2:15pm in the [facility] office with Dr. Sherri Sear.

## 2020-11-24 NOTE — Telephone Encounter (Signed)
She can come in [place] on 12/03/2020 at 2:15pm or 12/04/2020 in [place] at 8:45am or the next available appointment is 01/11/2021 in [place] at 1:00

## 2020-11-24 NOTE — Telephone Encounter (Signed)
Left message for patient to return my call.

## 2020-11-24 NOTE — Telephone Encounter (Addendum)
Patient says she is having intestinal symptoms her Crohn's , she is dehydrated and she says she has lost 30 pounds , only able to eat chicken noodle soup, she says she needs the appointment with Dr. Allen Norris and she needs to see him as soon as possible. Patient refused appointment with PCP stating she needs GI ,does not want to go to hospital and get fluids.

## 2020-12-03 ENCOUNTER — Ambulatory Visit (INDEPENDENT_AMBULATORY_CARE_PROVIDER_SITE_OTHER): Payer: Medicare Other | Admitting: Gastroenterology

## 2020-12-03 ENCOUNTER — Encounter: Payer: Self-pay | Admitting: Gastroenterology

## 2020-12-03 ENCOUNTER — Other Ambulatory Visit: Payer: Self-pay

## 2020-12-03 VITALS — BP 138/73 | HR 79 | Temp 97.7°F | Ht 66.0 in | Wt 170.2 lb

## 2020-12-03 DIAGNOSIS — K50918 Crohn's disease, unspecified, with other complication: Secondary | ICD-10-CM | POA: Diagnosis not present

## 2020-12-03 DIAGNOSIS — R634 Abnormal weight loss: Secondary | ICD-10-CM

## 2020-12-03 DIAGNOSIS — I6523 Occlusion and stenosis of bilateral carotid arteries: Secondary | ICD-10-CM

## 2020-12-03 DIAGNOSIS — R195 Other fecal abnormalities: Secondary | ICD-10-CM | POA: Diagnosis not present

## 2020-12-03 DIAGNOSIS — R1084 Generalized abdominal pain: Secondary | ICD-10-CM

## 2020-12-03 NOTE — Progress Notes (Signed)
Cephas Darby, MD [address]  [place]  [place], [place] [address]  Main: [phone]  Fax: [phone]    Gastroenterology Consultation  Referring Provider:     Minna Merritts, MD Primary Care Physician:  Einar Pheasant, MD Primary Gastroenterologist:  Dr. Cephas Darby Reason for Consultation:     Generalized abdominal discomfort, bloating, loose stools and weight loss        HPI:   Haley Santos is a 75 y.o. female referred by Dr. Einar Pheasant, MD  for consultation & management of recent history of weight loss.  Patient lost about 10 pounds unintentionally associated with generalized abdominal discomfort, nonbloody diarrhea as well as abdominal bloating.  Patient reports that she is diagnosed with Crohn's disease after her pregnancy more than 50 years ago at [facility].  She underwent several sigmoidoscopies, was part of research for management of her Crohn's.  She reports that she was on steroids for 3 to 5 years.  She has not been on any maintenance therapy.  Patient does not have anemia but has iron deficiency.  Patient lives alone, reports ongoing fatigue, is worried about unexplained weight loss.  She loves to consume sweets.  She does have diabetes.  She smokes 1 pack/day  Patient has history of coronary disease on aspirin and Plavix.  She is found to have elevated LFTs, twice the upper limit of normal.  It was thought to be secondary to statin use, discontinued by her cardiologist.  She underwent ultrasound abdomen which revealed fatty liver.  Her viral hepatitis panel is negative.  NSAIDs: None  Antiplts/Anticoagulants/Anti thrombotics: None  GI Procedures:  Colonoscopy 09/05/2005 Nonbleeding internal hemorrhoids Entire examined colon was normal  Past Medical History:  Diagnosis Date   Asthma with bronchitis    Breast cancer ([place]) 2014   CAD (coronary artery disease)    a. 08/2014: inf STEMI s/p DES to RCA.    COPD (chronic obstructive pulmonary disease)  (HCC)    Crohn's disease (HCC)    Dyspnea    DOE   GERD (gastroesophageal reflux disease)    Glaucoma    Glucose intolerance (impaired glucose tolerance)    a. HgA1c 6.2 during 08/2014 admission    Hypercholesterolemia    Hypertension    Hyperthyroidism    a. s/p ablation maintained on synthroid   Hypothyroidism    Multiple sclerosis ([place])    Myocardial infarction ([place])    2016   Neuromuscular disorder ([place])    MS   Tobacco abuse     Past Surgical History:  Procedure Laterality Date   AUGMENTATION MAMMAPLASTY Left    BREAST ENHANCEMENT SURGERY  1981   [place]   CATARACT EXTRACTION W/PHACO Left 07/19/2017   Procedure: CATARACT EXTRACTION PHACO AND INTRAOCULAR LENS PLACEMENT ([place]);  Surgeon: Birder Robson, MD;  Location: [facility] ORS;  Service: Ophthalmology;  Laterality: Left;  [place] 00:49.0 AP% 14.5 CDE 7.10 Fluid Pack Lot # [id number]   CATARACT EXTRACTION W/PHACO Right 08/09/2017   Procedure: CATARACT EXTRACTION PHACO AND INTRAOCULAR LENS PLACEMENT (IOC);  Surgeon: Birder Robson, MD;  Location: [facility] ORS;  Service: Ophthalmology;  Laterality: Right;  [place] 00:42 AP% 13.8 CDE 5.88 Fluid pack lot #[id number]   COLONOSCOPY  2010   Dr Vira Agar   CORONARY ANGIOPLASTY     STENT 2016   LEFT HEART CATHETERIZATION WITH CORONARY ANGIOGRAM N/A 09/01/2014   Procedure: LEFT HEART CATHETERIZATION WITH CORONARY ANGIOGRAM;  Surgeon: Peter M Martinique, MD;  Location: [facility] CATH LAB;  Service: Cardiovascular;  Laterality: N/A;   MASTECTOMY Right 2014   PATRICAL MASTECOMY RIGHT RAD CHEMO   PORTACATH PLACEMENT  11/07/2012   RIGHT OOPHORECTOMY  1979   TONSILLECTOMY AND ADENOIDECTOMY  1957    Current Outpatient Medications:    clopidogrel (PLAVIX) 75 MG tablet, Take 1 tablet (75 mg total) by mouth daily., Disp: 90 tablet, Rfl: 2   fluticasone-salmeterol (ADVAIR) 250-50 MCG/ACT AEPB, INHALE 1 DOSE BY MOUTH TWICE DAILY, Disp: 180 each, Rfl: 0   latanoprost (XALATAN) 0.005 % ophthalmic solution,  Place 1 drop into both eyes at bedtime. , Disp: , Rfl:    levothyroxine (EUTHYROX) 100 MCG tablet, Take 1 tablet (100 mcg total) by mouth daily., Disp: 90 tablet, Rfl: 0   metoprolol tartrate (LOPRESSOR) 25 MG tablet, Take 1 tablet (25 mg total) by mouth daily., Disp: 90 tablet, Rfl: 2   VENTOLIN HFA 108 (24 Base) MCG/ACT inhaler, INHALE 2 PUFFS BY MOUTH EVERY 6 HOURS AS NEEDED FOR WHEEZING FOR SHORTNESS OF BREATH, Disp: 54 g, Rfl: 0    Family History  Problem Relation Age of Onset   Diabetes Mother    Heart disease Mother        myocardial infarction   Glaucoma Mother    Berenice Primas' disease Mother    Heart attack Mother 70   Lung disease Father    Heart disease Other        half sister   Peripheral vascular disease Other        half sister   Breast cancer Neg Hx      Social History   Tobacco Use   Smoking status: Every Day    Packs/day: 1.00    Years: 40.00    Pack years: 40.00    Types: Cigarettes    Last attempt to quit: 07/01/2015    Years since quitting: 5.4   Smokeless tobacco: Never  Vaping Use   Vaping Use: Never used  Substance Use Topics   Alcohol use: Yes    Alcohol/week: 0.0 standard drinks    Comment: RARE   Drug use: No    Allergies as of 12/03/2020 - Review Complete 12/03/2020  Allergen Reaction Noted   Statins Other (See Comments) 09/03/2014   Augmentin [amoxicillin-pot clavulanate]  08/15/2020   Clindamycin/lincomycin Other (See Comments) 06/13/2012   Codeine Other (See Comments) 06/13/2012   Shellfish allergy Nausea And Vomiting 09/18/2012   Valium [diazepam] Anxiety and Other (See Comments) 06/13/2012    Review of Systems:    All systems reviewed and negative except where noted in HPI.   Physical Exam:  BP 138/73 (BP Location: Left Arm, Patient Position: Sitting, Cuff Size: Normal)   Pulse 79   Temp 97.7 F (36.5 C) (Oral)   Ht 5' 6"  (1.676 m)   Wt 170 lb 4 oz (77.2 kg)   LMP 06/15/1977   BMI 27.48 kg/m  Patient's last menstrual period  was 06/15/1977.  General:   Alert,  Well-developed, well-nourished, pleasant and cooperative in NAD Head:  Normocephalic and atraumatic. Eyes:  Sclera clear, no icterus.   Conjunctiva pink. Ears:  Normal auditory acuity. Nose:  No deformity, discharge, or lesions. Mouth:  No deformity or lesions,oropharynx pink & moist. Neck:  Supple; no masses or thyromegaly. Lungs:  Respirations even and unlabored.  Clear throughout to auscultation.   No wheezes, crackles, or rhonchi. No acute distress. Heart:  Regular rate and rhythm; no murmurs, clicks, rubs, or gallops. Abdomen:  Normal bowel sounds. Soft, non-tender and diffusely distended,  tympanic to percussion without masses, hepatosplenomegaly or hernias noted.  No guarding or rebound tenderness.   Rectal: Not performed Msk:  Symmetrical without gross deformities. Good, equal movement & strength bilaterally. Pulses:  Normal pulses noted. Extremities:  No clubbing or edema.  No cyanosis. Neurologic:  Alert and oriented x3;  grossly normal neurologically. Skin:  Intact without significant lesions or rashes. No jaundice. Psych:  Alert and cooperative. Normal mood and affect.  Imaging Studies: No abdominal imaging  Assessment and Plan:   Haley Santos is a 75 y.o. pleasant female with metabolic syndrome, chronic tobacco use, coronary disease on aspirin and Plavix, reported history of Crohn's disease unclear location and extent, not on maintenance therapy is seen in consultation for approximately 2 months history of generalized abdominal discomfort associated with bloating, nonbloody diarrhea as well as unintentional weight loss  Recommend CBC, CMP, iron panel, B12 and folate levels, CRP, fecal calprotectin levels, pancreatic fecal elastase levels, stool studies to rule out infection, H. pylori breath test. I have originally discussed with patient regarding bidirectional endoscopy with biopsies, patient refused to undergo these procedures at this  time as she reports that she is not mentally prepared because of worse experience she had during colonoscopy and sigmoidoscopy when she was originally diagnosed with Crohn's.  These procedures were performed at that time without anesthesia.  I tried to reassure the patient that these procedures are currently performed under general anesthesia and bowel prep is much tolerable.  She said she would like to think about it and let me know.  In the meantime, given her strong smoking history, I also suspect she may have pancreatic insufficiency, because she is symptomatic, I provided her with samples of pancreatic enzymes  Patient reports that she will call my office back to schedule procedures  Follow up in 2 to 3 months   Cephas Darby, MD

## 2020-12-03 NOTE — Patient Instructions (Signed)
Take 2 capsule of Zenpep 40,000 with first bite of each meal and 1 capsule with first bite of each snack

## 2020-12-14 ENCOUNTER — Ambulatory Visit: Payer: Medicare Other | Admitting: Internal Medicine

## 2020-12-18 ENCOUNTER — Ambulatory Visit (INDEPENDENT_AMBULATORY_CARE_PROVIDER_SITE_OTHER): Payer: Medicare Other | Admitting: Internal Medicine

## 2020-12-18 ENCOUNTER — Other Ambulatory Visit: Payer: Self-pay

## 2020-12-18 ENCOUNTER — Encounter: Payer: Self-pay | Admitting: Internal Medicine

## 2020-12-18 DIAGNOSIS — E1159 Type 2 diabetes mellitus with other circulatory complications: Secondary | ICD-10-CM

## 2020-12-18 DIAGNOSIS — I25118 Atherosclerotic heart disease of native coronary artery with other forms of angina pectoris: Secondary | ICD-10-CM

## 2020-12-18 DIAGNOSIS — K219 Gastro-esophageal reflux disease without esophagitis: Secondary | ICD-10-CM

## 2020-12-18 DIAGNOSIS — R197 Diarrhea, unspecified: Secondary | ICD-10-CM

## 2020-12-18 DIAGNOSIS — E7800 Pure hypercholesterolemia, unspecified: Secondary | ICD-10-CM | POA: Diagnosis not present

## 2020-12-18 DIAGNOSIS — R634 Abnormal weight loss: Secondary | ICD-10-CM | POA: Diagnosis not present

## 2020-12-18 DIAGNOSIS — G35 Multiple sclerosis: Secondary | ICD-10-CM

## 2020-12-18 DIAGNOSIS — I10 Essential (primary) hypertension: Secondary | ICD-10-CM

## 2020-12-18 DIAGNOSIS — R195 Other fecal abnormalities: Secondary | ICD-10-CM | POA: Diagnosis not present

## 2020-12-18 DIAGNOSIS — I6523 Occlusion and stenosis of bilateral carotid arteries: Secondary | ICD-10-CM | POA: Diagnosis not present

## 2020-12-18 DIAGNOSIS — E038 Other specified hypothyroidism: Secondary | ICD-10-CM

## 2020-12-18 DIAGNOSIS — Z720 Tobacco use: Secondary | ICD-10-CM

## 2020-12-18 DIAGNOSIS — J4520 Mild intermittent asthma, uncomplicated: Secondary | ICD-10-CM | POA: Diagnosis not present

## 2020-12-18 DIAGNOSIS — K50918 Crohn's disease, unspecified, with other complication: Secondary | ICD-10-CM

## 2020-12-18 DIAGNOSIS — D509 Iron deficiency anemia, unspecified: Secondary | ICD-10-CM

## 2020-12-18 DIAGNOSIS — R1084 Generalized abdominal pain: Secondary | ICD-10-CM | POA: Diagnosis not present

## 2020-12-18 DIAGNOSIS — E1165 Type 2 diabetes mellitus with hyperglycemia: Secondary | ICD-10-CM

## 2020-12-18 NOTE — Progress Notes (Signed)
Patient ID: Haley Santos, female   DOB: [birth year], 75 y.o.   MRN: [id number]   Subjective:    Patient ID: Haley Santos, female    DOB: [birth year], 75 y.o.   MRN: [id number]  HPI This visit occurred during the SARS-CoV-2 public health emergency.  Safety protocols were in place, including screening questions prior to the visit, additional usage of staff PPE, and extensive cleaning of exam room while observing appropriate contact time as indicated for disinfecting solutions.   Patient here for a scheduled follow up.  Was having GI issues - vomiting, diarrhea and abdominal pain.  She was diagnosed with Crohn's approximately 50 years ago. Has been on no therapy.  From a GI standpoint she has been doing relatively well until recently.  She started having vomiting, diarrhea and decreased appetite.  Has lost weight.  Back in March she was 196 pounds.  Recently saw Dr. Marius Ditch.  She ordered labs and stool studies.  She has not yet followed through with getting these test.  We discussed this today.  She had recommended an upper endoscopy and a colonoscopy.  Ms. Friedt is hesitant to proceed with these procedures.  She is agreeable to have the labs and stool studies.  Dr. Marius Ditch also raised the concern about possible pancreatic insufficiency.  She was given samples of pancreatic enzymes.  She has not started taking these.  Her symptoms have improved.  She is eating better.  Appetite is better.  No nausea or vomiting currently.  No abdominal pain currently.  Stool is formed now.  No diarrhea.  She has started back driving.  She played piano at her church this weekend.  She is having problems with her teeth.  This makes it harder to eat certain foods.  No chest pain reported.  Breathing stable.  Increased stress related to above.  She is doing better with this now.  She did have a recent abdominal ultrasound in May to evaluate abnormal liver function test.  This revealed changes that appear to be consistent with fatty  liver.  Past Medical History:  Diagnosis Date   Asthma with bronchitis    Breast cancer ([place]) 2014   CAD (coronary artery disease)    a. 08/2014: inf STEMI s/p DES to RCA.    COPD (chronic obstructive pulmonary disease) (HCC)    Crohn's disease (HCC)    Dyspnea    DOE   GERD (gastroesophageal reflux disease)    Glaucoma    Glucose intolerance (impaired glucose tolerance)    a. HgA1c 6.2 during 08/2014 admission    Hypercholesterolemia    Hypertension    Hyperthyroidism    a. s/p ablation maintained on synthroid   Hypothyroidism    Multiple sclerosis ([place])    Myocardial infarction ([place])    2016   Neuromuscular disorder ([place])    MS   Tobacco abuse    Past Surgical History:  Procedure Laterality Date   AUGMENTATION MAMMAPLASTY Left    BREAST ENHANCEMENT SURGERY  1981   [place]   CATARACT EXTRACTION W/PHACO Left 07/19/2017   Procedure: CATARACT EXTRACTION PHACO AND INTRAOCULAR LENS PLACEMENT ([place]);  Surgeon: Birder Robson, MD;  Location: [facility] ORS;  Service: Ophthalmology;  Laterality: Left;  [place] 00:49.0 AP% 14.5 CDE 7.10 Fluid Pack Lot # [id number]   CATARACT EXTRACTION W/PHACO Right 08/09/2017   Procedure: CATARACT EXTRACTION PHACO AND INTRAOCULAR LENS PLACEMENT (I[facility]);  Surgeon: Birder Robson, MD;  Location: [facility] ORS;  Service: Ophthalmology;  Laterality: Right;  [place]  00:42 AP% 13.8 CDE 5.88 Fluid pack lot #[id number] H   COLONOSCOPY  2010   Dr Vira Agar   CORONARY ANGIOPLASTY     STENT 2016   LEFT HEART CATHETERIZATION WITH CORONARY ANGIOGRAM N/A 09/01/2014   Procedure: LEFT HEART CATHETERIZATION WITH CORONARY ANGIOGRAM;  Surgeon: Peter M Martinique, MD;  Location: [facility] CATH LAB;  Service: Cardiovascular;  Laterality: N/A;   MASTECTOMY Right 2014   PATRICAL MASTECOMY RIGHT RAD CHEMO   PORTACATH PLACEMENT  11/07/2012   RIGHT OOPHORECTOMY  1979   TONSILLECTOMY AND ADENOIDECTOMY  1957   Family History  Problem Relation Age of Onset   Diabetes Mother    Heart disease  Mother        myocardial infarction   Glaucoma Mother    Berenice Primas' disease Mother    Heart attack Mother 15   Lung disease Father    Heart disease Other        half sister   Peripheral vascular disease Other        half sister   Breast cancer Neg Hx    Social History   Socioeconomic History   Marital status: Divorced    Spouse name: Not on file   Number of children: 1   Years of education: Not on file   Highest education level: Not on file  Occupational History   Not on file  Tobacco Use   Smoking status: Every Day    Packs/day: 1.00    Years: 40.00    Pack years: 40.00    Types: Cigarettes    Last attempt to quit: 07/01/2015    Years since quitting: 5.4   Smokeless tobacco: Never  Vaping Use   Vaping Use: Never used  Substance and Sexual Activity   Alcohol use: Yes    Alcohol/week: 0.0 standard drinks    Comment: RARE   Drug use: No   Sexual activity: Not on file  Other Topics Concern   Not on file  Social History Narrative   Not on file   Social Determinants of Health   Financial Resource Strain: Not on file  Food Insecurity: Not on file  Transportation Needs: Not on file  Physical Activity: Not on file  Stress: Not on file  Social Connections: Not on file    Review of Systems  Constitutional:        Has lost weight.  With improvement of symptoms, weigh is stable (on recent checks).  States she feels better at this weight.  Appetite has improved.   HENT:  Negative for congestion and sinus pressure.   Respiratory:  Negative for cough, chest tightness and shortness of breath.   Cardiovascular:  Negative for chest pain, palpitations and leg swelling.  Gastrointestinal:        No nausea, vomiting or diarrhea currently.   Genitourinary:  Negative for difficulty urinating and dysuria.  Musculoskeletal:  Negative for joint swelling and myalgias.  Skin:  Negative for color change and rash.  Neurological:  Negative for dizziness, light-headedness and headaches.   Psychiatric/Behavioral:  Negative for agitation and dysphoric mood.       Objective:    Physical Exam Vitals reviewed.  Constitutional:      General: She is not in acute distress.    Appearance: Normal appearance.  HENT:     Head: Normocephalic and atraumatic.     Right Ear: External ear normal.     Left Ear: External ear normal.  Eyes:     General: No scleral icterus.  Right eye: No discharge.        Left eye: No discharge.     Conjunctiva/sclera: Conjunctivae normal.  Neck:     Thyroid: No thyromegaly.  Cardiovascular:     Rate and Rhythm: Normal rate and regular rhythm.  Pulmonary:     Effort: No respiratory distress.     Breath sounds: Normal breath sounds. No wheezing.  Abdominal:     General: Bowel sounds are normal.     Palpations: Abdomen is soft.     Tenderness: There is no abdominal tenderness.  Musculoskeletal:        General: No swelling or tenderness.     Cervical back: Neck supple. No tenderness.  Lymphadenopathy:     Cervical: No cervical adenopathy.  Skin:    Findings: No erythema or rash.  Neurological:     Mental Status: She is alert.  Psychiatric:        Mood and Affect: Mood normal.        Behavior: Behavior normal.    BP (!) 118/58   Pulse 73   Temp (!) 95.9 F (35.5 C)   Ht 5' 5.98" (1.676 m)   Wt 172 lb (78 kg)   LMP 06/15/1977   SpO2 97%   BMI 27.77 kg/m  Wt Readings from Last 3 Encounters:  12/18/20 172 lb (78 kg)  12/03/20 170 lb 4 oz (77.2 kg)  11/10/20 177 lb (80.3 kg)    Outpatient Encounter Medications as of 12/18/2020  Medication Sig   clopidogrel (PLAVIX) 75 MG tablet Take 1 tablet (75 mg total) by mouth daily.   fluticasone-salmeterol (ADVAIR) 250-50 MCG/ACT AEPB INHALE 1 DOSE BY MOUTH TWICE DAILY   latanoprost (XALATAN) 0.005 % ophthalmic solution Place 1 drop into both eyes at bedtime.    levothyroxine (EUTHYROX) 100 MCG tablet Take 1 tablet (100 mcg total) by mouth daily.   metoprolol tartrate (LOPRESSOR) 25 MG  tablet Take 1 tablet (25 mg total) by mouth daily.   VENTOLIN HFA 108 (90 Base) MCG/ACT inhaler INHALE 2 PUFFS BY MOUTH EVERY 6 HOURS AS NEEDED FOR WHEEZING FOR SHORTNESS OF BREATH   No facility-administered encounter medications on file as of 12/18/2020.     Lab Results  Component Value Date   WBC 8.2 12/18/2020   HGB 13.8 12/18/2020   HCT 41.3 12/18/2020   PLT 242 12/18/2020   GLUCOSE 93 12/18/2020   CHOL 216 (H) 10/14/2020   TRIG 223.0 (H) 10/14/2020   HDL 35.70 (L) 10/14/2020   LDLDIRECT 144.0 10/14/2020   LDLCALC 87 08/13/2020   ALT 17 12/18/2020   AST 38 12/18/2020   NA 140 12/18/2020   K 4.1 12/18/2020   CL 104 12/18/2020   CREATININE 0.67 12/18/2020   BUN 7 (L) 12/18/2020   CO2 23 12/18/2020   TSH 0.26 (L) 08/13/2020   HGBA1C 7.0 (H) 10/14/2020   MICROALBUR 2.4 (H) 08/13/2020    US Abdomen Complete  Result Date: 09/29/2020 CLINICAL DATA:  Abnormal liver function test EXAM: ABDOMEN ULTRASOUND COMPLETE COMPARISON:  None. FINDINGS: Gallbladder: No gallstones or wall thickening visualized. No sonographic Murphy sign noted by sonographer. Common bile duct: Diameter: 5 mm Liver: No focal lesion. Diffusely increased parenchymal echogenicity. Portal vein is patent on color Doppler imaging with normal direction of blood flow towards the liver. IVC: No abnormality visualized. Pancreas: Visualized portion unremarkable. Spleen: Size and appearance within normal limits. Right Kidney: Length: 11.3 cm. Echogenicity within normal limits. No mass or hydronephrosis visualized. Left Kidney: Length: 10.1  cm. Echogenicity within normal limits. No mass or hydronephrosis visualized. Abdominal aorta: No aneurysm visualized. Other findings: None. IMPRESSION: Diffuse increased echogenicity of the hepatic parenchyma is a nonspecific indicator of hepatocellular dysfunction, most commonly steatosis. Electronically Signed   By: Miachel Roux M.D.   On: 09/29/2020 07:53       Assessment & Plan:    Problem List Items Addressed This Visit     Anemia    Follow CBC.       Asthma    Breathing stable.        CAD (coronary artery disease)    Followed by Dr Rockey Situ.  Stable.  Continue plavix and metoprolol.         Carotid artery disease ([place])    Has been followed by [facility] - 40-59% bilaterally.  Last seen 10/2019. Recommended f/u in one year. Need to continue f/u.        Crohn's disease ([place])    Diagnosed in her 99s.  Recent symptoms as outlined.  Discussed the need for f/u with GI for EGD and colonoscopy.  She is hesitant to proceed.  Is agreeable to have labs and to check stool studies.  Weight has leveled off.  She is eating.  Appetite has improved.  No nausea or vomiting.  No diarrhea.  Bowels moving.  Discussed CT scan.  Wants to start with labs and hold on further testing at this time.        Diarrhea   Relevant Orders   Clostridium Difficile by PCR([facility])   GERD (gastroesophageal reflux disease)    No acid reflux reported today.  No nausea or vomiting.  Not on PPI currently.        Hypercholesterolemia    Was previously on Crestor and Zetia.  Off now.  Has lost weight.  We will plan for follow-up fast lipid profile.        Hypertension    Continue metoprolol.  Blood pressure as outlined.  No changes.  Follow pressure. Follow metabolic panel.        Hypothyroidism    On thyroid replacement.  Follow tsh.        Multiple sclerosis ([place])    Has desired no further intervention.  Follow.        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HCC)    A1c - 10/2020 - 7.0.  Has lost weight.  Has previously declined medication.  Follow met b and a1c.        Tobacco abuse    Have discussed the need to quit smoking.  Desires to continue.         Einar Pheasant, MD

## 2020-12-19 LAB — CBC
Hematocrit: 41.3 % (ref 34.0–46.6)
Hemoglobin: 13.8 g/dL (ref 11.1–15.9)
MCH: 27.4 pg (ref 26.6–33.0)
MCHC: 33.4 g/dL (ref 31.5–35.7)
MCV: 82 fL (ref 79–97)
Platelets: 242 10*3/uL (ref 150–450)
RBC: 5.04 x10E6/uL (ref 3.77–5.28)
RDW: 15.1 % (ref 11.7–15.4)
WBC: 8.2 10*3/uL (ref 3.4–10.8)

## 2020-12-19 LAB — SPECIMEN STATUS REPORT

## 2020-12-19 LAB — H. PYLORI BREATH TEST: H pylori Breath Test: NEGATIVE

## 2020-12-20 ENCOUNTER — Encounter: Payer: Self-pay | Admitting: Internal Medicine

## 2020-12-20 NOTE — Assessment & Plan Note (Addendum)
Followed by Dr Rockey Situ.  Stable.  Continue plavix and metoprolol.

## 2020-12-20 NOTE — Assessment & Plan Note (Signed)
A1c - 10/2020 - 7.0.  Has lost weight.  Has previously declined medication.  Follow met b and a1c.

## 2020-12-20 NOTE — Assessment & Plan Note (Signed)
Continue metoprolol.  Blood pressure as outlined.  No changes.  Follow pressure. Follow metabolic panel.

## 2020-12-20 NOTE — Assessment & Plan Note (Signed)
No acid reflux reported today.  No nausea or vomiting.  Not on PPI currently.

## 2020-12-20 NOTE — Assessment & Plan Note (Signed)
Has desired no further intervention.  Follow.

## 2020-12-20 NOTE — Assessment & Plan Note (Signed)
Was previously on Crestor and Zetia.  Off now.  Has lost weight.  We will plan for follow-up fast lipid profile.

## 2020-12-20 NOTE — Assessment & Plan Note (Signed)
Breathing stable.

## 2020-12-20 NOTE — Assessment & Plan Note (Signed)
Have discussed the need to quit smoking.  Desires to continue.

## 2020-12-20 NOTE — Assessment & Plan Note (Signed)
On thyroid replacement.  Follow tsh.  

## 2020-12-20 NOTE — Assessment & Plan Note (Signed)
Diagnosed in her 83s.  Recent symptoms as outlined.  Discussed the need for f/u with GI for EGD and colonoscopy.  She is hesitant to proceed.  Is agreeable to have labs and to check stool studies.  Weight has leveled off.  She is eating.  Appetite has improved.  No nausea or vomiting.  No diarrhea.  Bowels moving.  Discussed CT scan.  Wants to start with labs and hold on further testing at this time.

## 2020-12-20 NOTE — Assessment & Plan Note (Signed)
Follow CBC.

## 2020-12-20 NOTE — Assessment & Plan Note (Signed)
Has been followed by [facility] - 40-59% bilaterally.  Last seen 10/2019. Recommended f/u in one year. Need to continue f/u.

## 2020-12-21 ENCOUNTER — Telehealth: Payer: Self-pay

## 2020-12-21 ENCOUNTER — Other Ambulatory Visit: Payer: Self-pay

## 2020-12-21 ENCOUNTER — Encounter: Payer: Self-pay | Admitting: Emergency Medicine

## 2020-12-21 ENCOUNTER — Emergency Department: Payer: Medicare Other

## 2020-12-21 ENCOUNTER — Emergency Department
Admission: EM | Admit: 2020-12-21 | Discharge: 2020-12-21 | Disposition: A | Discharge status: Home / Self Care | Payer: Medicare Other | Attending: Emergency Medicine | Admitting: Emergency Medicine

## 2020-12-21 DIAGNOSIS — Z7951 Long term (current) use of inhaled steroids: Secondary | ICD-10-CM | POA: Diagnosis not present

## 2020-12-21 DIAGNOSIS — I2510 Atherosclerotic heart disease of native coronary artery without angina pectoris: Secondary | ICD-10-CM | POA: Insufficient documentation

## 2020-12-21 DIAGNOSIS — I10 Essential (primary) hypertension: Secondary | ICD-10-CM | POA: Diagnosis not present

## 2020-12-21 DIAGNOSIS — R52 Pain, unspecified: Secondary | ICD-10-CM | POA: Diagnosis not present

## 2020-12-21 DIAGNOSIS — R609 Edema, unspecified: Secondary | ICD-10-CM | POA: Diagnosis not present

## 2020-12-21 DIAGNOSIS — R1084 Generalized abdominal pain: Secondary | ICD-10-CM | POA: Diagnosis not present

## 2020-12-21 DIAGNOSIS — E119 Type 2 diabetes mellitus without complications: Secondary | ICD-10-CM | POA: Diagnosis not present

## 2020-12-21 DIAGNOSIS — E039 Hypothyroidism, unspecified: Secondary | ICD-10-CM | POA: Diagnosis not present

## 2020-12-21 DIAGNOSIS — K50918 Crohn's disease, unspecified, with other complication: Secondary | ICD-10-CM | POA: Diagnosis not present

## 2020-12-21 DIAGNOSIS — F17210 Nicotine dependence, cigarettes, uncomplicated: Secondary | ICD-10-CM | POA: Diagnosis not present

## 2020-12-21 DIAGNOSIS — Z7902 Long term (current) use of antithrombotics/antiplatelets: Secondary | ICD-10-CM | POA: Diagnosis not present

## 2020-12-21 DIAGNOSIS — Z79899 Other long term (current) drug therapy: Secondary | ICD-10-CM | POA: Insufficient documentation

## 2020-12-21 DIAGNOSIS — J449 Chronic obstructive pulmonary disease, unspecified: Secondary | ICD-10-CM | POA: Insufficient documentation

## 2020-12-21 DIAGNOSIS — M79604 Pain in right leg: Secondary | ICD-10-CM | POA: Diagnosis not present

## 2020-12-21 DIAGNOSIS — R600 Localized edema: Secondary | ICD-10-CM | POA: Diagnosis not present

## 2020-12-21 DIAGNOSIS — R195 Other fecal abnormalities: Secondary | ICD-10-CM | POA: Diagnosis not present

## 2020-12-21 DIAGNOSIS — M7989 Other specified soft tissue disorders: Secondary | ICD-10-CM | POA: Diagnosis present

## 2020-12-21 DIAGNOSIS — Z853 Personal history of malignant neoplasm of breast: Secondary | ICD-10-CM | POA: Diagnosis not present

## 2020-12-21 DIAGNOSIS — R531 Weakness: Secondary | ICD-10-CM | POA: Diagnosis not present

## 2020-12-21 DIAGNOSIS — M791 Myalgia, unspecified site: Secondary | ICD-10-CM | POA: Insufficient documentation

## 2020-12-21 DIAGNOSIS — R634 Abnormal weight loss: Secondary | ICD-10-CM | POA: Diagnosis not present

## 2020-12-21 DIAGNOSIS — M79605 Pain in left leg: Secondary | ICD-10-CM | POA: Diagnosis not present

## 2020-12-21 DIAGNOSIS — W19XXXA Unspecified fall, initial encounter: Secondary | ICD-10-CM | POA: Diagnosis not present

## 2020-12-21 DIAGNOSIS — R197 Diarrhea, unspecified: Secondary | ICD-10-CM | POA: Diagnosis not present

## 2020-12-21 DIAGNOSIS — J45909 Unspecified asthma, uncomplicated: Secondary | ICD-10-CM | POA: Diagnosis not present

## 2020-12-21 LAB — CBC WITH DIFFERENTIAL/PLATELET
Abs Immature Granulocytes: 0.08 10*3/uL — ABNORMAL HIGH (ref 0.00–0.07)
Basophils Absolute: 0 10*3/uL (ref 0.0–0.1)
Basophils Relative: 0 %
Eosinophils Absolute: 0 10*3/uL (ref 0.0–0.5)
Eosinophils Relative: 0 %
HCT: 42 % (ref 36.0–46.0)
Hemoglobin: 14.2 g/dL (ref 12.0–15.0)
Immature Granulocytes: 1 %
Lymphocytes Relative: 11 %
Lymphs Abs: 1.1 10*3/uL (ref 0.7–4.0)
MCH: 28.6 pg (ref 26.0–34.0)
MCHC: 33.8 g/dL (ref 30.0–36.0)
MCV: 84.5 fL (ref 80.0–100.0)
Monocytes Absolute: 0.6 10*3/uL (ref 0.1–1.0)
Monocytes Relative: 6 %
Neutro Abs: 8.1 10*3/uL — ABNORMAL HIGH (ref 1.7–7.7)
Neutrophils Relative %: 82 %
Platelets: 244 10*3/uL (ref 150–400)
RBC: 4.97 MIL/uL (ref 3.87–5.11)
RDW: 15.9 % — ABNORMAL HIGH (ref 11.5–15.5)
WBC: 10 10*3/uL (ref 4.0–10.5)
nRBC: 0 % (ref 0.0–0.2)

## 2020-12-21 LAB — MAGNESIUM: Magnesium: 1.9 mg/dL (ref 1.7–2.4)

## 2020-12-21 LAB — BASIC METABOLIC PANEL WITH GFR
Anion gap: 10 (ref 5–15)
BUN: 9 mg/dL (ref 8–23)
CO2: 25 mmol/L (ref 22–32)
Calcium: 9.3 mg/dL (ref 8.9–10.3)
Chloride: 98 mmol/L (ref 98–111)
Creatinine, Ser: 0.69 mg/dL (ref 0.44–1.00)
GFR, Estimated: >60 mL/min
Glucose, Bld: 128 mg/dL — ABNORMAL HIGH (ref 70–99)
Potassium: 4.2 mmol/L (ref 3.5–5.1)
Sodium: 133 mmol/L — ABNORMAL LOW (ref 135–145)

## 2020-12-21 LAB — CK: Total CK: 46 U/L (ref 38–234)

## 2020-12-21 MED ORDER — LIDOCAINE 4 % EX PTCH: 1.0000 | MEDICATED_PATCH | Freq: Two times a day (BID) | CUTANEOUS | 0 refills | Status: DC

## 2020-12-21 MED ORDER — IBUPROFEN 200 MG PO TABS: 400.0000 mg | ORAL_TABLET | Freq: Four times a day (QID) | ORAL | 0 refills | Status: AC | PRN

## 2020-12-21 MED ORDER — IBUPROFEN 400 MG PO TABS
400.0000 mg | ORAL_TABLET | Freq: Once | ORAL | Status: AC
  Administered 2020-12-21: 400 mg via ORAL
  Filled 2020-12-21: qty 1

## 2020-12-21 NOTE — ED Notes (Signed)
FIRST NURSE: Pt to ER via EMS from home with c/o bilateral leg pain that started Thursday.  Pt also c/o swelling to R leg.  VSS.  Pt has not taken AM meds

## 2020-12-21 NOTE — Telephone Encounter (Signed)
Reviewed chart.  She is currently in the ER with complaints of leg pain.  Labs pending.  W/up in progress.

## 2020-12-21 NOTE — ED Provider Notes (Signed)
Was signed out to me at 3 PM pending a Doppler ultrasound of bilateral lower extremities and labs.  75 year old female presents with atraumatic right leg pain as well as left leg swelling.  Her ultrasounds are negative.  CK normal.  On my evaluation patient still having pain primarily of the right knee and right calf.  My exam she has good pulses normal strength compartments are soft and there is no obvious swelling or deformity.  Unclear what the etiology of her pain is.  We discussed NSAIDs, lidocaine patch and close follow-up with her primary care provider.   Rada Hay, MD 12/21/20 1710

## 2020-12-21 NOTE — ED Triage Notes (Signed)
Pt reports that her left leg is swollen, and now her right leg is starting to hurt her. She is that her legs are becoming weaker. She reports that her legs have become so weak that she had to urinate in a bucket because of the pain and weakness of her legs. No injury that she is aware of. Left leg is swollen, the right is not swollen.

## 2020-12-21 NOTE — Telephone Encounter (Signed)
FYI- Pt cousin came in today to drop off lab samples. She stated that Rudene is currently feeling extremely ill and is admitted in the local hospital. Pt is being seen at [facility].

## 2020-12-21 NOTE — ED Notes (Signed)
Pt presents to the Ed with complaints of left lower leg swelling around ankle area. Pt reports pain upon palpation, and movement. Pt first noticed swelling 2 days ago with progressive pain. CMS in tact, with slight redness in posterior left ankle.

## 2020-12-21 NOTE — ED Provider Notes (Signed)
[facility] Emergency Department Provider Note  ____________________________________________   Event Date/Time   First MD Initiated Contact with Patient 12/21/20 1414     (approximate)  I have reviewed the triage vital signs and the nursing notes.   HISTORY  Chief Complaint Leg Swelling   HPI Haley Santos is a 75 y.o. female with a past medical history of asthma, CAD, COPD, GERD, HTN, HDL, MS and Crohn's disease who presents for assessment of approximately 4 days of some swelling in the left leg and some soreness in the right leg behind the right knee and posterior right calf.  She denies any injuries or falls.  No breath sounds episodes.  No clear alleviating aggravating factors.  She denies any new headache, earache, sore throat, chest pain, cough, shortness of breath abdominal pain, fevers, back pain, urinary symptoms, rash or other clear associated sick symptoms.  She has not taken any analgesia and declines any on initial interview.  No other acute concerns at this time.          Past Medical History:  Diagnosis Date   Asthma with bronchitis    Breast cancer ([place]) 2014   CAD (coronary artery disease)    a. 08/2014: inf STEMI s/p DES to RCA.    COPD (chronic obstructive pulmonary disease) (HCC)    Crohn's disease ([place])    Dyspnea    DOE   GERD (gastroesophageal reflux disease)    Glaucoma    Glucose intolerance (impaired glucose tolerance)    a. HgA1c 6.2 during 08/2014 admission    Hypercholesterolemia    Hypertension    Hyperthyroidism    a. 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[place]) 09/15/2015   Sinusitis, acute frontal 06/30/2015   Left arm pain 05/08/2015   Pain of left calf 05/08/2015   Diabetes mellitus ([place]) 05/08/2015   Carotid artery disease ([place]) 09/19/2014   CAD (coronary artery disease) 09/19/2014   GERD (gastroesophageal reflux disease)    Crohn's disease (HCC)    ST elevation myocardial infarction (STEMI) of inferior wall ([place]) 09/02/2014   Tobacco abuse 09/02/2014   Carotid bruit 03/31/2014   Joint pain 10/06/2013   Sinus tachycardia 12/21/2012   Breast nodule 09/20/2012   Hypertension 06/16/2012   Hypercholesterolemia 06/16/2012   Asthma 06/16/2012   Multiple sclerosis ([place]) 06/16/2012   Psoriasis 06/16/2012   Hypothyroidism 06/16/2012   Glaucoma 06/16/2012    Past Surgical History:  Procedure Laterality Date   AUGMENTATION MAMMAPLASTY Left    BREAST ENHANCEMENT SURGERY  1981   [facility]   CATARACT EXTRACTION W/PHACO Left 07/19/2017   Procedure: CATARACT EXTRACTION PHACO AND INTRAOCULAR LENS PLACEMENT ([place]);  Surgeon: Birder Robson, MD;  Location: [facility] ORS;  Service: Ophthalmology;  Laterality: Left;  [place] 00:49.0 AP% 14.5 CDE 7.10 Fluid Pack Lot # [id number]   CATARACT EXTRACTION W/PHACO Right 08/09/2017   Procedure: CATARACT EXTRACTION PHACO AND INTRAOCULAR LENS PLACEMENT ([facility]);  Surgeon: Birder Robson, MD;  Location: [facility] ORS;  Service: Ophthalmology;  Laterality: Right;  [place] 00:42 AP% 13.8 CDE 5.88 Fluid pack lot #[id number]   COLONOSCOPY  2010   Dr Vira Agar   CORONARY ANGIOPLASTY     STENT 2016   LEFT HEART CATHETERIZATION WITH CORONARY ANGIOGRAM N/A 09/01/2014   Procedure: LEFT HEART CATHETERIZATION WITH CORONARY ANGIOGRAM;  Surgeon: Peter M Martinique, MD;  Location: [facility] CATH LAB;  Service: Cardiovascular;  Laterality: N/A;   MASTECTOMY Right 2014   PATRICAL MASTECOMY RIGHT RAD CHEMO   PORTACATH PLACEMENT  11/07/2012    RIGHT OOPHORECTOMY  1979   TONSILLECTOMY AND ADENOIDECTOMY  1957    Prior to Admission medications   Medication Sig Start Date End Date Taking? Authorizing Provider  clopidogrel (PLAVIX) 75 MG tablet Take 1 tablet (75 mg total) by mouth daily. 08/11/20   Minna Merritts, MD  fluticasone-salmeterol (ADVAIR) 250-50 MCG/ACT AEPB INHALE 1 DOSE BY MOUTH TWICE DAILY 10/01/20   Einar Pheasant, MD  latanoprost (XALATAN) 0.005 % ophthalmic solution Place 1 drop into both eyes at bedtime.  05/29/14   [provider]  levothyroxine (EUTHYROX) 100 MCG tablet Take 1 tablet (100 mcg total) by mouth daily. 10/01/20   Einar Pheasant, MD  metoprolol tartrate (LOPRESSOR) 25 MG tablet Take 1 tablet (25 mg total) by mouth daily. 08/11/20   Minna Merritts, MD  VENTOLIN HFA 108 (90 Base) MCG/ACT inhaler INHALE 2 PUFFS BY MOUTH EVERY 6 HOURS AS NEEDED FOR WHEEZING FOR SHORTNESS OF BREATH 10/01/20   Einar Pheasant, MD    Allergies Statins, Augmentin [amoxicillin-pot clavulanate], Clindamycin/lincomycin, Codeine, Shellfish allergy, and Valium [diazepam]  Family History  Problem Relation Age of Onset   Diabetes Mother    Heart disease Mother        myocardial infarction   Glaucoma Mother    Berenice Primas' disease Mother    Heart attack Mother 46   Lung disease Father    Heart disease Other        half sister   Peripheral vascular disease Other        half sister   Breast cancer Neg Hx     Social History Social History   Tobacco Use   Smoking status: Every Day    Packs/day: 1.00    Years: 40.00    Pack years: 40.00    Types: Cigarettes    Last attempt to quit: 07/01/2015    Years since quitting: 5.4   Smokeless tobacco: Never  Vaping Use   Vaping Use: Never used  Substance Use Topics   Alcohol use: Yes    Alcohol/week: 0.0 standard drinks    Comment: RARE   Drug use: No    Review of Systems  Review of Systems  Constitutional:  Negative for chills and fever.  HENT:  Negative for sore  throat.   Eyes:  Negative for pain.  Respiratory:  Negative for cough and stridor.   Cardiovascular:  Negative for chest pain.  Gastrointestinal:  Negative for vomiting.  Genitourinary:  Negative for dysuria.  Musculoskeletal:  Positive for myalgias (R leg behind R leg and posterior R calf).  Skin:  Negative for rash.  Neurological:  Negative for seizures, loss of consciousness and headaches.  Psychiatric/Behavioral:  Negative for suicidal ideas.   All other systems reviewed and are negative.    ____________________________________________   PHYSICAL EXAM:  VITAL SIGNS: ED Triage Vitals  Enc Vitals Group     BP 12/21/20 1258 137/69     Pulse Rate 12/21/20 1258 (!) 102  Resp 12/21/20 1258 16     Temp 12/21/20 1258 98 F (36.7 C)     Temp Source 12/21/20 1258 Oral     SpO2 12/21/20 1258 97 %     Weight 12/21/20 1314 172 lb (78 kg)     Height 12/21/20 1314 5' 6"  (1.676 m)     Head Circumference --      Peak Flow --      Pain Score 12/21/20 1314 9     Pain Loc --      Pain Edu? --      Excl. in [place]? --    Vitals:   12/21/20 1258 12/21/20 1430  BP: 137/69 119/65  Pulse: (!) 102 95  Resp: 16 (!) 27  Temp: 98 F (36.7 C)   SpO2: 97% 100%   Physical Exam Vitals and nursing note reviewed.  Constitutional:      General: She is not in acute distress.    Appearance: She is well-developed.  HENT:     Head: Normocephalic and atraumatic.     Right Ear: External ear normal.     Left Ear: External ear normal.     Nose: Nose normal.     Mouth/Throat:     Mouth: Mucous membranes are moist.  Eyes:     Conjunctiva/sclera: Conjunctivae normal.  Cardiovascular:     Rate and Rhythm: Normal rate and regular rhythm.     Heart sounds: No murmur heard. Pulmonary:     Effort: Pulmonary effort is normal. No respiratory distress.     Breath sounds: Normal breath sounds.  Abdominal:     Palpations: Abdomen is soft.     Tenderness: There is no abdominal tenderness.   Musculoskeletal:     Cervical back: Neck supple.     Left lower leg: Edema present.  Skin:    General: Skin is warm and dry.     Capillary Refill: Capillary refill takes less than 2 seconds.  Neurological:     Mental Status: She is alert and oriented to person, place, and time.  Psychiatric:        Mood and Affect: Mood normal.    Sensation is intact to light touch throughout both lower extremities.  2+ DP pulses.  There are some subtle edema in the left leg without significant edema in the right leg.  No significant overlying skin changes over the dorsum of the feet, ankles, calves, knee or otherwise.  No large joint effusions or point tenderness or palpable induration or cords.  Patient is able to range her joints without difficulty. ____________________________________________   LABS (all labs ordered are listed, but only abnormal results are displayed)  Labs Reviewed  CBC WITH DIFFERENTIAL/PLATELET - Abnormal; Notable for the following components:      Result Value   RDW 15.9 (*)    Neutro Abs 8.1 (*)    Abs Immature Granulocytes 0.08 (*)    All other components within normal limits  BASIC METABOLIC PANEL - Abnormal; Notable for the following components:   Sodium 133 (*)    Glucose, Bld 128 (*)    All other components within normal limits  CK  MAGNESIUM  URINALYSIS, COMPLETE (UACMP) WITH MICROSCOPIC   ____________________________________________  EKG  ____________________________________________  RADIOLOGY  ED MD interpretation:   Official radiology report(s): No results found.  ____________________________________________   PROCEDURES  Procedure(s) performed (including Critical Care):  Procedures   ____________________________________________   INITIAL IMPRESSION / ASSESSMENT AND PLAN / ED COURSE  Presents for 4 days of swelling in her left leg fairly symmetric throughout as well some soreness in her right leg behind the calf and right knee.   No associated trauma.  On arrival she is afebrile hemodynamically stable.  No history or exam features to suggest precipitating trauma.  Although somewhat the patient has swelling in the left and some discomfort on the right but no swelling on the right and no significant discomfort in the left initial differential includes DVT, muscle spasms, myositis, Baker's cyst possible neuropathy.  No convincing evidence of a septic joint, cellulitis and compartments are soft throughout and not suggestive of compartment syndrome.  BMP shows no significant electrode or metabolic derangements.  CBC is unremarkable.  CK is not consistent with myositis and magnesium is WNL.  We will plan to obtain lower extremity ultrasound without DVT.  Care patient signed over to oncoming provider at approximately 1500.  Plan is to follow-up ultrasound and reassess and likely discharge with outpatient follow-up if ultrasound is negative.    ____________________________________________   FINAL CLINICAL IMPRESSION(S) / ED DIAGNOSES  Final diagnoses:  Leg edema  Myalgia    Medications - No data to display   ED Discharge Orders     None        Note:  This document was prepared using Dragon voice recognition software and may include unintentional dictation errors.    Lucrezia Starch, MD 12/21/20 [phone]

## 2020-12-21 NOTE — Addendum Note (Signed)
Addended by: Leeanne Rio on: 12/21/2020 12:00 PM   Modules accepted: Orders

## 2020-12-22 ENCOUNTER — Telehealth: Payer: Self-pay

## 2020-12-22 DIAGNOSIS — E559 Vitamin D deficiency, unspecified: Secondary | ICD-10-CM

## 2020-12-22 DIAGNOSIS — K50918 Crohn's disease, unspecified, with other complication: Secondary | ICD-10-CM

## 2020-12-22 LAB — B12 AND FOLATE PANEL
Folate: 4.8 ng/mL (ref >3.0)
Vitamin B-12: 378 pg/mL (ref 232–1245)

## 2020-12-22 LAB — C-REACTIVE PROTEIN: CRP: 11 mg/L — ABNORMAL HIGH (ref 0–10)

## 2020-12-22 LAB — COMPREHENSIVE METABOLIC PANEL
ALT: 17 IU/L (ref 0–32)
AST: 38 IU/L (ref 0–40)
Albumin/Globulin Ratio: 1.2 (ref 1.2–2.2)
Albumin: 3.7 g/dL (ref 3.7–4.7)
Alkaline Phosphatase: 141 IU/L — ABNORMAL HIGH (ref 44–121)
BUN/Creatinine Ratio: 10 — ABNORMAL LOW (ref 12–28)
BUN: 7 mg/dL — ABNORMAL LOW (ref 8–27)
Bilirubin Total: 0.5 mg/dL (ref 0.0–1.2)
CO2: 23 mmol/L (ref 20–29)
Calcium: 9.3 mg/dL (ref 8.7–10.3)
Chloride: 104 mmol/L (ref 96–106)
Creatinine, Ser: 0.67 mg/dL (ref 0.57–1.00)
Globulin, Total: 3.1 g/dL (ref 1.5–4.5)
Glucose: 93 mg/dL (ref 65–99)
Potassium: 4.1 mmol/L (ref 3.5–5.2)
Sodium: 140 mmol/L (ref 134–144)
Total Protein: 6.8 g/dL (ref 6.0–8.5)
eGFR: 92 mL/min/{1.73_m2} (ref >59)

## 2020-12-22 LAB — IRON,TIBC AND FERRITIN PANEL
Ferritin: 68 ng/mL (ref 15–150)
Iron Saturation: 31 % (ref 15–55)
Iron: 83 ug/dL (ref 27–139)
Total Iron Binding Capacity: 270 ug/dL (ref 250–450)
UIBC: 187 ug/dL (ref 118–369)

## 2020-12-22 LAB — CELIAC DISEASE PANEL
Endomysial IgA: NEGATIVE
IgA/Immunoglobulin A, Serum: 272 mg/dL (ref 64–422)
Transglutaminase IgA: <2 U/mL (ref 0–3)

## 2020-12-22 NOTE — Telephone Encounter (Signed)
-----   Message from [name], MD sent at 12/22/2020 11:08 AM EDT ----- Haley Santos  Please inform patient that her blood tests reveal that she has mild inflammation. Also, recommend to check vitamin D levels. I'm still waiting on her stool studies. She was in the ER yesterday for non GI issues  RV

## 2020-12-22 NOTE — Telephone Encounter (Signed)
Please advised what diagnosis you want to use for the vitamin D because patient has medicare so we will need a vitamin d deficiency or another medicare diagnosis. Patient states she will get vitamin d done and she return the stool studies to Her PCP yesterday

## 2020-12-23 LAB — CALPROTECTIN, FECAL: Calprotectin, Fecal: 72 ug/g (ref 0–120)

## 2020-12-23 LAB — GI PROFILE, STOOL, PCR

## 2020-12-23 LAB — SPECIMEN STATUS REPORT

## 2020-12-23 LAB — CLOSTRIDIUM DIFFICILE BY PCR: Toxigenic C. Difficile by PCR: NEGATIVE

## 2020-12-28 ENCOUNTER — Telehealth: Payer: Self-pay

## 2020-12-28 NOTE — Telephone Encounter (Signed)
-----   Message from [name], MD sent at 12/27/2020  9:47 AM EDT ----- Stool studies are also negative. I recommend colonoscopy, if patient is agreeable  RV

## 2020-12-28 NOTE — Telephone Encounter (Signed)
Called and left a message for call back  

## 2020-12-29 NOTE — Telephone Encounter (Signed)
Called and left a message for call back. Sent mychart to message

## 2020-12-29 NOTE — Telephone Encounter (Signed)
Patient has not log in to mychart since 11/03/15. Mailed patient a letter\.

## 2021-01-07 ENCOUNTER — Telehealth: Payer: Self-pay

## 2021-01-07 LAB — SPECIMEN STATUS REPORT

## 2021-01-07 LAB — PANCREATIC ELASTASE, FECAL: Pancreatic Elastase, Fecal: 83 ug Elast./g — ABNORMAL LOW (ref >200)

## 2021-01-07 NOTE — Telephone Encounter (Signed)
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  We already gave her pancreatic enzymes samples during last visit.  Please check with her if she has tried and if they are working.  Please send in prescription for Creon or Zenpep 2 capsules with each meal and 1 with snack based on her insurance  I will see her as scheduled for follow-up  Rohini Vanga

## 2021-01-07 NOTE — Telephone Encounter (Signed)
Called and left a message for call back  

## 2021-01-11 ENCOUNTER — Other Ambulatory Visit: Payer: Self-pay | Admitting: Internal Medicine

## 2021-01-11 ENCOUNTER — Other Ambulatory Visit: Payer: Self-pay

## 2021-01-11 DIAGNOSIS — E559 Vitamin D deficiency, unspecified: Secondary | ICD-10-CM

## 2021-01-11 DIAGNOSIS — K50918 Crohn's disease, unspecified, with other complication: Secondary | ICD-10-CM

## 2021-01-11 NOTE — Telephone Encounter (Signed)
Patient states that she has not tried the samples because she was not taking a medication for a guess diagnosis. She states now she will try the samples and let us know how they work and she will let us know if we can call her in a prescription. She states to cancel her appointment for 02/04/2021 and she will call back to reschedule. Cancel the appointment

## 2021-01-11 NOTE — Telephone Encounter (Signed)
Called and left a message for call back  

## 2021-01-19 ENCOUNTER — Ambulatory Visit: Payer: Medicare Other | Admitting: Gastroenterology

## 2021-02-04 ENCOUNTER — Ambulatory Visit: Payer: Medicare Other | Admitting: Gastroenterology

## 2021-03-08 ENCOUNTER — Ambulatory Visit (INDEPENDENT_AMBULATORY_CARE_PROVIDER_SITE_OTHER): Payer: Medicare Other | Admitting: Internal Medicine

## 2021-03-08 ENCOUNTER — Other Ambulatory Visit: Payer: Self-pay

## 2021-03-08 ENCOUNTER — Ambulatory Visit: Payer: Medicare Other | Admitting: Internal Medicine

## 2021-03-08 DIAGNOSIS — E7800 Pure hypercholesterolemia, unspecified: Secondary | ICD-10-CM

## 2021-03-08 DIAGNOSIS — E1165 Type 2 diabetes mellitus with hyperglycemia: Secondary | ICD-10-CM | POA: Diagnosis not present

## 2021-03-08 DIAGNOSIS — E038 Other specified hypothyroidism: Secondary | ICD-10-CM | POA: Diagnosis not present

## 2021-03-08 DIAGNOSIS — I10 Essential (primary) hypertension: Secondary | ICD-10-CM

## 2021-03-08 DIAGNOSIS — I6523 Occlusion and stenosis of bilateral carotid arteries: Secondary | ICD-10-CM | POA: Diagnosis not present

## 2021-03-08 DIAGNOSIS — Z0000 Encounter for general adult medical examination without abnormal findings: Secondary | ICD-10-CM

## 2021-03-08 DIAGNOSIS — J4520 Mild intermittent asthma, uncomplicated: Secondary | ICD-10-CM

## 2021-03-08 DIAGNOSIS — Z720 Tobacco use: Secondary | ICD-10-CM

## 2021-03-08 DIAGNOSIS — G35 Multiple sclerosis: Secondary | ICD-10-CM | POA: Diagnosis not present

## 2021-03-08 DIAGNOSIS — D509 Iron deficiency anemia, unspecified: Secondary | ICD-10-CM

## 2021-03-08 DIAGNOSIS — I25118 Atherosclerotic heart disease of native coronary artery with other forms of angina pectoris: Secondary | ICD-10-CM

## 2021-03-08 DIAGNOSIS — K8689 Other specified diseases of pancreas: Secondary | ICD-10-CM

## 2021-03-08 DIAGNOSIS — K50918 Crohn's disease, unspecified, with other complication: Secondary | ICD-10-CM

## 2021-03-08 MED ORDER — FLUTICASONE-SALMETEROL 250-50 MCG/ACT IN AEPB: INHALATION_SPRAY | RESPIRATORY_TRACT | 0 refills | Status: DC

## 2021-03-08 MED ORDER — VENTOLIN HFA 108 (90 BASE) MCG/ACT IN AERS: INHALATION_SPRAY | RESPIRATORY_TRACT | 0 refills | Status: DC

## 2021-03-08 NOTE — Progress Notes (Signed)
Patient ID: [name], female   DOB: 05/28/1945, 75 y.o.   MRN: [id number]   Subjective:    Patient ID: [name], female    DOB: 01/14/1946, 75 y.o.   MRN: [id number]  This visit occurred during the SARS-CoV-2 public health emergency.  Safety protocols were in place, including screening questions prior to the visit, additional usage of staff PPE, and extensive cleaning of exam room while observing appropriate contact time as indicated for disinfecting solutions.   Patient here for her physical exam.    Chief Complaint  Patient presents with   Annual Exam   .   HPI Saw GI regarding f/u Crohn's.  Previous decreased appetite, diarrhea, etc.  S/p colonoscopy and EGD.  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  Was given pancreatic enzymes.  She has not taken.  Discussed the recommendation to take.  She declines.  States she is feeling better.  Getting out more.  Eating better.  Appetite better.  No nausea or vomiting.  No increased abdominal pain.     Past Medical History:  Diagnosis Date   Asthma with bronchitis    Breast cancer ([place]) 2014   CAD (coronary artery disease)    a. 08/2014: inf STEMI s/p DES to RCA.    COPD (chronic obstructive pulmonary disease) (HCC)    Crohn's disease (HCC)    Dyspnea    DOE   GERD (gastroesophageal reflux disease)    Glaucoma    Glucose intolerance (impaired glucose tolerance)    a. HgA1c 6.2 during 08/2014 admission    Hypercholesterolemia    Hypertension    Hyperthyroidism    a. s/p ablation maintained on synthroid   Hypothyroidism    Multiple sclerosis ([place])    Myocardial infarction ([place])    2016   Neuromuscular disorder ([place])    MS   Tobacco abuse    Past Surgical History:  Procedure Laterality Date   AUGMENTATION MAMMAPLASTY Left    BREAST ENHANCEMENT SURGERY  1981   [place]   CATARACT EXTRACTION W/PHACO Left 07/19/2017   Procedure: CATARACT EXTRACTION PHACO AND INTRAOCULAR LENS PLACEMENT ([place]);   Surgeon: Birder Robson, MD;  Location: [facility] ORS;  Service: Ophthalmology;  Laterality: Left;  [place] 00:49.0 AP% 14.5 CDE 7.10 Fluid Pack Lot # [id number]   CATARACT EXTRACTION W/PHACO Right 08/09/2017   Procedure: CATARACT EXTRACTION PHACO AND INTRAOCULAR LENS PLACEMENT ([facility]);  Surgeon: Birder Robson, MD;  Location: [facility] ORS;  Service: Ophthalmology;  Laterality: Right;  [place] 00:42 AP% 13.8 CDE 5.88 Fluid pack lot #[id number]   COLONOSCOPY  2010   Dr Vira Agar   CORONARY ANGIOPLASTY     STENT 2016   LEFT HEART CATHETERIZATION WITH CORONARY ANGIOGRAM N/A 09/01/2014   Procedure: LEFT HEART CATHETERIZATION WITH CORONARY ANGIOGRAM;  Surgeon: Peter M Martinique, MD;  Location: [facility] CATH LAB;  Service: Cardiovascular;  Laterality: N/A;   MASTECTOMY Right 2014   PATRICAL MASTECOMY RIGHT RAD CHEMO   PORTACATH PLACEMENT  11/07/2012   RIGHT OOPHORECTOMY  1979   TONSILLECTOMY AND ADENOIDECTOMY  1957   Family History  Problem Relation Age of Onset   Diabetes Mother    Heart disease Mother        myocardial infarction   Glaucoma Mother    Berenice Primas' disease Mother    Heart attack Mother 1   Lung disease Father    Heart disease Other        half sister   Peripheral vascular disease Other  half sister   Breast cancer Neg Hx    Social History   Socioeconomic History   Marital status: Divorced    Spouse name: Not on file   Number of children: 1   Years of education: Not on file   Highest education level: Not on file  Occupational History   Not on file  Tobacco Use   Smoking status: Every Day    Packs/day: 1.00    Years: 40.00    Pack years: 40.00    Types: Cigarettes    Last attempt to quit: 07/01/2015    Years since quitting: 5.7   Smokeless tobacco: Never  Vaping Use   Vaping Use: Never used  Substance and Sexual Activity   Alcohol use: Yes    Alcohol/week: 0.0 standard drinks    Comment: RARE   Drug use: No   Sexual activity: Not on file  Other Topics Concern   Not on file   Social History Narrative   Not on file   Social Determinants of Health   Financial Resource Strain: Not on file  Food Insecurity: Not on file  Transportation Needs: Not on file  Physical Activity: Not on file  Stress: Not on file  Social Connections: Not on file     Review of Systems  Constitutional:        Appetite has improved.  Eating better.   HENT:  Negative for congestion, sinus pressure and sore throat.   Eyes:  Negative for pain and visual disturbance.  Respiratory:  Negative for cough and chest tightness.        Breathing stable.   Cardiovascular:  Negative for chest pain, palpitations and leg swelling.  Gastrointestinal:  Negative for abdominal pain, nausea and vomiting.  Genitourinary:  Negative for difficulty urinating and dysuria.  Musculoskeletal:  Negative for joint swelling and myalgias.  Skin:  Negative for color change and rash.  Neurological:  Negative for dizziness, light-headedness and headaches.  Hematological:  Negative for adenopathy. Does not bruise/bleed easily.  Psychiatric/Behavioral:  Negative for agitation and dysphoric mood.       Objective:     BP 128/78   Pulse 74   Temp 97.9 F (36.6 C)   Resp 16   Ht 5' 6"  (1.676 m)   Wt 169 lb 6.4 oz (76.8 kg)   LMP 06/15/1977   SpO2 98%   BMI 27.34 kg/m  Wt Readings from Last 3 Encounters:  03/08/21 169 lb 6.4 oz (76.8 kg)  12/21/20 172 lb (78 kg)  12/18/20 172 lb (78 kg)    Physical Exam Vitals reviewed.  Constitutional:      General: She is not in acute distress.    Appearance: Normal appearance.  HENT:     Head: Normocephalic and atraumatic.     Right Ear: External ear normal.     Left Ear: External ear normal.  Eyes:     General: No scleral icterus.       Right eye: No discharge.        Left eye: No discharge.     Conjunctiva/sclera: Conjunctivae normal.  Neck:     Thyroid: No thyromegaly.  Cardiovascular:     Rate and Rhythm: Normal rate and regular rhythm.  Pulmonary:      Effort: No respiratory distress.     Breath sounds: Normal breath sounds. No wheezing.  Abdominal:     General: Bowel sounds are normal.     Palpations: Abdomen is soft.     Tenderness:  There is no abdominal tenderness.  Musculoskeletal:        General: No swelling or tenderness.     Cervical back: Neck supple. No tenderness.  Lymphadenopathy:     Cervical: No cervical adenopathy.  Skin:    Findings: No erythema or rash.  Neurological:     Mental Status: She is alert.  Psychiatric:        Mood and Affect: Mood normal.        Behavior: Behavior normal.     Outpatient Encounter Medications as of 03/08/2021  Medication Sig   clopidogrel (PLAVIX) 75 MG tablet Take 1 tablet (75 mg total) by mouth daily.   latanoprost (XALATAN) 0.005 % ophthalmic solution Place 1 drop into both eyes at bedtime.    levothyroxine (SYNTHROID) 100 MCG tablet TAKE 1 TABLET EVERY DAY   metoprolol tartrate (LOPRESSOR) 25 MG tablet Take 1 tablet (25 mg total) by mouth daily.   [DISCONTINUED] fluticasone-salmeterol (ADVAIR) 250-50 MCG/ACT AEPB INHALE 1 DOSE BY MOUTH TWICE DAILY   [DISCONTINUED] Lidocaine (HM LIDOCAINE PATCH) 4 % PTCH Apply 1 patch topically every 12 (twelve) hours.   [DISCONTINUED] VENTOLIN HFA 108 (90 Base) MCG/ACT inhaler INHALE 2 PUFFS BY MOUTH EVERY 6 HOURS AS NEEDED FOR WHEEZING FOR SHORTNESS OF BREATH   fluticasone-salmeterol (ADVAIR) 250-50 MCG/ACT AEPB INHALE 1 DOSE BY MOUTH TWICE DAILY   VENTOLIN HFA 108 (90 Base) MCG/ACT inhaler INHALE 2 PUFFS BY MOUTH EVERY 6 HOURS AS NEEDED FOR WHEEZING FOR SHORTNESS OF BREATH   No facility-administered encounter medications on file as of 03/08/2021.     Lab Results  Component Value Date   WBC 10.0 12/21/2020   HGB 14.2 12/21/2020   HCT 42.0 12/21/2020   PLT 244 12/21/2020   GLUCOSE 128 (H) 12/21/2020   CHOL 216 (H) 10/14/2020   TRIG 223.0 (H) 10/14/2020   HDL 35.70 (L) 10/14/2020   LDLDIRECT 144.0 10/14/2020   LDLCALC 87 08/13/2020    ALT 17 12/18/2020   AST 38 12/18/2020   NA 133 (L) 12/21/2020   K 4.2 12/21/2020   CL 98 12/21/2020   CREATININE 0.69 12/21/2020   BUN 9 12/21/2020   CO2 25 12/21/2020   TSH 0.26 (L) 08/13/2020   HGBA1C 7.0 (H) 10/14/2020   MICROALBUR 2.4 (H) 08/13/2020    US Venous Img Lower Bilateral  Result Date: 12/21/2020 CLINICAL DATA:  Bilateral lower extremity pain and edema. EXAM: BILATERAL LOWER EXTREMITY VENOUS DOPPLER ULTRASOUND TECHNIQUE: Gray-scale sonography with graded compression, as well as color Doppler and duplex ultrasound were performed to evaluate the lower extremity deep venous systems from the level of the common femoral vein and including the common femoral, femoral, profunda femoral, popliteal and calf veins including the posterior tibial, peroneal and gastrocnemius veins when visible. The superficial great saphenous vein was also interrogated. Spectral Doppler was utilized to evaluate flow at rest and with distal augmentation maneuvers in the common femoral, femoral and popliteal veins. COMPARISON:  None. FINDINGS: RIGHT LOWER EXTREMITY Common Femoral Vein: No evidence of thrombus. Normal compressibility, respiratory phasicity and response to augmentation. Saphenofemoral Junction: No evidence of thrombus. Normal compressibility and flow on color Doppler imaging. Profunda Femoral Vein: No evidence of thrombus. Normal compressibility and flow on color Doppler imaging. Femoral Vein: No evidence of thrombus. Normal compressibility, respiratory phasicity and response to augmentation. Popliteal Vein: No evidence of thrombus. Normal compressibility, respiratory phasicity and response to augmentation. Calf Veins: No evidence of thrombus. Normal compressibility and flow on color Doppler imaging. Superficial Great Saphenous  Vein: No evidence of thrombus. Normal compressibility. Venous Reflux:  None. Other Findings: No evidence of superficial thrombophlebitis or abnormal fluid collection. LEFT LOWER  EXTREMITY Common Femoral Vein: No evidence of thrombus. Normal compressibility, respiratory phasicity and response to augmentation. Saphenofemoral Junction: No evidence of thrombus. Normal compressibility and flow on color Doppler imaging. Profunda Femoral Vein: No evidence of thrombus. Normal compressibility and flow on color Doppler imaging. Femoral Vein: No evidence of thrombus. Normal compressibility, respiratory phasicity and response to augmentation. Popliteal Vein: No evidence of thrombus. Normal compressibility, respiratory phasicity and response to augmentation. Calf Veins: No evidence of thrombus. Normal compressibility and flow on color Doppler imaging. Superficial Great Saphenous Vein: No evidence of thrombus. Normal compressibility. Venous Reflux:  None. Other Findings: No evidence of superficial thrombophlebitis or abnormal fluid collection. IMPRESSION: No evidence of deep venous thrombosis in either lower extremity. Electronically Signed   By: Aletta Edouard M.D.   On: 12/21/2020 16:52       Assessment & Plan:   Problem List Items Addressed This Visit     Anemia    Follow cbc.       Asthma    Breathing stable.       Relevant Medications   VENTOLIN HFA 108 (90 Base) MCG/ACT inhaler   fluticasone-salmeterol (ADVAIR) 250-50 MCG/ACT AEPB   CAD (coronary artery disease)    Followed by Dr Rockey Situ.  Stable.  Continue plavix and metoprolol.        Carotid artery disease ([place])    Has been followed by [facility] - 40-59% bilaterally.  Last seen 10/2019. Recommended f/u in one year. Need to continue f/u.       Crohn's disease ([place])    Saw GI.  Evaluated as outlined.  Declines any further testing or f/u with GI.        Diabetes mellitus ([place])    Low carb diet and exercise.  Follow met b and a1c.  Declines medication.  Lab Results  Component Value Date   HGBA1C 7.0 (H) 10/14/2020       Health care maintenance    Physical today 03/08/21.  Declines mammogram.  Colonoscopy 08/2015.   Recommended f/u in 10 years.        Hypercholesterolemia    Was previously on Crestor and Zetia.  Off now.  Follow-up fast lipid profile.       Hypertension    Continue metoprolol.  Blood pressure as outlined.  No changes.  Follow pressure. Follow metabolic panel.       Hypothyroidism    On thyroid replacement.  Follow tsh.       Multiple sclerosis ([place])    Has desired no further intervention.  Follow.       Pancreatic insufficiency    Declines taking pancreatic enzymes.  She is doing better.  Eating better.  Follow.       Tobacco abuse    Have discussed the need to quit smoking.  Desires to continue.        Einar Pheasant, MD

## 2021-03-08 NOTE — Assessment & Plan Note (Addendum)
Physical today 03/08/21.  Declines mammogram.  Colonoscopy 08/2015.  Recommended f/u in 10 years.

## 2021-03-20 ENCOUNTER — Encounter: Payer: Self-pay | Admitting: Internal Medicine

## 2021-03-20 DIAGNOSIS — K8689 Other specified diseases of pancreas: Secondary | ICD-10-CM | POA: Insufficient documentation

## 2021-03-20 NOTE — Assessment & Plan Note (Signed)
Continue metoprolol.  Blood pressure as outlined.  No changes.  Follow pressure. Follow metabolic panel.

## 2021-03-20 NOTE — Assessment & Plan Note (Signed)
Followed by Dr Rockey Situ.  Stable.  Continue plavix and metoprolol.

## 2021-03-20 NOTE — Assessment & Plan Note (Signed)
Follow cbc.  

## 2021-03-20 NOTE — Assessment & Plan Note (Signed)
Has been followed by [facility] - 40-59% bilaterally.  Last seen 10/2019. Recommended f/u in one year. Need to continue f/u.

## 2021-03-20 NOTE — Assessment & Plan Note (Signed)
On thyroid replacement.  Follow tsh.  

## 2021-03-20 NOTE — Assessment & Plan Note (Signed)
Saw GI.  Evaluated as outlined.  Declines any further testing or f/u with GI.

## 2021-03-20 NOTE — Assessment & Plan Note (Signed)
Declines taking pancreatic enzymes.  She is doing better.  Eating better.  Follow.

## 2021-03-20 NOTE — Assessment & Plan Note (Signed)
Have discussed the need to quit smoking.  Desires to continue.

## 2021-03-20 NOTE — Assessment & Plan Note (Signed)
Has desired no further intervention.  Follow.

## 2021-03-20 NOTE — Assessment & Plan Note (Signed)
Low carb diet and exercise.  Follow met b and a1c.  Declines medication.  Lab Results  Component Value Date   HGBA1C 7.0 (H) 10/14/2020

## 2021-03-20 NOTE — Assessment & Plan Note (Signed)
Was previously on Crestor and Zetia.  Off now.  Follow-up fast lipid profile.

## 2021-03-20 NOTE — Assessment & Plan Note (Signed)
Breathing stable.

## 2021-03-28 DIAGNOSIS — G459 Transient cerebral ischemic attack, unspecified: Secondary | ICD-10-CM | POA: Diagnosis not present

## 2021-03-28 DIAGNOSIS — I10 Essential (primary) hypertension: Secondary | ICD-10-CM | POA: Diagnosis not present

## 2021-03-28 DIAGNOSIS — R4781 Slurred speech: Secondary | ICD-10-CM | POA: Diagnosis not present

## 2021-04-05 ENCOUNTER — Ambulatory Visit (INDEPENDENT_AMBULATORY_CARE_PROVIDER_SITE_OTHER): Payer: Medicare Other

## 2021-04-05 ENCOUNTER — Other Ambulatory Visit (INDEPENDENT_AMBULATORY_CARE_PROVIDER_SITE_OTHER): Payer: Self-pay | Admitting: Vascular Surgery

## 2021-04-05 ENCOUNTER — Ambulatory Visit (INDEPENDENT_AMBULATORY_CARE_PROVIDER_SITE_OTHER): Payer: Medicare Other | Admitting: Vascular Surgery

## 2021-04-05 ENCOUNTER — Other Ambulatory Visit: Payer: Self-pay

## 2021-04-05 ENCOUNTER — Encounter (INDEPENDENT_AMBULATORY_CARE_PROVIDER_SITE_OTHER): Payer: Self-pay | Admitting: Vascular Surgery

## 2021-04-05 VITALS — BP 146/65 | HR 64 | Ht 66.0 in | Wt 166.0 lb

## 2021-04-05 DIAGNOSIS — E7800 Pure hypercholesterolemia, unspecified: Secondary | ICD-10-CM

## 2021-04-05 DIAGNOSIS — I6523 Occlusion and stenosis of bilateral carotid arteries: Secondary | ICD-10-CM

## 2021-04-05 DIAGNOSIS — I10 Essential (primary) hypertension: Secondary | ICD-10-CM | POA: Diagnosis not present

## 2021-04-05 DIAGNOSIS — I25118 Atherosclerotic heart disease of native coronary artery with other forms of angina pectoris: Secondary | ICD-10-CM | POA: Diagnosis not present

## 2021-04-05 DIAGNOSIS — I872 Venous insufficiency (chronic) (peripheral): Secondary | ICD-10-CM

## 2021-04-05 NOTE — Progress Notes (Signed)
MRN : [id number]  Haley Santos is a 75 y.o. (12-29-1945) female who presents with chief complaint of check carotid arteries.  History of Present Illness:  The patient is seen for evaluation of carotid stenosis. Approximately 8 days before this office visit she experienced aphasia and some weakness.  Her strength is returned but her aphasia has persisted.   The patient denies interval amaurosis fugax. There is no interval history of TIA symptoms or focal motor deficits. There is a prior documented CVA.   There is no history of migraine headaches. There is no history of seizures.   The patient is taking enteric-coated aspirin 81 mg daily.   She notes her swelling has essentially resolved unless "I stand in the heat for a long time"   The patient has a history of coronary artery disease, no recent episodes of angina or shortness of breath. The patient denies PAD or claudication symptoms. There is a history of hyperlipidemia which is being treated with a statin.    Carotid duplex shows 1-39% bilateral ICA stenosis, there is a >50% stenosis of the left common carotid   No outpatient medications have been marked as taking for the 04/05/21 encounter (Appointment) with Delana Meyer, Dolores Lory, MD.    Past Medical History:  Diagnosis Date   Asthma with bronchitis    Breast cancer ([place]) 2014   CAD (coronary artery disease)    a. 08/2014: inf STEMI s/p DES to RCA.    COPD (chronic obstructive pulmonary disease) (HCC)    Crohn's disease (HCC)    Dyspnea    DOE   GERD (gastroesophageal reflux disease)    Glaucoma    Glucose intolerance (impaired glucose tolerance)    a. HgA1c 6.2 during 08/2014 admission    Hypercholesterolemia    Hypertension    Hyperthyroidism    a. s/p ablation maintained on synthroid   Hypothyroidism    Multiple sclerosis ([place])    Myocardial infarction ([place])    2016   Neuromuscular disorder ([place])    MS   Tobacco abuse     Past Surgical History:  Procedure  Laterality Date   AUGMENTATION MAMMAPLASTY Left    BREAST ENHANCEMENT SURGERY  1981   [place]   CATARACT EXTRACTION W/PHACO Left 07/19/2017   Procedure: CATARACT EXTRACTION PHACO AND INTRAOCULAR LENS PLACEMENT ([place]);  Surgeon: Birder Robson, MD;  Location: [facility] ORS;  Service: Ophthalmology;  Laterality: Left;  [place] 00:49.0 AP% 14.5 CDE 7.10 Fluid Pack Lot # [id number]   CATARACT EXTRACTION W/PHACO Right 08/09/2017   Procedure: CATARACT EXTRACTION PHACO AND INTRAOCULAR LENS PLACEMENT ([facility]);  Surgeon: Birder Robson, MD;  Location: [facility] ORS;  Service: Ophthalmology;  Laterality: Right;  [place] 00:42 AP% 13.8 CDE 5.88 Fluid pack lot #[id number]   COLONOSCOPY  2010   Dr Vira Agar   CORONARY ANGIOPLASTY     STENT 2016   LEFT HEART CATHETERIZATION WITH CORONARY ANGIOGRAM N/A 09/01/2014   Procedure: LEFT HEART CATHETERIZATION WITH CORONARY ANGIOGRAM;  Surgeon: Peter M Martinique, MD;  Location: [facility] CATH LAB;  Service: Cardiovascular;  Laterality: N/A;   MASTECTOMY Right 2014   PATRICAL MASTECOMY RIGHT RAD CHEMO   PORTACATH PLACEMENT  11/07/2012   RIGHT OOPHORECTOMY  1979   TONSILLECTOMY AND ADENOIDECTOMY  1957    Social History Social History   Tobacco Use   Smoking status: Every Day    Packs/day: 1.00    Years: 40.00    Pack years: 40.00    Types: Cigarettes    Last  attempt to quit: 07/01/2015    Years since quitting: 5.7   Smokeless tobacco: Never  Vaping Use   Vaping Use: Never used  Substance Use Topics   Alcohol use: Yes    Alcohol/week: 0.0 standard drinks    Comment: RARE   Drug use: No    Family History Family History  Problem Relation Age of Onset   Diabetes Mother    Heart disease Mother        myocardial infarction   Glaucoma Mother    Berenice Primas' disease Mother    Heart attack Mother 23   Lung disease Father    Heart disease Other        half sister   Peripheral vascular disease Other        half sister   Breast cancer Neg Hx     Allergies  Allergen  Reactions   Statins Other (See Comments)    Muscle weakness/pain. Trials of multiple agents. Tolerating rosuvastatin   Augmentin [Amoxicillin-Pot Clavulanate]     Pt reported had rash after taking.  (notifed 08/13/20 appt)   Clindamycin/Lincomycin Other (See Comments)    Digestive upset   Codeine Other (See Comments)    "hallucinations"   Shellfish Allergy Nausea And Vomiting    Felt goofy headed   Valium [Diazepam] Anxiety and Other (See Comments)    hyperactivity     REVIEW OF SYSTEMS (Negative unless checked)  Constitutional: [] Weight loss  [] Fever  [] Chills Cardiac: [] Chest pain   [] Chest pressure   [] Palpitations   [] Shortness of breath when laying flat   [] Shortness of breath with exertion. Vascular:  [] Pain in legs with walking   [] Pain in legs at rest  [] History of DVT   [] Phlebitis   [] Swelling in legs   [] Varicose veins   [] Non-healing ulcers Pulmonary:   [] Uses home oxygen   [] Productive cough   [] Hemoptysis   [] Wheeze  [] COPD   [] Asthma Neurologic:  [] Dizziness   [] Seizures   [x] History of stroke   [] History of TIA  [x] Aphasia   [] Vissual changes   [] Weakness or numbness in arm   [] Weakness or numbness in leg Musculoskeletal:   [] Joint swelling   [] Joint pain   [] Low back pain Hematologic:  [] Easy bruising  [] Easy bleeding   [] Hypercoagulable state   [] Anemic Gastrointestinal:  [] Diarrhea   [] Vomiting  [] Gastroesophageal reflux/heartburn   [] Difficulty swallowing. Genitourinary:  [] Chronic kidney disease   [] Difficult urination  [] Frequent urination   [] Blood in urine Skin:  [] Rashes   [] Ulcers  Psychological:  [] History of anxiety   []  History of major depression.  Physical Examination  There were no vitals filed for this visit. There is no height or weight on file to calculate BMI. Gen: WD/WN, NAD Head: [place]/AT, No temporalis wasting.  Ear/Nose/Throat: Hearing grossly intact, nares w/o erythema or drainage Eyes: PER, EOMI, sclera nonicteric.  Neck: Supple, no masses.   No bruit or JVD.  Pulmonary:  Good air movement, no audible wheezing, no use of accessory muscles.  Cardiac: RRR, normal S1, S2, no Murmurs. Vascular:   left carotid  bruit Vessel Right Left  Radial Palpable Palpable  Carotid Palpable Palpable  Gastrointestinal: soft, non-distended. No guarding/no peritoneal signs.  Musculoskeletal: M/S 5/5 throughout.  No visible deformity.  Neurologic: CN 2-12 intact. Pain and light touch intact in extremities.  Symmetrical.  Speech is fluent. Motor exam as listed above. Psychiatric: Judgment intact, Mood & affect appropriate for pt's clinical situation. Dermatologic: No rashes or ulcers noted.  No  changes consistent with cellulitis.   CBC Lab Results  Component Value Date   WBC 10.0 12/21/2020   HGB 14.2 12/21/2020   HCT 42.0 12/21/2020   MCV 84.5 12/21/2020   PLT 244 12/21/2020    BMET    Component Value Date/Time   NA 133 (L) 12/21/2020 1319   NA 140 12/18/2020 1330   NA 139 05/01/2014 1008   K 4.2 12/21/2020 1319   K 4.0 05/01/2014 1008   CL 98 12/21/2020 1319   CL 102 05/01/2014 1008   CO2 25 12/21/2020 1319   CO2 30 05/01/2014 1008   GLUCOSE 128 (H) 12/21/2020 1319   GLUCOSE 107 (H) 05/01/2014 1008   BUN 9 12/21/2020 1319   BUN 7 (L) 12/18/2020 1330   BUN 13 05/01/2014 1008   CREATININE 0.69 12/21/2020 1319   CREATININE 0.91 05/01/2014 1008   CALCIUM 9.3 12/21/2020 1319   CALCIUM 9.1 05/01/2014 1008   GFRNONAA >60 12/21/2020 1319   GFRNONAA >60 05/01/2014 1008   GFRNONAA 57 (L) 01/29/2014 1021   GFRAA >60 07/04/2015 1445   GFRAA >60 05/01/2014 1008   GFRAA >60 01/29/2014 1021   CrCl cannot be calculated (Patient's most recent lab result is older than the maximum 21 days allowed.).  COAG No results found for: INR, PROTIME  Radiology No results found.   Assessment/Plan 1. Bilateral carotid artery stenosis The patient is symptomatic with respect to the left carotid stenosis.  However, the patient has a lesion the  is >50% but it is in the common carotid.  Patient should undergo CT angiography of the carotid arteries to define the degree of stenosis of the left common and internal carotid arteries bilaterally and the anatomic suitability for surgery vs. intervention.  If the patient does indeed need surgery cardiac clearance will be required, once cleared the patient will be scheduled for surgery.  The risks, benefits and alternative therapies were reviewed in detail with the patient.  All questions were answered.  The patient agrees to proceed with imaging.  Continue antiplatelet therapy as prescribed. Continue management of CAD, HTN and Hyperlipidemia. Healthy heart diet, encouraged exercise at least 4 times per week.    This was not discussed at the time of the visit so I will contact the patient and let her know  - Ambulatory referral to Speech Therapy  2. Coronary artery disease of native artery of native heart with stable angina pectoris (HCC) Continue cardiac and antihypertensive medications as already ordered and reviewed, no changes at this time.  Continue statin as ordered and reviewed, no changes at this time  Nitrates PRN for chest pain   3. Chronic venous insufficiency  No surgery or intervention at this point in time.    I have reviewed my discussion with the patient regarding lymphedema and why it  causes symptoms.  Patient will continue wearing graduated compression stockings class 1 (20-30 mmHg) on a daily basis a prescription was given. The patient is reminded to put the stockings on first thing in the morning and removing them in the evening. The patient is instructed specifically not to sleep in the stockings.   In addition, behavioral modification throughout the day will be continued.  This will include frequent elevation (such as in a recliner), use of over the counter pain medications as needed and exercise such as walking.  I have reviewed systemic causes for chronic edema  such as liver, kidney and cardiac etiologies and there does not appear to be any significant  changes in these organ systems over the past year.  The patient is under the impression that these organ systems are all stable and unchanged.    The patient will continue aggressive use of the  lymph pump.  This will continue to improve the edema control and prevent sequela such as ulcers and infections.   The patient will follow-up with me on an annual basis.    4. Primary hypertension Continue antihypertensive medications as already ordered, these medications have been reviewed and there are no changes at this time.   5. Hypercholesterolemia Continue statin as ordered and reviewed, no changes at this time     Hortencia Pilar, MD  04/05/2021 1:44 PM

## 2021-04-11 ENCOUNTER — Encounter (INDEPENDENT_AMBULATORY_CARE_PROVIDER_SITE_OTHER): Payer: Self-pay | Admitting: Vascular Surgery

## 2021-04-15 ENCOUNTER — Ambulatory Visit: Payer: Medicare Other | Attending: Vascular Surgery | Admitting: Speech Pathology

## 2021-04-15 ENCOUNTER — Other Ambulatory Visit (INDEPENDENT_AMBULATORY_CARE_PROVIDER_SITE_OTHER): Payer: Self-pay | Admitting: Vascular Surgery

## 2021-04-15 ENCOUNTER — Other Ambulatory Visit: Payer: Self-pay

## 2021-04-15 ENCOUNTER — Telehealth (INDEPENDENT_AMBULATORY_CARE_PROVIDER_SITE_OTHER): Payer: Self-pay | Admitting: Vascular Surgery

## 2021-04-15 ENCOUNTER — Encounter: Payer: Self-pay | Admitting: Speech Pathology

## 2021-04-15 DIAGNOSIS — R41841 Cognitive communication deficit: Secondary | ICD-10-CM | POA: Insufficient documentation

## 2021-04-15 DIAGNOSIS — R4701 Aphasia: Secondary | ICD-10-CM | POA: Insufficient documentation

## 2021-04-15 NOTE — Therapy (Signed)
[place] MAIN [facility] SERVICES [address] [place], [place], [address] Phone: [phone]   Fax:  [phone]  Speech Language Pathology Evaluation  Patient Details  Name: DENIECE RANKIN MRN: [id number] Date of Birth: [birth year] Referring Provider (SLP): MD Hortencia Pilar   Encounter Date: 04/15/2021   End of Session - 04/15/21 1438     Visit Number 1    Number of Visits 25    Date for SLP Re-Evaluation 07/08/21    Authorization Type Medicare    Authorization Time Period 04/15/2021-07/08/2021    Authorization - Visit Number 1    Authorization - Number of Visits 25    Progress Note Due on Visit 10    SLP Start Time 1300    SLP Stop Time  1400    SLP Time Calculation (min) 60 min    Activity Tolerance Patient tolerated treatment well             Past Medical History:  Diagnosis Date   Asthma with bronchitis    Breast cancer ([place]) 2014   CAD (coronary artery disease)    a. 08/2014: inf STEMI s/p DES to RCA.    COPD (chronic obstructive pulmonary disease) (HCC)    Crohn's disease (HCC)    Dyspnea    DOE   GERD (gastroesophageal reflux disease)    Glaucoma    Glucose intolerance (impaired glucose tolerance)    a. HgA1c 6.2 during 08/2014 admission    Hypercholesterolemia    Hypertension    Hyperthyroidism    a. s/p ablation maintained on synthroid   Hypothyroidism    Multiple sclerosis ([place])    Myocardial infarction ([place])    2016   Neuromuscular disorder ([place])    MS   Tobacco abuse     Past Surgical History:  Procedure Laterality Date   AUGMENTATION MAMMAPLASTY Left    BREAST ENHANCEMENT SURGERY  1981   [place]   CATARACT EXTRACTION W/PHACO Left 07/19/2017   Procedure: CATARACT EXTRACTION PHACO AND INTRAOCULAR LENS PLACEMENT ([place]);  Surgeon: Birder Robson, MD;  Location: [facility] ORS;  Service: Ophthalmology;  Laterality: Left;  [place] 00:49.0 AP% 14.5 CDE 7.10 Fluid Pack Lot # [id number]   CATARACT EXTRACTION  W/PHACO Right 08/09/2017   Procedure: CATARACT EXTRACTION PHACO AND INTRAOCULAR LENS PLACEMENT (IOC);  Surgeon: Birder Robson, MD;  Location: [facility] ORS;  Service: Ophthalmology;  Laterality: Right;  [place] 00:42 AP% 13.8 CDE 5.88 Fluid pack lot #[id number] H   COLONOSCOPY  2010   Dr Vira Agar   CORONARY ANGIOPLASTY     STENT 2016   LEFT HEART CATHETERIZATION WITH CORONARY ANGIOGRAM N/A 09/01/2014   Procedure: LEFT HEART CATHETERIZATION WITH CORONARY ANGIOGRAM;  Surgeon: Peter M Martinique, MD;  Location: [facility] CATH LAB;  Service: Cardiovascular;  Laterality: N/A;   MASTECTOMY Right 2014   PATRICAL MASTECOMY RIGHT RAD CHEMO   PORTACATH PLACEMENT  11/07/2012   RIGHT OOPHORECTOMY  1979   TONSILLECTOMY AND ADENOIDECTOMY  1957    There were no vitals filed for this visit.   Subjective Assessment - 04/15/21 1421     Subjective Patient presents independently for initial evaluation to address "difficulties mixing up words" and confusion during reading. Patient stated these issues started on November 13 when she was reading at [facility] before church when suddenly she couldn't understand anything she was reading. EMS was called and rec'd she go to the ED though patient reported that she declined going to ED 2' preference to follow up with her own  doctors as they know her and she thinks they are the best. Patient was tearful throughout exam, expressed fear and concern re: health and desire to determine what is going on as well as desire to get better.    Special Tests No imaging has been completed. Patient reported that her referring MD has also rec'd consult with Dr. Shah/neurology though an appointment has not yet been set up.    Currently in Pain? No/denies                SLP Evaluation [facility] - 04/15/21 1421       SLP Visit Information   SLP Received On 04/15/21    Referring Provider (SLP) MD Hortencia Pilar    Onset Date March 28 2021    Medical Diagnosis --   Per MD referral "aphasia s/p stroke"      Subjective   Patient/Family Stated Goal get better      Prior Functional Status    Lives With Alone    IT trainer work   Engineer, production in [place]   Memory Impaired    Memory Impairment Decreased short term memory;Retrieval deficit      Standardized Assessments   Standardized Assessments  Western Aphasia Battery revised;Western Aphasia Battery;Other Assessment    Western Aphasia Battery  Spontaneous speech score: 20/20    Auditory Verbal Comprehension Score 8.9/10   Repetition score 8.2/10    Naming and Word Finding 10/10  Aphasia Quotient (94.2)    Other Assessment SLUMS: Score 25/30 Score is indicative of mild neurocognitive impairment in persons with high school education (patient has a bachelors degree and some masters courses.               SLP Education - 04/15/21 1438     Education Details Benefit of neurology consult and head/brain imaging to guide therapy.    Person(s) Educated Patient    Methods Explanation;Demonstration    Comprehension Verbalized understanding              SLP Short Term Goals - 04/15/21 1503       SLP SHORT TERM GOAL #1   Title Patient will complete formal cognitive linguistic evaluation    Status New      SLP SHORT TERM GOAL #2   Title Patient will utilzie trained memory strategies for working memory necessary for functional iADLS with 42% accuracy IND    Baseline No strategy usage    Status New              SLP Long Term Goals - 04/15/21 1505       SLP LONG TERM GOAL #1   Title Patient will demonstrate improved cognitive linguistic function for IND completion of iADLS tasks in home/community environments    Status New              Plan - 04/15/21 1449     Clinical Impression Statement Patient presents with mild cognitive linguistic impairments as per completion of the [place] Examination of Mental Status. Patient presents with functional verbal expressive and receptive speech as  demonstrated with administration of Western Aphasia Battery per scores above. Suspect minimal errors demonstrated on exam were related to memory deficits as during sequential command tasks patient required repetition with subsequent successful completion and during repetition task of elongated sentence patient only repeated initial word before stating "that's it that's all I can remember." Screening of reading and writing at sentence level were completed  and 100% accuracy x3 each. Given suspected nature of deficits on WAB, SLP administered VA SLUMS--a neurocognitive screening tool--patient demonstrated a score of 25/30 which per SLUMS normative values is indicative of mild cognitive impairment in people with high school educations. Errors demonstrated on SLUMS were all related to memory. Patient would benefit from formal cognitive linguistic evaluation to better identify deficits and patient would benefit from further evaluation of higher level reading and writing given patient reported symptoms. Recommend follow up with neurologist with imaging to determine underlying cause of above cognitive linguistic deficits which would assist in therapeutic POC. Patient is highly motivated to improve in deficits above and would benefit from skilled SLP intervention to address recall and cognitive communication necessary for independence in the home and community environments.    Speech Therapy Frequency 2x / week    Duration 12 weeks    Treatment/Interventions Internal/external aids;SLP instruction and feedback;Language facilitation;Functional tasks;Compensatory strategies;Patient/family education    Potential to Achieve Goals Fair    Potential Considerations Ability to learn/carryover information;Previous level of function;Cooperation/participation level;Severity of impairments    Consulted and Agree with Plan of Care Patient             Patient will benefit from skilled therapeutic intervention in order to  improve the following deficits and impairments:   Cognitive communication deficit  Aphasia    Problem List Patient Active Problem List   Diagnosis Date Noted   Pancreatic insufficiency 03/20/2021   Diarrhea 12/18/2020   Vaginal itching 08/15/2020   Lymphedema 09/11/2017   Chronic venous insufficiency 09/11/2017   Anemia 03/12/2017   Health care maintenance 11/11/2016   Cough 07/01/2016   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[place]) 09/15/2015   Sinusitis, acute frontal 06/30/2015   Left arm pain 05/08/2015   Pain of left calf 05/08/2015   Diabetes mellitus ([place]) 05/08/2015   Carotid artery disease ([place]) 09/19/2014   CAD (coronary artery disease) 09/19/2014   GERD (gastroesophageal reflux disease)    Crohn's disease ([place])    ST elevation myocardial infarction (STEMI) of inferior wall ([place]) 09/02/2014   Tobacco abuse 09/02/2014   Carotid bruit 03/31/2014   Joint pain 10/06/2013   Sinus tachycardia 12/21/2012   Breast nodule 09/20/2012   Hypertension 06/16/2012   Hypercholesterolemia 06/16/2012   Asthma 06/16/2012   Multiple sclerosis ([place]) 06/16/2012   Psoriasis 06/16/2012   Hypothyroidism 06/16/2012   Glaucoma 06/16/2012   [place] L. Kyliana Standen, M.A. [place], CCC-SLP 04/15/2021, 3:08 PM  NOTE: follow up SLP services will be completed with Happi Rutherford Nail, should there be questions or concerns re: SLP services those seeking may reach out to [facility] via the number below.   [place] MA[place] [facility] SERVICES [address] [place], [place], [address] Phone: [phone]   Fax:  [phone]  Name: ANNETH BRUNELL MRN: [id number] Date of Birth: 06-24-1945

## 2021-04-15 NOTE — Patient Instructions (Signed)
Educated patient re: s/s of stroke and to seek immediate medical help  (911/ER) if worsening symptoms occur.

## 2021-04-15 NOTE — Telephone Encounter (Signed)
Pt inquiring about a referral that was supposed to be placed Dr. Manuella Ghazi @ neurology - please advise pt. [phone]

## 2021-04-16 NOTE — Telephone Encounter (Signed)
Dr Delana Meyer has been made aware

## 2021-04-19 ENCOUNTER — Telehealth (INDEPENDENT_AMBULATORY_CARE_PROVIDER_SITE_OTHER): Payer: Self-pay | Admitting: Vascular Surgery

## 2021-04-19 NOTE — Telephone Encounter (Signed)
Per note from Dr. Delana Meyer, he would like for pt to come back in and see him. He would like to go ahead and have a CT done. LVM for pt TCB and schedule appt with GS.

## 2021-04-21 ENCOUNTER — Other Ambulatory Visit: Payer: Self-pay

## 2021-04-21 ENCOUNTER — Ambulatory Visit: Payer: Medicare Other | Admitting: Speech Pathology

## 2021-04-21 DIAGNOSIS — R41841 Cognitive communication deficit: Secondary | ICD-10-CM

## 2021-04-21 DIAGNOSIS — R4701 Aphasia: Secondary | ICD-10-CM | POA: Diagnosis not present

## 2021-04-22 ENCOUNTER — Ambulatory Visit (INDEPENDENT_AMBULATORY_CARE_PROVIDER_SITE_OTHER): Payer: Medicare Other | Admitting: Vascular Surgery

## 2021-04-22 NOTE — Therapy (Signed)
[place] MAIN [facility] SERVICES [address] [place], [place], [address] Phone: [phone]   Fax:  [phone]  Speech Language Pathology Treatment  Patient Details  Name: Haley Santos MRN: [id number] Date of Birth: 04-07-1946 Referring Provider (SLP): MD Hortencia Pilar   Encounter Date: 04/21/2021   End of Session - 04/22/21 0640     Visit Number 2    Number of Visits 25    Date for SLP Re-Evaluation 07/08/21    Authorization Type Medicare    Authorization Time Period 04/15/2021-07/08/2021    Authorization - Visit Number 2    Progress Note Due on Visit 10    SLP Start Time 1500    SLP Stop Time  1600    SLP Time Calculation (min) 60 min    Activity Tolerance Patient tolerated treatment well             Past Medical History:  Diagnosis Date   Asthma with bronchitis    Breast cancer ([place]) 2014   CAD (coronary artery disease)    a. 08/2014: inf STEMI s/p DES to RCA.    COPD (chronic obstructive pulmonary disease) (HCC)    Crohn's disease (HCC)    Dyspnea    DOE   GERD (gastroesophageal reflux disease)    Glaucoma    Glucose intolerance (impaired glucose tolerance)    a. HgA1c 6.2 during 08/2014 admission    Hypercholesterolemia    Hypertension    Hyperthyroidism    a. s/p ablation maintained on synthroid   Hypothyroidism    Multiple sclerosis ([place])    Myocardial infarction ([place])    2016   Neuromuscular disorder ([place])    MS   Tobacco abuse     Past Surgical History:  Procedure Laterality Date   AUGMENTATION MAMMAPLASTY Left    BREAST ENHANCEMENT SURGERY  1981   [place]   CATARACT EXTRACTION W/PHACO Left 07/19/2017   Procedure: CATARACT EXTRACTION PHACO AND INTRAOCULAR LENS PLACEMENT ([place]);  Surgeon: Birder Robson, MD;  Location: [facility] ORS;  Service: Ophthalmology;  Laterality: Left;  [place] 00:49.0 AP% 14.5 CDE 7.10 Fluid Pack Lot # [id number]   CATARACT EXTRACTION W/PHACO Right 08/09/2017   Procedure:  CATARACT EXTRACTION PHACO AND INTRAOCULAR LENS PLACEMENT (IOC);  Surgeon: Birder Robson, MD;  Location: [facility] ORS;  Service: Ophthalmology;  Laterality: Right;  [place] 00:42 AP% 13.8 CDE 5.88 Fluid pack lot #[id number] H   COLONOSCOPY  2010   Dr Vira Agar   CORONARY ANGIOPLASTY     STENT 2016   LEFT HEART CATHETERIZATION WITH CORONARY ANGIOGRAM N/A 09/01/2014   Procedure: LEFT HEART CATHETERIZATION WITH CORONARY ANGIOGRAM;  Surgeon: Peter M Martinique, MD;  Location: [facility] CATH LAB;  Service: Cardiovascular;  Laterality: N/A;   MASTECTOMY Right 2014   PATRICAL MASTECOMY RIGHT RAD CHEMO   PORTACATH PLACEMENT  11/07/2012   RIGHT OOPHORECTOMY  1979   TONSILLECTOMY AND ADENOIDECTOMY  1957    There were no vitals filed for this visit.   Subjective Assessment - 04/22/21 0635     Subjective "I am fearful that something other than a stroke is wrong with my brain," pt tearful, pt reports asking for MRI to r/o or confirm neurological insults    Currently in Pain? No/denies                   ADULT SLP TREATMENT - 04/22/21 0001       Treatment Provided   Treatment provided Cognitive-Linquistic      Cognitive-Linquistic  Treatment   Treatment focused on Cognition;Patient/family/caregiver education    Skilled Treatment Results of the SLUMS from previous session were shared with pt. Given score on SLUMS, full cognition evaluation is indicated. The Cognitive Linguistic Quick Test was administered, see results below in impressions statement.              SLP Education - 04/22/21 [phone]     Education Details results of assessment, benefit of neurology consult and head/brain imaging to guide therapy    Person(s) Educated Patient    Methods Explanation;Demonstration    Comprehension Verbalized understanding              SLP Short Term Goals - 04/15/21 1503       SLP SHORT TERM GOAL #1   Title Patient will complete formal cognitive linguistic evaluation    Status New      SLP SHORT  TERM GOAL #2   Title Patient will utilzie trained memory strategies for working memory necessary for functional iADLS with 01% accuracy IND    Baseline No strategy usage    Status New              SLP Long Term Goals - 04/15/21 1505       SLP LONG TERM GOAL #1   Title Patient will demonstrate improved cognitive linguistic function for IND completion of iADLS tasks in home/community environments    Status New              Plan - 04/22/21 0640     Clinical Impression Statement Cognitive Linguistic Quick Test  AGE - 70-89   The Cognitive Linguistic Quick Test (CLQT) was administered to assess the relative status of five cognitive domains: attention, memory, language, executive functioning, and visuospatial skills. Scores from 10 tasks were used to estimate severity ratings (standardized for age groups 18-69 years and 70-89 years) for each domain, a clock drawing task, as well as an overall composite severity rating of cognition.       Task Score Criterion Cut Scores  Personal Facts 8/8 8  Symbol Cancellation 12/12 10  Confrontation Naming 10/10 10  Clock Drawing  13/13 11  Story Retelling 4/10 5  Symbol Trails 9/10 6  Generative Naming 6/9 4  Design Memory 6/6 4  [place]  5/8 4  Design Generation 3/13 5    Cognitive Domain Composite Score Severity Rating  Attention 178/215 WNL  Memory 146/185 WNL  Executive Function 23/40 WNL  Language 28/37 WNL  Visuospatial Skills 84/105 WNL  Clock Drawing  13/13 WNL  Composite Severity Rating  WNL    Although pt's performance with solidly within the normal range, she appeared very anxious throughout. She describes being fearful of unknown neurological etiologies. Will communicate with pt's referring physician about possibly ordering an MRI as well as placing a neurology consult.      Speech Therapy Frequency 2x / week    Duration 12 weeks    Treatment/Interventions Internal/external aids;SLP instruction and feedback;Language  facilitation;Functional tasks;Compensatory strategies;Patient/family education    Potential to Achieve Goals Good    Consulted and Agree with Plan of Care Patient             Patient will benefit from skilled therapeutic intervention in order to improve the following deficits and impairments:   Cognitive communication deficit    Problem List Patient Active Problem List   Diagnosis Date Noted   Pancreatic insufficiency 03/20/2021   Diarrhea 12/18/2020   Vaginal itching 08/15/2020  Lymphedema 09/11/2017   Chronic venous insufficiency 09/11/2017   Anemia 03/12/2017   Health care maintenance 11/11/2016   Cough 07/01/2016   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[place]) 09/15/2015   Sinusitis, acute frontal 06/30/2015   Left arm pain 05/08/2015   Pain of left calf 05/08/2015   Diabetes mellitus ([place]) 05/08/2015   Carotid artery disease ([place]) 09/19/2014   CAD (coronary artery disease) 09/19/2014   GERD (gastroesophageal reflux disease)    Crohn's disease ([place])    ST elevation myocardial infarction (STEMI) of inferior wall ([place]) 09/02/2014   Tobacco abuse 09/02/2014   Carotid bruit 03/31/2014   Joint pain 10/06/2013   Sinus tachycardia 12/21/2012   Breast nodule 09/20/2012   Hypertension 06/16/2012   Hypercholesterolemia 06/16/2012   Asthma 06/16/2012   Multiple sclerosis ([place]) 06/16/2012   Psoriasis 06/16/2012   Hypothyroidism 06/16/2012   Glaucoma 06/16/2012   Eashan Schipani B. Rutherford Nail M.S., CCC-SLP, [facility] Pathologist Rehabilitation Services Office [phone]  Stormy Fabian, [place] 04/22/2021, 6:43 AM  [place] MAIN [facility] SERVICES [address] [place], [place], [address] Phone: [phone]   Fax:  [phone]   Name: Haley Santos MRN: [id number] Date of Birth: [birth year]

## 2021-04-26 ENCOUNTER — Other Ambulatory Visit: Payer: Self-pay

## 2021-04-26 ENCOUNTER — Ambulatory Visit: Payer: Medicare Other | Admitting: Speech Pathology

## 2021-04-26 DIAGNOSIS — R4701 Aphasia: Secondary | ICD-10-CM | POA: Diagnosis not present

## 2021-04-26 DIAGNOSIS — R41841 Cognitive communication deficit: Secondary | ICD-10-CM | POA: Diagnosis not present

## 2021-04-27 NOTE — Therapy (Signed)
[place] MAIN [facility] SERVICES [address] [place], [place], [address] Phone: [phone]   Fax:  [phone]  Speech Language Pathology Treatment DISCHARGE SUMMARY  Patient Details  Name: Haley Santos MRN: [id number] Date of Birth: [birth year] Referring Provider (SLP): MD Hortencia Pilar   Encounter Date: 04/26/2021  SPEECH THERAPY DISCHARGE SUMMARY  Visits from Start of Care: 3  Current functional level related to goals / functional outcomes:  Independent with cognitive communication abilities   Remaining deficits: None identified   Education / Equipment: Referral to Neurologist  Plan: Patient agrees to discharge.         End of Session - 04/27/21 1239     Visit Number 3    Authorization - Visit Number 3    SLP Start Time 1100    SLP Stop Time  1200    SLP Time Calculation (min) 60 min    Activity Tolerance Patient tolerated treatment well             Past Medical History:  Diagnosis Date   Asthma with bronchitis    Breast cancer ([place]) 2014   CAD (coronary artery disease)    a. 08/2014: inf STEMI s/p DES to RCA.    COPD (chronic obstructive pulmonary disease) (HCC)    Crohn's disease (HCC)    Dyspnea    DOE   GERD (gastroesophageal reflux disease)    Glaucoma    Glucose intolerance (impaired glucose tolerance)    a. HgA1c 6.2 during 08/2014 admission    Hypercholesterolemia    Hypertension    Hyperthyroidism    a. s/p ablation maintained on synthroid   Hypothyroidism    Multiple sclerosis ([place])    Myocardial infarction ([place])    2016   Neuromuscular disorder ([place])    MS   Tobacco abuse     Past Surgical History:  Procedure Laterality Date   AUGMENTATION MAMMAPLASTY Left    BREAST ENHANCEMENT SURGERY  1981   [place]   CATARACT EXTRACTION W/PHACO Left 07/19/2017   Procedure: CATARACT EXTRACTION PHACO AND INTRAOCULAR LENS PLACEMENT ([place]);  Surgeon: Birder Robson, MD;  Location: [facility]  ORS;  Service: Ophthalmology;  Laterality: Left;  [place] 00:49.0 AP% 14.5 CDE 7.10 Fluid Pack Lot # [id number]   CATARACT EXTRACTION W/PHACO Right 08/09/2017   Procedure: CATARACT EXTRACTION PHACO AND INTRAOCULAR LENS PLACEMENT (IOC);  Surgeon: Birder Robson, MD;  Location: [facility] ORS;  Service: Ophthalmology;  Laterality: Right;  [place] 00:42 AP% 13.8 CDE 5.88 Fluid pack lot #[id number]   COLONOSCOPY  2010   Dr Vira Agar   CORONARY ANGIOPLASTY     STENT 2016   LEFT HEART CATHETERIZATION WITH CORONARY ANGIOGRAM N/A 09/01/2014   Procedure: LEFT HEART CATHETERIZATION WITH CORONARY ANGIOGRAM;  Surgeon: Peter M Martinique, MD;  Location: [facility] CATH LAB;  Service: Cardiovascular;  Laterality: N/A;   MASTECTOMY Right 2014   PATRICAL MASTECOMY RIGHT RAD CHEMO   PORTACATH PLACEMENT  11/07/2012   RIGHT OOPHORECTOMY  1979   TONSILLECTOMY AND ADENOIDECTOMY  1957    There were no vitals filed for this visit.   Subjective Assessment - 04/27/21 1217     Subjective "I feel that I am doing well, really recovered at this point"    Currently in Pain? No/denies                   ADULT SLP TREATMENT - 04/27/21 0001       Treatment Provided   Treatment provided Cognitive-Linquistic  Cognitive-Linquistic Treatment   Treatment focused on Cognition;Patient/family/caregiver education    Skilled Treatment Instructed pt in word finding strategies and compensatory memory strategies, handout provided, pt reports that she is using listed strategies effectively; Administered the NeuroQOL Cognitive Function Questionnaire - pt's scores indicated appropriate functioning per her report              SLP Education - 04/27/21 1238     Education Details s/s of stroke - instructed to call 911 with any symptoms, follow up with neurologist - referral sent to Dr Jennings Books    Person(s) Educated Patient    Methods Explanation;Demonstration;Handout;Verbal cues    Comprehension Verbalized understanding;Returned  demonstration              SLP Short Term Goals - 04/27/21 1302       SLP SHORT TERM GOAL #1   Title Patient will complete formal cognitive linguistic evaluation    Status Achieved              SLP Long Term Goals - 04/27/21 1302       SLP LONG TERM GOAL #1   Title Patient will demonstrate improved cognitive linguistic function for IND completion of iADLS tasks in home/community environments    Status Achieved              Plan - 04/27/21 1242     Clinical Impression Statement Pt arrived to session stating that she felt she had "gotten so much better. Do I really need more sessions?" All education has been completed, referal has been made to neurology, chat sent to Dr Delana Meyer, and pt demosntrates knowledge of s/s of possible stroke and need to call 911 for early intervention. While pt is in agreement with the above progress, she continues to express "lots of concern, what's wrong with me?" Depsite lots of questions, pt is unable to identify specifics that are acute deficits. Pt is comfortable deferring this conversation to neurology. No further skilled ST intervention is indicated.    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[place]) 09/15/2015   Sinusitis, acute frontal 06/30/2015   Left arm pain 05/08/2015   Pain of left calf 05/08/2015   Diabetes mellitus ([place]) 05/08/2015   Carotid artery disease ([place]) 09/19/2014   CAD  (coronary artery disease) 09/19/2014   GERD (gastroesophageal reflux disease)    Crohn's disease ([place])    ST elevation myocardial infarction (STEMI) of inferior wall ([place]) 09/02/2014   Tobacco abuse 09/02/2014   Carotid bruit 03/31/2014   Joint pain 10/06/2013   Sinus tachycardia 12/21/2012   Breast nodule 09/20/2012   Hypertension 06/16/2012   Hypercholesterolemia 06/16/2012   Asthma 06/16/2012   Multiple sclerosis ([place]) 06/16/2012   Psoriasis 06/16/2012   Hypothyroidism 06/16/2012   Glaucoma 06/16/2012   Cobie Marcoux B. Rutherford Nail M.S., CCC-SLP, [facility] Speech-Language Pathologist Rehabilitation Services Office [phone], [place] 04/27/2021, 1:23 PM  [place]  MAIN [facility] SERVICES [place], [place], [address] Phone: [phone]   Fax:  [phone]   Name: Haley Santos MRN: [id number] Date of Birth: 1945/11/18

## 2021-04-27 NOTE — Patient Instructions (Signed)
Continue using provided strategies for word finding/memory strategies

## 2021-04-30 ENCOUNTER — Encounter: Payer: Medicare Other | Admitting: Speech Pathology

## 2021-05-03 ENCOUNTER — Ambulatory Visit (INDEPENDENT_AMBULATORY_CARE_PROVIDER_SITE_OTHER): Payer: Medicare Other | Admitting: Vascular Surgery

## 2021-05-03 ENCOUNTER — Encounter (INDEPENDENT_AMBULATORY_CARE_PROVIDER_SITE_OTHER): Payer: Self-pay | Admitting: Vascular Surgery

## 2021-05-03 ENCOUNTER — Other Ambulatory Visit: Payer: Self-pay

## 2021-05-03 VITALS — BP 133/72 | HR 76 | Resp 16 | Wt 165.8 lb

## 2021-05-03 DIAGNOSIS — I872 Venous insufficiency (chronic) (peripheral): Secondary | ICD-10-CM | POA: Diagnosis not present

## 2021-05-03 DIAGNOSIS — I6523 Occlusion and stenosis of bilateral carotid arteries: Secondary | ICD-10-CM

## 2021-05-03 DIAGNOSIS — I10 Essential (primary) hypertension: Secondary | ICD-10-CM | POA: Diagnosis not present

## 2021-05-03 DIAGNOSIS — I25118 Atherosclerotic heart disease of native coronary artery with other forms of angina pectoris: Secondary | ICD-10-CM | POA: Diagnosis not present

## 2021-05-03 DIAGNOSIS — E7800 Pure hypercholesterolemia, unspecified: Secondary | ICD-10-CM

## 2021-05-03 NOTE — Progress Notes (Signed)
MRN : [id number]  Haley Santos is a 75 y.o. (03-30-1946) female who presents with chief complaint of follow up for carotid.  History of Present Illness:   The patient is seen for follow up evaluation of carotid stenosis.   The patient denies amaurosis fugax. There is no recent history of TIA symptoms or focal motor deficits. There is no prior documented CVA.  The patient is taking enteric-coated aspirin 81 mg daily.  There is no history of migraine headaches. There is no history of seizures.  The patient has a history of coronary artery disease, no recent episodes of angina or shortness of breath. The patient denies PAD or claudication symptoms. There is a history of hyperlipidemia which is being treated with a statin.    Carotid Duplex shows a possible high grade left common carotid lesion.     Current Meds  Medication Sig   clopidogrel (PLAVIX) 75 MG tablet Take 1 tablet (75 mg total) by mouth daily.   fluticasone-salmeterol (ADVAIR) 250-50 MCG/ACT AEPB INHALE 1 DOSE BY MOUTH TWICE DAILY   latanoprost (XALATAN) 0.005 % ophthalmic solution Place 1 drop into both eyes at bedtime.    levothyroxine (SYNTHROID) 100 MCG tablet TAKE 1 TABLET EVERY DAY   metoprolol tartrate (LOPRESSOR) 25 MG tablet Take 1 tablet (25 mg total) by mouth daily.   VENTOLIN HFA 108 (90 Base) MCG/ACT inhaler INHALE 2 PUFFS BY MOUTH EVERY 6 HOURS AS NEEDED FOR WHEEZING FOR SHORTNESS OF BREATH    Past Medical History:  Diagnosis Date   Asthma with bronchitis    Breast cancer ([place]) 2014   CAD (coronary artery disease)    a. 08/2014: inf STEMI s/p DES to RCA.    COPD (chronic obstructive pulmonary disease) (HCC)    Crohn's disease (HCC)    Dyspnea    DOE   GERD (gastroesophageal reflux disease)    Glaucoma    Glucose intolerance (impaired glucose tolerance)    a. HgA1c 6.2 during 08/2014 admission    Hypercholesterolemia    Hypertension    Hyperthyroidism    a. s/p ablation maintained on  synthroid   Hypothyroidism    Multiple sclerosis ([place])    Myocardial infarction ([place])    2016   Neuromuscular disorder ([place])    MS   Tobacco abuse     Past Surgical History:  Procedure Laterality Date   AUGMENTATION MAMMAPLASTY Left    BREAST ENHANCEMENT SURGERY  1981   [place]   CATARACT EXTRACTION W/PHACO Left 07/19/2017   Procedure: CATARACT EXTRACTION PHACO AND INTRAOCULAR LENS PLACEMENT ([place]);  Surgeon: Birder Robson, MD;  Location: [facility] ORS;  Service: Ophthalmology;  Laterality: Left;  [place] 00:49.0 AP% 14.5 CDE 7.10 Fluid Pack Lot # [id number]   CATARACT EXTRACTION W/PHACO Right 08/09/2017   Procedure: CATARACT EXTRACTION PHACO AND INTRAOCULAR LENS PLACEMENT (IOC);  Surgeon: Birder Robson, MD;  Location: [facility] ORS;  Service: Ophthalmology;  Laterality: Right;  [place] 00:42 AP% 13.8 CDE 5.88 Fluid pack lot #[id number]   COLONOSCOPY  2010   Dr Vira Agar   CORONARY ANGIOPLASTY     STENT 2016   LEFT HEART CATHETERIZATION WITH CORONARY ANGIOGRAM N/A 09/01/2014   Procedure: LEFT HEART CATHETERIZATION WITH CORONARY ANGIOGRAM;  Surgeon: Peter M Martinique, MD;  Location: [facility] CATH LAB;  Service: Cardiovascular;  Laterality: N/A;   MASTECTOMY Right 2014   PATRICAL MASTECOMY RIGHT RAD CHEMO   PORTACATH PLACEMENT  11/07/2012   RIGHT OOPHORECTOMY  [place]  Social History Social History   Tobacco Use   Smoking status: Every Day    Packs/day: 1.00    Years: 40.00    Pack years: 40.00    Types: Cigarettes    Last attempt to quit: 07/01/2015    Years since quitting: 5.8   Smokeless tobacco: Never  Vaping Use   Vaping Use: Never used  Substance Use Topics   Alcohol use: Yes    Alcohol/week: 0.0 standard drinks    Comment: RARE   Drug use: No    Family History Family History  Problem Relation Age of Onset   Diabetes Mother    Heart disease Mother        myocardial infarction   Glaucoma Mother    Berenice Primas' disease Mother    Heart  attack Mother 102   Lung disease Father    Heart disease Other        half sister   Peripheral vascular disease Other        half sister   Breast cancer Neg Hx     Allergies  Allergen Reactions   Statins Other (See Comments)    Muscle weakness/pain. Trials of multiple agents. Tolerating rosuvastatin   Augmentin [Amoxicillin-Pot Clavulanate]     Pt reported had rash after taking.  (notifed 08/13/20 appt)   Clindamycin/Lincomycin Other (See Comments)    Digestive upset   Codeine Other (See Comments)    "hallucinations"   Shellfish Allergy Nausea And Vomiting    Felt goofy headed   Valium [Diazepam] Anxiety and Other (See Comments)    hyperactivity     REVIEW OF SYSTEMS (Negative unless checked)  Constitutional: [] Weight loss  [] Fever  [] Chills Cardiac: [] Chest pain   [] Chest pressure   [] Palpitations   [] Shortness of breath when laying flat   [] Shortness of breath with exertion. Vascular:  [] Pain in legs with walking   [] Pain in legs at rest  [] History of DVT   [] Phlebitis   [] Swelling in legs   [] Varicose veins   [] Non-healing ulcers Pulmonary:   [] Uses home oxygen   [] Productive cough   [] Hemoptysis   [] Wheeze  [] COPD   [] Asthma Neurologic:  [] Dizziness   [] Seizures   [] History of stroke   [] History of TIA  [] Aphasia   [] Vissual changes   [] Weakness or numbness in arm   [] Weakness or numbness in leg Musculoskeletal:   [] Joint swelling   [] Joint pain   [] Low back pain Hematologic:  [] Easy bruising  [] Easy bleeding   [] Hypercoagulable state   [] Anemic Gastrointestinal:  [] Diarrhea   [] Vomiting  [] Gastroesophageal reflux/heartburn   [] Difficulty swallowing. Genitourinary:  [] Chronic kidney disease   [] Difficult urination  [] Frequent urination   [] Blood in urine Skin:  [] Rashes   [] Ulcers  Psychological:  [] History of anxiety   []  History of major depression.  Physical Examination  Vitals:   05/03/21 1327  BP: 133/72  Pulse: 76  Resp: 16  Weight: 165 lb 12.8 oz (75.2 kg)    Body mass index is 26.76 kg/m. Gen: WD/WN, NAD Head: [place]/AT, No temporalis wasting.  Ear/Nose/Throat: Hearing grossly intact, nares w/o erythema or drainage Eyes: PER, EOMI, sclera nonicteric.  Neck: Supple, no masses.  No bruit or JVD.  Pulmonary:  Good air movement, no audible wheezing, no use of accessory muscles.  Cardiac: RRR, normal S1, S2, no Murmurs. Vascular:   bilateral carotid bruits Vessel Right Left  Radial Palpable Palpable  Carotid Palpable Palpable  Gastrointestinal: soft, non-distended. No guarding/no peritoneal signs.  Musculoskeletal:  M/S 5/5 throughout.  No visible deformity.  Neurologic: CN 2-12 intact. Pain and light touch intact in extremities.  Symmetrical.  Speech is fluent. Motor exam as listed above. Psychiatric: Judgment intact, Mood & affect appropriate for pt's clinical situation. Dermatologic: No rashes or ulcers noted.  No changes consistent with cellulitis.   CBC Lab Results  Component Value Date   WBC 10.0 12/21/2020   HGB 14.2 12/21/2020   HCT 42.0 12/21/2020   MCV 84.5 12/21/2020   PLT 244 12/21/2020    BMET    Component Value Date/Time   NA 133 (L) 12/21/2020 1319   NA 140 12/18/2020 1330   NA 139 05/01/2014 1008   K 4.2 12/21/2020 1319   K 4.0 05/01/2014 1008   CL 98 12/21/2020 1319   CL 102 05/01/2014 1008   CO2 25 12/21/2020 1319   CO2 30 05/01/2014 1008   GLUCOSE 128 (H) 12/21/2020 1319   GLUCOSE 107 (H) 05/01/2014 1008   BUN 9 12/21/2020 1319   BUN 7 (L) 12/18/2020 1330   BUN 13 05/01/2014 1008   CREATININE 0.69 12/21/2020 1319   CREATININE 0.91 05/01/2014 1008   CALCIUM 9.3 12/21/2020 1319   CALCIUM 9.1 05/01/2014 1008   GFRNONAA >60 12/21/2020 1319   GFRNONAA >60 05/01/2014 1008   GFRNONAA 57 (L) 01/29/2014 1021   GFRAA >60 07/04/2015 1445   GFRAA >60 05/01/2014 1008   GFRAA >60 01/29/2014 1021   CrCl cannot be calculated (Patient's most recent lab result is older than the maximum 21 days allowed.).  COAG No  results found for: INR, PROTIME  Radiology VAS US CAROTID  Result Date: 04/15/2021 Carotid Arterial Duplex Study Patient Name:  Haley Santos  Date of Exam:   04/05/2021 Medical Rec #: [id number]        Accession #:    [id number] Date of Birth: 04/02/1946        Patient Gender: F Patient Age:   51 years Exam Location:  [facility] Procedure:      VAS US CAROTID Referring Phys: Hortencia Pilar --------------------------------------------------------------------------------  Indications: Carotid artery disease. Performing Technologist: Concha Norway RVT  Examination Guidelines: A complete evaluation includes B-mode imaging, spectral Doppler, color Doppler, and power Doppler as needed of all accessible portions of each vessel. Bilateral testing is considered an integral part of a complete examination. Limited examinations for reoccurring indications may be performed as noted.  Right Carotid Findings: +----------+--------+--------+--------+------------------+--------+             PSV cm/s EDV cm/s Stenosis Plaque Description Comments  +----------+--------+--------+--------+------------------+--------+  CCA Prox   89       17                                             +----------+--------+--------+--------+------------------+--------+  CCA Mid    70       16                                             +----------+--------+--------+--------+------------------+--------+  CCA Distal 75       16                                             +----------+--------+--------+--------+------------------+--------+  ICA Prox   70       15                                             +----------+--------+--------+--------+------------------+--------+  ICA Mid    81       19       1-39%    heterogenous                 +----------+--------+--------+--------+------------------+--------+  ICA Distal 62       13                                             +----------+--------+--------+--------+------------------+--------+   ECA        109      10                                             +----------+--------+--------+--------+------------------+--------+ +----------+--------+-------+----------------+-------------------+             PSV cm/s EDV cms Describe         Arm Pressure (mmHG)  +----------+--------+-------+----------------+-------------------+  Subclavian 125              Multiphasic, WNL                      +----------+--------+-------+----------------+-------------------+ +---------+--------+--+--------+-+---------+  Vertebral PSV cm/s 27 EDV cm/s 6 Antegrade  +---------+--------+--+--------+-+---------+  Left Carotid Findings: +----------+--------+--------+--------+------------------+--------+             PSV cm/s EDV cm/s Stenosis Plaque Description Comments  +----------+--------+--------+--------+------------------+--------+  CCA Prox   64       13                                             +----------+--------+--------+--------+------------------+--------+  CCA Mid    121      24       >50%     heterogenous                 +----------+--------+--------+--------+------------------+--------+  CCA Distal 115      18                                             +----------+--------+--------+--------+------------------+--------+  ICA Prox   121      42       1-39%                                 +----------+--------+--------+--------+------------------+--------+  ICA Mid    70       15                                             +----------+--------+--------+--------+------------------+--------+  ICA Distal 69  16                                             +----------+--------+--------+--------+------------------+--------+  ECA        150      20       >50%                                  +----------+--------+--------+--------+------------------+--------+ +----------+--------+--------+----------------+-------------------+             PSV cm/s EDV cm/s Describe         Arm Pressure (mmHG)   +----------+--------+--------+----------------+-------------------+  Subclavian 118               Multiphasic, WNL                      +----------+--------+--------+----------------+-------------------+ +---------+--------+--+--------+-+---------+  Vertebral PSV cm/s 40 EDV cm/s 6 Antegrade  +---------+--------+--+--------+-+---------+   Summary: Right Carotid: Velocities in the right ICA are consistent with a 1-39% stenosis.                Right proximal ICA shows heterogeneous thickening with flow seen                suggesting ulcerative plaque versus possible partially clotted                dissection of proximal ICA posterior wall. Left Carotid: Velocities in the left ICA are consistent with a 1-39% stenosis.               Hemodynamically significant plaque >50% visualized in the CCA. The               ECA appears >50% stenosed. Vertebrals:  Bilateral vertebral arteries demonstrate antegrade flow. Subclavians: Normal flow hemodynamics were seen in bilateral subclavian              arteries. *See table(s) above for measurements and observations.  Electronically signed by Hortencia Pilar MD on 04/15/2021 at 8:22:27 PM.    Final      Assessment/Plan 1. Bilateral carotid artery stenosis The patient remains asymptomatic with respect to the carotid stenosis.  However, the patient has now progressed and has a lesion the is >70%.  Patient should undergo CT angiography of the carotid arteries to define the degree of stenosis of the internal carotid arteries bilaterally and the anatomic suitability for surgery vs. intervention.  If the patient does indeed need surgery cardiac clearance will be required, once cleared the patient will be scheduled for surgery.  The risks, benefits and alternative therapies were reviewed in detail with the patient.  All questions were answered.  The patient agrees to proceed with imaging.  Continue antiplatelet therapy as prescribed. Continue management of CAD, HTN and  Hyperlipidemia. Healthy heart diet, encouraged exercise at least 4 times per week.   - CT ANGIO NECK W OR WO CONTRAST; Future - Ambulatory referral to Neurology  2. Coronary artery disease of native artery of native heart with stable angina pectoris (HCC) Continue cardiac and antihypertensive medications as already ordered and reviewed, no changes at this time.  Continue statin as ordered and reviewed, no changes at this time  Nitrates PRN for chest pain   3. Chronic venous insufficiency No surgery or intervention at this point in time.  I  have reviewed my discussion with the patient regarding venous insufficiency and why it causes symptoms. I have discussed with the patient the chronic skin changes that accompany venous insufficiency and the long term sequela such as ulceration. Patient will contnue wearing graduated compression stockings on a daily basis, as this has provided excellent control of his edema. The patient will put the stockings on first thing in the morning and removing them in the evening. The patient is reminded not to sleep in the stockings.  In addition, behavioral modification including elevation during the day will be initiated. Exercise is strongly encouraged.  Given the patient's good control and lack of any problems regarding the venous insufficiency and lymphedema a lymph pump in not need at this time.  The patient will follow up with me PRN should anything change.  The patient voices agreement with this plan.   4. Primary hypertension Continue antihypertensive medications as already ordered, these medications have been reviewed and there are no changes at this time.   5. Hypercholesterolemia Continue statin as ordered and reviewed, no changes at this time     Hortencia Pilar, MD  05/03/2021 1:28 PM

## 2021-05-04 ENCOUNTER — Encounter: Payer: Medicare Other | Admitting: Speech Pathology

## 2021-05-07 ENCOUNTER — Encounter: Payer: Medicare Other | Admitting: Speech Pathology

## 2021-05-10 ENCOUNTER — Encounter (INDEPENDENT_AMBULATORY_CARE_PROVIDER_SITE_OTHER): Payer: Self-pay | Admitting: Vascular Surgery

## 2021-05-11 ENCOUNTER — Telehealth (INDEPENDENT_AMBULATORY_CARE_PROVIDER_SITE_OTHER): Payer: Self-pay

## 2021-05-11 NOTE — Telephone Encounter (Signed)
Patient wanted to update Dr.Schnier that she got an earlier appointment with Dr.Shaw she got an appointment on 05/19/21.

## 2021-05-13 DIAGNOSIS — H401131 Primary open-angle glaucoma, bilateral, mild stage: Secondary | ICD-10-CM | POA: Diagnosis not present

## 2021-05-13 DIAGNOSIS — H26492 Other secondary cataract, left eye: Secondary | ICD-10-CM | POA: Diagnosis not present

## 2021-05-20 ENCOUNTER — Encounter (INDEPENDENT_AMBULATORY_CARE_PROVIDER_SITE_OTHER): Payer: Self-pay | Admitting: Vascular Surgery

## 2021-05-20 ENCOUNTER — Ambulatory Visit (INDEPENDENT_AMBULATORY_CARE_PROVIDER_SITE_OTHER): Payer: Medicare Other | Admitting: Vascular Surgery

## 2021-05-20 ENCOUNTER — Other Ambulatory Visit: Payer: Self-pay

## 2021-05-20 VITALS — BP 123/73 | HR 70 | Resp 17 | Ht 66.0 in | Wt 168.0 lb

## 2021-05-20 DIAGNOSIS — I6523 Occlusion and stenosis of bilateral carotid arteries: Secondary | ICD-10-CM

## 2021-05-23 ENCOUNTER — Encounter (INDEPENDENT_AMBULATORY_CARE_PROVIDER_SITE_OTHER): Payer: Self-pay | Admitting: Vascular Surgery

## 2021-05-23 NOTE — Progress Notes (Signed)
MRN : [id number]  Haley Santos is a 76 y.o. (01-Nov-1945) female who presents with chief complaint of check my carotid.  History of Present Illness:   No interval symptoms  Radiology never contacted the patient  Current Meds  Medication Sig   clopidogrel (PLAVIX) 75 MG tablet Take 1 tablet (75 mg total) by mouth daily.   fluticasone-salmeterol (ADVAIR) 250-50 MCG/ACT AEPB INHALE 1 DOSE BY MOUTH TWICE DAILY   latanoprost (XALATAN) 0.005 % ophthalmic solution Place 1 drop into both eyes at bedtime.    levothyroxine (SYNTHROID) 100 MCG tablet TAKE 1 TABLET EVERY DAY   metoprolol tartrate (LOPRESSOR) 25 MG tablet Take 1 tablet (25 mg total) by mouth daily.   VENTOLIN HFA 108 (90 Base) MCG/ACT inhaler INHALE 2 PUFFS BY MOUTH EVERY 6 HOURS AS NEEDED FOR WHEEZING FOR SHORTNESS OF BREATH    Past Medical History:  Diagnosis Date   Asthma with bronchitis    Breast cancer ([place]) 2014   CAD (coronary artery disease)    a. 08/2014: inf STEMI s/p DES to RCA.    COPD (chronic obstructive pulmonary disease) (HCC)    Crohn's disease (HCC)    Dyspnea    DOE   GERD (gastroesophageal reflux disease)    Glaucoma    Glucose intolerance (impaired glucose tolerance)    a. HgA1c 6.2 during 08/2014 admission    Hypercholesterolemia    Hypertension    Hyperthyroidism    a. s/p ablation maintained on synthroid   Hypothyroidism    Multiple sclerosis ([place])    Myocardial infarction ([place])    2016   Neuromuscular disorder ([place])    MS   Tobacco abuse     Past Surgical History:  Procedure Laterality Date   AUGMENTATION MAMMAPLASTY Left    BREAST ENHANCEMENT SURGERY  1981   [facility]   CATARACT EXTRACTION W/PHACO Left 07/19/2017   Procedure: CATARACT EXTRACTION PHACO AND INTRAOCULAR LENS PLACEMENT ([place]);  Surgeon: Birder Robson, MD;  Location: [facility] ORS;  Service: Ophthalmology;  Laterality: Left;  [place] 00:49.0 AP% 14.5 CDE 7.10 Fluid Pack Lot # [id number]   CATARACT EXTRACTION W/PHACO  Right 08/09/2017   Procedure: CATARACT EXTRACTION PHACO AND INTRAOCULAR LENS PLACEMENT ([facility]);  Surgeon: Birder Robson, MD;  Location: [facility] ORS;  Service: Ophthalmology;  Laterality: Right;  [place] 00:42 AP% 13.8 CDE 5.88 Fluid pack lot #[id number]   COLONOSCOPY  2010   Dr Vira Agar   CORONARY ANGIOPLASTY     STENT 2016   LEFT HEART CATHETERIZATION WITH CORONARY ANGIOGRAM N/A 09/01/2014   Procedure: LEFT HEART CATHETERIZATION WITH CORONARY ANGIOGRAM;  Surgeon: Peter M Martinique, MD;  Location: [facility] CATH LAB;  Service: Cardiovascular;  Laterality: N/A;   MASTECTOMY Right 2014   PATRICAL MASTECOMY RIGHT RAD CHEMO   PORTACATH PLACEMENT  11/07/2012   RIGHT OOPHORECTOMY  1979   TONSILLECTOMY AND ADENOIDECTOMY  1957    Social History Social History   Tobacco Use   Smoking status: Every Day    Packs/day: 1.00    Years: 40.00    Pack years: 40.00    Types: Cigarettes    Last attempt to quit: 07/01/2015    Years since quitting: 5.8   Smokeless tobacco: Never  Vaping Use   Vaping Use: Never used  Substance Use Topics   Alcohol use: Yes    Alcohol/week: 0.0 standard drinks    Comment: RARE   Drug use: No    Family History Family History  Problem Relation Age of Onset  Diabetes Mother    Heart disease Mother        myocardial infarction   Glaucoma Mother    Berenice Primas' disease Mother    Heart attack Mother 13   Lung disease Father    Heart disease Other        half sister   Peripheral vascular disease Other        half sister   Breast cancer Neg Hx     Allergies  Allergen Reactions   Statins Other (See Comments)    Muscle weakness/pain. Trials of multiple agents. Tolerating rosuvastatin   Augmentin [Amoxicillin-Pot Clavulanate]     Pt reported had rash after taking.  (notifed 08/13/20 appt)   Clindamycin/Lincomycin Other (See Comments)    Digestive upset   Codeine Other (See Comments)    "hallucinations"   Shellfish Allergy Nausea And Vomiting    Felt goofy headed   Valium  [Diazepam] Anxiety and Other (See Comments)    hyperactivity     REVIEW OF SYSTEMS (Negative unless checked)  Constitutional: [] Weight loss  [] Fever  [] Chills Cardiac: [] Chest pain   [] Chest pressure   [] Palpitations   [] Shortness of breath when laying flat   [] Shortness of breath with exertion. Vascular:  [] Pain in legs with walking   [] Pain in legs at rest  [] History of DVT   [] Phlebitis   [] Swelling in legs   [] Varicose veins   [] Non-healing ulcers Pulmonary:   [] Uses home oxygen   [] Productive cough   [] Hemoptysis   [] Wheeze  [] COPD   [] Asthma Neurologic:  [] Dizziness   [] Seizures   [] History of stroke   [] History of TIA  [] Aphasia   [] Vissual changes   [] Weakness or numbness in arm   [] Weakness or numbness in leg Musculoskeletal:   [] Joint swelling   [] Joint pain   [] Low back pain Hematologic:  [] Easy bruising  [] Easy bleeding   [] Hypercoagulable state   [] Anemic Gastrointestinal:  [] Diarrhea   [] Vomiting  [] Gastroesophageal reflux/heartburn   [] Difficulty swallowing. Genitourinary:  [] Chronic kidney disease   [] Difficult urination  [] Frequent urination   [] Blood in urine Skin:  [] Rashes   [] Ulcers  Psychological:  [] History of anxiety   []  History of major depression.  Physical Examination  Vitals:   05/20/21 1326  BP: 123/73  Pulse: 70  Resp: 17  Weight: 168 lb (76.2 kg)  Height: 5' 6"  (1.676 m)   Body mass index is 27.12 kg/m. Gen: WD/WN, NAD Head: [place]/AT, No temporalis wasting.  Ear/Nose/Throat: Hearing grossly intact, nares w/o erythema or drainage Eyes: PER, EOMI, sclera nonicteric.  Neck: Supple, no masses.  No bruit or JVD.  Pulmonary:  Good air movement, no audible wheezing, no use of accessory muscles.  Cardiac: RRR, normal S1, S2, no Murmurs. Vascular:   Vessel Right Left  Radial Palpable Palpable  Gastrointestinal: soft, non-distended. No guarding/no peritoneal signs.  Musculoskeletal: M/S 5/5 throughout.  No visible deformity.  Neurologic: CN 2-12 intact.  Pain and light touch intact in extremities.  Symmetrical.  Speech is fluent. Motor exam as listed above. Psychiatric: Judgment intact, Mood & affect appropriate for pt's clinical situation. Dermatologic: No rashes or ulcers noted.  No changes consistent with cellulitis.   CBC Lab Results  Component Value Date   WBC 10.0 12/21/2020   HGB 14.2 12/21/2020   HCT 42.0 12/21/2020   MCV 84.5 12/21/2020   PLT 244 12/21/2020    BMET    Component Value Date/Time   NA 133 (L) 12/21/2020 1319   NA 140  12/18/2020 1330   NA 139 05/01/2014 1008   K 4.2 12/21/2020 1319   K 4.0 05/01/2014 1008   CL 98 12/21/2020 1319   CL 102 05/01/2014 1008   CO2 25 12/21/2020 1319   CO2 30 05/01/2014 1008   GLUCOSE 128 (H) 12/21/2020 1319   GLUCOSE 107 (H) 05/01/2014 1008   BUN 9 12/21/2020 1319   BUN 7 (L) 12/18/2020 1330   BUN 13 05/01/2014 1008   CREATININE 0.69 12/21/2020 1319   CREATININE 0.91 05/01/2014 1008   CALCIUM 9.3 12/21/2020 1319   CALCIUM 9.1 05/01/2014 1008   GFRNONAA >60 12/21/2020 1319   GFRNONAA >60 05/01/2014 1008   GFRNONAA 57 (L) 01/29/2014 1021   GFRAA >60 07/04/2015 1445   GFRAA >60 05/01/2014 1008   GFRAA >60 01/29/2014 1021   CrCl cannot be calculated (Patient's most recent lab result is older than the maximum 21 days allowed.).  COAG No results found for: INR, PROTIME  Radiology No results found.   Assessment/Plan 1. Bilateral carotid artery stenosis We have contacted radiology and she is now scheduled for a CT scan.  She will follow up after the study    Hortencia Pilar, MD  05/23/2021 12:20 PM

## 2021-05-27 ENCOUNTER — Ambulatory Visit
Admission: RE | Admit: 2021-05-27 | Discharge: 2021-05-27 | Disposition: A | Discharge status: Home / Self Care | Payer: Medicare Other | Source: Ambulatory Visit | Attending: Vascular Surgery | Admitting: Vascular Surgery

## 2021-05-27 ENCOUNTER — Other Ambulatory Visit: Payer: Self-pay

## 2021-05-27 DIAGNOSIS — I771 Stricture of artery: Secondary | ICD-10-CM | POA: Diagnosis not present

## 2021-05-27 DIAGNOSIS — I6523 Occlusion and stenosis of bilateral carotid arteries: Secondary | ICD-10-CM | POA: Diagnosis not present

## 2021-05-27 DIAGNOSIS — I6503 Occlusion and stenosis of bilateral vertebral arteries: Secondary | ICD-10-CM | POA: Diagnosis not present

## 2021-05-27 DIAGNOSIS — I672 Cerebral atherosclerosis: Secondary | ICD-10-CM | POA: Diagnosis not present

## 2021-05-27 LAB — POCT I-STAT CREATININE: Creatinine, Ser: 0.9 mg/dL (ref 0.44–1.00)

## 2021-05-27 MED ORDER — IOHEXOL 350 MG/ML SOLN
75.0000 mL | Freq: Once | INTRAVENOUS | Status: AC | PRN
  Administered 2021-05-27: 75 mL via INTRAVENOUS

## 2021-05-30 NOTE — Progress Notes (Signed)
MRN : [id number]  SECRET Haley Santos is a 76 y.o. (March 21, 1946) female who presents with chief complaint of follow up CT.  History of Present Illness:   The patient is seen for follow up evaluation of carotid stenosis status post CT angiogram. CT scan was done 05/27/2020. Patient reports that the test went well with no problems or complications.   The patient denies interval amaurosis fugax. There is no recent or interval TIA symptoms or focal motor deficits. There is no prior documented CVA.  The patient is taking enteric-coated aspirin 81 mg daily.  There is no history of migraine headaches. There is no history of seizures.  The patient has a history of coronary artery disease, no recent episodes of angina or shortness of breath. The patient denies PAD or claudication symptoms. There is a history of hyperlipidemia which is being treated with a statin.    CT angiogram is reviewed by me personally and shows 50% stenosis consistent with calcified plaque at the origin of the left internal carotid artery.  There is also a 60% stenosis of the origin of the left common carotid.  Of note the duplex ultrasound dated 04/05/2021 shows the velocities of the common carotid arteries are similar bilaterally suggesting the origin lesion of the left common carotid is not yet significant  No outpatient medications have been marked as taking for the 05/31/21 encounter (Appointment) with Delana Meyer, Dolores Lory, MD.    Past Medical History:  Diagnosis Date   Asthma with bronchitis    Breast cancer ([place]) 2014   CAD (coronary artery disease)    a. 08/2014: inf STEMI s/p DES to RCA.    COPD (chronic obstructive pulmonary disease) (HCC)    Crohn's disease (HCC)    Dyspnea    DOE   GERD (gastroesophageal reflux disease)    Glaucoma    Glucose intolerance (impaired glucose tolerance)    a. HgA1c 6.2 during 08/2014 admission    Hypercholesterolemia    Hypertension    Hyperthyroidism    a. s/p ablation  maintained on synthroid   Hypothyroidism    Multiple sclerosis ([place])    Myocardial infarction ([place])    2016   Neuromuscular disorder ([place])    MS   Tobacco abuse     Past Surgical History:  Procedure Laterality Date   AUGMENTATION MAMMAPLASTY Left    BREAST ENHANCEMENT SURGERY  1981   [place]   CATARACT EXTRACTION W/PHACO Left 07/19/2017   Procedure: CATARACT EXTRACTION PHACO AND INTRAOCULAR LENS PLACEMENT ([place]);  Surgeon: Birder Robson, MD;  Location: [facility] ORS;  Service: Ophthalmology;  Laterality: Left;  [place] 00:49.0 AP% 14.5 CDE 7.10 Fluid Pack Lot # [id number]   CATARACT EXTRACTION W/PHACO Right 08/09/2017   Procedure: CATARACT EXTRACTION PHACO AND INTRAOCULAR LENS PLACEMENT ([facility]);  Surgeon: Birder Robson, MD;  Location: [facility] ORS;  Service: Ophthalmology;  Laterality: Right;  [place] 00:42 AP% 13.8 CDE 5.88 Fluid pack lot #[id number]   COLONOSCOPY  2010   Dr Vira Agar   CORONARY ANGIOPLASTY     STENT 2016   LEFT HEART CATHETERIZATION WITH CORONARY ANGIOGRAM N/A 09/01/2014   Procedure: LEFT HEART CATHETERIZATION WITH CORONARY ANGIOGRAM;  Surgeon: Peter M Martinique, MD;  Location: [facility] CATH LAB;  Service: Cardiovascular;  Laterality: N/A;   MASTECTOMY Right 2014   PATRICAL MASTECOMY RIGHT RAD CHEMO   PORTACATH PLACEMENT  11/07/2012   RIGHT OOPHORECTOMY  1979   TONSILLECTOMY AND ADENOIDECTOMY  1957    Social History Social History   Tobacco  Use   Smoking status: Every Day    Packs/day: 1.00    Years: 40.00    Pack years: 40.00    Types: Cigarettes    Last attempt to quit: 07/01/2015    Years since quitting: 5.9   Smokeless tobacco: Never  Vaping Use   Vaping Use: Never used  Substance Use Topics   Alcohol use: Yes    Alcohol/week: 0.0 standard drinks    Comment: RARE   Drug use: No    Family History Family History  Problem Relation Age of Onset   Diabetes Mother    Heart disease Mother        myocardial infarction   Glaucoma Mother    Berenice Primas' disease Mother     Heart attack Mother 77   Lung disease Father    Heart disease Other        half sister   Peripheral vascular disease Other        half sister   Breast cancer Neg Hx     Allergies  Allergen Reactions   Statins Other (See Comments)    Muscle weakness/pain. Trials of multiple agents. Tolerating rosuvastatin   Augmentin [Amoxicillin-Pot Clavulanate]     Pt reported had rash after taking.  (notifed 08/13/20 appt)   Clindamycin/Lincomycin Other (See Comments)    Digestive upset   Codeine Other (See Comments)    "hallucinations"   Shellfish Allergy Nausea And Vomiting    Felt goofy headed   Valium [Diazepam] Anxiety and Other (See Comments)    hyperactivity     REVIEW OF SYSTEMS (Negative unless checked)  Constitutional: [] Weight loss  [] Fever  [] Chills Cardiac: [] Chest pain   [] Chest pressure   [] Palpitations   [] Shortness of breath when laying flat   [] Shortness of breath with exertion. Vascular:  [] Pain in legs with walking   [] Pain in legs at rest  [] History of DVT   [] Phlebitis   [] Swelling in legs   [] Varicose veins   [] Non-healing ulcers Pulmonary:   [] Uses home oxygen   [] Productive cough   [] Hemoptysis   [] Wheeze  [] COPD   [] Asthma Neurologic:  [] Dizziness   [] Seizures   [] History of stroke   [] History of TIA  [] Aphasia   [] Vissual changes   [] Weakness or numbness in arm   [] Weakness or numbness in leg Musculoskeletal:   [] Joint swelling   [] Joint pain   [] Low back pain Hematologic:  [] Easy bruising  [] Easy bleeding   [] Hypercoagulable state   [] Anemic Gastrointestinal:  [] Diarrhea   [] Vomiting  [] Gastroesophageal reflux/heartburn   [] Difficulty swallowing. Genitourinary:  [] Chronic kidney disease   [] Difficult urination  [] Frequent urination   [] Blood in urine Skin:  [] Rashes   [] Ulcers  Psychological:  [] History of anxiety   []  History of major depression.  Physical Examination  There were no vitals filed for this visit. There is no height or weight on file to  calculate BMI. Gen: WD/WN, NAD Head: [place]/AT, No temporalis wasting.  Ear/Nose/Throat: Hearing grossly intact, nares w/o erythema or drainage Eyes: PER, EOMI, sclera nonicteric.  Neck: Supple, no masses.  No bruit or JVD.  Pulmonary:  Good air movement, no audible wheezing, no use of accessory muscles.  Cardiac: RRR, normal S1, S2, no Murmurs. Vascular:   bilateral carotid bruits Vessel Right Left  Radial Palpable Palpable  Carotid Palpable Palpable  Gastrointestinal: soft, non-distended. No guarding/no peritoneal signs.  Musculoskeletal: M/S 5/5 throughout.  No visible deformity.  Neurologic: CN 2-12 intact. Pain and light touch intact in  extremities.  Symmetrical.  Speech is fluent. Motor exam as listed above. Psychiatric: Judgment intact, Mood & affect appropriate for pt's clinical situation. Dermatologic: No rashes or ulcers noted.  No changes consistent with cellulitis.   CBC Lab Results  Component Value Date   WBC 10.0 12/21/2020   HGB 14.2 12/21/2020   HCT 42.0 12/21/2020   MCV 84.5 12/21/2020   PLT 244 12/21/2020    BMET    Component Value Date/Time   NA 133 (L) 12/21/2020 1319   NA 140 12/18/2020 1330   NA 139 05/01/2014 1008   K 4.2 12/21/2020 1319   K 4.0 05/01/2014 1008   CL 98 12/21/2020 1319   CL 102 05/01/2014 1008   CO2 25 12/21/2020 1319   CO2 30 05/01/2014 1008   GLUCOSE 128 (H) 12/21/2020 1319   GLUCOSE 107 (H) 05/01/2014 1008   BUN 9 12/21/2020 1319   BUN 7 (L) 12/18/2020 1330   BUN 13 05/01/2014 1008   CREATININE 0.90 05/27/2021 0952   CREATININE 0.91 05/01/2014 1008   CALCIUM 9.3 12/21/2020 1319   CALCIUM 9.1 05/01/2014 1008   GFRNONAA >60 12/21/2020 1319   GFRNONAA >60 05/01/2014 1008   GFRNONAA 57 (L) 01/29/2014 1021   GFRAA >60 07/04/2015 1445   GFRAA >60 05/01/2014 1008   GFRAA >60 01/29/2014 1021   Estimated Creatinine Clearance: 58.1 mL/min (by C-G formula based on SCr of 0.9 mg/dL).  COAG No results found for: INR,  PROTIME  Radiology CT ANGIO NECK W OR WO CONTRAST  Result Date: 05/27/2021 CLINICAL DATA:  Carotid artery stenosis. EXAM: CT ANGIOGRAPHY NECK TECHNIQUE: Multidetector CT imaging of the neck was performed using the standard protocol during bolus administration of intravenous contrast. Multiplanar CT image reconstructions and MIPs were obtained to evaluate the vascular anatomy. Carotid stenosis measurements (when applicable) are obtained utilizing NASCET criteria, using the distal internal carotid diameter as the denominator. RADIATION DOSE REDUCTION: This exam was performed according to the departmental dose-optimization program which includes automated exposure control, adjustment of the mA and/or kV according to patient size and/or use of iterative reconstruction technique. CONTRAST:  25m OMNIPAQUE IOHEXOL 350 MG/ML SOLN COMPARISON:  01/21/2011 FINDINGS: Aortic arch: Aortic atherosclerosis. No aneurysm or dissection at the arch. Branching pattern is normal. No innominate origin stenosis. Focal stenosis at the left common carotid artery origin with diameter 2.3 mm. The expected diameter in this location is 6-7 mm, indicating a 60-70% stenosis. There is atherosclerotic disease at the left subclavian artery origin with minimal diameter of 3.7 mm. Expected diameter is 7 mm, indicating approximately 50% stenosis. Right carotid system: Common carotid artery shows scattered plaque but is widely patent to the bifurcation. There is soft plaque at the carotid bifurcation and ICA bulb. Minimal diameter in the ICA bulb measures 2.8 mm. Compared to a more distal cervical ICA diameter of 4 mm, this indicates a 30% stenosis. Left carotid system: The on the origin stenosis, the vessel shows scattered plaque with some irregularity and ulceration. Soft and calcified plaque at the carotid bifurcation and ICA bulb with minimal diameter in the bulb measuring 2.1 mm. Compared to a more distal cervical ICA diameter of 4 mm, this  indicates a 50% stenosis. Vertebral arteries: No subclavian stenosis on the right. Stenosis of the dominant right vertebral artery origin of 50-70%. Beyond that, the vessel is widely patent through the cervical region to the foramen magnum. As noted above, there is 50% stenosis of the proximal left subclavian artery. There is mild stenosis  at the non dominant left vertebral artery origin. Beyond that, the small-vessel is patent through the cervical region to the foramen magnum. Skeleton: Mild cervical spondylosis. Extensive dental and periodontal disease. Other neck: No mass or lymphadenopathy. Upper chest: Mild emphysematous changes in the upper lobes. No focal or active process. Both internal carotid arteries are patent through the skull base and siphon regions with ordinary siphon atherosclerotic calcification. Both vertebral arteries are patent to the basilar. There is basilar artery atherosclerotic irregularity without flow limiting stenosis. IMPRESSION: Aortic Atherosclerosis (ICD10-I70.0) and Emphysema (ICD10-J43.9). Atherosclerotic disease affecting the right carotid system. Maximal stenosis in the ICA bulb is 30%. Atherosclerotic disease affecting the left carotid system, including irregular and possibly ulcerated plaque in the midportion of the common carotid artery. 60-70% stenosis at the left common carotid artery origin from the arch. Extensive plaque at the carotid bifurcation and ICA bulb with 50% stenosis in the ICA bulb. 50-70% stenosis at the origin of the dominant right vertebral artery. 50% stenosis of the proximal left subclavian artery with mild stenosis at the origin of the non dominant small left vertebral artery. Electronically Signed   By: Nelson Chimes M.D.   On: 05/27/2021 12:44     Assessment/Plan 1. Bilateral carotid artery stenosis Recommend:  Given the patient's asymptomatic subcritical stenosis no further invasive testing or surgery at this time.  CT angiogram is reviewed by  me personally and shows 50% stenosis consistent with calcified plaque at the origin of the left internal carotid artery.  There is also a 60% stenosis of the origin of the left common carotid.  Of note the duplex ultrasound dated 04/05/2021 shows the velocities of the common carotid arteries are similar bilaterally suggesting the origin lesion of the left common carotid is not yet significant  Continue antiplatelet therapy as prescribed Continue management of CAD, HTN and Hyperlipidemia Healthy heart diet,  encouraged exercise at least 4 times per week Follow up in 6 months with duplex ultrasound and physical exam.    - VAS US CAROTID; Future  2. Chronic venous insufficiency No surgery or intervention at this point in time.  I have reviewed my discussion with the patient regarding venous insufficiency and why it causes symptoms. I have discussed with the patient the chronic skin changes that accompany venous insufficiency and the long term sequela such as ulceration. Patient will contnue wearing graduated compression stockings on a daily basis, as this has provided excellent control of his edema. The patient will put the stockings on first thing in the morning and removing them in the evening. The patient is reminded not to sleep in the stockings.  In addition, behavioral modification including elevation during the day will be initiated. Exercise is strongly encouraged.  Given the patient's good control and lack of any problems regarding the venous insufficiency and lymphedema a lymph pump in not need at this time.    3. Coronary artery disease of native artery of native heart with stable angina pectoris (HCC) Continue cardiac and antihypertensive medications as already ordered and reviewed, no changes at this time.  Continue statin as ordered and reviewed, no changes at this time  Nitrates PRN for chest pain   4. Primary hypertension Continue antihypertensive medications as already  ordered, these medications have been reviewed and there are no changes at this time.   5. Type 2 diabetes mellitus with hyperglycemia, without long-term current use of insulin (HCC) Continue hypoglycemic medications as already ordered, these medications have been reviewed and there are no  changes at this time.  Hgb A1C to be monitored as already arranged by primary service    Hortencia Pilar, MD  05/30/2021 12:57 PM

## 2021-05-31 ENCOUNTER — Ambulatory Visit (INDEPENDENT_AMBULATORY_CARE_PROVIDER_SITE_OTHER): Payer: Medicare Other | Admitting: Vascular Surgery

## 2021-05-31 ENCOUNTER — Encounter (INDEPENDENT_AMBULATORY_CARE_PROVIDER_SITE_OTHER): Payer: Self-pay | Admitting: Vascular Surgery

## 2021-05-31 ENCOUNTER — Other Ambulatory Visit: Payer: Self-pay

## 2021-05-31 VITALS — BP 142/93 | HR 62 | Resp 16 | Ht 66.0 in | Wt 167.0 lb

## 2021-05-31 DIAGNOSIS — I6523 Occlusion and stenosis of bilateral carotid arteries: Secondary | ICD-10-CM

## 2021-05-31 DIAGNOSIS — I10 Essential (primary) hypertension: Secondary | ICD-10-CM | POA: Diagnosis not present

## 2021-05-31 DIAGNOSIS — I25118 Atherosclerotic heart disease of native coronary artery with other forms of angina pectoris: Secondary | ICD-10-CM

## 2021-05-31 DIAGNOSIS — E1165 Type 2 diabetes mellitus with hyperglycemia: Secondary | ICD-10-CM

## 2021-05-31 DIAGNOSIS — I872 Venous insufficiency (chronic) (peripheral): Secondary | ICD-10-CM | POA: Diagnosis not present

## 2021-06-06 ENCOUNTER — Encounter (INDEPENDENT_AMBULATORY_CARE_PROVIDER_SITE_OTHER): Payer: Self-pay | Admitting: Vascular Surgery

## 2021-06-08 DIAGNOSIS — R4189 Other symptoms and signs involving cognitive functions and awareness: Secondary | ICD-10-CM | POA: Diagnosis not present

## 2021-06-08 DIAGNOSIS — I6523 Occlusion and stenosis of bilateral carotid arteries: Secondary | ICD-10-CM | POA: Diagnosis not present

## 2021-06-08 DIAGNOSIS — R4789 Other speech disturbances: Secondary | ICD-10-CM | POA: Diagnosis not present

## 2021-06-08 DIAGNOSIS — K5090 Crohn's disease, unspecified, without complications: Secondary | ICD-10-CM | POA: Diagnosis not present

## 2021-06-08 DIAGNOSIS — G35 Multiple sclerosis: Secondary | ICD-10-CM | POA: Diagnosis not present

## 2021-06-09 ENCOUNTER — Other Ambulatory Visit: Payer: Self-pay | Admitting: Neurology

## 2021-06-09 DIAGNOSIS — G35 Multiple sclerosis: Secondary | ICD-10-CM

## 2021-06-17 ENCOUNTER — Other Ambulatory Visit: Payer: Self-pay

## 2021-06-17 ENCOUNTER — Ambulatory Visit (INDEPENDENT_AMBULATORY_CARE_PROVIDER_SITE_OTHER): Payer: Medicare Other | Admitting: Internal Medicine

## 2021-06-17 VITALS — BP 130/72 | HR 73 | Temp 97.8°F | Resp 16 | Ht 66.0 in | Wt 165.6 lb

## 2021-06-17 DIAGNOSIS — I10 Essential (primary) hypertension: Secondary | ICD-10-CM

## 2021-06-17 DIAGNOSIS — I6523 Occlusion and stenosis of bilateral carotid arteries: Secondary | ICD-10-CM | POA: Diagnosis not present

## 2021-06-17 DIAGNOSIS — E7800 Pure hypercholesterolemia, unspecified: Secondary | ICD-10-CM

## 2021-06-17 DIAGNOSIS — E1165 Type 2 diabetes mellitus with hyperglycemia: Secondary | ICD-10-CM | POA: Diagnosis not present

## 2021-06-17 DIAGNOSIS — G35 Multiple sclerosis: Secondary | ICD-10-CM | POA: Diagnosis not present

## 2021-06-17 DIAGNOSIS — K50918 Crohn's disease, unspecified, with other complication: Secondary | ICD-10-CM | POA: Diagnosis not present

## 2021-06-17 DIAGNOSIS — D509 Iron deficiency anemia, unspecified: Secondary | ICD-10-CM | POA: Diagnosis not present

## 2021-06-17 DIAGNOSIS — J4520 Mild intermittent asthma, uncomplicated: Secondary | ICD-10-CM | POA: Diagnosis not present

## 2021-06-17 DIAGNOSIS — R413 Other amnesia: Secondary | ICD-10-CM | POA: Diagnosis not present

## 2021-06-17 DIAGNOSIS — K8689 Other specified diseases of pancreas: Secondary | ICD-10-CM

## 2021-06-17 DIAGNOSIS — I25118 Atherosclerotic heart disease of native coronary artery with other forms of angina pectoris: Secondary | ICD-10-CM | POA: Diagnosis not present

## 2021-06-17 DIAGNOSIS — E038 Other specified hypothyroidism: Secondary | ICD-10-CM

## 2021-06-17 DIAGNOSIS — Z720 Tobacco use: Secondary | ICD-10-CM

## 2021-06-17 NOTE — Progress Notes (Signed)
Patient ID: Haley Santos, female   DOB: 20-Jan-1946, 76 y.o.   MRN: [id number]   Subjective:    Patient ID: Haley Santos, female    DOB: [birth year], 76 y.o.   MRN: [id number]  This visit occurred during the SARS-CoV-2 public health emergency.  Safety protocols were in place, including screening questions prior to the visit, additional usage of staff PPE, and extensive cleaning of exam room while observing appropriate contact time as indicated for disinfecting solutions.   Patient here for a scheduled follow up.   Chief Complaint  Patient presents with   Hypothyroidism   Hypertension   .   HPI Recently evaluated by vascular surgery and neurology for possible stroke.  Reports she was reading and had difficulty comprehending what was on the page, slurred speech and difficulty with word finding - occurred in 03/2021.  Neurology ordered MRI (scheduled for 06/29/21).  Declines statin medication - intolerance.  Saw vascular - CT angiogram - 50% stenosis consistent with calcified plaque at the origin of the left internal carotid artery.  There is also a 60% stenosis of the origin of the left common carotid. F/u carotid ultrasound - 03/2021 - similar.  No headache currently.  No dizziness.  Answering questions appropriately.  No chest pain.  Breathing stable.  No acid reflux or abdominal pain reported.  Eating.  Continues to smoke.  No recurring episodes.     Past Medical History:  Diagnosis Date   Asthma with bronchitis    Breast cancer ([place]) 2014   CAD (coronary artery disease)    a. 08/2014: inf STEMI s/p DES to RCA.    COPD (chronic obstructive pulmonary disease) (HCC)    Crohn's disease (HCC)    Dyspnea    DOE   GERD (gastroesophageal reflux disease)    Glaucoma    Glucose intolerance (impaired glucose tolerance)    a. HgA1c 6.2 during 08/2014 admission    Hypercholesterolemia    Hypertension    Hyperthyroidism    a. s/p ablation maintained on synthroid   Hypothyroidism    Multiple  sclerosis ([place])    Myocardial infarction ([place])    2016   Neuromuscular disorder ([place])    MS   Tobacco abuse    Past Surgical History:  Procedure Laterality Date   AUGMENTATION MAMMAPLASTY Left    BREAST ENHANCEMENT SURGERY  1981   [facility]   CATARACT EXTRACTION W/PHACO Left 07/19/2017   Procedure: CATARACT EXTRACTION PHACO AND INTRAOCULAR LENS PLACEMENT ([place]);  Surgeon: Birder Robson, MD;  Location: [facility] ORS;  Service: Ophthalmology;  Laterality: Left;  [place] 00:49.0 AP% 14.5 CDE 7.10 Fluid Pack Lot # [id number]   CATARACT EXTRACTION W/PHACO Right 08/09/2017   Procedure: CATARACT EXTRACTION PHACO AND INTRAOCULAR LENS PLACEMENT ([facility]);  Surgeon: Birder Robson, MD;  Location: [facility] ORS;  Service: Ophthalmology;  Laterality: Right;  [place] 00:42 AP% 13.8 CDE 5.88 Fluid pack lot #[id number]   COLONOSCOPY  2010   Dr Vira Agar   CORONARY ANGIOPLASTY     STENT 2016   LEFT HEART CATHETERIZATION WITH CORONARY ANGIOGRAM N/A 09/01/2014   Procedure: LEFT HEART CATHETERIZATION WITH CORONARY ANGIOGRAM;  Surgeon: Peter M Martinique, MD;  Location: [facility] CATH LAB;  Service: Cardiovascular;  Laterality: N/A;   MASTECTOMY Right 2014   PATRICAL MASTECOMY RIGHT RAD CHEMO   PORTACATH PLACEMENT  11/07/2012   RIGHT OOPHORECTOMY  1979   TONSILLECTOMY AND ADENOIDECTOMY  1957   Family History  Problem Relation Age of Onset   Diabetes Mother  Heart disease Mother        myocardial infarction   Glaucoma Mother    Berenice Primas' disease Mother    Heart attack Mother 32   Lung disease Father    Heart disease Other        half sister   Peripheral vascular disease Other        half sister   Breast cancer Neg Hx    Social History   Socioeconomic History   Marital status: Divorced    Spouse name: Not on file   Number of children: 1   Years of education: Not on file   Highest education level: Not on file  Occupational History   Not on file  Tobacco Use   Smoking status: Every Day    Packs/day: 1.00     Years: 40.00    Pack years: 40.00    Types: Cigarettes    Last attempt to quit: 07/01/2015    Years since quitting: 5.9   Smokeless tobacco: Never  Vaping Use   Vaping Use: Never used  Substance and Sexual Activity   Alcohol use: Yes    Alcohol/week: 0.0 standard drinks    Comment: RARE   Drug use: No   Sexual activity: Not on file  Other Topics Concern   Not on file  Social History Narrative   Not on file   Social Determinants of Health   Financial Resource Strain: Not on file  Food Insecurity: Not on file  Transportation Needs: Not on file  Physical Activity: Not on file  Stress: Not on file  Social Connections: Not on file     Review of Systems  Constitutional:  Negative for appetite change and unexpected weight change.  HENT:  Negative for congestion and sinus pressure.   Respiratory:  Negative for cough, chest tightness and shortness of breath.   Cardiovascular:  Negative for chest pain and palpitations.  Gastrointestinal:  Negative for abdominal pain, diarrhea, nausea and vomiting.  Genitourinary:  Negative for difficulty urinating and dysuria.  Musculoskeletal:  Negative for joint swelling and myalgias.  Skin:  Negative for color change and rash.  Neurological:  Negative for dizziness, light-headedness and headaches.  Psychiatric/Behavioral:  Negative for agitation and dysphoric mood.       Objective:     BP 130/72    Pulse 73    Temp 97.8 F (36.6 C)    Resp 16    Ht _0  (1.676 m)    Wt 165 lb 9.6 oz (75.1 kg)    LMP 06/15/1977    SpO2 99%    BMI 26.73 kg/m  Wt Readings from Last 3 Encounters:  06/17/21 165 lb 9.6 oz (75.1 kg)  05/31/21 167 lb (75.8 kg)  05/20/21 168 lb (76.2 kg)    Physical Exam Vitals reviewed.  Constitutional:      General: She is not in acute distress.    Appearance: Normal appearance.  HENT:     Head: Normocephalic and atraumatic.     Right Ear: External ear normal.     Left Ear: External ear normal.  Eyes:     General:  No scleral icterus.       Right eye: No discharge.        Left eye: No discharge.     Conjunctiva/sclera: Conjunctivae normal.  Neck:     Thyroid: No thyromegaly.  Cardiovascular:     Rate and Rhythm: Normal rate and regular rhythm.  Pulmonary:     Effort: No  respiratory distress.     Breath sounds: Normal breath sounds. No wheezing.  Abdominal:     General: Bowel sounds are normal.     Palpations: Abdomen is soft.     Tenderness: There is no abdominal tenderness.  Musculoskeletal:        General: No swelling or tenderness.     Cervical back: Neck supple. No tenderness.  Lymphadenopathy:     Cervical: No cervical adenopathy.  Skin:    Findings: No erythema or rash.  Neurological:     Mental Status: She is alert.  Psychiatric:        Mood and Affect: Mood normal.        Behavior: Behavior normal.     Outpatient Encounter Medications as of 06/17/2021  Medication Sig   aspirin EC 81 MG tablet Take 81 mg by mouth daily. Swallow whole.   clopidogrel (PLAVIX) 75 MG tablet Take 1 tablet (75 mg total) by mouth daily.   fluticasone-salmeterol (ADVAIR) 250-50 MCG/ACT AEPB INHALE 1 DOSE BY MOUTH TWICE DAILY   latanoprost (XALATAN) 0.005 % ophthalmic solution Place 1 drop into both eyes at bedtime.    levothyroxine (SYNTHROID) 100 MCG tablet TAKE 1 TABLET EVERY DAY   metoprolol tartrate (LOPRESSOR) 25 MG tablet Take 1 tablet (25 mg total) by mouth daily.   VENTOLIN HFA 108 (90 Base) MCG/ACT inhaler INHALE 2 PUFFS BY MOUTH EVERY 6 HOURS AS NEEDED FOR WHEEZING FOR SHORTNESS OF BREATH   No facility-administered encounter medications on file as of 06/17/2021.     Lab Results  Component Value Date   WBC 8.2 06/17/2021   HGB 14.8 06/17/2021   HCT 43.5 06/17/2021   PLT 230.0 06/17/2021   GLUCOSE 102 (H) 06/17/2021   CHOL 297 (H) 06/17/2021   TRIG 169.0 (H) 06/17/2021   HDL 44.00 06/17/2021   LDLDIRECT 144.0 10/14/2020   LDLCALC 220 (H) 06/17/2021   ALT 12 06/17/2021   AST 17  06/17/2021   NA 138 06/17/2021   K 4.4 06/17/2021   CL 100 06/17/2021   CREATININE 0.94 06/17/2021   BUN 15 06/17/2021   CO2 29 06/17/2021   TSH 1.23 06/17/2021   HGBA1C 6.1 06/17/2021   MICROALBUR 2.4 (H) 08/13/2020    CT ANGIO NECK W OR WO CONTRAST  Result Date: 05/27/2021 CLINICAL DATA:  Carotid artery stenosis. EXAM: CT ANGIOGRAPHY NECK TECHNIQUE: Multidetector CT imaging of the neck was performed using the standard protocol during bolus administration of intravenous contrast. Multiplanar CT image reconstructions and MIPs were obtained to evaluate the vascular anatomy. Carotid stenosis measurements (when applicable) are obtained utilizing NASCET criteria, using the distal internal carotid diameter as the denominator. RADIATION DOSE REDUCTION: This exam was performed according to the departmental dose-optimization program which includes automated exposure control, adjustment of the mA and/or kV according to patient size and/or use of iterative reconstruction technique. CONTRAST:  76m OMNIPAQUE IOHEXOL 350 MG/ML SOLN COMPARISON:  01/21/2011 FINDINGS: Aortic arch: Aortic atherosclerosis. No aneurysm or dissection at the arch. Branching pattern is normal. No innominate origin stenosis. Focal stenosis at the left common carotid artery origin with diameter 2.3 mm. The expected diameter in this location is 6-7 mm, indicating a 60-70% stenosis. There is atherosclerotic disease at the left subclavian artery origin with minimal diameter of 3.7 mm. Expected diameter is 7 mm, indicating approximately 50% stenosis. Right carotid system: Common carotid artery shows scattered plaque but is widely patent to the bifurcation. There is soft plaque at the carotid bifurcation and ICA bulb.  Minimal diameter in the ICA bulb measures 2.8 mm. Compared to a more distal cervical ICA diameter of 4 mm, this indicates a 30% stenosis. Left carotid system: The on the origin stenosis, the vessel shows scattered plaque with some  irregularity and ulceration. Soft and calcified plaque at the carotid bifurcation and ICA bulb with minimal diameter in the bulb measuring 2.1 mm. Compared to a more distal cervical ICA diameter of 4 mm, this indicates a 50% stenosis. Vertebral arteries: No subclavian stenosis on the right. Stenosis of the dominant right vertebral artery origin of 50-70%. Beyond that, the vessel is widely patent through the cervical region to the foramen magnum. As noted above, there is 50% stenosis of the proximal left subclavian artery. There is mild stenosis at the non dominant left vertebral artery origin. Beyond that, the small-vessel is patent through the cervical region to the foramen magnum. Skeleton: Mild cervical spondylosis. Extensive dental and periodontal disease. Other neck: No mass or lymphadenopathy. Upper chest: Mild emphysematous changes in the upper lobes. No focal or active process. Both internal carotid arteries are patent through the skull base and siphon regions with ordinary siphon atherosclerotic calcification. Both vertebral arteries are patent to the basilar. There is basilar artery atherosclerotic irregularity without flow limiting stenosis. IMPRESSION: Aortic Atherosclerosis (ICD10-I70.0) and Emphysema (ICD10-J43.9). Atherosclerotic disease affecting the right carotid system. Maximal stenosis in the ICA bulb is 30%. Atherosclerotic disease affecting the left carotid system, including irregular and possibly ulcerated plaque in the midportion of the common carotid artery. 60-70% stenosis at the left common carotid artery origin from the arch. Extensive plaque at the carotid bifurcation and ICA bulb with 50% stenosis in the ICA bulb. 50-70% stenosis at the origin of the dominant right vertebral artery. 50% stenosis of the proximal left subclavian artery with mild stenosis at the origin of the non dominant small left vertebral artery. Electronically Signed   By: Nelson Chimes M.D.   On: 05/27/2021 12:44        Assessment & Plan:   Problem List Items Addressed This Visit     Anemia    Check cbc and labs from neurology.        Relevant Orders   CBC with Differential/Platelet (Completed)   Folate RBC (Completed)   Asthma    Breathing stable.       CAD (coronary artery disease)    Followed by Dr Rockey Situ.  Stable.  Continue plavix and metoprolol.        Relevant Medications   aspirin EC 81 MG tablet   Carotid artery disease ([place])    Has been followed by [facility].  Carotid ultrasound as outlined.  Recommended f/u in 6 months.  Declines statin - intolerance.        Relevant Medications   aspirin EC 81 MG tablet   Crohn's disease ([place])    Saw GI.  Evaluated as outlined previously.  Has declined any further testing or f/u with GI.        Diabetes mellitus ([place])    Low carb diet and exercise.  Follow met b and a1c.  Declines medication.  Lab Results  Component Value Date   HGBA1C 6.1 06/17/2021       Relevant Medications   aspirin EC 81 MG tablet   Other Relevant Orders   Hemoglobin A1c (Completed)   Hypercholesterolemia    Intolerance to statin medication.  Discussed repatha.  Wants to discuss with cardiology.       Relevant Medications   aspirin  EC 81 MG tablet   Other Relevant Orders   Comprehensive metabolic panel (Completed)   Lipid Profile (Completed)   Hypertension - Primary    Continue metoprolol.  Blood pressure as outlined.  No changes.  Follow pressure. Follow metabolic panel.       Relevant Medications   aspirin EC 81 MG tablet   Other Relevant Orders   TSH (Completed)   Hypothyroidism    On thyroid replacement.  Follow tsh.       Relevant Orders   Comprehensive metabolic panel (Completed)   Memory change    Seeing neurology.  MRI planned.        Relevant Orders   RPR w/reflex to TrepSure (Completed)   B12 (Completed)   Vitamin B1   Vitamin D (25 hydroxy) (Completed)   Multiple sclerosis ([place])    Has desired no further intervention.  Follow.  MRI - planned.       Pancreatic insufficiency    She has declined to take pancreatic enzymes.  She is doing better.  Eating better.  Follow.       Tobacco abuse    Have discussed the need to quit smoking.  Desires to continue.        Einar Pheasant, MD

## 2021-06-18 LAB — COMPREHENSIVE METABOLIC PANEL
ALT: 12 U/L (ref 0–35)
AST: 17 U/L (ref 0–37)
Albumin: 4.1 g/dL (ref 3.5–5.2)
Alkaline Phosphatase: 75 U/L (ref 39–117)
BUN: 15 mg/dL (ref 6–23)
CO2: 29 mEq/L (ref 19–32)
Calcium: 9.9 mg/dL (ref 8.4–10.5)
Chloride: 100 mEq/L (ref 96–112)
Creatinine, Ser: 0.94 mg/dL (ref 0.40–1.20)
GFR: 60.18 mL/min (ref >60.00)
Glucose, Bld: 102 mg/dL — ABNORMAL HIGH (ref 70–99)
Potassium: 4.4 mEq/L (ref 3.5–5.1)
Sodium: 138 mEq/L (ref 135–145)
Total Bilirubin: 0.7 mg/dL (ref 0.2–1.2)
Total Protein: 7.1 g/dL (ref 6.0–8.3)

## 2021-06-18 LAB — LIPID PANEL
Cholesterol: 297 mg/dL — ABNORMAL HIGH (ref 0–200)
HDL: 44 mg/dL (ref >39.00)
LDL Cholesterol: 220 mg/dL — ABNORMAL HIGH (ref 0–99)
NonHDL: 253.31
Total CHOL/HDL Ratio: 7
Triglycerides: 169 mg/dL — ABNORMAL HIGH (ref 0.0–149.0)
VLDL: 33.8 mg/dL (ref 0.0–40.0)

## 2021-06-18 LAB — CBC WITH DIFFERENTIAL/PLATELET
Basophils Absolute: 0 10*3/uL (ref 0.0–0.1)
Basophils Relative: 0.4 % (ref 0.0–3.0)
Eosinophils Absolute: 0.3 10*3/uL (ref 0.0–0.7)
Eosinophils Relative: 3.1 % (ref 0.0–5.0)
HCT: 43.5 % (ref 36.0–46.0)
Hemoglobin: 14.8 g/dL (ref 12.0–15.0)
Lymphocytes Relative: 17 % (ref 12.0–46.0)
Lymphs Abs: 1.4 10*3/uL (ref 0.7–4.0)
MCHC: 34 g/dL (ref 30.0–36.0)
MCV: 85.3 fl (ref 78.0–100.0)
Monocytes Absolute: 0.4 10*3/uL (ref 0.1–1.0)
Monocytes Relative: 5.2 % (ref 3.0–12.0)
Neutro Abs: 6.1 10*3/uL (ref 1.4–7.7)
Neutrophils Relative %: 74.3 % (ref 43.0–77.0)
Platelets: 230 10*3/uL (ref 150.0–400.0)
RBC: 5.1 Mil/uL (ref 3.87–5.11)
RDW: 14 % (ref 11.5–15.5)
WBC: 8.2 10*3/uL (ref 4.0–10.5)

## 2021-06-18 LAB — TSH: TSH: 1.23 u[IU]/mL (ref 0.35–5.50)

## 2021-06-18 LAB — VITAMIN B12: Vitamin B-12: 214 pg/mL (ref 211–911)

## 2021-06-18 LAB — VITAMIN D 25 HYDROXY (VIT D DEFICIENCY, FRACTURES): VITD: 32.42 ng/mL (ref 30.00–100.00)

## 2021-06-18 LAB — HEMOGLOBIN A1C: Hgb A1c MFr Bld: 6.1 % (ref 4.6–6.5)

## 2021-06-19 ENCOUNTER — Encounter: Payer: Self-pay | Admitting: Internal Medicine

## 2021-06-19 LAB — RPR W/REFLEX TO TREPSURE

## 2021-06-19 NOTE — Assessment & Plan Note (Signed)
Has desired no further intervention.  Follow. MRI - planned.

## 2021-06-19 NOTE — Assessment & Plan Note (Signed)
On thyroid replacement.  Follow tsh.  

## 2021-06-19 NOTE — Assessment & Plan Note (Signed)
Seeing neurology.  MRI planned.

## 2021-06-19 NOTE — Assessment & Plan Note (Signed)
Followed by Dr Rockey Situ.  Stable.  Continue plavix and metoprolol.

## 2021-06-19 NOTE — Assessment & Plan Note (Signed)
Have discussed the need to quit smoking.  Desires to continue.

## 2021-06-19 NOTE — Assessment & Plan Note (Signed)
Saw GI.  Evaluated as outlined previously.  Has declined any further testing or f/u with GI.

## 2021-06-19 NOTE — Assessment & Plan Note (Signed)
Low carb diet and exercise.  Follow met b and a1c.  Declines medication.  Lab Results  Component Value Date   HGBA1C 6.1 06/17/2021

## 2021-06-19 NOTE — Assessment & Plan Note (Signed)
Breathing stable.

## 2021-06-19 NOTE — Assessment & Plan Note (Signed)
Check cbc and labs from neurology.

## 2021-06-19 NOTE — Assessment & Plan Note (Signed)
Intolerance to statin medication.  Discussed repatha.  Wants to discuss with cardiology.

## 2021-06-19 NOTE — Assessment & Plan Note (Signed)
She has declined to take pancreatic enzymes.  She is doing better.  Eating better.  Follow.

## 2021-06-19 NOTE — Assessment & Plan Note (Signed)
Has been followed by [facility].  Carotid ultrasound as outlined.  Recommended f/u in 6 months.  Declines statin - intolerance.

## 2021-06-19 NOTE — Assessment & Plan Note (Signed)
Continue metoprolol.  Blood pressure as outlined.  No changes.  Follow pressure. Follow metabolic panel.

## 2021-06-21 LAB — RPR W/REFLEX TO TREPSURE: RPR: NONREACTIVE

## 2021-06-21 LAB — T PALLIDUM ANTIBODY, EIA: T pallidum Antibody, EIA: NEGATIVE

## 2021-06-22 ENCOUNTER — Other Ambulatory Visit: Payer: Self-pay | Admitting: Internal Medicine

## 2021-06-23 LAB — VITAMIN B1: Vitamin B1 (Thiamine): 7 nmol/L — ABNORMAL LOW (ref 8–30)

## 2021-06-23 LAB — FOLATE RBC: RBC Folate: 446 ng/mL RBC (ref >280)

## 2021-06-29 ENCOUNTER — Ambulatory Visit
Admission: RE | Admit: 2021-06-29 | Discharge: 2021-06-29 | Disposition: A | Discharge status: Home / Self Care | Payer: Medicare Other | Source: Ambulatory Visit | Attending: Neurology | Admitting: Neurology

## 2021-06-29 ENCOUNTER — Other Ambulatory Visit: Payer: Self-pay

## 2021-06-29 ENCOUNTER — Emergency Department
Admission: EM | Admit: 2021-06-29 | Discharge: 2021-06-29 | Discharge status: Against Medical Advice | Payer: Medicare Other

## 2021-06-29 DIAGNOSIS — R220 Localized swelling, mass and lump, head: Secondary | ICD-10-CM | POA: Diagnosis not present

## 2021-06-29 DIAGNOSIS — G35 Multiple sclerosis: Secondary | ICD-10-CM

## 2021-06-29 DIAGNOSIS — I63331 Cerebral infarction due to thrombosis of right posterior cerebral artery: Secondary | ICD-10-CM | POA: Diagnosis not present

## 2021-06-29 MED ORDER — GADOBENATE DIMEGLUMINE 529 MG/ML IV SOLN
15.0000 mL | Freq: Once | INTRAVENOUS | Status: AC | PRN
  Administered 2021-06-29: 15 mL via INTRAVENOUS

## 2021-06-29 NOTE — ED Notes (Signed)
First Nurse Note:  Pt to ED via POV. Pt states that she was in [place] for an MRI and was told that she needed to come directly to the ED because her MRI was abnormal.

## 2021-06-29 NOTE — ED Notes (Signed)
Called for triage by [place], EDT. No answer. Pt not in lobby

## 2021-06-29 NOTE — ED Notes (Addendum)
Pt and visitor to front desk stating that pt was sent over from [place] for an abnormal MRI and that she should not still be waiting to be seen. Pt was informed that she is in line to be triaged. Pt and visitor upset stating that Dr. Trena Platt office told her she needed to be seen ASAP. Pt and visitor informed that I had spoke with spoken with neurology and that they had informed us that pts Symptoms has started 2 days ago and that since she was out of the code stroke window that she be seen and evaluated but we do not call code strokes. Pt confirmed that her symptoms started on Sunday but states that her MRI was done today. Pt and visitor upset. I explained that I had sent a message to MD here in ER to see if there was anything we needed to do while patient was waiting but that I was waiting on him to respond and that he was in am emergency at this time.

## 2021-06-29 NOTE — ED Notes (Signed)
Pt came back inside and stated that she was going home.

## 2021-06-29 NOTE — ED Notes (Signed)
Pt and visitor seen walking out of ED

## 2021-06-30 DIAGNOSIS — Z8673 Personal history of transient ischemic attack (TIA), and cerebral infarction without residual deficits: Secondary | ICD-10-CM | POA: Diagnosis not present

## 2021-06-30 DIAGNOSIS — F17210 Nicotine dependence, cigarettes, uncomplicated: Secondary | ICD-10-CM | POA: Diagnosis not present

## 2021-06-30 DIAGNOSIS — R4189 Other symptoms and signs involving cognitive functions and awareness: Secondary | ICD-10-CM | POA: Diagnosis not present

## 2021-06-30 DIAGNOSIS — I6523 Occlusion and stenosis of bilateral carotid arteries: Secondary | ICD-10-CM | POA: Diagnosis not present

## 2021-06-30 DIAGNOSIS — K5090 Crohn's disease, unspecified, without complications: Secondary | ICD-10-CM | POA: Diagnosis not present

## 2021-06-30 DIAGNOSIS — E7800 Pure hypercholesterolemia, unspecified: Secondary | ICD-10-CM | POA: Diagnosis not present

## 2021-07-06 ENCOUNTER — Other Ambulatory Visit: Payer: Self-pay

## 2021-07-06 ENCOUNTER — Ambulatory Visit (INDEPENDENT_AMBULATORY_CARE_PROVIDER_SITE_OTHER): Payer: Medicare Other | Admitting: *Deleted

## 2021-07-06 DIAGNOSIS — E538 Deficiency of other specified B group vitamins: Secondary | ICD-10-CM | POA: Diagnosis not present

## 2021-07-06 MED ORDER — CYANOCOBALAMIN 1000 MCG/ML IJ SOLN
1000.0000 ug | Freq: Once | INTRAMUSCULAR | Status: AC
  Administered 2021-07-06: 1000 ug via INTRAMUSCULAR

## 2021-07-06 NOTE — Progress Notes (Signed)
Pt arrived for 1/4 B12 injection, given in L deltoid. Pt tolerated injection well, showed no signs of distress nor voiced any concerns.

## 2021-07-08 ENCOUNTER — Other Ambulatory Visit: Payer: Self-pay | Admitting: Cardiovascular Disease

## 2021-07-09 NOTE — Telephone Encounter (Signed)
Attempted to schedule.  LMOV to call office.  ° °

## 2021-07-09 NOTE — Telephone Encounter (Signed)
Please contact pt for future appointment. Pt overdue for 6 month f/u. Pt needing refills. 

## 2021-07-13 ENCOUNTER — Ambulatory Visit: Payer: Medicare Other

## 2021-07-20 ENCOUNTER — Ambulatory Visit: Payer: Medicare Other

## 2021-07-27 ENCOUNTER — Ambulatory Visit: Payer: Medicare Other

## 2021-08-27 NOTE — Telephone Encounter (Signed)
Attempted to schedule.  LMOV to call office.  ° °

## 2021-09-01 ENCOUNTER — Telehealth: Payer: Self-pay | Admitting: Cardiovascular Disease

## 2021-09-01 ENCOUNTER — Other Ambulatory Visit: Payer: Self-pay

## 2021-09-01 MED ORDER — CLOPIDOGREL BISULFATE 75 MG PO TABS: 75.0000 mg | ORAL_TABLET | Freq: Every day | ORAL | 0 refills | Status: DC

## 2021-09-01 NOTE — Telephone Encounter (Signed)
?*  STAT* If patient is at the pharmacy, call can be transferred to refill team. ? ? ?1. Which medications need to be refilled? (please list name of each medication and dose if known) clopidogrel (PLAVIX) 75 MG tablet ? ?2. Which pharmacy/location (including street and city if local pharmacy) is medication to be sent to? [place], [place] ? ?3. Do they need a 30 day or 90 day supply? 90  ?

## 2021-09-01 NOTE — Telephone Encounter (Signed)
clopidogrel (PLAVIX) 75 MG tablet 90 tablet 0 09/01/2021    ?Sig - Route: Take 1 tablet (75 mg total) by mouth daily. - Oral   ? ?Pharmacy ? ?[place], [address]  ? ?

## 2021-09-07 NOTE — Telephone Encounter (Signed)
Scheduled

## 2021-10-04 NOTE — Progress Notes (Unsigned)
Date:  10/05/2021   ID:  Haley Santos, DOB Aug 28, 1945, MRN [id number]  Patient Location:  [address] [place] [address]   Provider location:   [facility], [place] office  PCP:  Einar Pheasant, MD  Cardiologist:  Arvid Right [facility]   Chief Complaint  Patient presents with   Follow-up    6 month follow up. Medications verbally reviewed with patient.     History of Present Illness:    Haley Santos is a 76 y.o. 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 Manuella Ghazi Request for a monitor to rule out atrial fibrillation as cause of embolic stroke  Head MRI imaging reviewed 1. Subacute PCA territory infarct in the posterior right temporal and lateral occipital lobe. 2. More remote infarcts in the left posterior temporal and lateral occipital lobe and medial right occipital lobe, also consistent with PCA territory infarcts. 3. On postcontrast imaging, there is apparent non opacification of the mid basilar artery. A CTA of the head and neck is recommended for further evaluation. 4. T2 hyperintense foci in the periventricular white matter. While this can be seen in the setting of demyelinating disease, the pattern is more consistent with chronic small vessel ischemic disease. No abnormal enhancement.  CT neck, reviewed Atherosclerotic disease affecting the left carotid system, including irregular and possibly ulcerated plaque in the midportion of the common carotid artery.  60-70% stenosis at the left common carotid artery origin from the arch. Extensive plaque at the carotid bifurcation and ICA bulb with 50% stenosis in the ICA bulb.   50-70% stenosis at the origin of the dominant right vertebral artery. 50% stenosis of the proximal left subclavian artery with mild stenosis at the origin of the non dominant small left vertebral artery.  Transaminitis April 2022 Stopped her statin and Zetia Cholesterol up to 300 on follow-up lab work Continues to smoke A1c down to 6.1  EKG personally reviewed by myself on todays visit Normal sinus rhythm rate 77 bpm no significant ST-T wave changes  Past medical history reviewed April 2016  cardiac catheterization, essentially occluded mid RCA with 50-70% mid LAD disease, DES placed the RCA.   History of breast cancer, Adriamycin through port in the left chest   Past Medical History:  Diagnosis Date   Asthma with bronchitis    Breast cancer ([place]) 2014   CAD (coronary artery disease)    a. 08/2014: inf STEMI s/p DES to RCA.    COPD (chronic obstructive pulmonary disease) (HCC)    Crohn's disease (HCC)    Dyspnea    DOE   GERD (gastroesophageal reflux disease)    Glaucoma    Glucose intolerance (impaired glucose tolerance)    a. HgA1c 6.2 during 08/2014 admission    Hypercholesterolemia    Hypertension    Hyperthyroidism    a. s/p ablation maintained on synthroid   Hypothyroidism    Multiple sclerosis ([place])    Myocardial infarction ([place])    2016  Neuromuscular disorder [facility])    MS   Stroke [facility])    November 2022   Tobacco abuse    Past Surgical History:  Procedure Laterality Date   AUGMENTATION MAMMAPLASTY Left    BREAST ENHANCEMENT SURGERY  1981   [place]   CATARACT EXTRACTION W/PHACO Left 07/19/2017   Procedure: CATARACT EXTRACTION PHACO AND INTRAOCULAR LENS PLACEMENT ([place]);  Surgeon: Birder Robson, MD;  Location: [facility] ORS;  Service: Ophthalmology;  Laterality: Left;  [place] 00:49.0 AP%  14.5 CDE 7.10 Fluid Pack Lot # [id number]   CATARACT EXTRACTION W/PHACO Right 08/09/2017   Procedure: CATARACT EXTRACTION PHACO AND INTRAOCULAR LENS PLACEMENT ([facility]);  Surgeon: Birder Robson, MD;  Location: [facility] ORS;  Service: Ophthalmology;  Laterality: Right;  [place] 00:42 AP% 13.8 CDE 5.88 Fluid pack lot #[id number]   COLONOSCOPY  2010   Dr Vira Agar   CORONARY ANGIOPLASTY     STENT 2016   LEFT HEART CATHETERIZATION WITH CORONARY ANGIOGRAM N/A 09/01/2014   Procedure: LEFT HEART CATHETERIZATION WITH CORONARY ANGIOGRAM;  Surgeon: Peter M Martinique, MD;  Location: [facility] CATH LAB;  Service: Cardiovascular;  Laterality: N/A;   MASTECTOMY Right 2014   PATRICAL MASTECOMY RIGHT RAD CHEMO   PORTACATH PLACEMENT  11/07/2012   RIGHT OOPHORECTOMY  1979   TONSILLECTOMY AND ADENOIDECTOMY  1957     Allergies:   Statins, Augmentin [amoxicillin-pot clavulanate], Clindamycin/lincomycin, Codeine, Shellfish allergy, and Valium [diazepam]   Social History   Tobacco Use   Smoking status: Every Day    Packs/day: 1.00    Years: 40.00    Pack years: 40.00    Types: Cigarettes    Last attempt to quit: 07/01/2015    Years since quitting: 6.2   Smokeless tobacco: Never  Vaping Use   Vaping Use: Never used  Substance Use Topics   Alcohol use: Yes    Alcohol/week: 0.0 standard drinks    Comment: RARE   Drug use: No     Current Outpatient Medications on File Prior to Visit  Medication Sig Dispense Refill   aspirin EC 81 MG tablet Take 81 mg by mouth daily. Swallow whole.     clopidogrel (PLAVIX) 75 MG tablet Take 1 tablet (75 mg total) by mouth daily. 90 tablet 0   fluticasone-salmeterol (ADVAIR) 250-50 MCG/ACT AEPB INHALE 1 PUFF TWICE DAILY 180 each 0   latanoprost (XALATAN) 0.005 % ophthalmic solution Place 1 drop into both eyes at bedtime.      levothyroxine (SYNTHROID) 100 MCG tablet TAKE 1 TABLET EVERY DAY 90 tablet 1   metoprolol tartrate (LOPRESSOR) 25 MG tablet Take 1 tablet (25 mg total) by mouth daily.  PLEASE CALL OFFICE TO SCHEDULE AN APPOINTMENT FOR FURTHER REFILLS. NO FURTHER REFILLS UNTIL SEEN IN CLINIC. 90 tablet 0   VENTOLIN HFA 108 (90 Base) MCG/ACT inhaler INHALE 2 PUFFS BY MOUTH EVERY 6 HOURS AS NEEDED FOR WHEEZING FOR SHORTNESS OF BREATH 54 g 0   No current facility-administered medications on file prior to visit.     Family Hx: The patient's family history includes Diabetes in her mother; Glaucoma in her mother; Berenice Primas' disease in her mother; Heart attack (age of onset: 68) in her mother; Heart disease in her mother and another family member; Lung disease in her father; Peripheral vascular disease in an other family member. There is no history of Breast cancer.  ROS:   Please see the history of present illness.    Review of Systems  Constitutional:  Positive for malaise/fatigue and weight loss.  HENT:  Negative.    Respiratory: Negative.    Cardiovascular: Negative.   Gastrointestinal:  Positive for abdominal pain.  Musculoskeletal: Negative.   Neurological: Negative.   Psychiatric/Behavioral: Negative.    All other systems reviewed and are negative.   Labs/Other Tests and Data Reviewed:    Recent Labs: 12/21/2020: Magnesium 1.9 06/17/2021: ALT 12; BUN 15; Creatinine, Ser 0.94; Hemoglobin 14.8; Platelets 230.0; Potassium 4.4; Sodium 138; TSH 1.23   Recent Lipid Panel Lab Results  Component Value Date/Time   CHOL 297 (H) 06/17/2021 03:09 PM   TRIG 169.0 (H) 06/17/2021 03:09 PM   HDL 44.00 06/17/2021 03:09 PM   CHOLHDL 7 06/17/2021 03:09 PM   LDLCALC 220 (H) 06/17/2021 03:09 PM   LDLDIRECT 144.0 10/14/2020 02:07 PM    Wt Readings from Last 3 Encounters:  10/05/21 157 lb (71.2 kg)  06/17/21 165 lb 9.6 oz (75.1 kg)  05/31/21 167 lb (75.8 kg)     Exam:    Vital Signs: Vital signs may also be detailed in the HPI BP 130/62 (BP Location: Left Arm, Patient Position: Sitting, Cuff Size: Normal)   Pulse 77   Ht 5' 6"  (1.676 m)   Wt 157 lb (71.2 kg)   LMP 06/15/1977    SpO2 98%   BMI 25.34 kg/m   Constitutional:  oriented to person, place, and time. No distress.  HENT:  Head: Grossly normal Eyes:  no discharge. No scleral icterus.  Neck: No JVD, no carotid bruits  Cardiovascular: Regular rate and rhythm, no murmurs appreciated Pulmonary/Chest: Clear to auscultation bilaterally, no wheezes or rails Abdominal: Soft.  no distension.  no tenderness.  Musculoskeletal: Normal range of motion Neurological:  normal muscle tone. Coordination normal. No atrophy Skin: Skin warm and dry Psychiatric: normal affect, pleasant   ASSESSMENT & PLAN:    Problem List Items Addressed This Visit       Cardiology Problems   Stroke [facility])   Hypertension   Relevant Orders   EKG 12-Lead   Hypercholesterolemia   Relevant Orders   EKG 12-Lead   Carotid artery disease (HCC)   Relevant Orders   EKG 12-Lead   CAD (coronary artery disease) - Primary   Relevant Orders   EKG 12-Lead   Other Visit Diagnoses     PAD (peripheral artery disease) ([place])       Relevant Orders   EKG 12-Lead   Smoker         Cad with stable angina Currently with no symptoms of angina. No further workup at this time. Continue current medication regimen. Does not want a statin given prior history of transaminitis Willing to retry Zetia 10 mg daily, prescription sent in Does not want PCSK9 inhibitor injection Potentially could also try bempedoic acid with Zetia  PAD Smoking cessation recommended, she has no desire to quit Followed by Dr. Delana Meyer, severe carotid disease, cerebrovascular disease, recent strokes  Hyperlipidemia Management as above with Zetia, Does not want a statin or PCSK9 inhibitor  Chronic diarrhea/Crohn's Followed by GI Symptoms stable  HTN Elevated blood pressure on today's visit, had been well controlled previously with primary care No changes made Recommend she closely monitor blood pressure at home and call us if it continues to run high     Signed, Ida Rogue, MD  [address], [place], [place] [id number]

## 2021-10-05 ENCOUNTER — Ambulatory Visit: Payer: Medicare Other | Admitting: Cardiovascular Disease

## 2021-10-05 ENCOUNTER — Encounter: Payer: Self-pay | Admitting: Cardiovascular Disease

## 2021-10-05 ENCOUNTER — Ambulatory Visit (INDEPENDENT_AMBULATORY_CARE_PROVIDER_SITE_OTHER): Payer: Medicare Other

## 2021-10-05 ENCOUNTER — Ambulatory Visit (INDEPENDENT_AMBULATORY_CARE_PROVIDER_SITE_OTHER): Payer: Medicare Other | Admitting: Cardiovascular Disease

## 2021-10-05 VITALS — BP 130/62 | HR 77 | Ht 66.0 in | Wt 157.0 lb

## 2021-10-05 DIAGNOSIS — I6389 Other cerebral infarction: Secondary | ICD-10-CM

## 2021-10-05 DIAGNOSIS — F17200 Nicotine dependence, unspecified, uncomplicated: Secondary | ICD-10-CM | POA: Diagnosis not present

## 2021-10-05 DIAGNOSIS — M791 Myalgia, unspecified site: Secondary | ICD-10-CM

## 2021-10-05 DIAGNOSIS — E7800 Pure hypercholesterolemia, unspecified: Secondary | ICD-10-CM | POA: Diagnosis not present

## 2021-10-05 DIAGNOSIS — T466X5A Adverse effect of antihyperlipidemic and antiarteriosclerotic drugs, initial encounter: Secondary | ICD-10-CM

## 2021-10-05 DIAGNOSIS — I25118 Atherosclerotic heart disease of native coronary artery with other forms of angina pectoris: Secondary | ICD-10-CM | POA: Diagnosis not present

## 2021-10-05 DIAGNOSIS — I10 Essential (primary) hypertension: Secondary | ICD-10-CM | POA: Diagnosis not present

## 2021-10-05 DIAGNOSIS — I739 Peripheral vascular disease, unspecified: Secondary | ICD-10-CM | POA: Diagnosis not present

## 2021-10-05 DIAGNOSIS — I639 Cerebral infarction, unspecified: Secondary | ICD-10-CM

## 2021-10-05 DIAGNOSIS — I6523 Occlusion and stenosis of bilateral carotid arteries: Secondary | ICD-10-CM | POA: Diagnosis not present

## 2021-10-05 MED ORDER — CLOPIDOGREL BISULFATE 75 MG PO TABS: 75.0000 mg | ORAL_TABLET | Freq: Every day | ORAL | 3 refills | Status: DC

## 2021-10-05 MED ORDER — EZETIMIBE 10 MG PO TABS: 10.0000 mg | ORAL_TABLET | Freq: Every day | ORAL | 3 refills | Status: DC

## 2021-10-05 MED ORDER — METOPROLOL TARTRATE 25 MG PO TABS: 25.0000 mg | ORAL_TABLET | Freq: Every day | ORAL | 3 refills | Status: DC

## 2021-10-05 NOTE — Patient Instructions (Addendum)
Medication Instructions:  Please restart zetia 10 mg daily  If you need a refill on your cardiac medications before your next appointment, please call your pharmacy.   Lab work: No new labs needed  Testing/Procedures:  Your provider has ordered a heart monitor to wear for 14 days. This will be mailed to your home with instructions on placement. Once you have finished the time frame requested, you will return monitor in box provided.     Follow-Up: At [facility], you and your health needs are our priority.  As part of our continuing mission to provide you with exceptional heart care, we have created designated Provider Care Teams.  These Care Teams include your primary Cardiologist (physician) and Advanced Practice Providers (APPs -  Physician Assistants and Nurse Practitioners) who all work together to provide you with the care you need, when you need it.  You will need a follow up appointment in 6 months, APP ok  Providers on your designated Care Team:   Murray Hodgkins, NP Christell Faith, PA-C Cadence Kathlen Mody, [place]  COVID-19 Vaccine Information can be found at: [email or web address] For questions related to vaccine distribution or appointments, please email [email or web address] or call [phone].

## 2021-10-07 ENCOUNTER — Other Ambulatory Visit: Payer: Self-pay | Admitting: Internal Medicine

## 2021-10-08 DIAGNOSIS — I25118 Atherosclerotic heart disease of native coronary artery with other forms of angina pectoris: Secondary | ICD-10-CM

## 2021-10-08 DIAGNOSIS — I6389 Other cerebral infarction: Secondary | ICD-10-CM | POA: Diagnosis not present

## 2021-10-26 DIAGNOSIS — I6389 Other cerebral infarction: Secondary | ICD-10-CM | POA: Diagnosis not present

## 2021-10-26 DIAGNOSIS — I25118 Atherosclerotic heart disease of native coronary artery with other forms of angina pectoris: Secondary | ICD-10-CM | POA: Diagnosis not present

## 2021-11-05 ENCOUNTER — Telehealth: Payer: Self-pay | Admitting: Emergency Medicine

## 2021-11-28 NOTE — Progress Notes (Unsigned)
MRN : [id number]  Haley Santos is a 76 y.o. (Sep 23, 1945) female who presents with chief complaint of check circulation.  History of Present Illness:   The patient is seen for follow up evaluation of carotid stenosis status post CT angiogram. CT scan was done 05/27/2020. Patient reports that the test went well with no problems or complications.    The patient denies interval amaurosis fugax. There is no recent or interval TIA symptoms or focal motor deficits. There is no prior documented CVA.   The patient is taking enteric-coated aspirin 81 mg daily.   There is no history of migraine headaches. There is no history of seizures.   The patient has a history of coronary artery disease, no recent episodes of angina or shortness of breath. The patient denies PAD or claudication symptoms. There is a history of hyperlipidemia which is being treated with a statin.     CT angiogram is reviewed by me personally and shows 50% stenosis consistent with calcified plaque at the origin of the left internal carotid artery.  There is also a 60% stenosis of the origin of the left common carotid.   Of note the duplex ultrasound today  shows RICA 1-39% with moderate common carotid stenosis and LICA 31-51% (the velocities of the common carotid arteries are similar bilaterally suggesting the origin lesion of the left common carotid is not yet significant.)  No outpatient medications have been marked as taking for the 11/29/21 encounter (Appointment) with Delana Meyer, Dolores Lory, MD.    Past Medical History:  Diagnosis Date   Asthma with bronchitis    Breast cancer ([place]) 2014   CAD (coronary artery disease)    a. 08/2014: inf STEMI s/p DES to RCA.    COPD (chronic obstructive pulmonary disease) (HCC)    Crohn's disease (HCC)    Dyspnea    DOE   GERD (gastroesophageal reflux disease)    Glaucoma    Glucose intolerance (impaired glucose tolerance)    a. HgA1c 6.2 during 08/2014 admission     Hypercholesterolemia    Hypertension    Hyperthyroidism    a. s/p ablation maintained on synthroid   Hypothyroidism    Multiple sclerosis ([place])    Myocardial infarction ([place])    2016   Neuromuscular disorder ([place])    MS   Stroke [facility])    November 2022   Tobacco abuse     Past Surgical History:  Procedure Laterality Date   AUGMENTATION MAMMAPLASTY Left    BREAST ENHANCEMENT SURGERY  1981   [place]   CATARACT EXTRACTION W/PHACO Left 07/19/2017   Procedure: CATARACT EXTRACTION PHACO AND INTRAOCULAR LENS PLACEMENT ([place]);  Surgeon: Birder Robson, MD;  Location: [facility] ORS;  Service: Ophthalmology;  Laterality: Left;  [place] 00:49.0 AP% 14.5 CDE 7.10 Fluid Pack Lot # [id number]   CATARACT EXTRACTION W/PHACO Right 08/09/2017   Procedure: CATARACT EXTRACTION PHACO AND INTRAOCULAR LENS PLACEMENT ([facility]);  Surgeon: Birder Robson, MD;  Location: [facility] ORS;  Service: Ophthalmology;  Laterality: Right;  [place] 00:42 AP% 13.8 CDE 5.88 Fluid pack lot #[id number] H   COLONOSCOPY  2010   Dr Vira Agar   CORONARY ANGIOPLASTY     STENT 2016   LEFT HEART CATHETERIZATION WITH CORONARY ANGIOGRAM N/A 09/01/2014   Procedure: LEFT HEART CATHETERIZATION WITH CORONARY ANGIOGRAM;  Surgeon: Peter M Martinique, MD;  Location[facility] C[facility] LAB;  Service: Cardiovascular;  Laterality: N/A;   MASTECTOMY Right  2014   PATRICAL MASTECOMY RIGHT RAD CHEMO   PORTACATH PLACEMENT  11/07/2012   RIGHT OOPHORECTOMY  1979   TONSILLECTOMY AND ADENOIDECTOMY  1957    Social History Social History   Tobacco Use   Smoking status: Every Day    Packs/day: 1.00    Years: 40.00    Total pack years: 40.00    Types: Cigarettes    Last attempt to quit: 07/01/2015    Years since quitting: 6.4   Smokeless tobacco: Never  Vaping Use   Vaping Use: Never used  Substance Use Topics   Alcohol use: Yes    Alcohol/week: 0.0 standard drinks of alcohol    Comment: RARE   Drug use: No    Family History Family History  Problem Relation  Age of Onset   Diabetes Mother    Heart disease Mother        myocardial infarction   Glaucoma Mother    Berenice Primas' disease Mother    Heart attack Mother 62   Lung disease Father    Heart disease Other        half sister   Peripheral vascular disease Other        half sister   Breast cancer Neg Hx     Allergies  Allergen Reactions   Statins Other (See Comments)    Muscle weakness/pain. Trials of multiple agents. Tolerating rosuvastatin   Augmentin [Amoxicillin-Pot Clavulanate]     Pt reported had rash after taking.  (notifed 08/13/20 appt)   Clindamycin/Lincomycin Other (See Comments)    Digestive upset   Codeine Other (See Comments)    "hallucinations"   Shellfish Allergy Nausea And Vomiting    Felt goofy headed   Valium [Diazepam] Anxiety and Other (See Comments)    hyperactivity     REVIEW OF SYSTEMS (Negative unless checked)  Constitutional: [] Weight loss  [] Fever  [] Chills Cardiac: [] Chest pain   [] Chest pressure   [] Palpitations   [] Shortness of breath when laying flat   [] Shortness of breath with exertion. Vascular:  [x] Pain in legs with walking   [] Pain in legs at rest  [] History of DVT   [] Phlebitis   [] Swelling in legs   [] Varicose veins   [] Non-healing ulcers Pulmonary:   [] Uses home oxygen   [] Productive cough   [] Hemoptysis   [] Wheeze  [] COPD   [] Asthma Neurologic:  [] Dizziness   [] Seizures   [] History of stroke   [] History of TIA  [] Aphasia   [] Vissual changes   [] Weakness or numbness in arm   [] Weakness or numbness in leg Musculoskeletal:   [] Joint swelling   [] Joint pain   [] Low back pain Hematologic:  [] Easy bruising  [] Easy bleeding   [] Hypercoagulable state   [] Anemic Gastrointestinal:  [] Diarrhea   [] Vomiting  [] Gastroesophageal reflux/heartburn   [] Difficulty swallowing. Genitourinary:  [] Chronic kidney disease   [] Difficult urination  [] Frequent urination   [] Blood in urine Skin:  [] Rashes   [] Ulcers  Psychological:  [] History of anxiety   []  History of  major depression.  Physical Examination  There were no vitals filed for this visit. There is no height or weight on file to calculate BMI. Gen: WD/WN, NAD Head: [place]/AT, No temporalis wasting.  Ear/Nose/Throat: Hearing grossly intact, nares w/o erythema or drainage Eyes: PER, EOMI, sclera nonicteric.  Neck: Supple, no masses.  No bruit or JVD.  Pulmonary:  Good air movement, no audible wheezing, no use of accessory muscles.  Cardiac: RRR, normal S1, S2, no Murmurs. Vascular:  mild trophic changes,  no open wounds Vessel Right Left  Radial Palpable Palpable  Carotid Palpable Palpable  DP Not Palpable Not Palpable  Gastrointestinal: soft, non-distended. No guarding/no peritoneal signs.  Musculoskeletal: M/S 5/5 throughout.  No visible deformity.  Neurologic: CN 2-12 intact. Pain and light touch intact in extremities.  Symmetrical.  Speech is fluent. Motor exam as listed above. Psychiatric: Judgment intact, Mood & affect appropriate for pt's clinical situation. Dermatologic: No rashes or ulcers noted.  No changes consistent with cellulitis.   CBC Lab Results  Component Value Date   WBC 8.2 06/17/2021   HGB 14.8 06/17/2021   HCT 43.5 06/17/2021   MCV 85.3 06/17/2021   PLT 230.0 06/17/2021    BMET    Component Value Date/Time   NA 138 06/17/2021 1509   NA 140 12/18/2020 1330   NA 139 05/01/2014 1008   K 4.4 06/17/2021 1509   K 4.0 05/01/2014 1008   CL 100 06/17/2021 1509   CL 102 05/01/2014 1008   CO2 29 06/17/2021 1509   CO2 30 05/01/2014 1008   GLUCOSE 102 (H) 06/17/2021 1509   GLUCOSE 107 (H) 05/01/2014 1008   BUN 15 06/17/2021 1509   BUN 7 (L) 12/18/2020 1330   BUN 13 05/01/2014 1008   CREATININE 0.94 06/17/2021 1509   CREATININE 0.91 05/01/2014 1008   CALCIUM 9.9 06/17/2021 1509   CALCIUM 9.1 05/01/2014 1008   GFRNONAA >60 12/21/2020 1319   GFRNONAA >60 05/01/2014 1008   GFRNONAA 57 (L) 01/29/2014 1021   GFRAA >60 07/04/2015 1445   GFRAA >60 05/01/2014 1008    GFRAA >60 01/29/2014 1021   CrCl cannot be calculated (Patient's most recent lab result is older than the maximum 21 days allowed.).  COAG No results found for: "INR", "PROTIME"  Radiology No results found.   Assessment/Plan 1. Bilateral carotid artery stenosis Recommend:   Given the patient's asymptomatic subcritical stenosis no further invasive testing or surgery at this time.   CT angiogram is reviewed by me personally and shows 50% stenosis consistent with calcified plaque at the origin of the left internal carotid artery.  There is also a 60% stenosis of the origin of the left common carotid.   Of note the duplex ultrasound dated 04/05/2021 shows the velocities of the common carotid arteries are similar bilaterally suggesting the origin lesion of the left common carotid is not yet significant   Continue antiplatelet therapy as prescribed Continue management of CAD, HTN and Hyperlipidemia Healthy heart diet,  encouraged exercise at least 4 times per week Follow up in 6 months with duplex ultrasound and physical exam. - VAS US CAROTID; Future  2. PAD (peripheral artery disease) (HCC)  Recommend:  The patient has evidence of atherosclerosis of the lower extremities with claudication.  The patient does not voice lifestyle limiting changes at this point in time.  No invasive studies, angiography or surgery at this time The patient should continue walking and begin a more formal exercise program.  The patient should continue antiplatelet therapy and aggressive treatment of the lipid abnormalities  No changes in the patient's medications at this time  Continued surveillance is indicated as atherosclerosis is likely to progress with time.    The patient will continue follow up with noninvasive studies as ordered.   - VAS [place] ABI WITH/WO TBI; Future  3. Coronary artery disease of native artery of native heart with stable angina pectoris (HCC) Continue cardiac and  antihypertensive medications as already ordered and reviewed, no changes at this time.  Continue statin as ordered and reviewed, no changes at this time  Nitrates PRN for chest pain   4. Chronic venous insufficiency Recommend:  No surgery or intervention at this point in time.  I have reviewed my discussion with the patient regarding venous insufficiency and why it causes symptoms. I have discussed with the patient the chronic skin changes that accompany venous insufficiency and the long term sequela such as ulceration. Patient will contnue wearing graduated compression stockings on a daily basis, as this has provided excellent control of his edema. The patient will put the stockings on first thing in the morning and removing them in the evening. The patient is reminded not to sleep in the stockings.  In addition, behavioral modification including elevation during the day will be initiated. Exercise is strongly encouraged.  Previous duplex ultrasound of the lower extremities shows normal deep system, no significant superficial reflux was identified.  Given the patient's good control and lack of any problems regarding the venous insufficiency and lymphedema a lymph pump in not need at this time.    5. Primary hypertension Continue antihypertensive medications as already ordered, these medications have been reviewed and there are no changes at this time.   6. Type 2 diabetes mellitus with hyperglycemia, without long-term current use of insulin (HCC) Continue hypoglycemic medications as already ordered, these medications have been reviewed and there are no changes at this time.  Hgb A1C to be monitored as already arranged by primary service   7. Hypercholesterolemia Continue statin as ordered and reviewed, no changes at this time    Hortencia Pilar, MD  11/28/2021 4:22 PM

## 2021-11-29 ENCOUNTER — Ambulatory Visit (INDEPENDENT_AMBULATORY_CARE_PROVIDER_SITE_OTHER): Payer: Medicare Other

## 2021-11-29 ENCOUNTER — Encounter (INDEPENDENT_AMBULATORY_CARE_PROVIDER_SITE_OTHER): Payer: Self-pay | Admitting: Vascular Surgery

## 2021-11-29 ENCOUNTER — Ambulatory Visit (INDEPENDENT_AMBULATORY_CARE_PROVIDER_SITE_OTHER): Payer: Medicare Other | Admitting: Vascular Surgery

## 2021-11-29 VITALS — BP 123/62 | HR 71 | Resp 16 | Wt 161.8 lb

## 2021-11-29 DIAGNOSIS — I872 Venous insufficiency (chronic) (peripheral): Secondary | ICD-10-CM

## 2021-11-29 DIAGNOSIS — I739 Peripheral vascular disease, unspecified: Secondary | ICD-10-CM

## 2021-11-29 DIAGNOSIS — I10 Essential (primary) hypertension: Secondary | ICD-10-CM

## 2021-11-29 DIAGNOSIS — I25118 Atherosclerotic heart disease of native coronary artery with other forms of angina pectoris: Secondary | ICD-10-CM | POA: Diagnosis not present

## 2021-11-29 DIAGNOSIS — I6523 Occlusion and stenosis of bilateral carotid arteries: Secondary | ICD-10-CM | POA: Diagnosis not present

## 2021-11-29 DIAGNOSIS — E1165 Type 2 diabetes mellitus with hyperglycemia: Secondary | ICD-10-CM | POA: Diagnosis not present

## 2021-11-29 DIAGNOSIS — E7800 Pure hypercholesterolemia, unspecified: Secondary | ICD-10-CM

## 2021-12-01 DIAGNOSIS — Z961 Presence of intraocular lens: Secondary | ICD-10-CM | POA: Diagnosis not present

## 2021-12-01 DIAGNOSIS — H401131 Primary open-angle glaucoma, bilateral, mild stage: Secondary | ICD-10-CM | POA: Diagnosis not present

## 2021-12-20 ENCOUNTER — Telehealth: Payer: Self-pay | Admitting: Family

## 2021-12-20 ENCOUNTER — Other Ambulatory Visit: Payer: Self-pay | Admitting: Internal Medicine

## 2021-12-23 NOTE — Telephone Encounter (Signed)
[facility] called for pt needing a refill on fluticasone-salmeterol. Pt was on the phone also

## 2021-12-27 ENCOUNTER — Other Ambulatory Visit: Payer: Self-pay

## 2021-12-27 MED ORDER — FLUTICASONE-SALMETEROL 250-50 MCG/ACT IN AEPB: INHALATION_SPRAY | RESPIRATORY_TRACT | 3 refills | Status: DC

## 2021-12-27 NOTE — Telephone Encounter (Signed)
sent 

## 2021-12-27 NOTE — Telephone Encounter (Signed)
Pt called about medication update. This is the second time that this has happen regarding her medication and pt is out if it. Pt does not want panda webb on her medicaton

## 2022-03-14 ENCOUNTER — Encounter (INDEPENDENT_AMBULATORY_CARE_PROVIDER_SITE_OTHER): Payer: Self-pay

## 2022-03-26 ENCOUNTER — Other Ambulatory Visit: Payer: Self-pay | Admitting: Cardiovascular Disease

## 2022-04-09 NOTE — Progress Notes (Signed)
Date:  04/12/2022   ID:  Haley Santos, DOB 1945-10-28, MRN [id number]  Patient Location:  [address] [place] [address]   Provider location:   [facility], [place] office  PCP:  Einar Pheasant, MD  Cardiologist:  Arvid Right [facility]   Chief Complaint  Patient presents with   6 month follow up     "Doing well." Medications reviewed by the patient verbally.     History of Present Illness:    Haley Santos is a 76 y.o. 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, 50% STEMI in 2016 with stent placed to mid RCA,  history of Crohn's Diabetes type 2 with hemoglobin A1c 7.4 who presents for routne follow-up of her coronary artery disease, hyperlipidemia   Last seen in clinic May 2023  Feels well, reports that she continues to smoke 1 pack/day No chest pain concerning for angina No regular exercise program  Reports that she is taking Zetia, does not want a statin given prior side effects Transaminitis April 2022, at that time stopped statin Cholesterol running 300 Does not want PCSK9 inhibitor or other injection Prefers no additional medications Concerned that metoprolol may be contributing to fatigue Currently takes metoprolol tartrate 25 mg once a day  EKG personally reviewed by myself on todays visit Normal sinus rhythm rate 75 bpm right bundle branch block no significant ST-T wave changes  Other past medical history reviewed Zio monitor June 2023 with no significant arrhythmia Previously seen by Neurology, Dr. Manuella Ghazi Request for a monitor to rule out atrial fibrillation as cause of embolic stroke  Head MRI  1. Subacute PCA territory infarct in the posterior right temporal and lateral occipital lobe. 2. More remote infarcts in the left  posterior temporal and lateral occipital lobe and medial right occipital lobe, also consistent with PCA territory infarcts. 3. On postcontrast imaging, there is apparent non opacification of the mid basilar artery. A CTA of the head and neck is recommended for further evaluation. 4. T2 hyperintense foci in the periventricular white matter. While this can be seen in the setting of demyelinating disease, the pattern is more consistent with chronic small vessel ischemic disease. No abnormal enhancement.  CT neck,  Atherosclerotic disease affecting the left carotid system, including irregular and possibly ulcerated plaque in the midportion of the common carotid artery. 60-70% stenosis at the left common carotid artery origin from the arch. Extensive plaque at the carotid bifurcation and ICA bulb with 50% stenosis in the ICA bulb.   50-70% stenosis at the origin of the dominant right vertebral artery. 50% stenosis of the proximal left subclavian artery with mild stenosis at the origin of the non dominant small left vertebral artery.  April 2016  cardiac catheterization, essentially occluded mid RCA with 50-70% mid LAD disease, DES placed the RCA.   History of breast cancer, Adriamycin through port in the left chest   Past Medical History:  Diagnosis Date   Asthma with bronchitis    Breast cancer ([place]) 2014   CAD (coronary artery disease)    a. 08/2014: inf STEMI s/p DES to RCA.    COPD (chronic obstructive pulmonary disease) (HCC)    Crohn's disease (HCC)    Dyspnea    DOE   GERD (gastroesophageal reflux disease)  Glaucoma    Glucose intolerance (impaired glucose tolerance)    a. HgA1c 6.2 during 08/2014 admission    Hypercholesterolemia    Hypertension    Hyperthyroidism    a. s/p ablation maintained on synthroid   Hypothyroidism    Multiple sclerosis ([place])    Myocardial infarction ([place])    2016   Neuromuscular disorder ([place])    MS   Stroke [facility])    November 2022    Tobacco abuse    Past Surgical History:  Procedure Laterality Date   AUGMENTATION MAMMAPLASTY Left    BREAST ENHANCEMENT SURGERY  1981   [place]   CATARACT EXTRACTION W/PHACO Left 07/19/2017   Procedure: CATARACT EXTRACTION PHACO AND INTRAOCULAR LENS PLACEMENT ([place]);  Surgeon: Birder Robson, MD;  Location: [facility] ORS;  Service: Ophthalmology;  Laterality: Left;  [place] 00:49.0 AP% 14.5 CDE 7.10 Fluid Pack Lot # [id number]   CATARACT EXTRACTION W/PHACO Right 08/09/2017   Procedure: CATARACT EXTRACTION PHACO AND INTRAOCULAR LENS PLACEMENT (I[facility]);  Surgeon: Birder Robson, MD;  Location: [facility] ORS;  Service: Ophthalmology;  Laterality: Right;  [place] 00:42 AP% 13.8 CDE 5.88 Fluid pack lot #[id number]   COLONOSCOPY  2010   Dr Vira Agar   CORONARY ANGIOPLASTY     STENT 2016   LEFT HEART CATHETERIZATION WITH CORONARY ANGIOGRAM N/A 09/01/2014   Procedure: LEFT HEART CATHETERIZATION WITH CORONARY ANGIOGRAM;  Surgeon: Peter M Martinique, MD;  Location: [facility] CATH LAB;  Service: Cardiovascular;  Laterality: N/A;   MASTECTOMY Right 2014   PATRICAL MASTECOMY RIGHT RAD CHEMO   PORTACATH PLACEMENT  11/07/2012   RIGHT OOPHORECTOMY  1979   TONSILLECTOMY AND ADENOIDECTOMY  1957     Allergies:   Statins, Augmentin [amoxicillin-pot clavulanate], Clindamycin/lincomycin, Codeine, Shellfish allergy, and Valium [diazepam]   Social History   Tobacco Use   Smoking status: Every Day    Packs/day: 1.00    Years: 40.00    Total pack years: 40.00    Types: Cigarettes    Last attempt to quit: 07/01/2015    Years since quitting: 6.7   Smokeless tobacco: Never  Vaping Use   Vaping Use: Never used  Substance Use Topics   Alcohol use: Yes    Alcohol/week: 0.0 standard drinks of alcohol    Comment: RARE   Drug use: No     Current Outpatient Medications on File Prior to Visit  Medication Sig Dispense Refill   aspirin EC 81 MG tablet Take 81 mg by mouth daily. Swallow whole.     clopidogrel (PLAVIX) 75 MG tablet  Take 1 tablet (75 mg total) by mouth daily. 90 tablet 3   ezetimibe (ZETIA) 10 MG tablet Take 1 tablet (10 mg total) by mouth daily. 90 tablet 3   fluticasone-salmeterol (ADVAIR) 250-50 MCG/ACT AEPB INHALE 1 PUFF TWICE DAILY 180 each 3   latanoprost (XALATAN) 0.005 % ophthalmic solution Place 1 drop into both eyes at bedtime.      levothyroxine (SYNTHROID) 100 MCG tablet TAKE 1 TABLET EVERY DAY 90 tablet 1   metoprolol tartrate (LOPRESSOR) 25 MG tablet Take 1 tablet (25 mg total) by mouth daily. 90 tablet 3   VENTOLIN HFA 108 (90 Base) MCG/ACT inhaler INHALE 2 PUFFS BY MOUTH EVERY 6 HOURS AS NEEDED FOR WHEEZING OR SHORTNESS OF BREATH 54 g 0   No current facility-administered medications on file prior to visit.     Family Hx: The patient's family history includes Diabetes in her mother; Glaucoma in her mother; Berenice Primas' disease in her mother; Heart  attack (age of onset: 93) in her mother; Heart disease in her mother and another family member; Lung disease in her father; Peripheral vascular disease in an other family member. There is no history of Breast cancer.  ROS:   Please see the history of present illness.    Review of Systems  Constitutional:  Positive for malaise/fatigue and weight loss.  HENT: Negative.    Respiratory: Negative.    Cardiovascular: Negative.   Gastrointestinal:  Positive for abdominal pain.  Musculoskeletal: Negative.   Neurological: Negative.   Psychiatric/Behavioral: Negative.    All other systems reviewed and are negative.    Labs/Other Tests and Data Reviewed:    Recent Labs: 06/17/2021: ALT 12; BUN 15; Creatinine, Ser 0.94; Hemoglobin 14.8; Platelets 230.0; Potassium 4.4; Sodium 138; TSH 1.23   Recent Lipid Panel Lab Results  Component Value Date/Time   CHOL 297 (H) 06/17/2021 03:09 PM   TRIG 169.0 (H) 06/17/2021 03:09 PM   HDL 44.00 06/17/2021 03:09 PM   CHOLHDL 7 06/17/2021 03:09 PM   LDLCALC 220 (H) 06/17/2021 03:09 PM   LDLDIRECT 144.0 10/14/2020  02:07 PM    Wt Readings from Last 3 Encounters:  04/12/22 175 lb (79.4 kg)  11/29/21 161 lb 12.8 oz (73.4 kg)  10/05/21 157 lb (71.2 kg)     Exam:    Vital Signs: Vital signs may also be detailed in the HPI BP (!) 140/80 (BP Location: Left Arm, Patient Position: Sitting, Cuff Size: Normal)   Pulse 75   Ht 5' 6"  (1.676 m)   Wt 175 lb (79.4 kg)   LMP 06/15/1977   SpO2 98%   BMI 28.25 kg/m   Constitutional:  oriented to person, place, and time. No distress.  HENT:  Head: Grossly normal Eyes:  no discharge. No scleral icterus.  Neck: No JVD, no carotid bruits  Cardiovascular: Regular rate and rhythm, no murmurs appreciated Pulmonary/Chest: Clear to auscultation bilaterally, no wheezes or rails Abdominal: Soft.  no distension.  no tenderness.  Musculoskeletal: Normal range of motion Neurological:  normal muscle tone. Coordination normal. No atrophy Skin: Skin warm and dry Psychiatric: normal affect, pleasant  ASSESSMENT & PLAN:    Problem List Items Addressed This Visit       Cardiology Problems   Carotid artery disease ([place])   Relevant Orders   EKG 12-Lead   PAD (peripheral artery disease) ([place])   Relevant Orders   EKG 12-Lead   Stroke ([place])   Hypertension   Relevant Orders   EKG 12-Lead   Hypercholesterolemia   CAD (coronary artery disease) - Primary   Relevant Orders   EKG 12-Lead   Other Visit Diagnoses     Smoker       Myalgia due to statin         Cad with stable angina Currently with no symptoms of angina. No further workup at this time. Continue current medication regimen. Does not want a statin given prior history of transaminitis, myalgias Tolerating Zetia 10 mg daily,  Does not want PCSK9 inhibitor injection  PAD Smoking 1 pack/day Followed by vascular Followed by Dr. Delana Meyer, severe carotid disease, cerebrovascular disease, prior strokes  Hyperlipidemia Management as above with Zetia, Does not want a statin or PCSK9 inhibitor or other  injections or medications  Chronic diarrhea/Crohn's Followed by GI Symptoms stable She is concerned symptoms exacerbated by statins  HTN Recommended she change metoprolol to tartrate 25 mg once a day to metoprolol succinate 25 mg once a day in the  evening    Total encounter time more than 30 minutes  Greater than 50% was spent in counseling and coordination of care with the patient   Signed, Ida Rogue, [place] [id number], [place], [place] [address]

## 2022-04-12 ENCOUNTER — Encounter: Payer: Self-pay | Admitting: Cardiovascular Disease

## 2022-04-12 ENCOUNTER — Ambulatory Visit: Payer: Medicare Other | Attending: Cardiovascular Disease | Admitting: Cardiovascular Disease

## 2022-04-12 VITALS — BP 140/80 | HR 75 | Ht 66.0 in | Wt 175.0 lb

## 2022-04-12 DIAGNOSIS — I6523 Occlusion and stenosis of bilateral carotid arteries: Secondary | ICD-10-CM | POA: Diagnosis not present

## 2022-04-12 DIAGNOSIS — I639 Cerebral infarction, unspecified: Secondary | ICD-10-CM | POA: Diagnosis not present

## 2022-04-12 DIAGNOSIS — F17200 Nicotine dependence, unspecified, uncomplicated: Secondary | ICD-10-CM

## 2022-04-12 DIAGNOSIS — I25118 Atherosclerotic heart disease of native coronary artery with other forms of angina pectoris: Secondary | ICD-10-CM | POA: Diagnosis not present

## 2022-04-12 DIAGNOSIS — I739 Peripheral vascular disease, unspecified: Secondary | ICD-10-CM | POA: Diagnosis not present

## 2022-04-12 DIAGNOSIS — E7800 Pure hypercholesterolemia, unspecified: Secondary | ICD-10-CM

## 2022-04-12 DIAGNOSIS — M791 Myalgia, unspecified site: Secondary | ICD-10-CM | POA: Diagnosis not present

## 2022-04-12 DIAGNOSIS — T466X5A Adverse effect of antihyperlipidemic and antiarteriosclerotic drugs, initial encounter: Secondary | ICD-10-CM

## 2022-04-12 DIAGNOSIS — I10 Essential (primary) hypertension: Secondary | ICD-10-CM

## 2022-04-12 DIAGNOSIS — T466X5D Adverse effect of antihyperlipidemic and antiarteriosclerotic drugs, subsequent encounter: Secondary | ICD-10-CM

## 2022-04-12 MED ORDER — METOPROLOL SUCCINATE ER 25 MG PO TB24: 50.0000 mg | ORAL_TABLET | Freq: Every day | ORAL | 3 refills | Status: DC

## 2022-04-12 NOTE — Patient Instructions (Signed)
Medication Instructions:  Hold the metoprolol tartrate Start metoprolol succinate 25 mg at dinner (pm)  If you need a refill on your cardiac medications before your next appointment, please call your pharmacy.   Lab work: No new labs needed  Testing/Procedures: No new testing needed  Follow-Up: At [facility], you and your health needs are our priority.  As part of our continuing mission to provide you with exceptional heart care, we have created designated Provider Care Teams.  These Care Teams include your primary Cardiologist (physician) and Advanced Practice Providers (APPs -  Physician Assistants and Nurse Practitioners) who all work together to provide you with the care you need, when you need it.  You will need a follow up appointment in 12 months  Providers on your designated Care Team:   Murray Hodgkins, NP Christell Faith, PA-C Cadence Kathlen Mody, [place]  COVID-19 Vaccine Information can be found at: [email or web address].uk For questions related to vaccine distribution or appointments, please email vaccine@ [email or web address] or call [phone].

## 2022-04-28 ENCOUNTER — Other Ambulatory Visit: Payer: Self-pay | Admitting: Cardiovascular Disease

## 2022-05-26 ENCOUNTER — Other Ambulatory Visit (INDEPENDENT_AMBULATORY_CARE_PROVIDER_SITE_OTHER): Payer: Self-pay | Admitting: Vascular Surgery

## 2022-05-26 DIAGNOSIS — I739 Peripheral vascular disease, unspecified: Secondary | ICD-10-CM

## 2022-05-26 DIAGNOSIS — I6523 Occlusion and stenosis of bilateral carotid arteries: Secondary | ICD-10-CM

## 2022-06-01 NOTE — Progress Notes (Signed)
MRN : [id number]  Haley Santos is a 77 y.o. (10/17/1945) female who presents with chief complaint of check circulation.  History of Present Illness:   The patient is seen for follow up evaluation of carotid stenosis status post CT angiogram. CT scan was done 05/27/2020. Patient reports that the test went well with no problems or complications.    The patient denies interval amaurosis fugax. There is no recent or interval TIA symptoms or focal motor deficits. There is no prior documented CVA.   The patient is taking enteric-coated aspirin 81 mg daily.   There is no history of migraine headaches. There is no history of seizures.   The patient has a history of coronary artery disease, no recent episodes of angina or shortness of breath. The patient denies PAD or claudication symptoms. There is a history of hyperlipidemia which is being treated with a statin.     Previous CT angiogram dated 05/27/2021 shows 50% stenosis consistent with calcified plaque at the origin of the left internal carotid artery.  There is also a 60% stenosis of the origin of the left common carotid.   Of note the duplex ultrasound today  shows RICA 1-39% with moderate common carotid stenosis and LICA 70-96% (the velocities of the common carotid arteries are similar bilaterally suggesting the origin lesion of the left common carotid is not yet significant.).  No significant change compared to last study.  ABI Rt=0.95 and Lt=0.95  No outpatient medications have been marked as taking for the 06/02/22 encounter (Appointment) with Delana Meyer, Dolores Lory, MD.    Past Medical History:  Diagnosis Date   Asthma with bronchitis    Breast cancer ([place]) 2014   CAD (coronary artery disease)    a. 08/2014: inf STEMI s/p DES to RCA.    COPD (chronic obstructive pulmonary disease) (HCC)    Crohn's disease (HCC)    Dyspnea    DOE   GERD (gastroesophageal reflux disease)    Glaucoma    Glucose intolerance  (impaired glucose tolerance)    a. HgA1c 6.2 during 08/2014 admission    Hypercholesterolemia    Hypertension    Hyperthyroidism    a. s/p ablation maintained on synthroid   Hypothyroidism    Multiple sclerosis ([place])    Myocardial infarction ([place])    2016   Neuromuscular disorder ([place])    MS   Stroke [facility])    November 2022   Tobacco abuse     Past Surgical History:  Procedure Laterality Date   AUGMENTATION MAMMAPLASTY Left    BREAST ENHANCEMENT SURGERY  1981   [place]   CATARACT EXTRACTION W/PHACO Left 07/19/2017   Procedure: CATARACT EXTRACTION PHACO AND INTRAOCULAR LENS PLACEMENT ([place]);  Surgeon: Birder Robson, MD;  Location: [facility] ORS;  Service: Ophthalmology;  Laterality: Left;  [place] 00:49.0 AP% 14.5 CDE 7.10 Fluid Pack Lot # [id number]   CATARACT EXTRACTION W/PHACO Right 08/09/2017   Procedure: CATARACT EXTRACTION PHACO AND INTRAOCULAR LENS PLACEMENT ([facility]);  Surgeon: Birder Robson, MD;  Location: [facility] ORS;  Service: Ophthalmology;  Laterality: Right;  [place] 00:42 AP% 13.8 CDE 5.88 Fluid pack lot #[id number]   COLONOSCOPY  2010   Dr Vira Agar   CORONARY ANGIOPLASTY     STENT 2016   LEFT HEART CATHETERIZATION WITH CORONARY ANGIOGRAM N/A 09/01/2014   Procedure: LEFT HEART CATHETERIZATION WITH CORONARY ANGIOGRAM;  Surgeon: Peter M Martinique, MD;  Location: [facility] CATH LAB;  Service: Cardiovascular;  Laterality: N/A;   MASTECTOMY Right 2014   PATRICAL MASTECOMY RIGHT RAD CHEMO   PORTACATH PLACEMENT  11/07/2012   RIGHT OOPHORECTOMY  1979   TONSILLECTOMY AND ADENOIDECTOMY  1957    Social History Social History   Tobacco Use   Smoking status: Every Day    Packs/day: 1.00    Years: 40.00    Total pack years: 40.00    Types: Cigarettes    Last attempt to quit: 07/01/2015    Years since quitting: 6.9   Smokeless tobacco: Never  Vaping Use   Vaping Use: Never used  Substance Use Topics   Alcohol use: Yes    Alcohol/week: 0.0 standard drinks of alcohol    Comment: RARE    Drug use: No    Family History Family History  Problem Relation Age of Onset   Diabetes Mother    Heart disease Mother        myocardial infarction   Glaucoma Mother    Berenice Primas' disease Mother    Heart attack Mother 93   Lung disease Father    Heart disease Other        half sister   Peripheral vascular disease Other        half sister   Breast cancer Neg Hx     Allergies  Allergen Reactions   Statins Other (See Comments)    Muscle weakness/pain. Trials of multiple agents. Tolerating rosuvastatin   Augmentin [Amoxicillin-Pot Clavulanate]     Pt reported had rash after taking.  (notifed 08/13/20 appt)   Clindamycin/Lincomycin Other (See Comments)    Digestive upset   Codeine Other (See Comments)    "hallucinations"   Shellfish Allergy Nausea And Vomiting    Felt goofy headed   Valium [Diazepam] Anxiety and Other (See Comments)    hyperactivity     REVIEW OF SYSTEMS (Negative unless checked)  Constitutional: '[]'$ Weight loss  '[]'$ Fever  '[]'$ Chills Cardiac: '[]'$ Chest pain   '[]'$ Chest pressure   '[]'$ Palpitations   '[]'$ Shortness of breath when laying flat   '[]'$ Shortness of breath with exertion. Vascular:  '[x]'$ Pain in legs with walking   '[]'$ Pain in legs at rest  '[]'$ History of DVT   '[]'$ Phlebitis   '[]'$ Swelling in legs   '[]'$ Varicose veins   '[]'$ Non-healing ulcers Pulmonary:   '[]'$ Uses home oxygen   '[]'$ Productive cough   '[]'$ Hemoptysis   '[]'$ Wheeze  '[]'$ COPD   '[]'$ Asthma Neurologic:  '[]'$ Dizziness   '[]'$ Seizures   '[]'$ History of stroke   '[]'$ History of TIA  '[]'$ Aphasia   '[]'$ Vissual changes   '[]'$ Weakness or numbness in arm   '[]'$ Weakness or numbness in leg Musculoskeletal:   '[]'$ Joint swelling   '[]'$ Joint pain   '[]'$ Low back pain Hematologic:  '[]'$ Easy bruising  '[]'$ Easy bleeding   '[]'$ Hypercoagulable state   '[]'$ Anemic Gastrointestinal:  '[]'$ Diarrhea   '[]'$ Vomiting  '[]'$ Gastroesophageal reflux/heartburn   '[]'$ Difficulty swallowing. Genitourinary:  '[]'$ Chronic kidney disease   '[]'$ Difficult urination  '[]'$ Frequent urination   '[]'$ Blood in urine Skin:   '[]'$ Rashes   '[]'$ Ulcers  Psychological:  '[]'$ History of anxiety   '[]'$  History of major depression.  Physical Examination  There were no vitals filed for this visit. There is no height or weight on file to calculate BMI. Gen: WD/WN, NAD Head: [place]/AT, No temporalis wasting.  Ear/Nose/Throat: Hearing grossly intact, nares w/o erythema or drainage Eyes: PER, EOMI, sclera nonicteric.  Neck: Supple, no masses.  No bruit or JVD.  Pulmonary:  Good air movement, no audible wheezing, no use of accessory muscles.  Cardiac: RRR, normal  S1, S2, no Murmurs. Vascular:  mild trophic changes, no open wounds Vessel Right Left  Radial Palpable Palpable  PT Trace Palpable Trace Palpable  DP Trace Palpable Trace Palpable  Gastrointestinal: soft, non-distended. No guarding/no peritoneal signs.  Musculoskeletal: M/S 5/5 throughout.  No visible deformity.  Neurologic: CN 2-12 intact. Pain and light touch intact in extremities.  Symmetrical.  Speech is fluent. Motor exam as listed above. Psychiatric: Judgment intact, Mood & affect appropriate for pt's clinical situation. Dermatologic: No rashes or ulcers noted.  No changes consistent with cellulitis.   CBC Lab Results  Component Value Date   WBC 8.2 06/17/2021   HGB 14.8 06/17/2021   HCT 43.5 06/17/2021   MCV 85.3 06/17/2021   PLT 230.0 06/17/2021    BMET    Component Value Date/Time   NA 138 06/17/2021 1509   NA 140 12/18/2020 1330   NA 139 05/01/2014 1008   K 4.4 06/17/2021 1509   K 4.0 05/01/2014 1008   CL 100 06/17/2021 1509   CL 102 05/01/2014 1008   CO2 29 06/17/2021 1509   CO2 30 05/01/2014 1008   GLUCOSE 102 (H) 06/17/2021 1509   GLUCOSE 107 (H) 05/01/2014 1008   BUN 15 06/17/2021 1509   BUN 7 (L) 12/18/2020 1330   BUN 13 05/01/2014 1008   CREATININE 0.94 06/17/2021 1509   CREATININE 0.91 05/01/2014 1008   CALCIUM 9.9 06/17/2021 1509   CALCIUM 9.1 05/01/2014 1008   GFRNONAA >60 12/21/2020 1319   GFRNONAA >60 05/01/2014 1008    GFRNONAA 57 (L) 01/29/2014 1021   GFRAA >60 07/04/2015 1445   GFRAA >60 05/01/2014 1008   GFRAA >60 01/29/2014 1021   CrCl cannot be calculated (Patient's most recent lab result is older than the maximum 21 days allowed.).  COAG No results found for: "INR", "PROTIME"  Radiology No results found.   Assessment/Plan 1. PAD (peripheral artery disease) (HCC)  Recommend:  The patient has evidence of atherosclerosis of the lower extremities with claudication.  The patient does not voice lifestyle limiting changes at this point in time.  Noninvasive studies do not suggest clinically significant change.  No invasive studies, angiography or surgery at this time The patient should continue walking and begin a more formal exercise program.  The patient should continue antiplatelet therapy and aggressive treatment of the lipid abnormalities  No changes in the patient's medications at this time  Continued surveillance is indicated as atherosclerosis is likely to progress with time.    The patient will continue follow up with noninvasive studies as ordered.  - VAS [place] ABI WITH/WO TBI; Future  2. Bilateral carotid artery stenosis Recommend:  Given the patient's asymptomatic subcritical stenosis no further invasive testing or surgery at this time.  Duplex ultrasound shows RICA 1-39% with moderate common carotid stenosis and LICA 30-86% .  Continue antiplatelet therapy as prescribed Continue management of CAD, HTN and Hyperlipidemia Healthy heart diet,  encouraged exercise at least 4 times per week Follow up in 12 months with duplex ultrasound and physical exam  - VAS US CAROTID; Future  3. Chronic venous insufficiency Recommend:  No surgery or intervention at this point in time.  I have reviewed my discussion with the patient regarding venous insufficiency and why it causes symptoms. I have discussed with the patient the chronic skin changes that accompany venous insufficiency and  the long term sequela such as ulceration. Patient will contnue wearing graduated compression stockings on a daily basis, as this has provided excellent control  of his edema. The patient will put the stockings on first thing in the morning and removing them in the evening. The patient is reminded not to sleep in the stockings.  In addition, behavioral modification including elevation during the day will be initiated. Exercise is strongly encouraged.  Previous duplex ultrasound of the lower extremities shows normal deep system, no significant superficial reflux was identified.  Given the patient's good control and lack of any problems regarding the venous insufficiency and lymphedema a lymph pump in not need at this time.    4. Coronary artery disease of native artery of native heart with stable angina pectoris (HCC) Continue cardiac and antihypertensive medications as already ordered and reviewed, no changes at this time.  Continue statin as ordered and reviewed, no changes at this time  Nitrates PRN for chest pain  5. Primary hypertension Continue antihypertensive medications as already ordered, these medications have been reviewed and there are no changes at this time.  6. Type 2 diabetes mellitus with hyperglycemia, without long-term current use of insulin (HCC) Continue hypoglycemic medications as already ordered, these medications have been reviewed and there are no changes at this time.  Hgb A1C to be monitored as already arranged by primary service     Hortencia Pilar, MD  06/01/2022 12:30 PM

## 2022-06-02 ENCOUNTER — Ambulatory Visit (INDEPENDENT_AMBULATORY_CARE_PROVIDER_SITE_OTHER): Payer: Medicare Other | Admitting: Vascular Surgery

## 2022-06-02 ENCOUNTER — Encounter (INDEPENDENT_AMBULATORY_CARE_PROVIDER_SITE_OTHER): Payer: Self-pay | Admitting: Vascular Surgery

## 2022-06-02 ENCOUNTER — Ambulatory Visit (INDEPENDENT_AMBULATORY_CARE_PROVIDER_SITE_OTHER): Payer: Medicare Other

## 2022-06-02 VITALS — BP 149/71 | HR 79 | Ht 66.0 in | Wt 175.0 lb

## 2022-06-02 DIAGNOSIS — I25118 Atherosclerotic heart disease of native coronary artery with other forms of angina pectoris: Secondary | ICD-10-CM | POA: Diagnosis not present

## 2022-06-02 DIAGNOSIS — E1165 Type 2 diabetes mellitus with hyperglycemia: Secondary | ICD-10-CM | POA: Diagnosis not present

## 2022-06-02 DIAGNOSIS — I739 Peripheral vascular disease, unspecified: Secondary | ICD-10-CM

## 2022-06-02 DIAGNOSIS — I6523 Occlusion and stenosis of bilateral carotid arteries: Secondary | ICD-10-CM

## 2022-06-02 DIAGNOSIS — I10 Essential (primary) hypertension: Secondary | ICD-10-CM

## 2022-06-02 DIAGNOSIS — I872 Venous insufficiency (chronic) (peripheral): Secondary | ICD-10-CM

## 2022-06-03 DIAGNOSIS — H401131 Primary open-angle glaucoma, bilateral, mild stage: Secondary | ICD-10-CM | POA: Diagnosis not present

## 2022-06-03 DIAGNOSIS — H02889 Meibomian gland dysfunction of unspecified eye, unspecified eyelid: Secondary | ICD-10-CM | POA: Diagnosis not present

## 2022-06-03 DIAGNOSIS — H26492 Other secondary cataract, left eye: Secondary | ICD-10-CM | POA: Diagnosis not present

## 2022-06-05 ENCOUNTER — Encounter (INDEPENDENT_AMBULATORY_CARE_PROVIDER_SITE_OTHER): Payer: Self-pay | Admitting: Vascular Surgery

## 2022-06-07 LAB — VAS US ABI WITH/WO TBI
Left ABI: 0.95
Right ABI: 0.95

## 2022-06-16 ENCOUNTER — Telehealth: Payer: Self-pay | Admitting: Cardiovascular Disease

## 2022-06-16 NOTE — Telephone Encounter (Signed)
Application for Disability Parking Placard was dropped off by the patient. Obtained from nurse bin and placed on Dr. Donivan Scull desk to review and sign.

## 2022-06-17 NOTE — Telephone Encounter (Signed)
Left message on patient's voicemail stating that the disability parking placard form has been filled out and signed. Stated that if not picked up by close of business today (06/17/22) that the form would be put in the mail and it could take about a week to receive.

## 2022-08-02 ENCOUNTER — Other Ambulatory Visit: Payer: Self-pay | Admitting: Internal Medicine

## 2022-11-03 ENCOUNTER — Other Ambulatory Visit: Payer: Self-pay | Admitting: Internal Medicine

## 2022-11-04 NOTE — Telephone Encounter (Signed)
I refilled x 1.  Needs f/u appt scheduled.

## 2022-12-21 ENCOUNTER — Emergency Department
Admission: EM | Admit: 2022-12-21 | Discharge: 2023-01-15 | Disposition: E | Payer: Medicare Other | Attending: Emergency Medicine | Admitting: Emergency Medicine

## 2022-12-21 ENCOUNTER — Encounter: Admission: EM | Disposition: E | Payer: Self-pay | Source: Home / Self Care | Attending: Emergency Medicine

## 2022-12-21 DIAGNOSIS — M79603 Pain in arm, unspecified: Secondary | ICD-10-CM | POA: Insufficient documentation

## 2022-12-21 DIAGNOSIS — I440 Atrioventricular block, first degree: Secondary | ICD-10-CM | POA: Diagnosis not present

## 2022-12-21 DIAGNOSIS — R464 Slowness and poor responsiveness: Secondary | ICD-10-CM | POA: Diagnosis not present

## 2022-12-21 DIAGNOSIS — R55 Syncope and collapse: Secondary | ICD-10-CM | POA: Diagnosis not present

## 2022-12-21 DIAGNOSIS — I469 Cardiac arrest, cause unspecified: Secondary | ICD-10-CM | POA: Diagnosis not present

## 2022-12-21 DIAGNOSIS — J449 Chronic obstructive pulmonary disease, unspecified: Secondary | ICD-10-CM | POA: Diagnosis not present

## 2022-12-21 DIAGNOSIS — I10 Essential (primary) hypertension: Secondary | ICD-10-CM | POA: Insufficient documentation

## 2022-12-21 DIAGNOSIS — I2510 Atherosclerotic heart disease of native coronary artery without angina pectoris: Secondary | ICD-10-CM | POA: Diagnosis not present

## 2022-12-21 DIAGNOSIS — I213 ST elevation (STEMI) myocardial infarction of unspecified site: Secondary | ICD-10-CM | POA: Diagnosis not present

## 2022-12-21 DIAGNOSIS — R6884 Jaw pain: Secondary | ICD-10-CM | POA: Insufficient documentation

## 2022-12-21 DIAGNOSIS — I499 Cardiac arrhythmia, unspecified: Secondary | ICD-10-CM | POA: Diagnosis not present

## 2022-12-21 DIAGNOSIS — I4430 Unspecified atrioventricular block: Secondary | ICD-10-CM | POA: Diagnosis not present

## 2022-12-21 SURGERY — Surgical Case

## 2022-12-21 MED ORDER — ETOMIDATE 2 MG/ML IV SOLN
INTRAVENOUS | Status: AC
  Filled 2022-12-21: qty 10

## 2022-12-21 MED ORDER — LORAZEPAM 2 MG/ML IJ SOLN
2.0000 mg | Freq: Once | INTRAMUSCULAR | Status: AC
  Administered 2022-12-21: 2 mg via INTRAVENOUS

## 2022-12-21 MED ORDER — DOPAMINE-DEXTROSE 3.2-5 MG/ML-% IV SOLN
0.0000 ug/kg/min | INTRAVENOUS | Status: DC
  Administered 2022-12-21: 10 ug/kg/min via INTRAVENOUS

## 2022-12-21 MED ORDER — ASPIRIN 81 MG PO CHEW: 324.0000 mg | CHEWABLE_TABLET | Freq: Once | ORAL | Status: DC

## 2022-12-21 MED ORDER — ETOMIDATE 2 MG/ML IV SOLN
INTRAVENOUS | Status: AC | PRN
Start: 2022-12-21 — End: 2022-12-21
  Administered 2022-12-21: 10 mg via INTRAVENOUS

## 2022-12-21 MED ORDER — AMIODARONE HCL IN DEXTROSE 360-4.14 MG/200ML-% IV SOLN: 30.0000 mg/h | INTRAVENOUS | Status: DC

## 2022-12-21 MED ORDER — PROPOFOL 1000 MG/100ML IV EMUL
INTRAVENOUS | Status: AC
  Filled 2022-12-21: qty 100

## 2022-12-21 MED ORDER — SUCCINYLCHOLINE CHLORIDE 200 MG/10ML IV SOSY
PREFILLED_SYRINGE | INTRAVENOUS | Status: AC
  Filled 2022-12-21: qty 10

## 2022-12-21 MED ORDER — AMIODARONE IV BOLUS ONLY 150 MG/100ML
INTRAVENOUS | Status: AC | PRN
  Administered 2022-12-21: 300 mg via INTRAVENOUS

## 2022-12-21 MED ORDER — ATROPINE SULFATE 1 MG/10ML IJ SOSY
PREFILLED_SYRINGE | INTRAMUSCULAR | Status: AC | PRN
  Administered 2022-12-21: 1 mg via INTRAVENOUS

## 2022-12-21 MED ORDER — SODIUM CHLORIDE 0.9 % IV SOLN: INTRAVENOUS | Status: DC

## 2022-12-21 MED ORDER — MORPHINE SULFATE (PF) 4 MG/ML IV SOLN
INTRAVENOUS | Status: AC
  Filled 2022-12-21: qty 1

## 2022-12-21 MED ORDER — MORPHINE SULFATE (PF) 4 MG/ML IV SOLN
INTRAVENOUS | Status: AC
  Filled 2022-12-21: qty 2

## 2022-12-21 MED ORDER — AMIODARONE HCL IN DEXTROSE 360-4.14 MG/200ML-% IV SOLN
60.0000 mg/h | INTRAVENOUS | Status: DC
  Administered 2022-12-21 (×2): 60 mg/h via INTRAVENOUS

## 2022-12-21 MED ORDER — EPINEPHRINE 1 MG/10ML IJ SOSY
PREFILLED_SYRINGE | INTRAMUSCULAR | Status: AC | PRN
Start: 2022-12-21 — End: 2022-12-21
  Administered 2022-12-21 (×2): 1 mg via INTRAVENOUS

## 2022-12-21 MED ORDER — EPINEPHRINE 1 MG/10ML IJ SOSY
PREFILLED_SYRINGE | INTRAMUSCULAR | Status: AC | PRN
  Administered 2022-12-21 (×2): 1 mg via INTRAVENOUS

## 2022-12-21 MED ORDER — SUCCINYLCHOLINE CHLORIDE 20 MG/ML IJ SOLN
INTRAMUSCULAR | Status: AC | PRN
Start: 2022-12-21 — End: 2022-12-21
  Administered 2022-12-21: 100 mg via INTRAVENOUS

## 2022-12-21 MED ORDER — MORPHINE SULFATE (PF) 4 MG/ML IV SOLN
8.0000 mg | Freq: Once | INTRAVENOUS | Status: AC
  Administered 2022-12-21: 8 mg via INTRAVENOUS

## 2022-12-21 MED ORDER — LORAZEPAM 2 MG/ML IJ SOLN
INTRAMUSCULAR | Status: AC
  Filled 2022-12-21: qty 1

## 2022-12-21 MED ORDER — MORPHINE SULFATE (PF) 4 MG/ML IV SOLN
4.0000 mg | Freq: Once | INTRAVENOUS | Status: AC
  Administered 2022-12-21: 4 mg via INTRAVENOUS

## 2022-12-27 ENCOUNTER — Telehealth: Payer: Self-pay | Admitting: Internal Medicine

## 2022-12-27 NOTE — Telephone Encounter (Signed)
Caller Name: Darolyn Rua Phone #:   [phone]  Date of death: [id number] Place of death: ED out patient  Time of death: 7:23 pm            (Please indicate AM/PM)    Autopsy performed: no [facility]eral [facility]: [employer] [facility]  Case # [id number]

## 2022-12-27 NOTE — Telephone Encounter (Signed)
I am assuming this is for the death certificate?

## 2022-12-28 NOTE — Telephone Encounter (Signed)
NOTED

## 2022-12-28 NOTE — Telephone Encounter (Signed)
Death certificate completed.

## 2023-01-15 NOTE — Code Documentation (Signed)
Patient time of death occurred at 9.

## 2023-01-15 NOTE — ED Notes (Signed)
Pulse found, temporarily, CPR resumed.

## 2023-01-15 NOTE — ED Notes (Signed)
MD went to speak with family at this time.

## 2023-01-15 NOTE — Progress Notes (Signed)
   12/16/2022 2000  Spiritual Encounters  Type of Visit Initial  Care provided to: [facility] partners present during encounter Nurse  Referral source Code page  Reason for visit Patient death  OnCall Visit Yes  Spiritual Framework  Presenting Themes Significant life change;Impactful experiences and emotions;Courage hope and growth  Community/Connection Family  Patient Stress Factors Loss  Family Stress Factors None identified  Interventions  Spiritual Care Interventions Made Compassionate presence;Supported grief process;Explored values/beliefs/practices/strengths;Provided grief education;Reflective listening;Decision-making support/facilitation  Intervention Outcomes  Outcomes Awareness around self/spiritual resourses;Awareness of support;Reduced isolation;Patient family open to resources  Spiritual Care Plan  Spiritual Care Issues Still Outstanding No further spiritual care needs at this time (see row info)   Chaplain received a page for a patient death. Chaplain went and met with the family of the patient and spoke with nurse. Chaplain spoke with the patient's daughter on the phone to get funeral home information. Family shared stories of the patient and their childhood growing up. Chaplain offered compassionate presence and offered encouraging words. Family stated that they were grateful for the chaplains support and care.

## 2023-01-15 NOTE — ED Notes (Signed)
Patient post mortem care completed at this time.

## 2023-01-15 NOTE — ED Notes (Signed)
After speaking with family stated patient wanted no heroic measures completed. Going to start withdrawing care at this time.

## 2023-01-15 NOTE — ED Provider Notes (Signed)
[facility] Provider Note    Event Date/Time   First MD Initiated Contact with Patient 12/23/2022 1757     (approximate)  History   Chief Complaint: Code STEMI and Cardiac Arrest  HPI  Haley Santos is a 77 y.o. female with a past medical history of CAD with a STEMI in 2016 with stent placed, COPD, Crohn's disease, hypertension, hyperlipidemia, prior CVA, MS, presents to the emergency department as a STEMI alert.  According to EMS the patient states after eating [place] food this afternoon she began experiencing chest pain nausea and vomiting.  EMS states upon their arrival they noted the patient to be weak appearing complaining of jaw pain as well as arm pain similar to her past MI per patient per EMS.  EKG performed by EMS was concerning for a STEMI and a STEMI alert was activated in the field.  Just before arrival to the emergency department patient went unresponsive lost pulses and CPR was started.  EMS states approximately 3 to 5 minutes of CPR as they arrived to the emergency department.  Physical Exam   Triage Vital Signs: ED Triage Vitals  Encounter Vitals Group     BP 01/11/2023 1744 105/80     Systolic BP Percentile --      Diastolic BP Percentile --      Pulse Rate 01/11/2023 1756 70     Resp 12/16/2022 1744 20     Temp --      Temp src --      SpO2 12/18/2022 1756 97 %     Weight --      Height --      Head Circumference --      Peak Flow --      Pain Score --      Pain Loc --      Pain Education --      Exclude from Growth Chart --     Most recent vital signs: Vitals:   01/07/2023 1900 12/25/2022 1908  BP:    Pulse:    Resp: 20 15  SpO2:      General: Unresponsive, CPR in progress 2 to 3 mm pupils very sluggishly reactive CV:  Pale in appearance, good pulse with compressions. Resp:  Occasional respirations, assisted with bag Abd:  No distention.   ED Results / Procedures / Treatments   EKG  EKG viewed and interpreted by myself (after  return of spontaneous circulation) shows what appears to be irregular rhythm possibly atrial fibrillation with a widened QRS complex, normal axis largely normal intervals diffuse ST changes but no ST elevation at least that would meet STEMI criteria.   MEDICATIONS ORDERED IN ED: Medications  0.9 %  sodium chloride infusion (has no administration in time range)  aspirin chewable tablet 324 mg (324 mg Oral Not Given 12/20/2022 1845)  propofol (DIPRIVAN) 1000 MG/100ML infusion (  Not Given 12/16/2022 1844)  DOPamine (INTROPIN) 800 mg in dextrose 5 % 250 mL (3.2 mg/mL) infusion (20 mcg/kg/min Intravenous Rate/Dose Change 12/23/2022 1805)  amiodarone (NEXTERONE PREMIX) 360-4.14 MG/200ML-% (1.8 mg/mL) IV infusion (60 mg/hr Intravenous New Bag/Given 12/22/2022 1806)  amiodarone (NEXTERONE PREMIX) 360-4.14 MG/200ML-% (1.8 mg/mL) IV infusion (has no administration in time range)  morphine (PF) 4 MG/ML injection (has no administration in time range)  LORazepam (ATIVAN) 2 MG/ML injection (has no administration in time range)  morphine (PF) 4 MG/ML injection (has no administration in time range)  amiodarone (NEXTERONE) 1.5 mg/mL IV bolus only (  0 mg Intravenous Stopped 01/02/2023 1808)  etomidate (AMIDATE) injection ( Intravenous Canceled Entry 12/25/2022 1800)  succinylcholine (ANECTINE) injection ( Intravenous Canceled Entry 01/14/2023 1800)  EPINEPHrine (ADRENALIN) 1 MG/10ML injection (1 mg Intravenous Given 12/30/2022 1801)  EPINEPHrine (ADRENALIN) 1 MG/10ML injection (1 mg Intravenous Given 01/11/2023 1804)  atropine 1 MG/10ML injection (1 mg Intravenous Given 12/16/2022 1754)  morphine (PF) 4 MG/ML injection 8 mg (8 mg Intravenous Given 01/01/2023 1844)  LORazepam (ATIVAN) injection 2 mg (2 mg Intravenous Given 01/08/2023 1850)  morphine (PF) 4 MG/ML injection 4 mg (4 mg Intravenous Given 12/29/2022 1856)     IMPRESSION / MDM / ASSESSMENT AND PLAN / ED COURSE  I reviewed the triage vital signs and the nursing notes.  Patient's presentation is  most consistent with acute presentation with potential threat to life or bodily function.  Patient presents to the emergency department as a code STEMI due to chest pain arm pain jaw pain starting shortly before arrival.  Patient lost pulses just prior to arrival.  On arrival to the emergency department CPR in progress and was taken over by ER staff on arrival.  Good pulse with compressions.  During first pulse check patient noted to be in ventricular fibrillation.  Several rounds of medications such as epinephrine and defibrillations continued.  Patient was given 300 mg amiodarone bolus.  Shortly after arrival to the emergency department cardiologist Dr. Darrold Junker was in the room as well.  Patient did regain pulses initially for approximately 5 minutes before the patient went into ventricular tachycardia without a pulse patient was defibrillated and CPR initiated once again.  This continued for quite some time intermittently regaining pulses.  During this time I intubated the patient during one of her episodes with a spontaneous pulse.  Patient was started on a dopamine infusion as well as an amiodarone drip.  Continue to intermittently lose pulses during the resuscitation.  By this time family had arrived (a cousin) who states the patient is next of kin is a daughter who lives in [place] and they have been trying unsuccessfully so far to contact her.  They state that the patient was very clear with them that she did not want to be on life support and in fact had signed a DO NOT RESUSCITATE order.  At this time the patient is intubated and is maintaining a pulse although with low blood pressure around 60-70 systolic despite being on dopamine.  We were able to reach the patient's daughter in [place] Haley Santos).  After a discussion with the daughter she confirms that the patient would not want to be on life support would not want to be resuscitated and in fact had signed a DNR order.  Given the patient was already  intubated although at this point has fixed and dilated pupils, I discussed the options with the daughter as far as admitting to the hospital for comfort care, continuing with full support including ventilator and pressors or extubating in the emergency department.  After very long discussion daughter states that the patient would not want to be on life support would not want to be intubated did not want advanced measures taken.  States she had been suffering from Crohn's disease among other conditions for quite some time including multiple CVAs and a prior heart attack.  She asked that we extubate the patient.  As the patient was occasionally taken agonal respirations upon extubation the patient was given a dose of morphine and Ativan.  Patient remained extremely bradycardic around 30  to 40 bpm with a very very faint pulse shortly after extubation however pulse was lost and patient expired at 7:23 PM.  I updated the family members who are here in the emergency department and they stated they would talk to the patient's daughter.  INTUBATION Performed by: Minna Antis  Required items: required blood products, implants, devices, and special equipment available Patient identity confirmed: provided demographic data and hospital-assigned identification number Time out: Immediately prior to procedure a "time out" was called to verify the correct patient, procedure, equipment, support staff and site/side marked as required.  Indications: Cardiac arrest, respiratory failure  Intubation method: S4 Glidescope Laryngoscopy   Preoxygenation: 100% BVM  Sedatives: 10 mg etomidate Paralytic: 100 mg succinylcholine  Tube Size: 7.5 cuffed  Post-procedure assessment: chest rise and ETCO2 monitor Breath sounds: equal and absent over the epigastrium Tube secured with: ETT holder  Cardiopulmonary Resuscitation (CPR) Procedure Note Directed/Performed by: Minna Antis I personally directed ancillary  staff and/or performed CPR in an effort to regain return of spontaneous circulation and to maintain cardiac, neuro and systemic perfusion.   CRITICAL CARE Performed by: Minna Antis   Total critical care time: 45 minutes  Critical care time was exclusive of separately billable procedures and treating other patients.  Critical care was necessary to treat or prevent imminent or life-threatening deterioration.  Critical care was time spent personally by me on the following activities: development of treatment plan with patient and/or surrogate as well as nursing, discussions with consultants, evaluation of patient's response to treatment, examination of patient, obtaining history from patient or surrogate, ordering and performing treatments and interventions, ordering and review of laboratory studies, ordering and review of radiographic studies, pulse oximetry and re-evaluation of patient's condition.     FINAL CLINICAL IMPRESSION(S) / ED DIAGNOSES   Cardiac arrest    Note:  This document was prepared using Dragon voice recognition software and may include unintentional dictation errors.   Minna Antis, MD 12/22/2022 2023

## 2023-01-15 NOTE — ED Notes (Addendum)
Patient extubated at this time.

## 2023-01-15 NOTE — ED Notes (Signed)
Patient coded CPR in progress by EMS.

## 2023-01-15 NOTE — ED Notes (Signed)
Called and notified daughter at this time of patient passing away.

## 2023-01-15 NOTE — ED Notes (Signed)
NSR with occasional PVC, MD at bedside

## 2023-01-15 NOTE — Code Documentation (Addendum)
Dr. Cassie Freer, Cardiology at bedside, PEA CPR resumed

## 2023-01-15 NOTE — ED Notes (Addendum)
Vfib, Shock 200J, asystole.

## 2023-01-15 NOTE — Progress Notes (Signed)
Called for code STEMI.  Patient brought in via EMS, ECG revealed ST elevations in the inferior leads.  In the emergency room, patient required CPR and multiple rounds of ACLS, with recurrent VF, multiple shocks.  Patient currently on dopamine drip, amiodarone drip, with blood pressure 60/20, pupils fixed and dilated.  I had lengthy discussion with family members, who report that the patient had been experiencing poor health, with multiple strokes, and debilitating Crohn's disease, and did not wish for heroic measures.  Code STEMI canceled.

## 2023-01-15 NOTE — ED Notes (Signed)
Called [facility] at this time. They state patient is eligible for eye and tissue donation.

## 2023-01-15 NOTE — ED Triage Notes (Addendum)
Pt came in via [facility] as NSTEMI patient coded in ambulance bay and pt had CPR in progress by EMS. Pt was eating [place] food when had sudden pain in both sides of jaw and arms. Hx of heart attack in 2016 she said felt similar to pain was feeling on EMS arrival and had stent placed at that time. Hx of stroke, crohn's disease.  Per EMS pt takes plavix. A&Ox4 until coding.  Was given ASA by EMS.

## 2023-01-15 DEATH — deceased

## 2023-06-01 ENCOUNTER — Ambulatory Visit (INDEPENDENT_AMBULATORY_CARE_PROVIDER_SITE_OTHER): Payer: Medicare Other | Admitting: Vascular Surgery

## 2023-06-01 ENCOUNTER — Encounter (INDEPENDENT_AMBULATORY_CARE_PROVIDER_SITE_OTHER): Payer: Medicare Other
